# Patient Record
Sex: Male | Born: 1955 | Race: Black or African American | Hispanic: No | Marital: Single | Smoking: Current every day smoker
Health system: Southern US, Community
[De-identification: ages and names within clinical notes are randomized; demographics above are authoritative.]

## PROBLEM LIST (undated history)

## (undated) ENCOUNTER — Ambulatory Visit (HOSPITAL_COMMUNITY): Admission: EM | Disposition: A | Discharge status: Home / Self Care | Payer: 59

## (undated) DIAGNOSIS — Z89511 Acquired absence of right leg below knee: Secondary | ICD-10-CM

## (undated) DIAGNOSIS — F1010 Alcohol abuse, uncomplicated: Secondary | ICD-10-CM

## (undated) DIAGNOSIS — I82409 Acute embolism and thrombosis of unspecified deep veins of unspecified lower extremity: Secondary | ICD-10-CM

## (undated) DIAGNOSIS — I10 Essential (primary) hypertension: Secondary | ICD-10-CM

## (undated) DIAGNOSIS — E785 Hyperlipidemia, unspecified: Secondary | ICD-10-CM

## (undated) DIAGNOSIS — G40909 Epilepsy, unspecified, not intractable, without status epilepticus: Secondary | ICD-10-CM

## (undated) HISTORY — DX: Acquired absence of right leg below knee: Z89.511

## (undated) HISTORY — DX: Epilepsy, unspecified, not intractable, without status epilepticus: G40.909

## (undated) HISTORY — DX: Alcohol abuse, uncomplicated: F10.10

## (undated) HISTORY — PX: LEG AMPUTATION: SHX1105

## (undated) HISTORY — PX: BELOW KNEE LEG AMPUTATION: SUR23

---

## 2005-04-15 ENCOUNTER — Emergency Department: Payer: Self-pay | Admitting: Emergency Medicine

## 2006-01-12 ENCOUNTER — Emergency Department: Payer: Self-pay | Admitting: Emergency Medicine

## 2006-02-26 ENCOUNTER — Emergency Department (HOSPITAL_COMMUNITY): Admission: EM | Admit: 2006-02-26 | Discharge: 2006-02-26 | Payer: Self-pay | Admitting: Emergency Medicine

## 2006-03-07 ENCOUNTER — Emergency Department (HOSPITAL_COMMUNITY): Admission: EM | Admit: 2006-03-07 | Discharge: 2006-03-07 | Payer: Self-pay | Admitting: Emergency Medicine
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
## 2006-08-22 ENCOUNTER — Emergency Department: Payer: Self-pay | Admitting: Emergency Medicine

## 2009-05-10 ENCOUNTER — Inpatient Hospital Stay: Payer: Self-pay | Admitting: Vascular Surgery

## 2009-05-10 IMAGING — CR RIGHT ANKLE - 2 VIEW
1 series · 2 of 2 positions shown · non-contrast
Comparison: none

REASON FOR EXAM: ankle pain
COMMENTS:

[Series 1: view not recorded · 0.17mm/px · 2 of 2 slices shown]
[im 1/2]
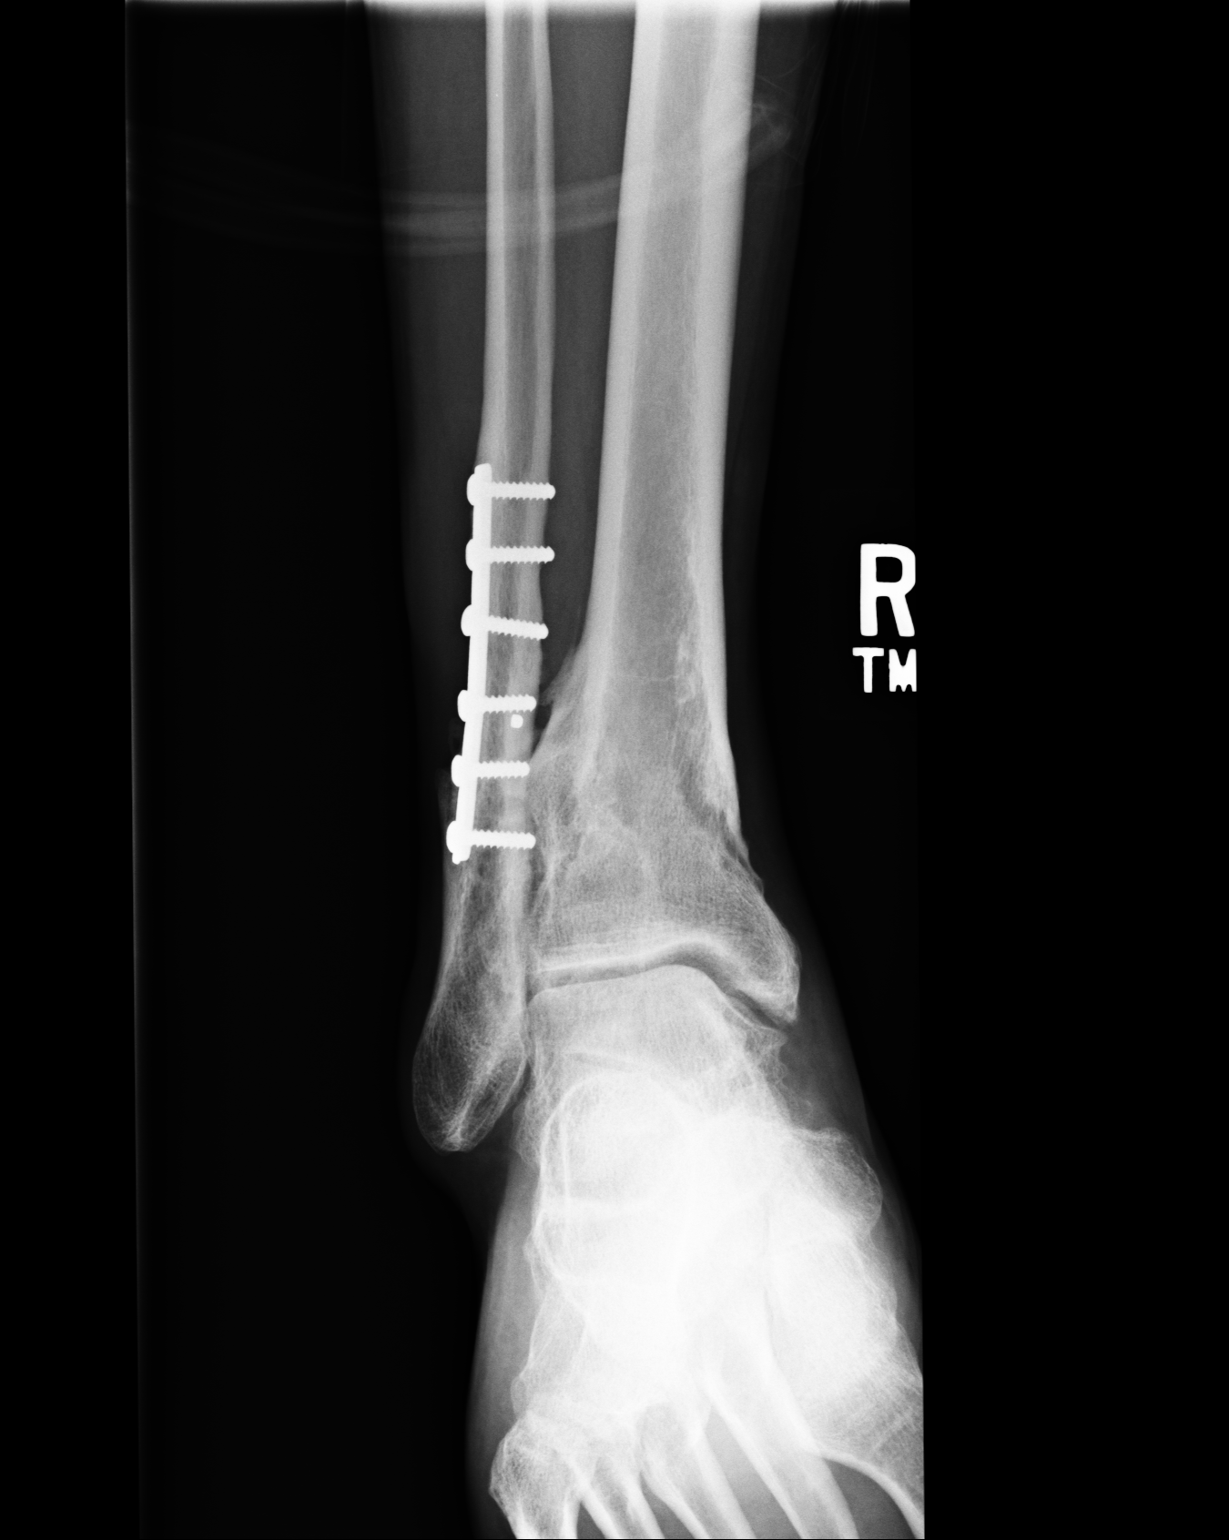
[im 2/2]
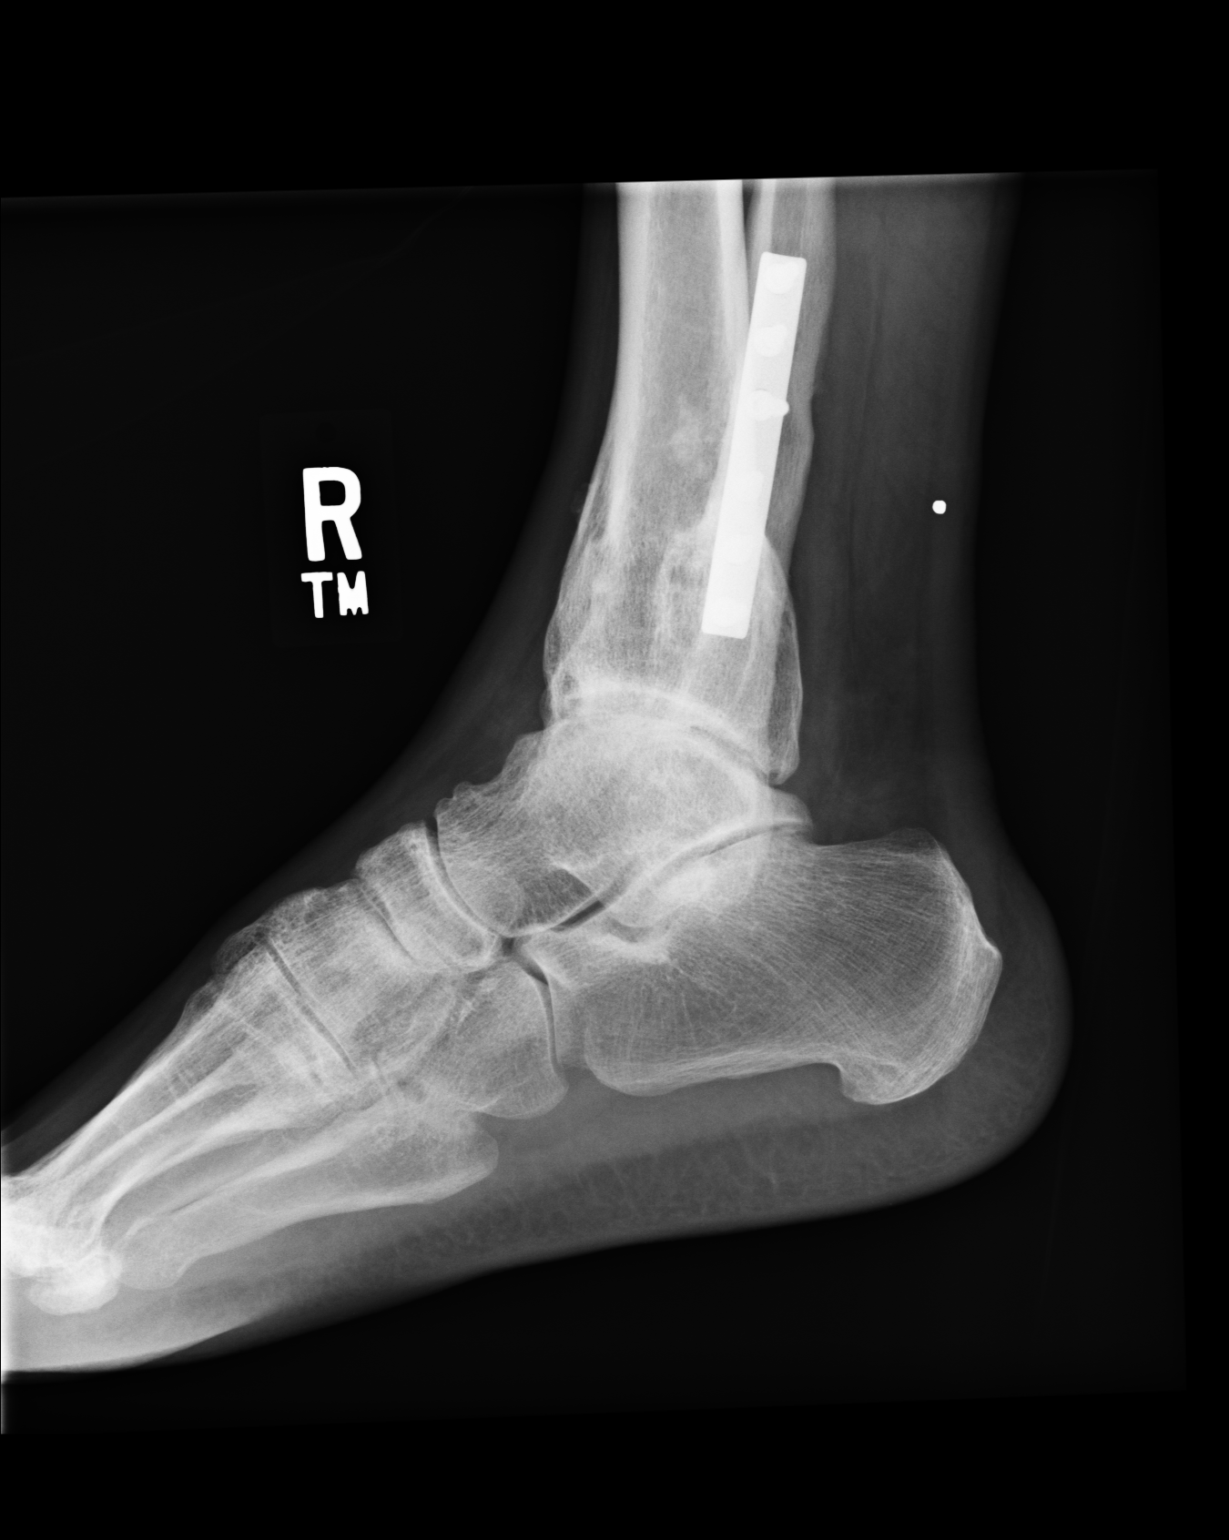

[2 of 2 positions shown; findings below may reference images not displayed]

PROCEDURE:     DXR - DXR ANKLE RIGHT AP AND LATERAL  - [DATE]  [DATE]

RESULT:     Images of the right ankle demonstrate previous fixation along
the lateral aspect of the distal fibula with a plate. There is an old
healing fracture in the distal right tibia. This is incompletely healed.
Degenerative changes are noted at the talotibial joint.
IMPRESSION: Post surgical and post traumatic changes as described.

## 2009-05-10 IMAGING — XA IR VASCULAR PROCEDURE
15 of 24 series · 15 of 24 positions shown · IV contrast (IODINE)
Comparison: none

[Series 1: aorta · 1 of 2 slices shown (1 of 11)]
[im 1/2]
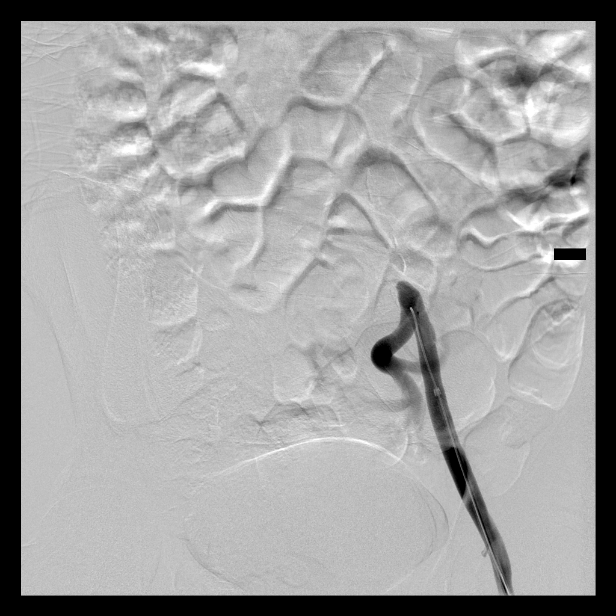

[Series 3: aorta · 1 of 4 slices shown (2 of 11)]
[im 1/4]
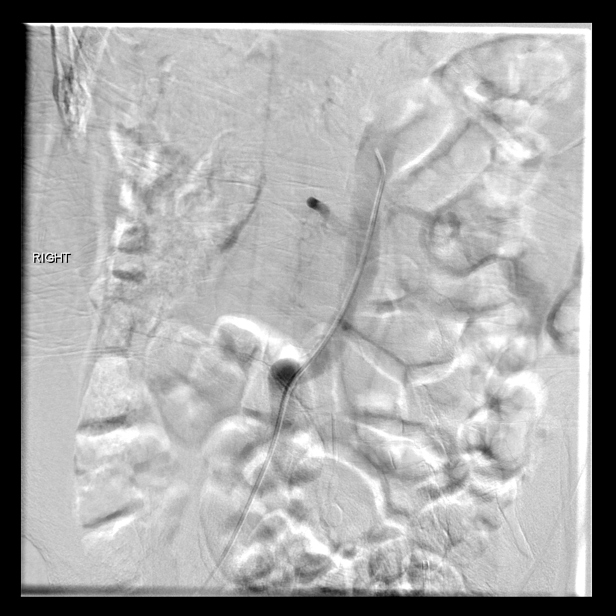

[Series 5: aorta · 1 of 4 slices shown (3 of 11)]
[im 1/4]
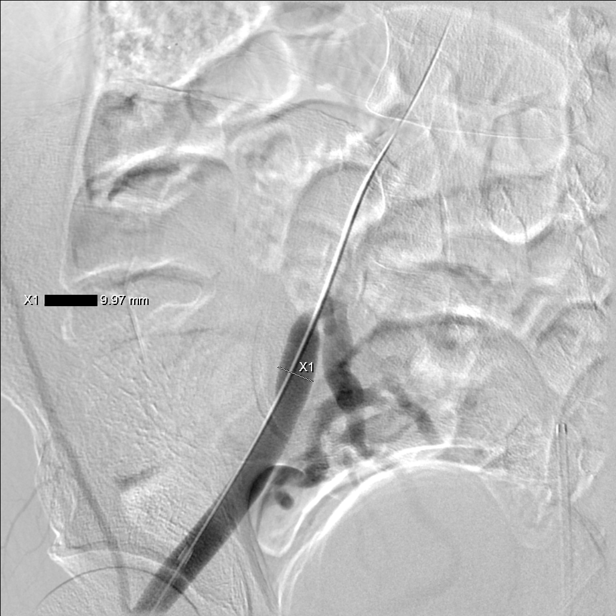

[Series 6: fl  angio · 1 of 2 slices shown (1 of 4)]
[im 1/2]
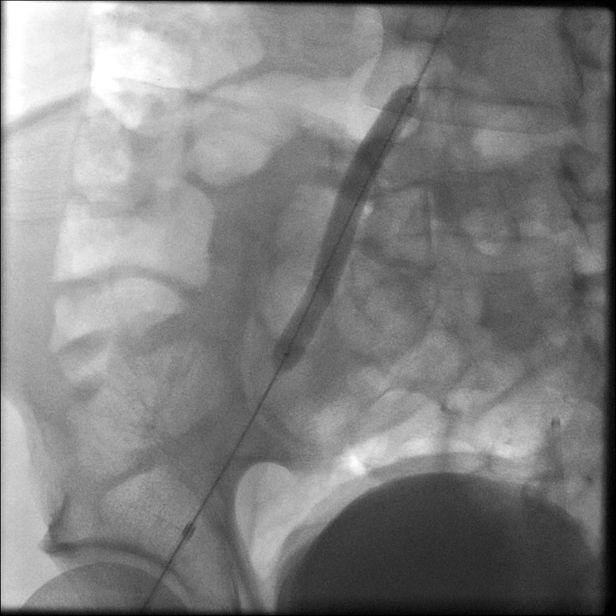

[Series 8: aorta · 1 of 2 slices shown (4 of 11)]
[im 1/2]
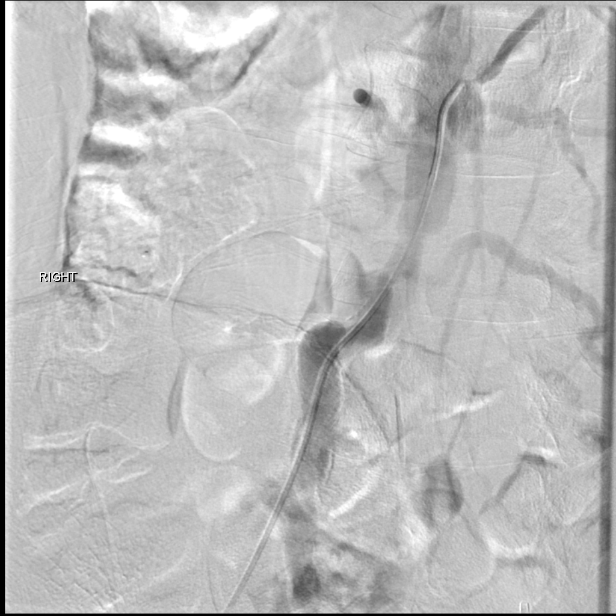

[Series 9: fl  angio · 1 of 1 slices shown (2 of 4)]
[im 1/1]
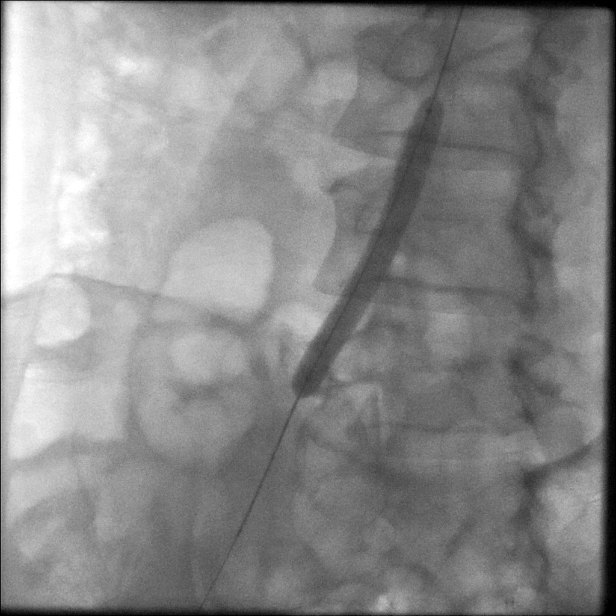

[Series 12: aorta · 1 of 2 slices shown (5 of 11)]
[im 1/2]
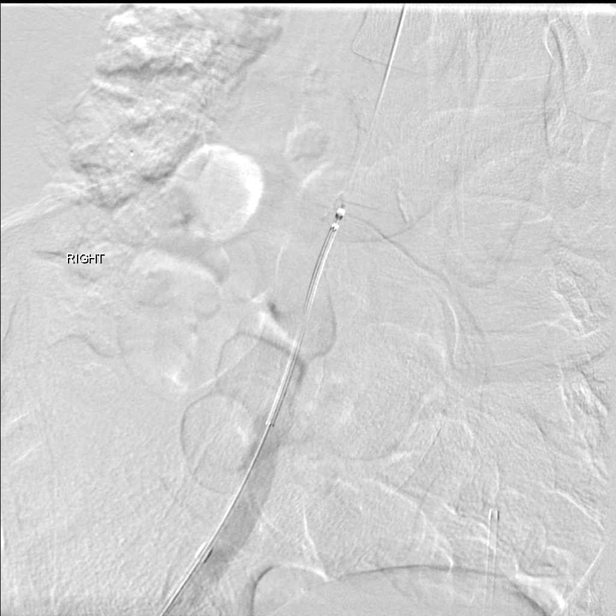

[Series 14: fl  angio · 1 of 1 slices shown (3 of 4)]
[im 1/1]
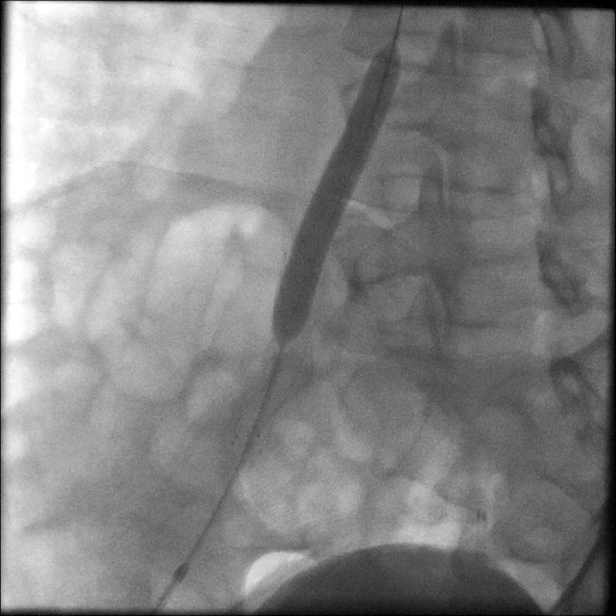

[Series 15: aorta · 1 of 2 slices shown (6 of 11)]
[im 1/2]
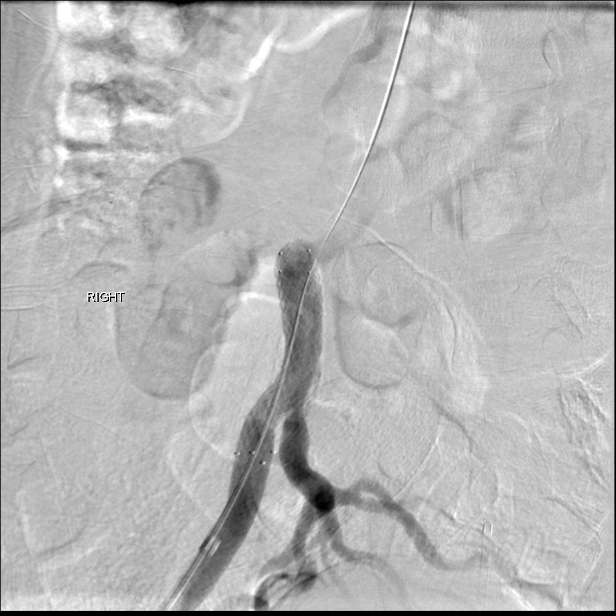

[Series 17: aorta · 1 of 2 slices shown (7 of 11)]
[im 1/2]
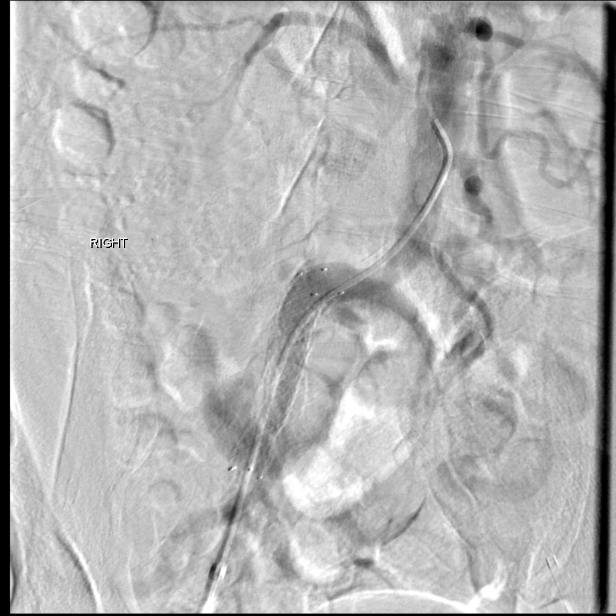

[Series 18: aorta · 1 of 2 slices shown (8 of 11)]
[im 1/2]
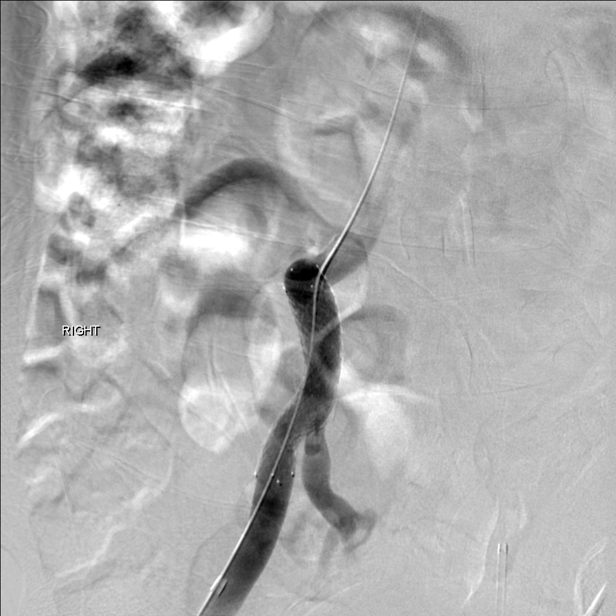

[Series 20: fl  angio · 1 of 1 slices shown (4 of 4)]
[im 1/1]
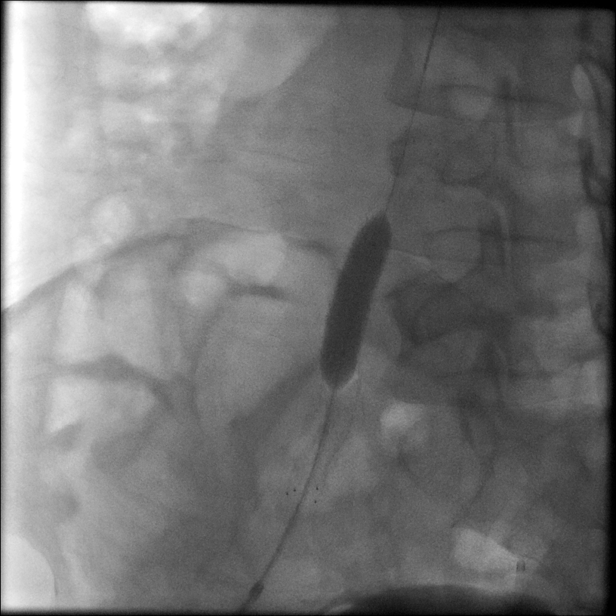

[Series 22: aorta · 1 of 2 slices shown (9 of 11)]
[im 1/2]
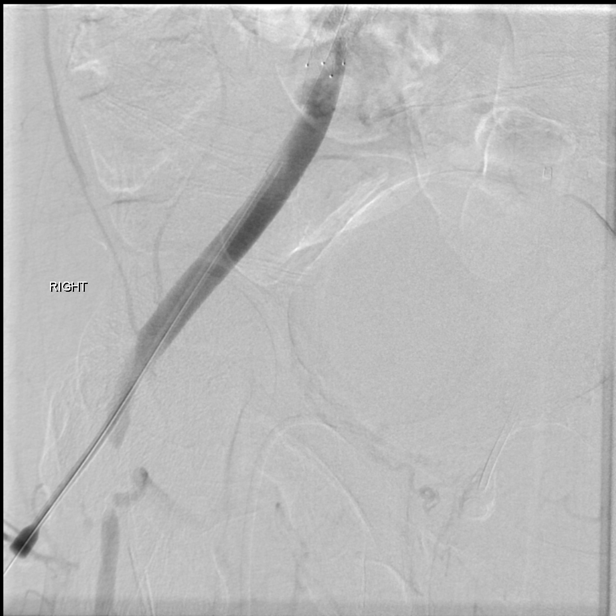

[Series 23: aorta · 1 of 2 slices shown (10 of 11)]
[im 1/2]
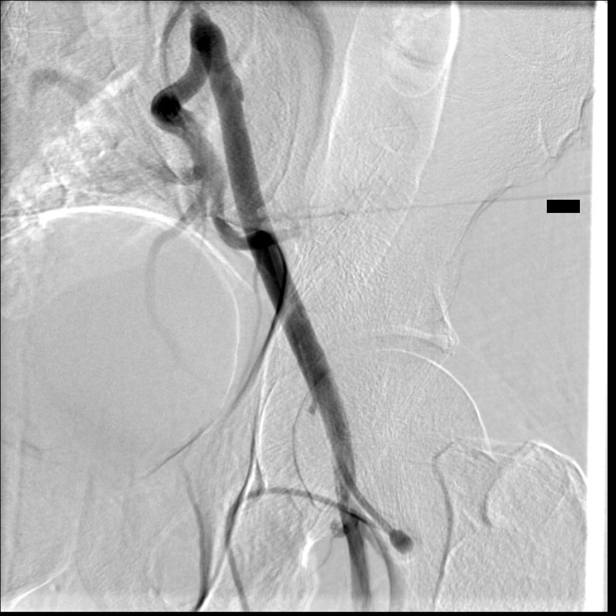

[Series 25: aorta · 1 of 2 slices shown (11 of 11)]
[im 1/2]
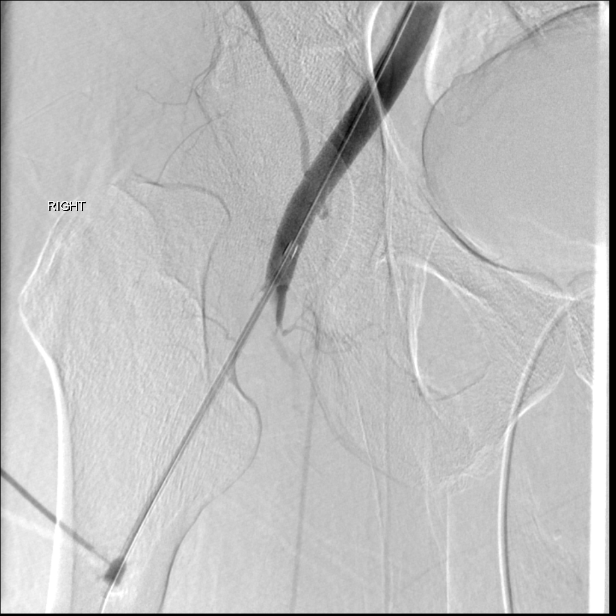

[15 of 24 positions shown; findings below may reference images not displayed]

IMAGES IMPORTED FROM THE [employer] WORKFLOW SYSTEM
NO DICTATION FOR STUDY

## 2010-03-10 ENCOUNTER — Emergency Department: Payer: Self-pay | Admitting: Unknown Physician Specialty

## 2010-03-10 IMAGING — CR DG CHEST 1V PORT
1 series · 1 of 1 positions shown · non-contrast
Comparison: none

REASON FOR EXAM: cough, crackles diffusely
COMMENTS:

[view not recorded]
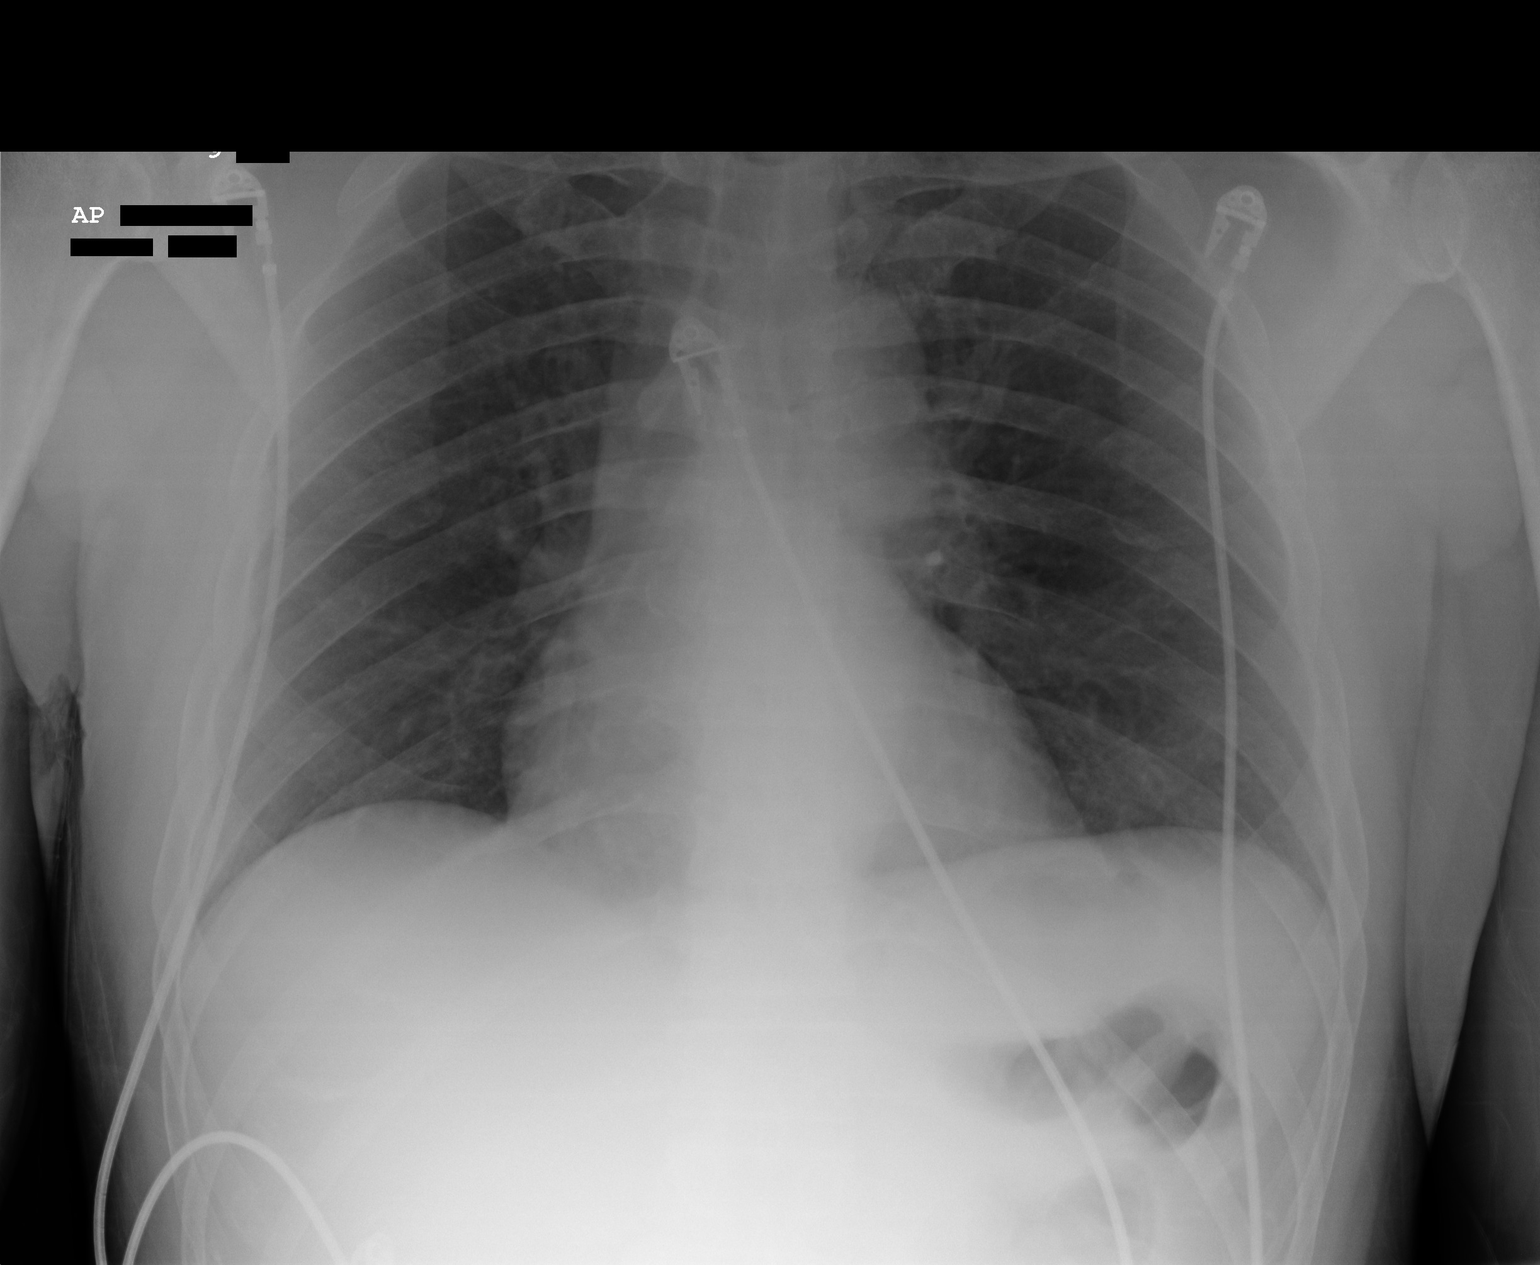

[1 of 1 positions shown; findings below may reference images not displayed]

PROCEDURE:     DXR - DXR PORTABLE CHEST SINGLE VIEW  - [DATE]  [DATE]

RESULT:     There is no previous exam for comparison.

Cardiac monitoring electrodes are present. The lungs are clear. The heart
and pulmonary vessels are normal. The bony and mediastinal structures are
unremarkable. There is no effusion. There is no pneumothorax or evidence of
congestive failure.
IMPRESSION: No acute cardiopulmonary disease.

## 2011-07-29 ENCOUNTER — Emergency Department: Payer: Self-pay | Admitting: Emergency Medicine

## 2011-07-29 LAB — COMPREHENSIVE METABOLIC PANEL WITH GFR
Albumin: 3.6 g/dL
Alkaline Phosphatase: 102 U/L
Anion Gap: 9
BUN: 5 mg/dL — ABNORMAL LOW
Bilirubin,Total: 0.1 mg/dL — ABNORMAL LOW
Calcium, Total: 8 mg/dL — ABNORMAL LOW
Chloride: 106 mmol/L
Co2: 27 mmol/L
Creatinine: 0.94 mg/dL
EGFR (African American): >60
EGFR (Non-African Amer.): >60
Glucose: 108 mg/dL — ABNORMAL HIGH
Osmolality: 281
Potassium: 3.7 mmol/L
SGOT(AST): 50 U/L — ABNORMAL HIGH
SGPT (ALT): 47 U/L
Sodium: 142 mmol/L
Total Protein: 8 g/dL

## 2011-07-29 LAB — ETHANOL
Ethanol %: 0.389 % (ref 0.000–0.080)
Ethanol: 389 mg/dL

## 2011-07-29 LAB — DRUG SCREEN, URINE
Amphetamines, Ur Screen: NEGATIVE
Barbiturates, Ur Screen: NEGATIVE
Benzodiazepine, Ur Scrn: NEGATIVE
Cannabinoid 50 Ng, Ur ~~LOC~~: NEGATIVE
Cocaine Metabolite,Ur ~~LOC~~: NEGATIVE
MDMA (Ecstasy)Ur Screen: NEGATIVE
Methadone, Ur Screen: NEGATIVE
Opiate, Ur Screen: NEGATIVE
Phencyclidine (PCP) Ur S: NEGATIVE
Tricyclic, Ur Screen: NEGATIVE

## 2011-07-29 LAB — ACETAMINOPHEN LEVEL: Acetaminophen: <2 ug/mL

## 2011-07-29 LAB — CBC
HCT: 46.5 % (ref 40.0–52.0)
HGB: 15.4 g/dL (ref 13.0–18.0)
MCV: 94 fL (ref 80–100)
RBC: 4.93 10*6/uL (ref 4.40–5.90)
RDW: 15.5 % — ABNORMAL HIGH (ref 11.5–14.5)

## 2011-07-29 LAB — TSH: Thyroid Stimulating Horm: 1.12 u[IU]/mL

## 2011-12-17 ENCOUNTER — Ambulatory Visit: Payer: Medicare Other | Attending: Psychiatry | Admitting: Physical Therapy

## 2011-12-17 DIAGNOSIS — R269 Unspecified abnormalities of gait and mobility: Secondary | ICD-10-CM | POA: Insufficient documentation

## 2011-12-17 DIAGNOSIS — S88119A Complete traumatic amputation at level between knee and ankle, unspecified lower leg, initial encounter: Secondary | ICD-10-CM | POA: Insufficient documentation

## 2011-12-17 DIAGNOSIS — IMO0001 Reserved for inherently not codable concepts without codable children: Secondary | ICD-10-CM | POA: Insufficient documentation

## 2011-12-17 DIAGNOSIS — R5381 Other malaise: Secondary | ICD-10-CM | POA: Insufficient documentation

## 2016-03-02 ENCOUNTER — Encounter (HOSPITAL_COMMUNITY): Payer: Self-pay | Admitting: Radiology

## 2016-03-02 ENCOUNTER — Emergency Department (HOSPITAL_COMMUNITY)
Admission: EM | Admit: 2016-03-02 | Discharge: 2016-03-02 | Disposition: A | Discharge status: Home / Self Care | Payer: Medicare Other | Attending: Emergency Medicine | Admitting: Emergency Medicine

## 2016-03-02 ENCOUNTER — Emergency Department (HOSPITAL_COMMUNITY): Payer: Medicare Other

## 2016-03-02 ENCOUNTER — Ambulatory Visit (HOSPITAL_COMMUNITY)
Admission: EM | Admit: 2016-03-02 | Discharge: 2016-03-02 | Disposition: A | Discharge status: Nursing Home (Includes ICF) | Payer: Medicare Other | Attending: Family Medicine | Admitting: Family Medicine

## 2016-03-02 ENCOUNTER — Encounter (HOSPITAL_COMMUNITY): Payer: Self-pay | Admitting: *Deleted

## 2016-03-02 DIAGNOSIS — I169 Hypertensive crisis, unspecified: Secondary | ICD-10-CM | POA: Diagnosis not present

## 2016-03-02 DIAGNOSIS — M545 Low back pain, unspecified: Secondary | ICD-10-CM

## 2016-03-02 DIAGNOSIS — M549 Dorsalgia, unspecified: Secondary | ICD-10-CM

## 2016-03-02 DIAGNOSIS — I639 Cerebral infarction, unspecified: Secondary | ICD-10-CM | POA: Diagnosis not present

## 2016-03-02 DIAGNOSIS — I10 Essential (primary) hypertension: Secondary | ICD-10-CM | POA: Diagnosis not present

## 2016-03-02 DIAGNOSIS — Z79899 Other long term (current) drug therapy: Secondary | ICD-10-CM | POA: Diagnosis not present

## 2016-03-02 HISTORY — DX: Essential (primary) hypertension: I10

## 2016-03-02 LAB — URINALYSIS, ROUTINE W REFLEX MICROSCOPIC
BILIRUBIN URINE: NEGATIVE
Glucose, UA: NEGATIVE mg/dL
Hgb urine dipstick: NEGATIVE
KETONES UR: NEGATIVE mg/dL
NITRITE: NEGATIVE
PROTEIN: NEGATIVE mg/dL
Specific Gravity, Urine: 1.022 (ref 1.005–1.030)
pH: 7.5 (ref 5.0–8.0)

## 2016-03-02 LAB — ETHANOL: Alcohol, Ethyl (B): <5 mg/dL (ref <5)

## 2016-03-02 LAB — COMPREHENSIVE METABOLIC PANEL
ALBUMIN: 3.7 g/dL (ref 3.5–5.0)
ALT: 27 U/L (ref 17–63)
AST: 33 U/L (ref 15–41)
Alkaline Phosphatase: 68 U/L (ref 38–126)
Anion gap: 10 (ref 5–15)
BUN: 7 mg/dL (ref 6–20)
CHLORIDE: 99 mmol/L — AB (ref 101–111)
CO2: 22 mmol/L (ref 22–32)
CREATININE: 0.96 mg/dL (ref 0.61–1.24)
Calcium: 8.4 mg/dL — ABNORMAL LOW (ref 8.9–10.3)
GFR calc Af Amer: >60 mL/min (ref >60)
GFR calc non Af Amer: >60 mL/min (ref >60)
GLUCOSE: 94 mg/dL (ref 65–99)
POTASSIUM: 3.8 mmol/L (ref 3.5–5.1)
SODIUM: 131 mmol/L — AB (ref 135–145)
Total Bilirubin: 0.4 mg/dL (ref 0.3–1.2)
Total Protein: 6.7 g/dL (ref 6.5–8.1)

## 2016-03-02 LAB — CBC
HEMATOCRIT: 44.7 % (ref 39.0–52.0)
Hemoglobin: 15.2 g/dL (ref 13.0–17.0)
MCH: 30.6 pg (ref 26.0–34.0)
MCHC: 34 g/dL (ref 30.0–36.0)
MCV: 89.9 fL (ref 78.0–100.0)
Platelets: 217 10*3/uL (ref 150–400)
RBC: 4.97 MIL/uL (ref 4.22–5.81)
RDW: 14.6 % (ref 11.5–15.5)
WBC: 7.7 10*3/uL (ref 4.0–10.5)

## 2016-03-02 LAB — RAPID URINE DRUG SCREEN, HOSP PERFORMED
AMPHETAMINES: NOT DETECTED
BARBITURATES: NOT DETECTED
Benzodiazepines: NOT DETECTED
Cocaine: NOT DETECTED
Opiates: NOT DETECTED
TETRAHYDROCANNABINOL: NOT DETECTED

## 2016-03-02 LAB — DIFFERENTIAL
BASOS ABS: 0 10*3/uL (ref 0.0–0.1)
BASOS PCT: 0 %
EOS ABS: 0 10*3/uL (ref 0.0–0.7)
Eosinophils Relative: 0 %
Lymphocytes Relative: 22 %
Lymphs Abs: 1.7 10*3/uL (ref 0.7–4.0)
Monocytes Absolute: 0.8 10*3/uL (ref 0.1–1.0)
Monocytes Relative: 10 %
NEUTROS ABS: 5.2 10*3/uL (ref 1.7–7.7)
NEUTROS PCT: 68 %

## 2016-03-02 LAB — URINE MICROSCOPIC-ADD ON: Bacteria, UA: NONE SEEN

## 2016-03-02 LAB — PROTIME-INR
INR: 0.98
Prothrombin Time: 13 seconds (ref 11.4–15.2)

## 2016-03-02 LAB — APTT: APTT: 30 s (ref 24–36)

## 2016-03-02 IMAGING — DX DG CHEST 2V
2 series · 2 of 2 positions shown · non-contrast
Comparison: Chest x-ray dated [DATE].

CLINICAL DATA: Pt complains of left sided thoracic back pain onset
this AM; mild cough; he reports h/o HTN and states he is borderline
diabetic; smoker

EXAM:
CHEST  2 VIEW

[w chest pa]
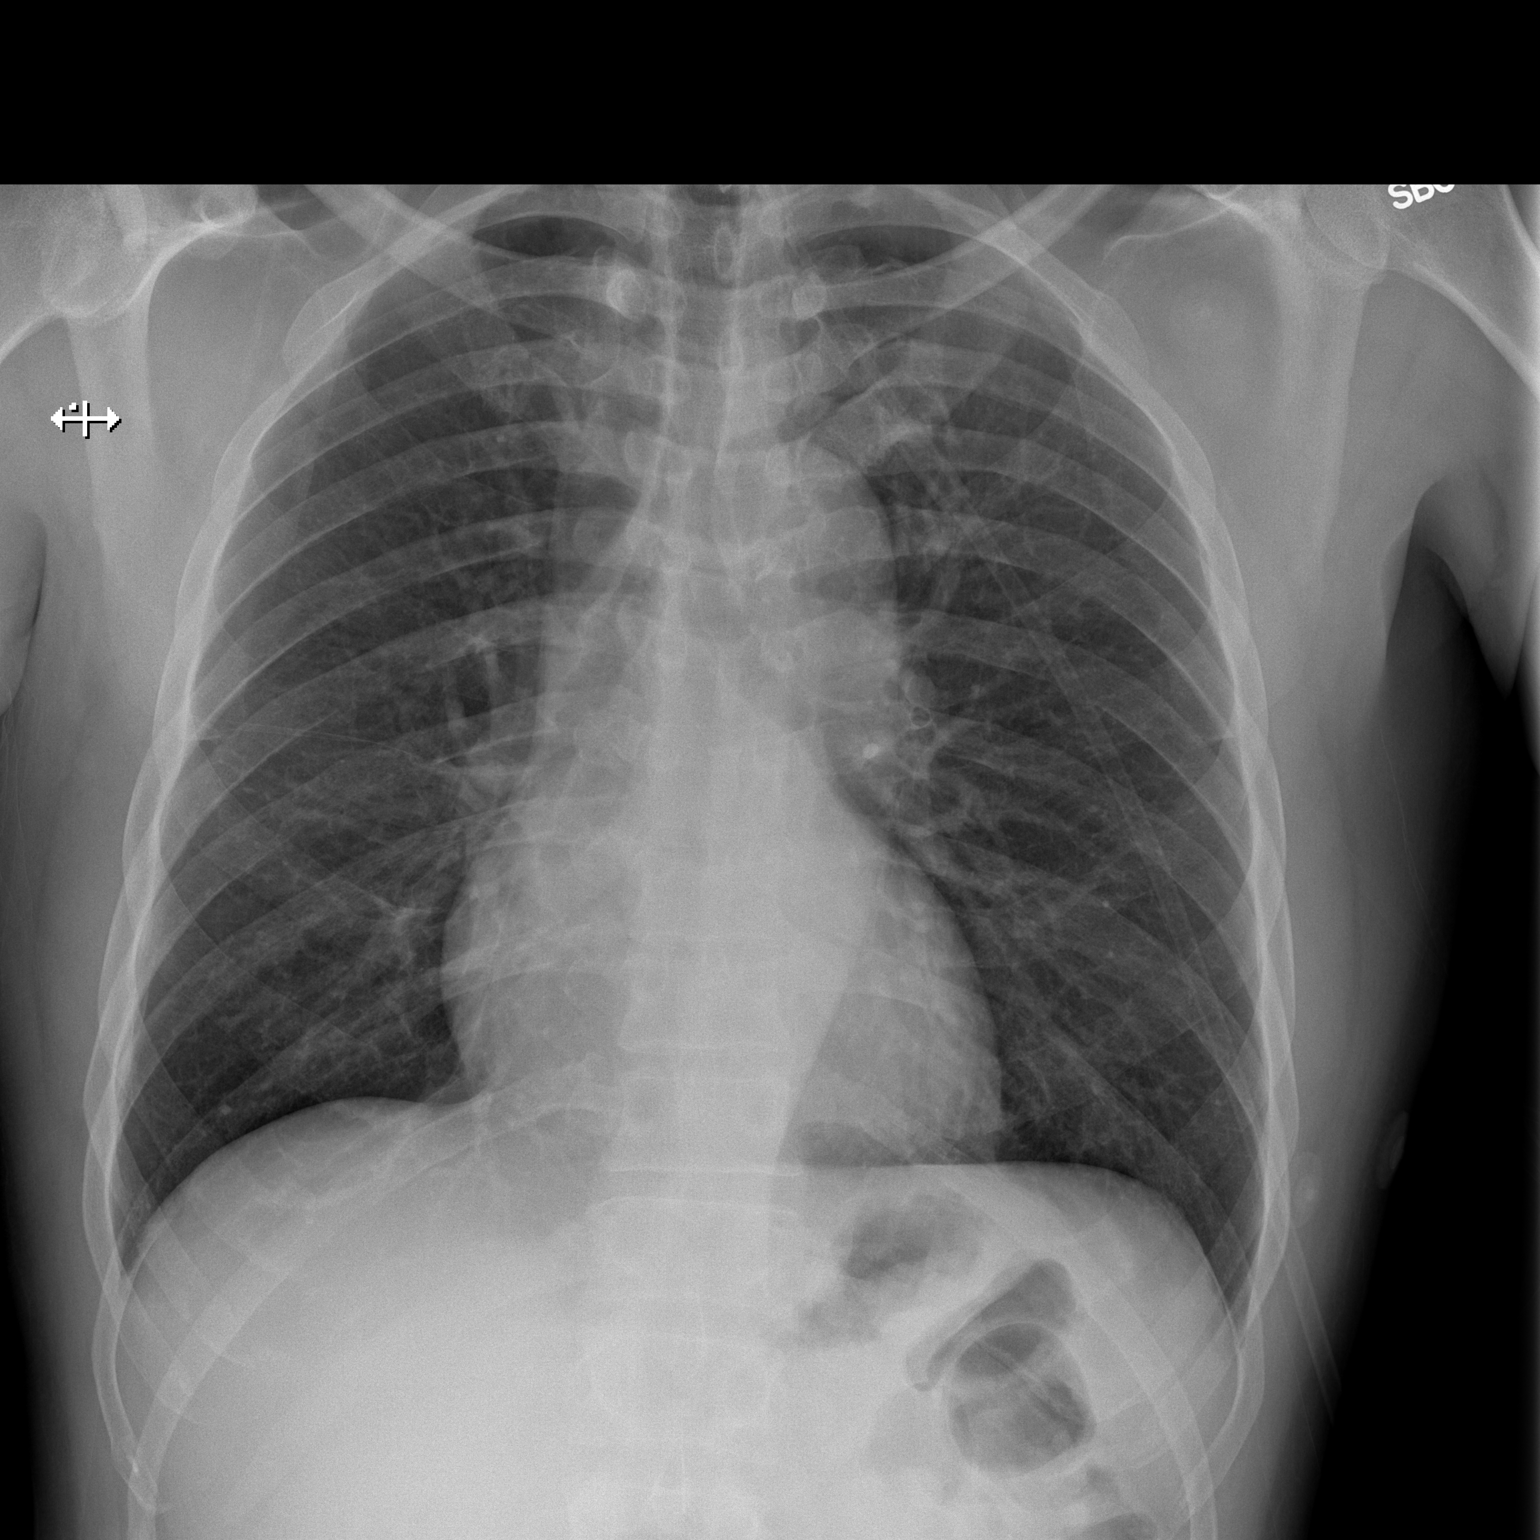

[w chest lat]
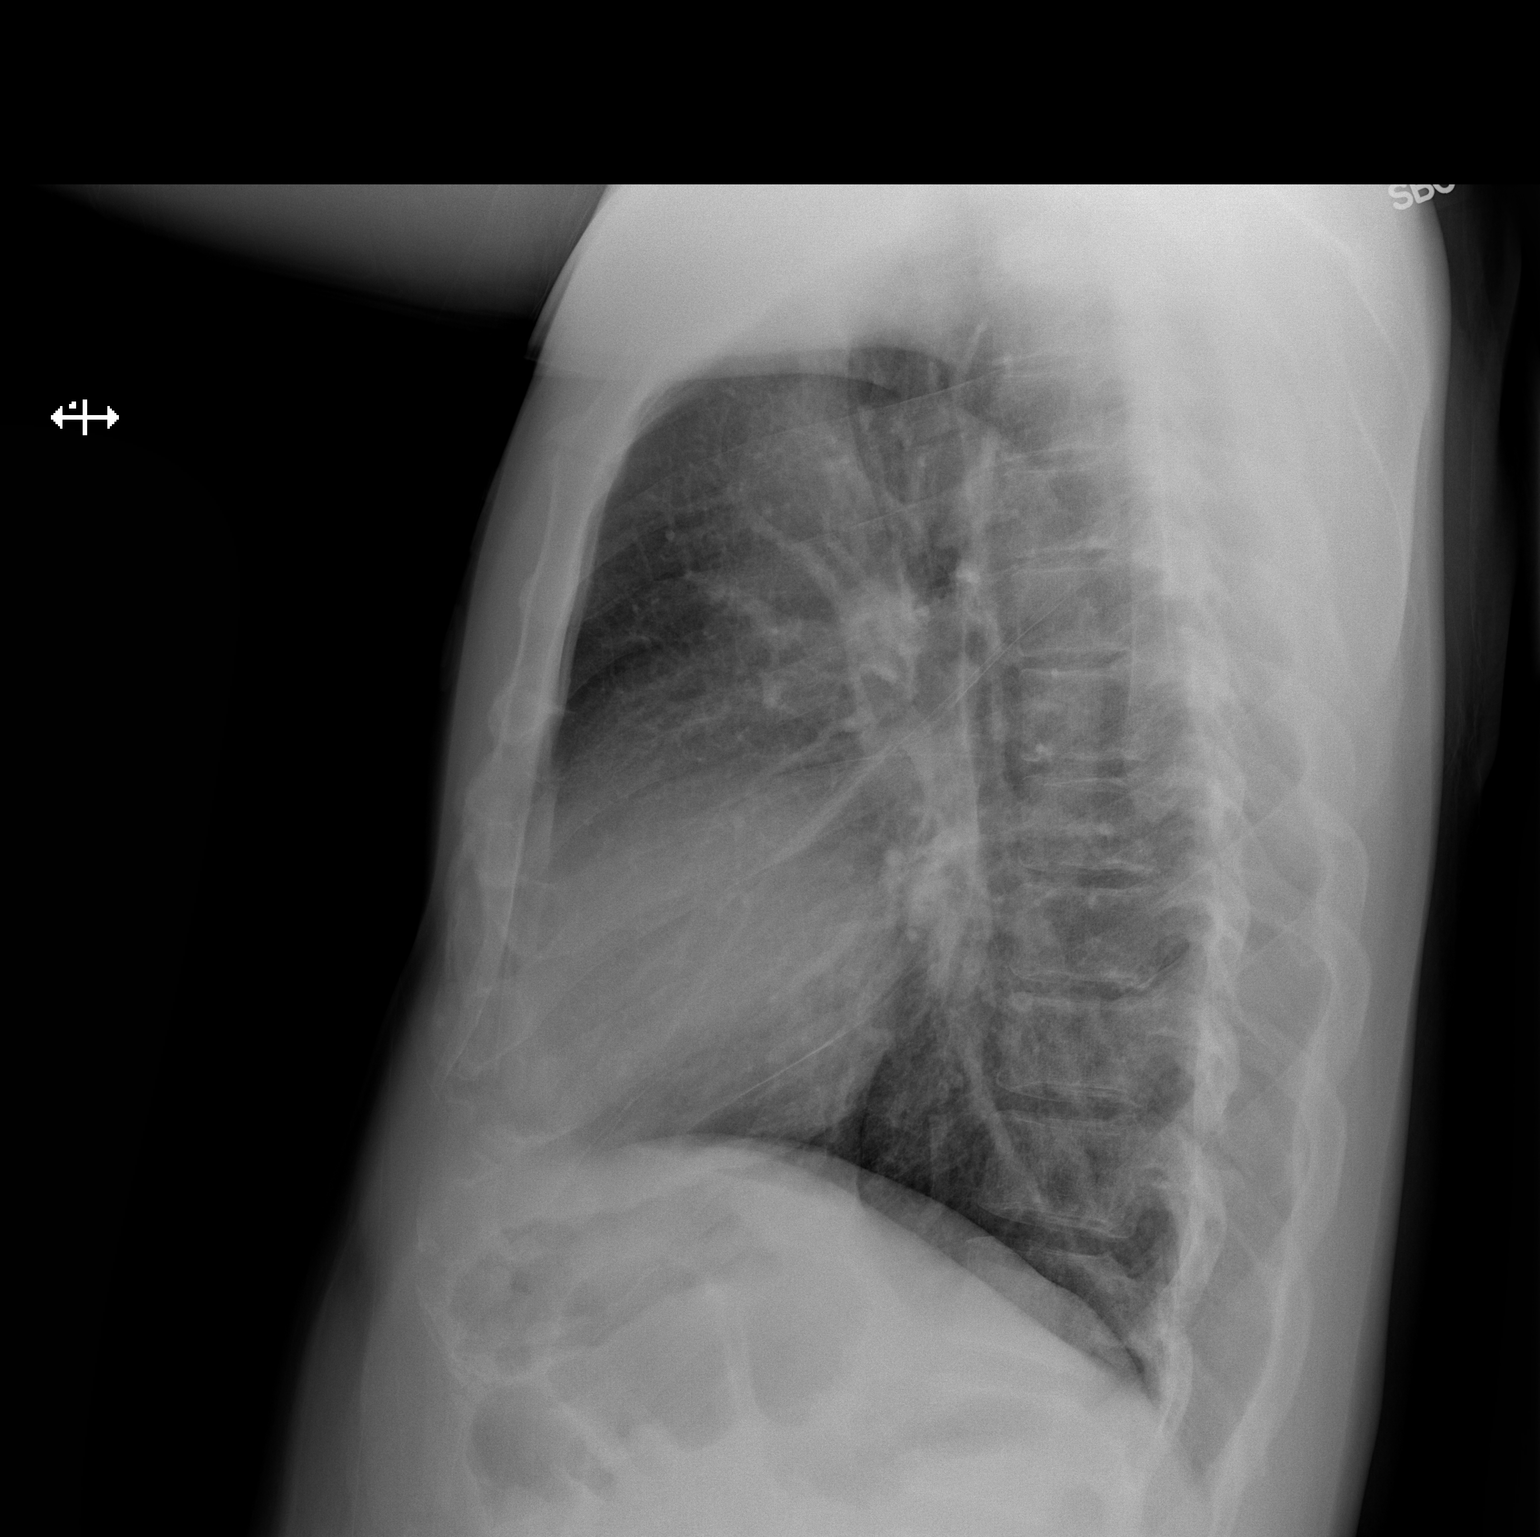

[2 of 2 positions shown; findings below may reference images not displayed]

FINDINGS: Heart size is normal. Overall cardiomediastinal silhouette appears
stable in size and configuration. Lungs are clear. Lung volumes are
within normal limits. No pleural effusion or pneumothorax seen.
Osseous structures about the chest are unremarkable.
IMPRESSION: No active cardiopulmonary disease. No evidence of pneumonia or
pulmonary edema.

## 2016-03-02 IMAGING — CT CT ANGIO HEAD
2 of 7 series · 8 of 33 positions shown · IV contrast (OMNI 350)
Comparison: Head CT without contrast [RV] hours today.

CLINICAL DATA: 60-year-old male code stroke. Left side weakness and
difficulty walking. Initial encounter.

EXAM:
CT ANGIOGRAPHY HEAD AND NECK
TECHNIQUE: Multidetector CT imaging of the head and neck was performed using
the standard protocol during bolus administration of intravenous
contrast. Multiplanar CT image reconstructions and MIPs were
obtained to evaluate the vascular anatomy. Carotid stenosis
measurements (when applicable) are obtained utilizing NASCET
criteria, using the distal internal carotid diameter as the
denominator.
CONTRAST:  50 mL Isovue 370

[Series 4: cta neck · axial · 0.47mm/px · z∈[-269,-143]mm · 2 of 189 slices shown]
[im 63/189  soft-tissue]
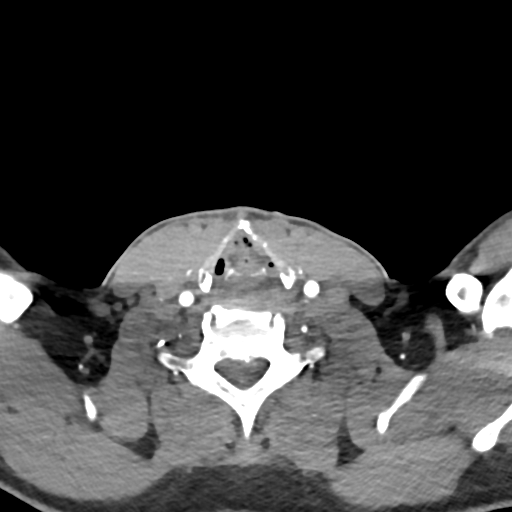
[im 126/189  soft-tissue]
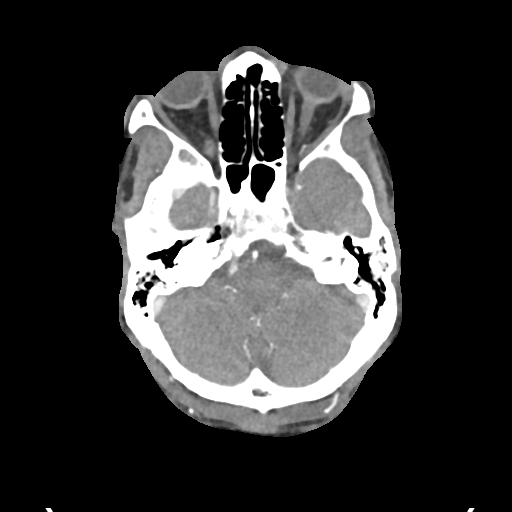

[Series 6: cta neck axial · axial · 0.39mm/px · z∈[-339,-71]mm · 6 of 376 slices shown]
[im 54/376  soft-tissue]
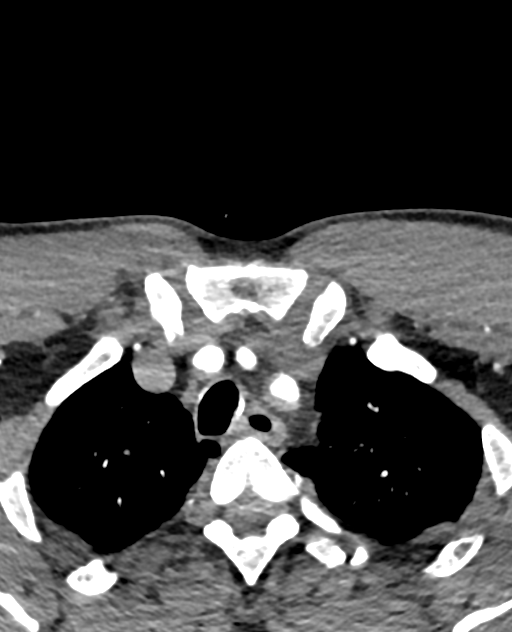
[im 108/376  bone]
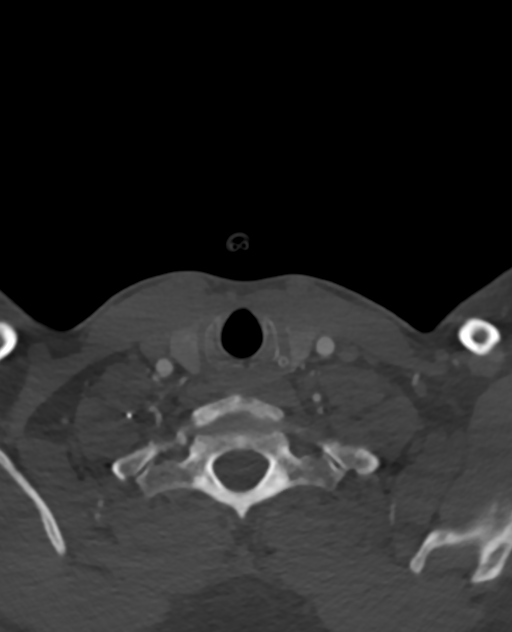
[im 161/376  soft-tissue]
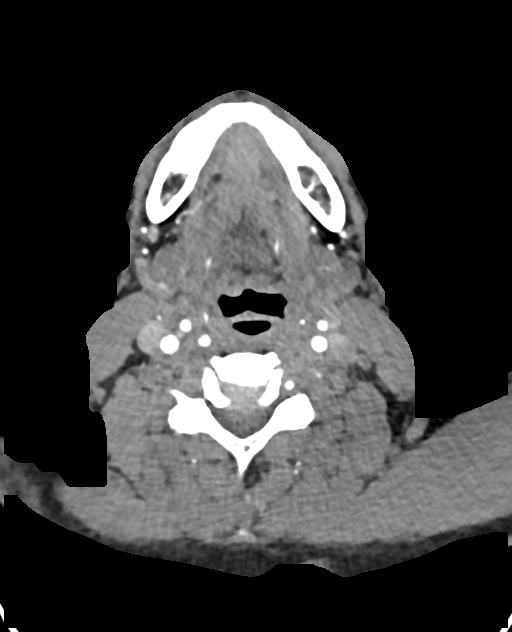
[im 215/376  bone]
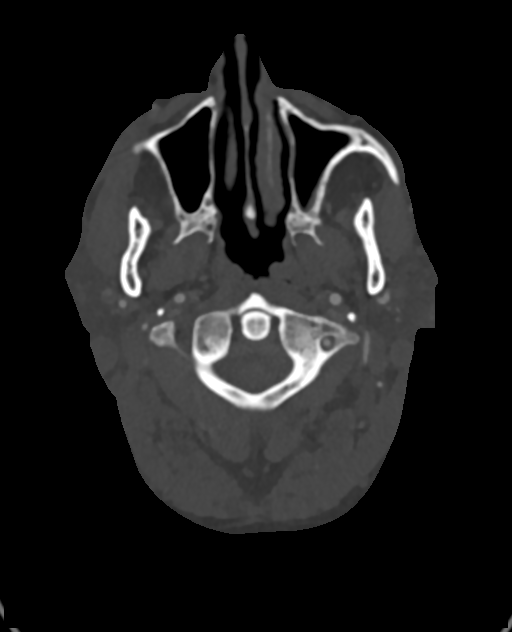
[im 268/376  soft-tissue]
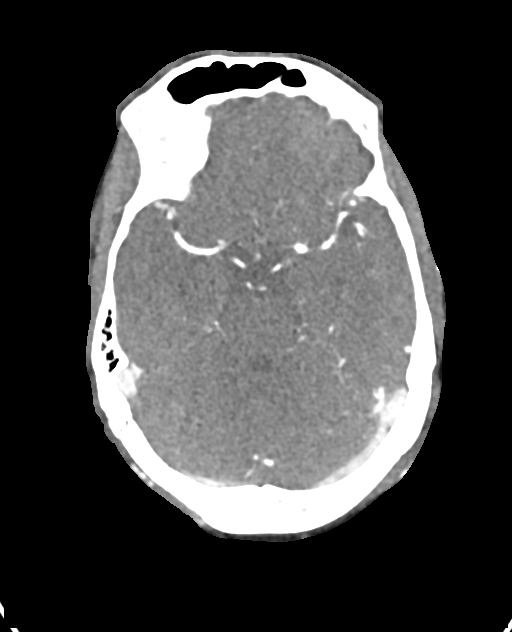
[im 322/376  bone]
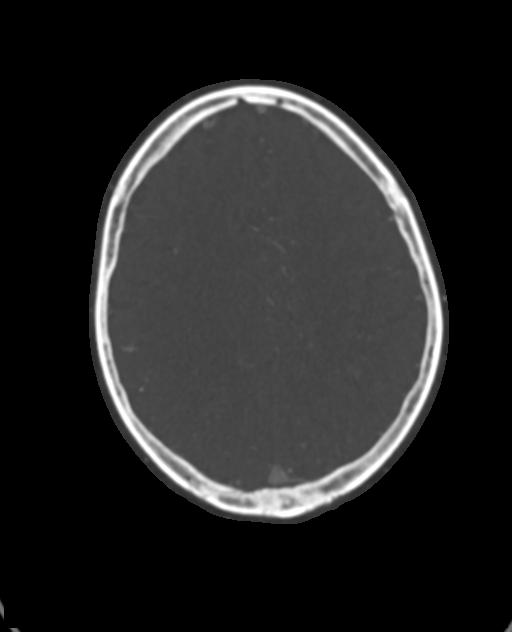

[8 of 33 positions shown; findings below may reference images not displayed]

FINDINGS: CTA NECK

Skeleton: Absent dentition. Degenerative changes in the cervical
spine. No acute osseous abnormality identified.

Upper chest: Negative lung apices, mild dependent atelectasis. No
superior mediastinal lymphadenopathy.

Other neck: Negative thyroid, larynx (glottis is closed), pharynx,
parapharyngeal spaces, retropharyngeal space, sublingual space,
submandibular glands and parotid glands. No cervical
lymphadenopathy.

Aortic arch: 3 vessel arch configuration. Mild calcified arch
atherosclerosis.

Right carotid system: Minimal soft and calcified brachiocephalic
artery atherosclerosis without stenosis. No right CCA origin
stenosis. Mildly tortuous proximal right CCA. Negative right carotid
bifurcation and cervical right ICA.

Left carotid system: Negative.

Vertebral arteries:No proximal right subclavian artery stenosis. The
right vertebral artery is occluded at its origin, and throughout the
neck except for a brief focus of reconstitution in the distal V2
segment at the C3 vertebral level (series 4, image 95).

Soft plaque in the proximal left subclavian artery without stenosis.
The left vertebral artery origin is shared with the origin of the
muscular branch (series 4, image 137). No left vertebral origin
stenosis. No left vertebral stenosis to the skullbase.

CTA HEAD

Posterior circulation: The distal right vertebral artery is occluded
proximal to the right PICA origin. It appears there is
reconstitution of the distal right V4 segment from the
vertebrobasilar junction. The right PICA origin is patent. The
distal left vertebral artery is patent, although somewhat diminutive
beyond the patent left PICA origin. The basilar artery is patent.
The basilar artery is diminutive, and there are fetal type bilateral
PCA origins. Both SCA origins are patent. Bilateral PCA branches are
within normal limits.

Anterior circulation: Both ICA siphons are patent, the right is
negative with un normal right posterior communicating artery origin.
There is calcified plaque in the left siphon at the distal cavernous
segment which does not appear hemodynamically significant. Normal
left posterior communicating artery origin. Patent carotid termini.
Normal MCA origins.

The right ACA A1 segment is diminutive or absent. Normal left A1,
anterior communicating artery, and bilateral ACA branches.

Left MCA M1 segment, bifurcation, and left MCA branches are within
normal limits. Right MCA M1 segment, bifurcation, and right MCA
branches are within normal limits.

Venous sinuses: Patent.

Anatomic variants: Dominant left ACA A1 segment, the right is
diminutive or absent. Fetal type bilateral PCA origins.

Review of the MIP images confirms the above findings
IMPRESSION: 1. Negative for anterior circulation large vessel stenosis or
occlusion. Normal cervical carotids. Mild left ICA siphon calcified
atherosclerosis.
2. Age indeterminate right vertebral artery occlusion. The distal
right V4 segment and right PICA are supplied in a retrograde fashion
from the vertebrobasilar junction. No left vertebral artery
stenosis. Diminutive basilar artery on the basis of fetal type PCA
origins.
3. Preliminary report of the above discussed by telephone with Dr.
NKOSIYEZWE at [RV] hours on [DATE].

## 2016-03-02 IMAGING — CT CT HEAD CODE STROKE
3 series · 15 of 47 positions shown, 18 images · non-contrast
Comparison: Head CT without contrast [DATE].

CLINICAL DATA: Code stroke. 60-year-old male with left side
weakness and difficulty walking. Last seen normal [AH] hours.
Initial encounter.

EXAM:
CT HEAD WITHOUT CONTRAST
TECHNIQUE: Contiguous axial images were obtained from the base of the skull
through the vertex without intravenous contrast.

[Series 2: head 5.0 st · axial · 0.45mm/px · z∈[-115,+10]mm · 9 of 31 slices shown, 12 images]
[im 3/31  brain]
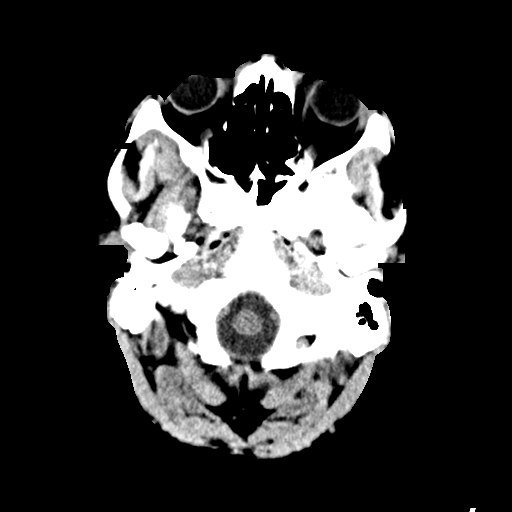
[im 3/31  bone]
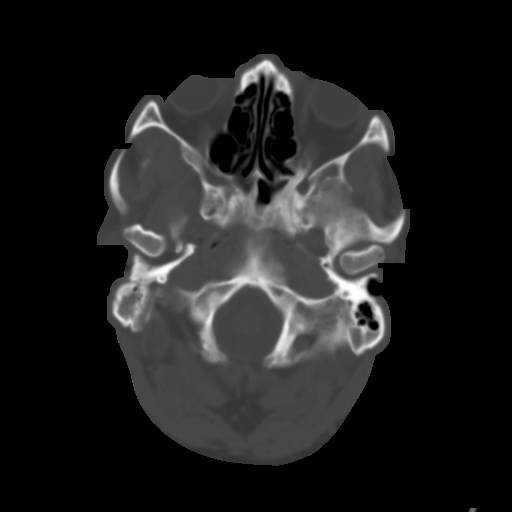
[im 6/31  brain]
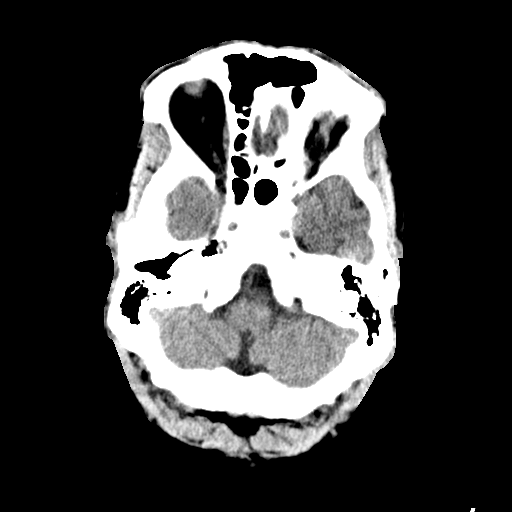
[im 9/31  brain]
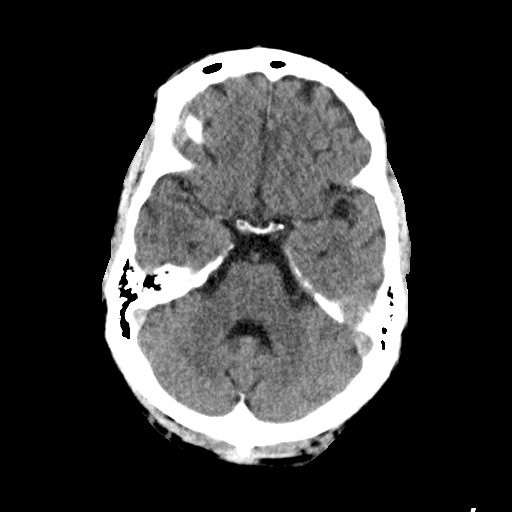
[im 12/31  brain]
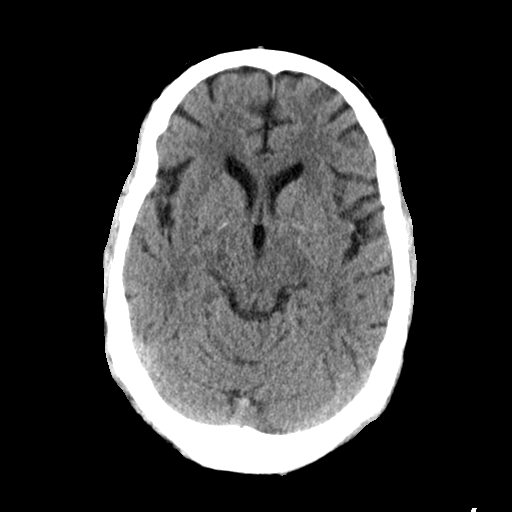
[im 16/31  brain]
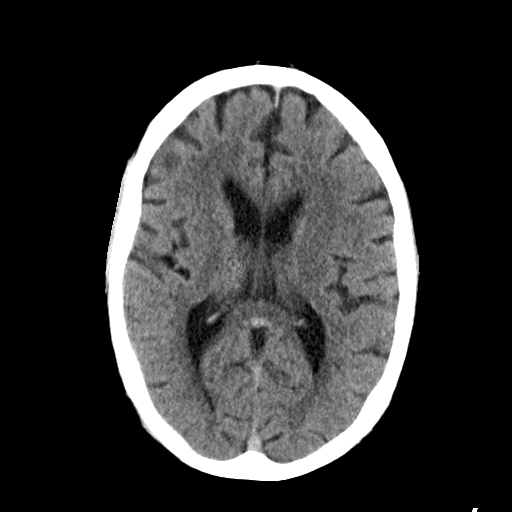
[im 16/31  bone]
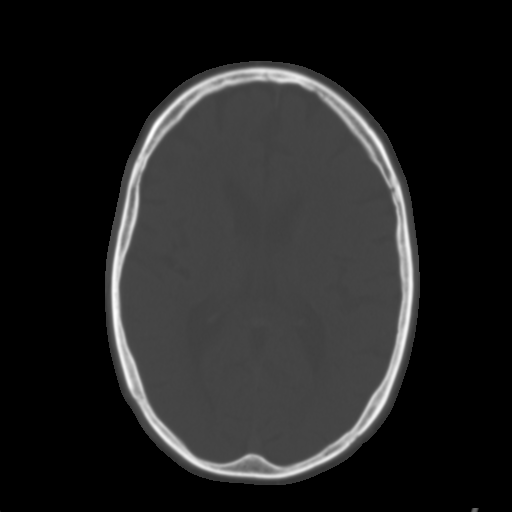
[im 19/31  brain]
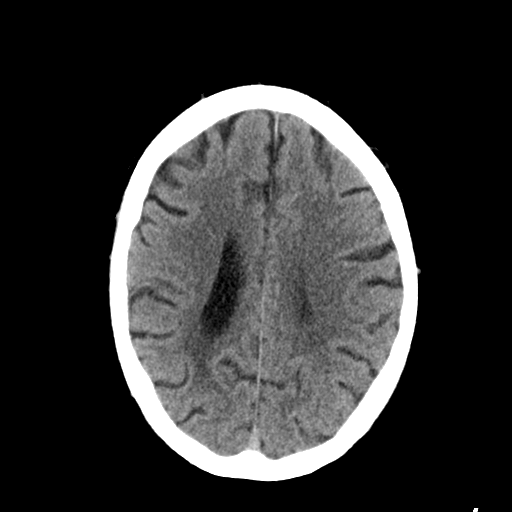
[im 22/31  brain]
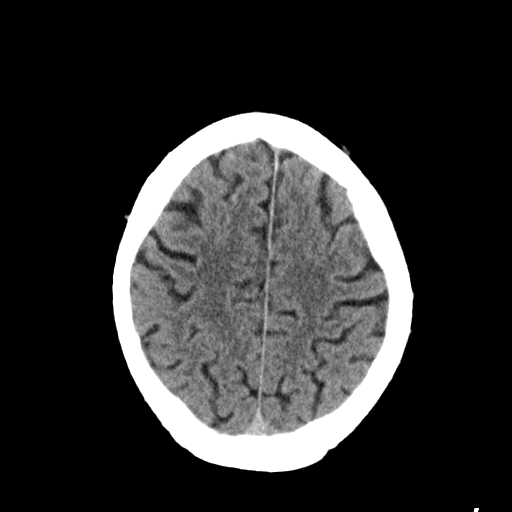
[im 25/31  brain]
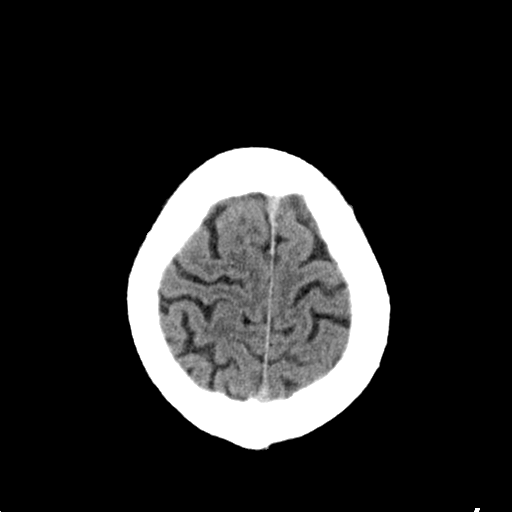
[im 28/31  brain]
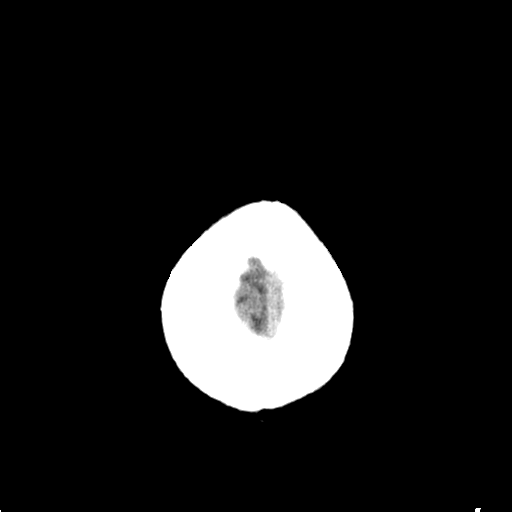
[im 28/31  bone]
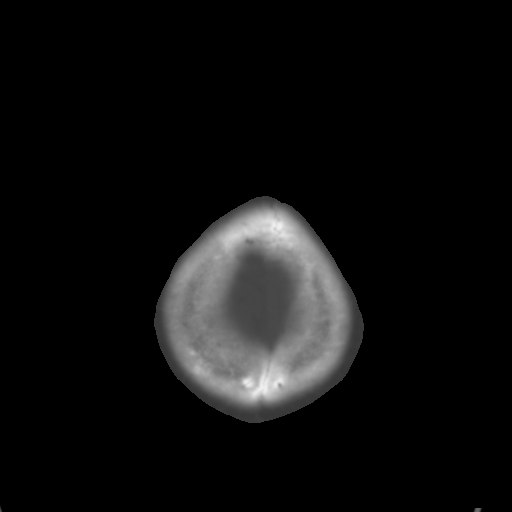

[Series 4: head 3.0 cor st · coronal · 0.32mm/px · 3 of 67 slices shown]
[im 23/67  brain]
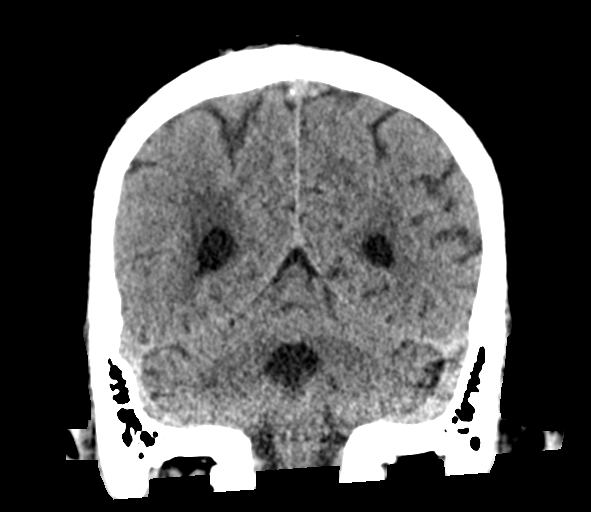
[im 30/67  brain]
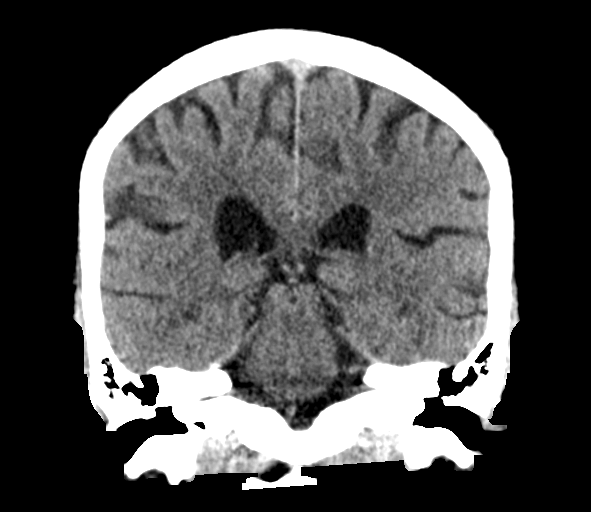
[im 37/67  brain]
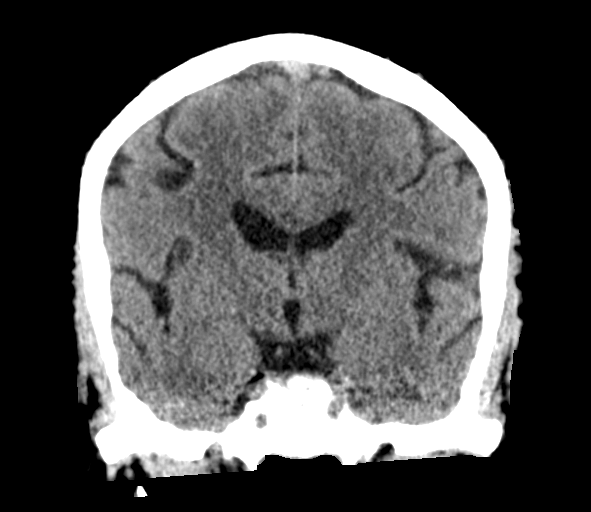

[Series 5: head 3.0 sag st · sagittal · 0.32mm/px · 3 of 54 slices shown]
[im 18/54  brain]
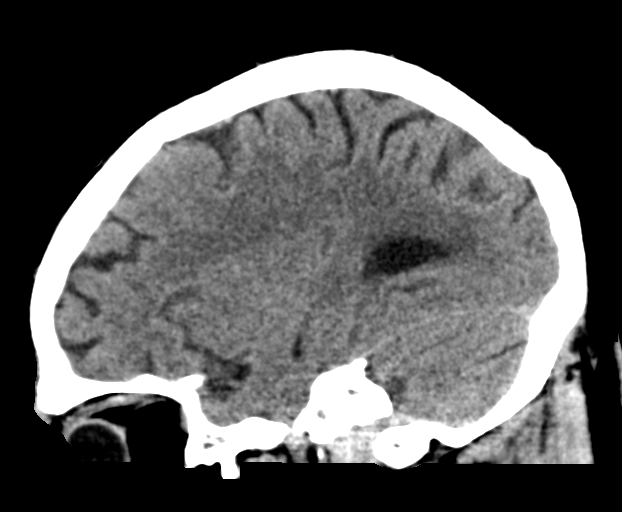
[im 27/54  brain]
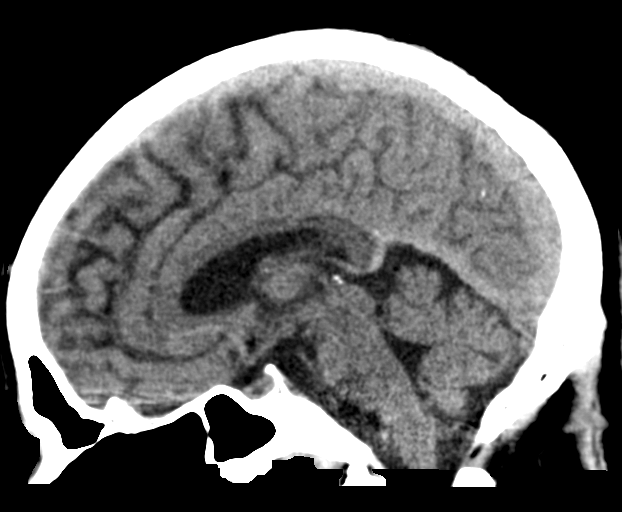
[im 36/54  brain]
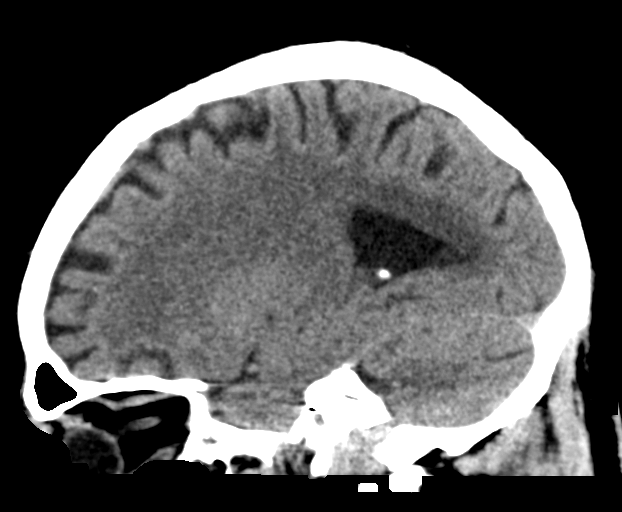

[15 of 47 positions shown; findings below may reference images not displayed]

FINDINGS: Brain: Cerebral volume remains normal for age. No midline shift,
ventriculomegaly, mass effect, evidence of mass lesion, intracranial
hemorrhage or evidence of cortically based acute infarction.
Gray-white matter differentiation is within normal limits throughout
the brain.

Vascular: Calcified atherosclerosis at the skull base. No suspicious
intracranial vascular hyperdensity.

Skull: No acute osseous abnormality identified.

Sinuses/Orbits: Visualized paranasal sinuses and mastoids are stable
and well pneumatized.

Other: No acute orbit or scalp soft tissue finding. Mildly disc
conjugate gaze is chronic.

ASPECTS (Alberta Stroke Program Early CT Score)

- Ganglionic level infarction (caudate, lentiform nuclei, internal
capsule, insula, M1-M3 cortex): 7

- Supraganglionic infarction (M4-M6 cortex): 3

Total score (0-10 with 10 being normal): 10
IMPRESSION: 1. Stable and normal for age noncontrast CT appearance of the brain.
2. ASPECTS is 10.
3. The above was relayed via text pager to CLAUDIO A on
[DATE] at [DATE] .

## 2016-03-02 MED ORDER — SODIUM CHLORIDE 0.9 % IV SOLN: Freq: Once | INTRAVENOUS | Status: DC

## 2016-03-02 MED ORDER — IOPAMIDOL (ISOVUE-370) INJECTION 76%
50.0000 mL | Freq: Once | INTRAVENOUS | Status: AC | PRN
  Administered 2016-03-02: 50 mL via INTRAVENOUS

## 2016-03-02 MED ORDER — KETOROLAC TROMETHAMINE 15 MG/ML IJ SOLN
15.0000 mg | Freq: Once | INTRAMUSCULAR | Status: AC
  Administered 2016-03-02: 15 mg via INTRAVENOUS
  Filled 2016-03-02: qty 1

## 2016-03-02 MED ORDER — TRAMADOL HCL 50 MG PO TABS: 50.0000 mg | ORAL_TABLET | Freq: Four times a day (QID) | ORAL | 0 refills | Status: DC | PRN

## 2016-03-02 MED ORDER — DIAZEPAM 5 MG PO TABS
5.0000 mg | ORAL_TABLET | Freq: Once | ORAL | Status: AC
  Administered 2016-03-02: 5 mg via ORAL
  Filled 2016-03-02: qty 1

## 2016-03-02 MED ORDER — CYCLOBENZAPRINE HCL 10 MG PO TABS: 10.0000 mg | ORAL_TABLET | Freq: Two times a day (BID) | ORAL | 0 refills | Status: DC | PRN

## 2016-03-02 MED ORDER — IOPAMIDOL (ISOVUE-370) INJECTION 76%
INTRAVENOUS | Status: AC
  Filled 2016-03-02: qty 50

## 2016-03-02 NOTE — ED Notes (Signed)
Pt  Reports  Symptoms     Started    About  945   Am        He  Also   Reports    Some  Difficulty    ambualting    As  Well

## 2016-03-02 NOTE — ED Notes (Signed)
Iv  Ns  tko   20  Angio     Into  l   Arm    1   Att       Placed  On cardiac  Monitor  And  Nasal  o2   At  2  l  /  Min

## 2016-03-02 NOTE — ED Provider Notes (Signed)
Medical screening exam: Patient transferred from urgent care for concern for left-sided weakness. Came in as code stroke. Evaluated by neurologist. NIH SS 0. CT head with no evidence of acute changes. Patient states he's had left-sided thoracic back pain starting at 9:00 this morning. Has had mild cough but no fever or chills. Pain is worse with movement and range of motion of the left arm. No new lower extremity swelling or pain. No fever or chills. Patient has 5/5 bilateral upper extremity motor. 5/5 bilateral lower extremity motor. Right lower extremity amputation with prosthesis. Speech is clear. Visual fields intact. Patient with tenderness to palpation along the sternal border of the thoracic spine on the left. Pain is exacerbated with range of motion of the left arm. Code stroke was canceled. Will need workup for thoracic back pain.   Loren Raceravid Jaquaya Coyle, MD 03/02/16 [phone]

## 2016-03-02 NOTE — ED Provider Notes (Signed)
The patient is a 60 year old male who states that he has high blood pressure and borderline diabetes, he awoke this morning and while he was doing his normal daily activities including washing dishes and going back and forth down the hallway to the bathroom he noticed that he had acute onset of pain in his left flank and side which radiated down towards his left mid axillary line  The patient denies any numbness weakness or any other neurologic complaints including visual symptoms, slurred speech or difficulty with gait or coordination. He originally went to the urgent care, they sent him to the emergency department for concern for a stroke because he was having difficulty using his left arm. The patient reports that the difficulty with the arm movement was because it caused severe pain in his side on the left whenever he moved his arm.  On exam the patient has reproducible tenderness over the muscles including his latissimus dorsi, serratus anterior as well as posteriorly onto the rhomboid muscles, he has significant reproducible tenderness with any motion of the arm however he states that Valium is made this better. He has normal grips, normal sensation, he can straight leg raise despite having a below-the-knee of dictation on the right. He has normal cranial nerves III through XII, normal speech, normal coordination and otherwise appears unremarkable. At this time I do not think that the patient is having an acute neurologic problem, this seems to be very musculoskeletal and with his improvement with both pain medicine and muscle relaxants I suspect that this is muscular skeletal. Why he is having that is unclear however his workup has been unremarkable including neuro imaging of the brain.  Appreciate neurologic service evaluation, the patient was informed of the recommendations for outpatient evaluation if his symptoms should persist. I do not feel the need to pursue MRI at this time.  I saw and  evaluated the patient, reviewed the resident's note and I agree with the findings and plan.   EKG Interpretation  Date/Time:  Sunday March 02 2016 14:40:41 EST Ventricular Rate:  70 PR Interval:    QRS Duration: 81 QT Interval:  394 QTC Calculation: 426 R Axis:   19 Text Interpretation:  Sinus rhythm Probable left atrial enlargement RSR' in V1 or V2, right VCD or RVH ST elevation, consider anterior injury since 05/10/09, no changes on ECG Confirmed by Elysabeth Aust  MD, Shadavia Dampier ([id number]) on 03/02/2016 5:04:06 PM        I personally interpreted the EKG as well as the resident and agree with the interpretation on the resident's chart.  Final diagnoses:  CVA (cerebral vascular accident) (HCC)  Acute left-sided low back pain without sciatica  Musculoskeletal back pain        Eber HongBrian Mazi Schuff, MD 03/04/16 1116

## 2016-03-02 NOTE — ED Notes (Signed)
Code  Stroke       Initiated        Reported  To [facility]        [facility]  Was  Notified

## 2016-03-02 NOTE — ED Triage Notes (Signed)
Patient transported from Urgent Care via [facility]. Reports the onset of severe left side back/flank pain and reduced strength in left arm. Code Stroke called prior to arrival. Transported immediately to CT 2 after Neurologist and Phlebotomy consult at bridge.

## 2016-03-02 NOTE — ED Triage Notes (Signed)
Pt  Reports  Pain  l  Arm   And  l  Side  Body  Pain is  Worse  On movement    And  posistions       Pt  Is  Awake  And  Alert  He  Is   Ambulatory        His  Hand  Grips   Are  Moderate       He  Has  Not taken his  Blood pressure  Medications    In 1   Week       His  Speech is  Intact  As   Well  As  His  Thought  Processes     According  To      Friend  His  Speech is   Is  The    Same  As      It  Always  Is

## 2016-03-02 NOTE — Progress Notes (Signed)
Code stroke called at 1340, [facility].  As per Patient LSN [id number], reports pain on left side of body that started this am, the pain is limiting his ability to use arm and leg .  NIHSS 0.

## 2016-03-02 NOTE — ED Provider Notes (Signed)
[facility] Provider Note   CSN: [id number] Arrival date & time: 03/02/16  1427   An emergency department physician performed an initial assessment on this suspected stroke patient at 1400.  History   Chief Complaint No chief complaint on file.   HPI Dominic Carter is a 60 y.o. male.  HPI Patient is a 60 year old male with past medical history of hypertension and borderline diabetes who presents with left-sided back pain.  It came on this morning while patient was doing some light housework. He reports a prior history of back spasms and pain is similar. Denies numbness, weakness or paresthesias. No urinary retention, incontinence, dysuria or hematuria. No fevers or chills. Patient initially evaluated at urgent care where it was felt that his left arm was weak and he was transferred via EMS to our Emergency Department for stroke evaluation.  Past Medical History:  Diagnosis Date  . Hypertension     There are no active problems to display for this patient.   Past Surgical History:  Procedure Laterality Date  . ABDOMINAL HYSTERECTOMY    . LEG AMPUTATION         Home Medications    Prior to Admission medications   Not on File    Family History History reviewed. No pertinent family history.  Social History Social History  Substance Use Topics  . Smoking status: Not on file  . Smokeless tobacco: Not on file  . Alcohol use Not on file     Allergies   Patient has no known allergies.   Review of Systems Review of Systems  Constitutional: Negative for chills and fever.  HENT: Negative for ear pain and sore throat.   Eyes: Negative for pain and visual disturbance.  Respiratory: Negative for cough and shortness of breath.   Cardiovascular: Negative for chest pain and palpitations.  Gastrointestinal: Negative for abdominal pain and vomiting.  Genitourinary: Negative for dysuria and hematuria.  Musculoskeletal: Positive for back pain. Negative for  arthralgias.  Skin: Negative for color change and rash.  Neurological: Negative for seizures and syncope.  All other systems reviewed and are negative.    Physical Exam Updated Vital Signs BP (!) 144/101   Pulse 68   Temp 98.6 F (37 C)   Resp 16   SpO2 93%   Physical Exam  Constitutional: He is oriented to person, place, and time. He appears well-developed and well-nourished.  HENT:  Head: Normocephalic and atraumatic.  Eyes: Conjunctivae are normal.  Neck: Neck supple.  Cardiovascular: Normal rate and regular rhythm.   No murmur heard. Pulmonary/Chest: Effort normal and breath sounds normal. No respiratory distress.  Abdominal: Soft. There is no tenderness.  Musculoskeletal: He exhibits no edema.  Tenderness to palpation over left lower back. No midline tenderness to palpation over cervical, thoracic or lumbar spine. Full range of motion of neck without tenderness. Straight leg raise is negative. Status post right BKA with prosthetic limb.  Neurological: He is alert and oriented to person, place, and time. He has normal strength. No cranial nerve deficit or sensory deficit. Coordination normal.  Skin: Skin is warm and dry.  Psychiatric: He has a normal mood and affect.  Nursing note and vitals reviewed.    ED Treatments / Results  Labs (all labs ordered are listed, but only abnormal results are displayed) Labs Reviewed  COMPREHENSIVE METABOLIC PANEL - Abnormal; Notable for the following:       Result Value   Sodium 131 (*)    Chloride 99 (*)  Calcium 8.4 (*)    All other components within normal limits  URINALYSIS, ROUTINE W REFLEX MICROSCOPIC (NOT AT [facility]) - Abnormal; Notable for the following:    Leukocytes, UA TRACE (*)    All other components within normal limits  URINE MICROSCOPIC-ADD ON - Abnormal; Notable for the following:    Squamous Epithelial / LPF 0-5 (*)    All other components within normal limits  ETHANOL  PROTIME-INR  APTT  CBC  DIFFERENTIAL    RAPID URINE DRUG SCREEN, HOSP PERFORMED  I-STAT CHEM 8, ED  I-STAT TROPOININ, ED    EKG  EKG Interpretation None       Radiology Ct Angio Head W Or Wo Contrast  Result Date: 03/02/2016 CLINICAL DATA:  60 year old male code stroke. Left side weakness and difficulty walking. Initial encounter. EXAM: CT ANGIOGRAPHY HEAD AND NECK TECHNIQUE: Multidetector CT imaging of the head and neck was performed using the standard protocol during bolus administration of intravenous contrast. Multiplanar CT image reconstructions and MIPs were obtained to evaluate the vascular anatomy. Carotid stenosis measurements (when applicable) are obtained utilizing NASCET criteria, using the distal internal carotid diameter as the denominator. CONTRAST:  50 mL Isovue 370 COMPARISON:  Head CT without contrast 1410 hours today. FINDINGS: CTA NECK Skeleton: Absent dentition. Degenerative changes in the cervical spine. No acute osseous abnormality identified. Upper chest: Negative lung apices, mild dependent atelectasis. No superior mediastinal lymphadenopathy. Other neck: Negative thyroid, larynx (glottis is closed), pharynx, parapharyngeal spaces, retropharyngeal space, sublingual space, submandibular glands and parotid glands. No cervical lymphadenopathy. Aortic arch: 3 vessel arch configuration. Mild calcified arch atherosclerosis. Right carotid system: Minimal soft and calcified brachiocephalic artery atherosclerosis without stenosis. No right CCA origin stenosis. Mildly tortuous proximal right CCA. Negative right carotid bifurcation and cervical right ICA. Left carotid system: Negative. Vertebral arteries:No proximal right subclavian artery stenosis. The right vertebral artery is occluded at its origin, and throughout the neck except for a brief focus of reconstitution in the distal V2 segment at the C3 vertebral level (series 4, image 95). Soft plaque in the proximal left subclavian artery without stenosis. The left  vertebral artery origin is shared with the origin of the muscular branch (series 4, image 137). No left vertebral origin stenosis. No left vertebral stenosis to the skullbase. CTA HEAD Posterior circulation: The distal right vertebral artery is occluded proximal to the right PICA origin. It appears there is reconstitution of the distal right V4 segment from the vertebrobasilar junction. The right PICA origin is patent. The distal left vertebral artery is patent, although somewhat diminutive beyond the patent left PICA origin. The basilar artery is patent. The basilar artery is diminutive, and there are fetal type bilateral PCA origins. Both SCA origins are patent. Bilateral PCA branches are within normal limits. Anterior circulation: Both ICA siphons are patent, the right is negative with un normal right posterior communicating artery origin. There is calcified plaque in the left siphon at the distal cavernous segment which does not appear hemodynamically significant. Normal left posterior communicating artery origin. Patent carotid termini. Normal MCA origins. The right ACA A1 segment is diminutive or absent. Normal left A1, anterior communicating artery, and bilateral ACA branches. Left MCA M1 segment, bifurcation, and left MCA branches are within normal limits. Right MCA M1 segment, bifurcation, and right MCA branches are within normal limits. Venous sinuses: Patent. Anatomic variants: Dominant left ACA A1 segment, the right is diminutive or absent. Fetal type bilateral PCA origins. Review of the MIP images confirms the above  findings IMPRESSION: 1. Negative for anterior circulation large vessel stenosis or occlusion. Normal cervical carotids. Mild left ICA siphon calcified atherosclerosis. 2. 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 The distal right V4 segment and right PICA are supplied in a retrograde fashion from the vertebrobasilar junction. No left vertebral artery stenosis. Diminutive basilar  artery on the basis of fetal type PCA origins. 3. Preliminary report of the above discussed by telephone with Dr. Cy Blamer at 1425 hours on 03/02/2016. Electronically Signed   By: Odessa Fleming M.D.   On: 03/02/2016 14:45   Ct Angio Neck W Or Wo Contrast  Result Date: 03/02/2016 CLINICAL DATA:  60 year old male code stroke. Left side weakness and difficulty walking. Initial encounter. EXAM: CT ANGIOGRAPHY HEAD AND NECK TECHNIQUE: Multidetector CT imaging of the head and neck was performed using the standard protocol during bolus administration of intravenous contrast. Multiplanar CT image reconstructions and MIPs were obtained to evaluate the vascular anatomy. Carotid stenosis measurements (when applicable) are obtained utilizing NASCET criteria, using the distal internal carotid diameter as the denominator. CONTRAST:  50 mL Isovue 370 COMPARISON:  Head CT without contrast 1410 hours today. FINDINGS: CTA NECK Skeleton: Absent dentition. Degenerative changes in the cervical spine. No acute osseous abnormality identified. Upper chest: Negative lung apices, mild dependent atelectasis. No superior mediastinal lymphadenopathy. Other neck: Negative thyroid, larynx (glottis is closed), pharynx, parapharyngeal spaces, retropharyngeal space, sublingual space, submandibular glands and parotid glands. No cervical lymphadenopathy. Aortic arch: 3 vessel arch configuration. Mild calcified arch atherosclerosis. Right carotid system: Minimal soft and calcified brachiocephalic artery atherosclerosis without stenosis. No right CCA origin stenosis. Mildly tortuous proximal right CCA. Negative right carotid bifurcation and cervical right ICA. Left carotid system: Negative. Vertebral arteries:No proximal right subclavian artery stenosis. The right vertebral artery is occluded at its origin, and throughout the neck except for a brief focus of reconstitution in the distal V2 segment at the C3 vertebral level (series 4, image 95). Soft  plaque in the proximal left subclavian artery without stenosis. The left vertebral artery origin is shared with the origin of the muscular branch (series 4, image 137). No left vertebral origin stenosis. No left vertebral stenosis to the skullbase. CTA HEAD Posterior circulation: The distal right vertebral artery is occluded proximal to the right PICA origin. It appears there is reconstitution of the distal right V4 segment from the vertebrobasilar junction. The right PICA origin is patent. The distal left vertebral artery is patent, although somewhat diminutive beyond the patent left PICA origin. The basilar artery is patent. The basilar artery is diminutive, and there are fetal type bilateral PCA origins. Both SCA origins are patent. Bilateral PCA branches are within normal limits. Anterior circulation: Both ICA siphons are patent, the right is negative with un normal right posterior communicating artery origin. There is calcified plaque in the left siphon at the distal cavernous segment which does not appear hemodynamically significant. Normal left posterior communicating artery origin. Patent carotid termini. Normal MCA origins. The right ACA A1 segment is diminutive or absent. Normal left A1, anterior communicating artery, and bilateral ACA branches. Left MCA M1 segment, bifurcation, and left MCA branches are within normal limits. Right MCA M1 segment, bifurcation, and right MCA branches are within normal limits. Venous sinuses: Patent. Anatomic variants: Dominant left ACA A1 segment, the right is diminutive or absent. Fetal type bilateral PCA origins. Review of the MIP images confirms the above findings IMPRESSION: 1. Negative for anterior circulation large vessel stenosis or occlusion. Normal cervical carotids. Mild left  ICA siphon calcified atherosclerosis. 2. 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 The distal right V4 segment and right PICA are supplied in a retrograde fashion from the  vertebrobasilar junction. No left vertebral artery stenosis. Diminutive basilar artery on the basis of fetal type PCA origins. 3. Preliminary report of the above discussed by telephone with Dr. Cy BlamerZULFIQAR TURK at 1425 hours on 03/02/2016. Electronically Signed   By: Odessa FlemingH  Hall M.D.   On: 03/02/2016 14:45   Ct Head Code Stroke Wo Contrast`  Result Date: 03/02/2016 CLINICAL DATA:  Code stroke. 60 year old male with left side weakness and difficulty walking. Last seen normal 0945 hours. Initial encounter. EXAM: CT HEAD WITHOUT CONTRAST TECHNIQUE: Contiguous axial images were obtained from the base of the skull through the vertex without intravenous contrast. COMPARISON:  Head CT without contrast 03/07/2006. FINDINGS: Brain: Cerebral volume remains normal for age. No midline shift, ventriculomegaly, mass effect, evidence of mass lesion, intracranial hemorrhage or evidence of cortically based acute infarction. Gray-white matter differentiation is within normal limits throughout the brain. Vascular: Calcified atherosclerosis at the skull base. No suspicious intracranial vascular hyperdensity. Skull: No acute osseous abnormality identified. Sinuses/Orbits: Visualized paranasal sinuses and mastoids are stable and well pneumatized. Other: No acute orbit or scalp soft tissue finding. Mildly disc conjugate gaze is chronic. ASPECTS (Alberta Stroke Program Early CT Score) - Ganglionic level infarction (caudate, lentiform nuclei, internal capsule, insula, M1-M3 cortex): 7 - Supraganglionic infarction (M4-M6 cortex): 3 Total score (0-10 with 10 being normal): 10 IMPRESSION: 1. Stable and normal for age noncontrast CT appearance of the brain. 2. ASPECTS is 10. 3. The above was relayed via text pager to [facility] TURK on 03/02/2016 at 2:24 pm . Electronically Signed   By: Odessa FlemingH  Hall M.D.   On: 03/02/2016 14:24    Procedures Procedures (including critical care time)  Medications Ordered in ED Medications  iopamidol (ISOVUE-370)  76 % injection 50 mL (50 mLs Intravenous Contrast Given 03/02/16 1418)     Initial Impression / Assessment and Plan / ED Course  I have reviewed the triage vital signs and the nursing notes.  Pertinent labs & imaging results that were available during my care of the patient were reviewed by me and considered in my medical decision making (see chart for details).  Clinical Course   Patient is a 60 year old male with past medical history above who initially presented as a code stroke from urgent care for possible LUE weakness. Patient reports he started having left-sided back pain and activity today. He reports he is having difficulty moving his left arm secondary to pain. Stroke studies negative for acute pathology. Afebrile, VSS. Exam tenderness to palpation and movement as above consistent with musculoskeletal pain. No focal neurologic deficits. He was given IV Toradol and by mouth Valium with significant improvement. Neurology has evaluated the patient as part code stroke and recommended possible MRI of brain and lumbar spine. However, do not think patient needs emergent advanced imaging at this time. Labs and UA unremarkable. No signs of UTI, pyelonephritis, or stone. Patient has improved on treatments and exam is consistent with musculoskeletal pain. Discharged in stable condition. Will follow up with primary care doctor for reevaluation and further advanced imaging is needed. Strict return precautions discussed and patient in understanding and agreement with plan.   Discussed with Dr. Hyacinth MeekerMiller, ED attending  Final Clinical Impressions(s) / ED Diagnoses   Final diagnoses:  CVA (cerebral vascular accident) (HCC)  Acute left-sided low back pain without sciatica  Musculoskeletal back pain  New Prescriptions New Prescriptions   No medications on file     Isa RankinAnn B Heinrich Fertig, MD 03/03/16 0007    Eber HongBrian Miller, MD 03/04/16 [phone]

## 2016-03-02 NOTE — ED Provider Notes (Signed)
[facility]    CSN: [id number] Arrival date & time: 03/02/16  1209     History   Chief Complaint Chief Complaint  Patient presents with  . Arm Pain    HPI Dominic Carter is a 60 y.o. male.   The history is provided by the patient.  Arm Pain  This is a new problem. The current episode started 3 to 5 hours ago (sudden left arm and leg pain. no speech problem, no bp meds for 2 wks.). The problem has been gradually worsening. Pertinent negatives include no chest pain, no abdominal pain and no headaches.    Past Medical History:  Diagnosis Date  . Hypertension     There are no active problems to display for this patient.   History reviewed. No pertinent surgical history.     Home Medications    Prior to Admission medications   Not on File    Family History History reviewed. No pertinent family history.  Social History Social History  Substance Use Topics  . Smoking status: Not on file  . Smokeless tobacco: Not on file  . Alcohol use Not on file     Allergies   Patient has no known allergies.   Review of Systems Review of Systems  Respiratory: Negative.   Cardiovascular: Negative.  Negative for chest pain.  Gastrointestinal: Negative for abdominal pain.  Neurological: Positive for weakness. Negative for dizziness, speech difficulty, light-headedness, numbness and headaches.  All other systems reviewed and are negative.    Physical Exam Triage Vital Signs ED Triage Vitals  Enc Vitals Group     BP 03/02/16 1323 (!) 183/123     Pulse Rate 03/02/16 1323 78     Resp 03/02/16 1323 18     Temp 03/02/16 1323 98.6 F (37 C)     Temp Source 03/02/16 1323 Oral     SpO2 03/02/16 1323 98 %     Weight --      Height --      Head Circumference --      Peak Flow --      Pain Score 03/02/16 1326 7     Pain Loc --      Pain Edu? --      Excl. in GC? --    No data found.   Updated Vital Signs BP (!) 183/123 (BP Location: Left Arm)    Pulse 78   Temp 98.6 F (37 C) (Oral)   Resp 18   SpO2 98%   Visual Acuity Right Eye Distance:   Left Eye Distance:   Bilateral Distance:    Right Eye Near:   Left Eye Near:    Bilateral Near:     Physical Exam  Constitutional: He is oriented to person, place, and time. He appears distressed.  HENT:  Head: Normocephalic and atraumatic.  Neck: Normal range of motion. Neck supple.  Cardiovascular: Normal rate and regular rhythm.   Pulmonary/Chest: Effort normal and breath sounds normal.  Musculoskeletal: He exhibits tenderness.  Lymphadenopathy:    He has no cervical adenopathy.  Neurological: He is alert and oriented to person, place, and time. No cranial nerve deficit.  Spasticity of left arm, nl grip stregnth prosthesis right leg.  Skin: Skin is warm and dry.  Nursing note and vitals reviewed.    UC Treatments / Results  Labs (all labs ordered are listed, but only abnormal results are displayed) Labs Reviewed - No data to display  EKG  EKG Interpretation  None       Radiology No results found.  Procedures Procedures (including critical care time)  Medications Ordered in UC Medications  0.9 %  sodium chloride infusion (not administered)     Initial Impression / Assessment and Plan / UC Course  I have reviewed the triage vital signs and the nursing notes.  Pertinent labs & imaging results that were available during my care of the patient were reviewed by me and considered in my medical decision making (see chart for details).  Clinical Course     Sent for eval of left sided pain assoc with severe hbp, poss hypertensive urgency, or cva.  Final Clinical Impressions(s) / UC Diagnoses   Final diagnoses:  Hypertensive crisis    New Prescriptions New Prescriptions   No medications on file     Linna HoffJames D Kindl, MD 03/02/16 1340

## 2016-03-02 NOTE — Consult Note (Signed)
NEURO HOSPITALIST CONSULT NOTE   Reason for Consult:code stroke   HPI:                                                                                                                                          Dominic Carter is an 60 y.o. male with history of hypertension, peripheral vascular disease, status post right BKA was referred to the emergency room and code stroke. Patient was initially evaluated in urgent care.  Patient and his symptoms started 9:45 AM with left-sided leg low/mid back pain, radiating to his arm and he had trouble walking. Patient presented to the emergency room at 2 PM. CT scan of the head did not show any acute abnormalities. CTA head and neck did not show any large vessel occlusion. He denies any chest pain palpitation fever chills night sweats no change in bowel or urinary habits. He continues to smoke cigarettes. He has not been taking his medications for his blood pressure  Past Medical History:  Diagnosis Date  . Hypertension     Past Surgical History:  Procedure Laterality Date  . ABDOMINAL HYSTERECTOMY    . LEG AMPUTATION       Family history Hypertension  Social History: smoke cigarettes on daily basis No Known Allergies  MEDICATIONS:                                                                                                                  none   ROS:                                                                                                                                    As mentioned in history of present illness  Gen. Lying in stretcher seems little uncomfortable because of back pain Neck is supple without any lymphadenopathy Lungs clear to auscultation CVS S1-S2 regular Skin no signs or scars or bruises, right below the knee amputation with prosthesis Neurologic alert and oriented 3 Cranial nerves II 12 intact Motor exam did not reveal any focal weakness, right below the knee amputation with  prosthesis. Speech is fluent Gait was not assessed                                                                                                 Lab Results: Basic Metabolic Panel: No results for input(s): NA, K, CL, CO2, GLUCOSE, BUN, CREATININE, CALCIUM, MG, PHOS in the last 168 hours.  Liver Function Tests: No results for input(s): AST, ALT, ALKPHOS, BILITOT, PROT, ALBUMIN in the last 168 hours. No results for input(s): LIPASE, AMYLASE in the last 168 hours. No results for input(s): AMMONIA in the last 168 hours.  CBC:  Recent Labs Lab 03/02/16 1400  WBC 7.7  NEUTROABS 5.2  HGB 15.2  HCT 44.7  MCV 89.9  PLT 217      Imaging: Ct Head Code Stroke Wo Contrast`  Result Date: 03/02/2016 CLINICAL DATA:  Code stroke. 60 year old male with left side weakness and difficulty walking. Last seen normal 0945 hours. Initial encounter. EXAM: CT HEAD WITHOUT CONTRAST TECHNIQUE: Contiguous axial images were obtained from the base of the skull through the vertex without intravenous contrast. COMPARISON:  Head CT without contrast 03/07/2006. FINDINGS: Brain: Cerebral volume remains normal for age. No midline shift, ventriculomegaly, mass effect, evidence of mass lesion, intracranial hemorrhage or evidence of cortically based acute infarction. Gray-white matter differentiation is within normal limits throughout the brain. Vascular: Calcified atherosclerosis at the skull base. No suspicious intracranial vascular hyperdensity. Skull: No acute osseous abnormality identified. Sinuses/Orbits: Visualized paranasal sinuses and mastoids are stable and well pneumatized. Other: No acute orbit or scalp soft tissue finding. Mildly disc conjugate gaze is chronic. ASPECTS (Alberta Stroke Program Early CT Score) - Ganglionic level infarction (caudate, lentiform nuclei, internal capsule, insula, M1-M3 cortex): 7 - Supraganglionic infarction (M4-M6 cortex): 3 Total score (0-10 with 10 being normal): 10  IMPRESSION: 1. Stable and normal for age noncontrast CT appearance of the brain. 2. ASPECTS is 10. 3. The above was relayed via text pager to [facility] LLC[facility]ULFIQ[facility] [name] on 03/02/2016 at 2:24 pm . Electronically Signed   By: Dominic Carter M.D.   On: 03/02/2016 14:24        03/02/2016, 2:44 PM   Assessment/Plan: 60 year-old male with history of tobacco abuse and hypertension presented with code stroke. Patient initial NIH score is 0. CT scan of the head and CTA head and neck did not show any acute abnormalities. Patient is not an IV TPA candidate. Patient was given aspirin. Recommend brain MRI and lumbar spine MRI for further management

## 2016-03-02 NOTE — Discharge Instructions (Signed)
If you experience worsening back pain, numbness or weakness in your legs, or difficulty urinating return to the emergency department for evaluation.  Follow-up with your primary care doctor for re-evaluation. If you have persistent back pain, you may require an MRI of your back  as an outpatient. Do not drive while taking Flexeril or other muscle relaxers.

## 2016-03-03 LAB — I-STAT CHEM 8, ED
BUN: 8 mg/dL (ref 6–20)
CALCIUM ION: 1.09 mmol/L — AB (ref 1.15–1.40)
CHLORIDE: 104 mmol/L (ref 101–111)
Creatinine, Ser: 1 mg/dL (ref 0.61–1.24)
Glucose, Bld: 94 mg/dL (ref 65–99)
HEMATOCRIT: 48 % (ref 39.0–52.0)
Hemoglobin: 16.3 g/dL (ref 13.0–17.0)
POTASSIUM: 4 mmol/L (ref 3.5–5.1)
SODIUM: 139 mmol/L (ref 135–145)
TCO2: 26 mmol/L (ref 0–100)

## 2016-03-03 LAB — I-STAT TROPONIN, ED: Troponin i, poc: 0 ng/mL (ref 0.00–0.08)

## 2016-03-03 LAB — CBG MONITORING, ED: Glucose-Capillary: 89 mg/dL (ref 65–99)

## 2017-05-04 ENCOUNTER — Encounter (HOSPITAL_COMMUNITY): Payer: Self-pay | Admitting: Family Medicine

## 2017-05-04 ENCOUNTER — Emergency Department (HOSPITAL_COMMUNITY)
Admission: EM | Admit: 2017-05-04 | Discharge: 2017-05-04 | Disposition: A | Discharge status: Home / Self Care | Payer: Medicare Other | Attending: Emergency Medicine | Admitting: Emergency Medicine

## 2017-05-04 DIAGNOSIS — Y9389 Activity, other specified: Secondary | ICD-10-CM | POA: Diagnosis not present

## 2017-05-04 DIAGNOSIS — I10 Essential (primary) hypertension: Secondary | ICD-10-CM | POA: Insufficient documentation

## 2017-05-04 DIAGNOSIS — X500XXA Overexertion from strenuous movement or load, initial encounter: Secondary | ICD-10-CM | POA: Insufficient documentation

## 2017-05-04 DIAGNOSIS — S39012A Strain of muscle, fascia and tendon of lower back, initial encounter: Secondary | ICD-10-CM | POA: Diagnosis not present

## 2017-05-04 DIAGNOSIS — Y999 Unspecified external cause status: Secondary | ICD-10-CM | POA: Insufficient documentation

## 2017-05-04 DIAGNOSIS — F17210 Nicotine dependence, cigarettes, uncomplicated: Secondary | ICD-10-CM | POA: Diagnosis not present

## 2017-05-04 DIAGNOSIS — S3992XA Unspecified injury of lower back, initial encounter: Secondary | ICD-10-CM | POA: Diagnosis present

## 2017-05-04 DIAGNOSIS — Y92008 Other place in unspecified non-institutional (private) residence as the place of occurrence of the external cause: Secondary | ICD-10-CM | POA: Insufficient documentation

## 2017-05-04 HISTORY — DX: Acute embolism and thrombosis of unspecified deep veins of unspecified lower extremity: I82.409

## 2017-05-04 MED ORDER — NAPROXEN 500 MG PO TABS: 500.0000 mg | ORAL_TABLET | Freq: Two times a day (BID) | ORAL | 0 refills | Status: DC

## 2017-05-04 MED ORDER — METHOCARBAMOL 500 MG PO TABS
500.0000 mg | ORAL_TABLET | Freq: Once | ORAL | Status: AC
  Administered 2017-05-04: 500 mg via ORAL
  Filled 2017-05-04: qty 1

## 2017-05-04 MED ORDER — OXYCODONE-ACETAMINOPHEN 5-325 MG PO TABS
1.0000 | ORAL_TABLET | Freq: Once | ORAL | Status: AC
  Administered 2017-05-04: 1 via ORAL
  Filled 2017-05-04: qty 1

## 2017-05-04 MED ORDER — METHOCARBAMOL 500 MG PO TABS: 500.0000 mg | ORAL_TABLET | Freq: Every evening | ORAL | 0 refills | Status: DC | PRN

## 2017-05-04 NOTE — Discharge Instructions (Signed)
As discussed, the medicine prescribed can help with muscle spasm but cannot be taken if driving, with alcohol or operating machinery.  ° °Follow up with your Primary care provider if symptoms  persist beyond a week. ° °Return if worsening or new concerning symptoms in the meantime.  °

## 2017-05-04 NOTE — ED Notes (Signed)
Patient ambulatory with steady gait. States he is sore but feels better.

## 2017-05-04 NOTE — ED Provider Notes (Signed)
[facility]-EMERGENCY DEPT Provider Note   CSN: [id number] Arrival date & time: 05/04/17  1821     History   Chief Complaint Chief Complaint  Patient presents with  . Back Pain    HPI Dominic Carter is a 62 y.o. male past medical history significant for hypertension and DVT presenting with sudden onset right-sided lower back pain while pulling close out of the washer into the dryer prior to arrival.  Reports that he fell 2 days ago tripping over his prosthesis felt some mild discomfort in his lower back which went away until today.  He denies any radiation down the lower extremities, no numbness tingling, no loss of bowel or bladder function, no fever no chills, history of IV drug use.  HPI  Past Medical History:  Diagnosis Date  . DVT (deep venous thrombosis) (HCC)   . Hypertension     There are no active problems to display for this patient.   Past Surgical History:  Procedure Laterality Date  . LEG AMPUTATION         Home Medications    Prior to Admission medications   Medication Sig Start Date End Date Taking? Authorizing Provider  cyclobenzaprine (FLEXERIL) 10 MG tablet Take 1 tablet (10 mg total) by mouth 2 (two) times daily as needed for muscle spasms. 03/02/16   Isa Rankin, MD  methocarbamol (ROBAXIN) 500 MG tablet Take 1 tablet (500 mg total) by mouth at bedtime as needed. 05/04/17   Mathews Robinsons B, PA-C  naproxen (NAPROSYN) 500 MG tablet Take 1 tablet (500 mg total) by mouth 2 (two) times daily with a meal. 05/04/17   Mathews Robinsons B, PA-C  traMADol (ULTRAM) 50 MG tablet Take 1 tablet (50 mg total) by mouth every 6 (six) hours as needed. 03/02/16   Isa Rankin, MD    Family History History reviewed. No pertinent family history.  Social History Social History   Tobacco Use  . Smoking status: Current Every Day Smoker    Packs/day: 0.50    Types: Cigarettes  . Smokeless tobacco: Never Used  Substance Use Topics  .  Alcohol use: No    Frequency: Never  . Drug use: No     Allergies   Patient has no known allergies.   Review of Systems Review of Systems  Constitutional: Negative for chills, diaphoresis, fatigue, fever and unexpected weight change.  Respiratory: Negative for cough, choking, shortness of breath, wheezing and stridor.   Cardiovascular: Negative for chest pain, palpitations and leg swelling.  Gastrointestinal: Negative for abdominal pain, nausea and vomiting.  Genitourinary: Negative for difficulty urinating, dysuria, flank pain, frequency and hematuria.  Musculoskeletal: Positive for back pain. Negative for arthralgias, gait problem, joint swelling, myalgias, neck pain and neck stiffness.  Skin: Negative for color change, pallor, rash and wound.  Neurological: Negative for dizziness, seizures, syncope, weakness, light-headedness, numbness and headaches.  Psychiatric/Behavioral: Agitation:      Physical Exam Updated Vital Signs BP (!) 178/104 (BP Location: Right Arm)   Pulse 89   Temp 98.1 F (36.7 C) (Oral)   Resp 20   Ht 5\' 11"  (1.803 m)   Wt 79.4 kg (175 lb)   SpO2 94%   BMI 24.41 kg/m   Physical Exam  Constitutional: He appears well-developed and well-nourished. No distress.  Afebrile, nontoxic-appearing, sitting comfortably in chair in no acute distress.  HENT:  Head: Normocephalic and atraumatic.  Eyes: Conjunctivae and EOM are normal.  Neck: Normal range of motion.  Neck supple.  Cardiovascular: Normal rate, regular rhythm and normal heart sounds.  No murmur heard. Pulmonary/Chest: Effort normal and breath sounds normal. No stridor. No respiratory distress. He has no wheezes. He has no rales.  Abdominal: Soft. He exhibits no distension. There is no tenderness.  Musculoskeletal: Normal range of motion. He exhibits tenderness. He exhibits no edema or deformity.  Tenderness palpation of the right lower back musculature.  No tenderness palpation of the pelvis and  hips, no midline tenderness palpation of the spine. 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  Neurological: He is alert. No sensory deficit. He exhibits normal muscle tone.  5 out of 5 strength lower extremities bilaterally  Skin: Skin is warm and dry. No rash noted. He is not diaphoretic. No erythema. No pallor.  Psychiatric: He has a normal mood and affect.  Nursing note and vitals reviewed.    ED Treatments / Results  Labs (all labs ordered are listed, but only abnormal results are displayed) Labs Reviewed - No data to display  EKG  EKG Interpretation None       Radiology No results found.  Procedures Procedures (including critical care time)  Medications Ordered in ED Medications  oxyCODONE-acetaminophen (PERCOCET/ROXICET) 5-325 MG per tablet 1 tablet (not administered)  methocarbamol (ROBAXIN) tablet 500 mg (not administered)     Initial Impression / Assessment and Plan / ED Course  I have reviewed the triage vital signs and the nursing notes.  Pertinent labs & imaging results that were available during my care of the patient were reviewed by me and considered in my medical decision making (see chart for details).     Patient presents with lower back pain.  No gross neurological deficits and normal neuro exam.  Patient has no gait abnormality or concern for cauda equina.  No loss of bowel or bladder control, fever, night sweats, weight loss, h/o malignancy, or IVDU.  RICE protocol and pain medications indicated and discussed with patient.   Patient's pain was managed while in the emergency department. Patient improved and he was able to ambulate without difficulties.  Discharge home with symptomatic relief and close follow-up with the wellness center.  Discussed strict return precautions and advised to return to the emergency department if experiencing any new or worsening symptoms. Instructions were understood and patient agreed with discharge  plan.  Final Clinical Impressions(s) / ED Diagnoses   Final diagnoses:  Strain of lumbar region, initial encounter    ED Discharge Orders        Ordered    methocarbamol (ROBAXIN) 500 MG tablet  At bedtime PRN     05/04/17 2137    naproxen (NAPROSYN) 500 MG tablet  2 times daily with meals     05/04/17 2137       Gregary CromerMitchell, Jessica B, PA-C 05/04/17 2231    Linwood DibblesKnapp, Jon, MD 05/05/17 0003

## 2017-05-04 NOTE — ED Triage Notes (Signed)
Patient is complaining of right lower back that started today. Patient has fell once last week and once over the weekend. Patient reports when he got up from the chair to go to the door, he fell from the pain in his pack. Patient reports he does not use a mobility device to ambulate with but has a prostatic right leg. Denies any urinary symptoms or numbness/tingling.

## 2017-05-04 NOTE — ED Notes (Signed)
Patient medicated and heat applied to affected area. Will attempt ambulation in 15 minutes.

## 2019-02-08 ENCOUNTER — Other Ambulatory Visit: Payer: Self-pay

## 2019-02-08 DIAGNOSIS — Z20822 Contact with and (suspected) exposure to covid-19: Secondary | ICD-10-CM

## 2019-02-09 ENCOUNTER — Telehealth: Payer: Self-pay

## 2019-02-09 LAB — NOVEL CORONAVIRUS, NAA: SARS-CoV-2, NAA: NOT DETECTED

## 2019-02-09 NOTE — Telephone Encounter (Signed)
Patient's niece called requesting COVID19 lab results  - No DPR/POA on fille - advised niece I would to speak to patient to obtain verbal consent before speaking to her, no further questions.

## 2019-02-10 ENCOUNTER — Telehealth: Payer: Self-pay

## 2019-02-10 NOTE — Telephone Encounter (Signed)
Patient called in requesting [place] lab results - obtained verbal consent to speak in front of his niece, [name] - DOB/Address verified  - Negative results given. We were disconnected while in the process of setting up MyChart.

## 2019-10-09 ENCOUNTER — Emergency Department (HOSPITAL_COMMUNITY): Payer: Medicare Other

## 2019-10-09 ENCOUNTER — Other Ambulatory Visit: Payer: Self-pay

## 2019-10-09 ENCOUNTER — Emergency Department (HOSPITAL_COMMUNITY)
Admission: EM | Admit: 2019-10-09 | Discharge: 2019-10-09 | Disposition: A | Discharge status: Home / Self Care | Payer: Medicare Other | Attending: Emergency Medicine | Admitting: Emergency Medicine

## 2019-10-09 ENCOUNTER — Encounter (HOSPITAL_COMMUNITY): Payer: Self-pay | Admitting: *Deleted

## 2019-10-09 DIAGNOSIS — F17210 Nicotine dependence, cigarettes, uncomplicated: Secondary | ICD-10-CM | POA: Diagnosis not present

## 2019-10-09 DIAGNOSIS — F10929 Alcohol use, unspecified with intoxication, unspecified: Secondary | ICD-10-CM | POA: Diagnosis not present

## 2019-10-09 DIAGNOSIS — Y909 Presence of alcohol in blood, level not specified: Secondary | ICD-10-CM | POA: Diagnosis not present

## 2019-10-09 DIAGNOSIS — I10 Essential (primary) hypertension: Secondary | ICD-10-CM | POA: Insufficient documentation

## 2019-10-09 DIAGNOSIS — F10920 Alcohol use, unspecified with intoxication, uncomplicated: Secondary | ICD-10-CM

## 2019-10-09 LAB — COMPREHENSIVE METABOLIC PANEL
ALT: 37 U/L (ref 0–44)
AST: 63 U/L — ABNORMAL HIGH (ref 15–41)
Albumin: 4.2 g/dL (ref 3.5–5.0)
Alkaline Phosphatase: 89 U/L (ref 38–126)
Anion gap: 13 (ref 5–15)
BUN: 7 mg/dL — ABNORMAL LOW (ref 8–23)
CO2: 25 mmol/L (ref 22–32)
Calcium: 8.5 mg/dL — ABNORMAL LOW (ref 8.9–10.3)
Chloride: 100 mmol/L (ref 98–111)
Creatinine, Ser: 1 mg/dL (ref 0.61–1.24)
GFR calc Af Amer: >60 mL/min (ref >60)
GFR calc non Af Amer: >60 mL/min (ref >60)
Glucose, Bld: 83 mg/dL (ref 70–99)
Potassium: 3.3 mmol/L — ABNORMAL LOW (ref 3.5–5.1)
Sodium: 138 mmol/L (ref 135–145)
Total Bilirubin: 1 mg/dL (ref 0.3–1.2)
Total Protein: 8 g/dL (ref 6.5–8.1)

## 2019-10-09 LAB — CBC WITH DIFFERENTIAL/PLATELET
Abs Immature Granulocytes: 0.02 10*3/uL (ref 0.00–0.07)
Basophils Absolute: 0.1 10*3/uL (ref 0.0–0.1)
Basophils Relative: 1 %
Eosinophils Absolute: 0 10*3/uL (ref 0.0–0.5)
Eosinophils Relative: 0 %
HCT: 43.1 % (ref 39.0–52.0)
Hemoglobin: 14.8 g/dL (ref 13.0–17.0)
Immature Granulocytes: 0 %
Lymphocytes Relative: 7 %
Lymphs Abs: 0.6 10*3/uL — ABNORMAL LOW (ref 0.7–4.0)
MCH: 31.7 pg (ref 26.0–34.0)
MCHC: 34.3 g/dL (ref 30.0–36.0)
MCV: 92.3 fL (ref 80.0–100.0)
Monocytes Absolute: 0.9 10*3/uL (ref 0.1–1.0)
Monocytes Relative: 12 %
Neutro Abs: 6.2 10*3/uL (ref 1.7–7.7)
Neutrophils Relative %: 80 %
Platelets: 162 10*3/uL (ref 150–400)
RBC: 4.67 MIL/uL (ref 4.22–5.81)
RDW: 14.1 % (ref 11.5–15.5)
WBC: 7.7 10*3/uL (ref 4.0–10.5)
nRBC: 0 % (ref 0.0–0.2)

## 2019-10-09 IMAGING — CR DG CHEST 2V
3 series · 3 of 3 positions shown · non-contrast
Comparison: Chest radiograph dated [DATE].

CLINICAL DATA: 64-year-old male with hypoxia.

EXAM:
CHEST - 2 VIEW

[w chest lat]
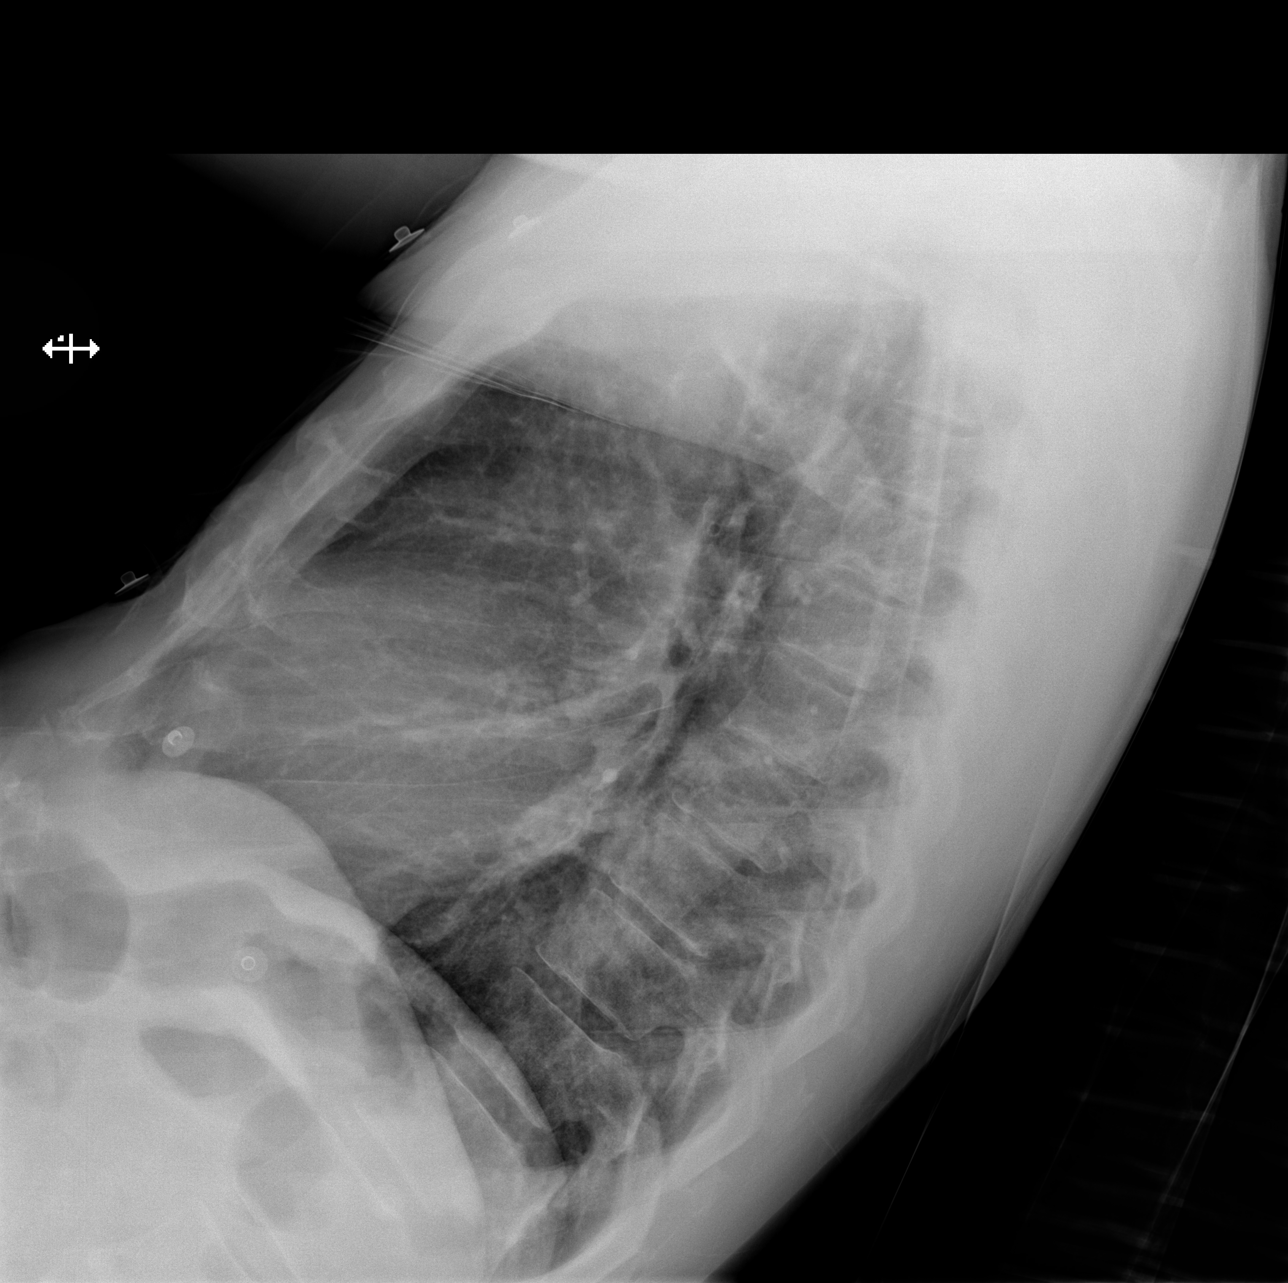

[x chest ap (1 of 2)]
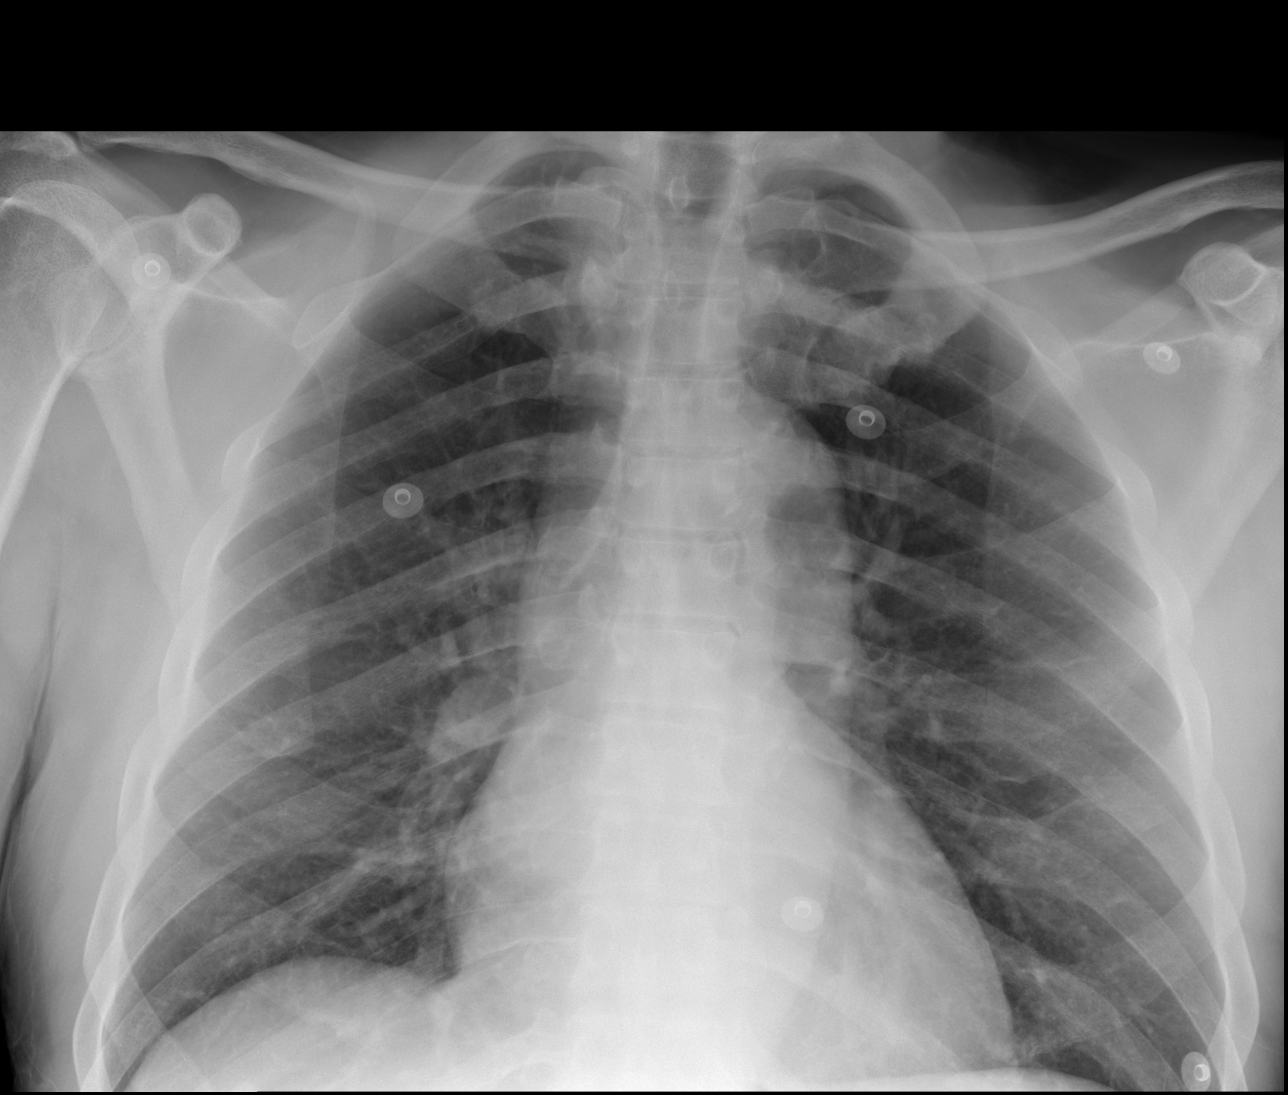

[x chest ap (2 of 2)]
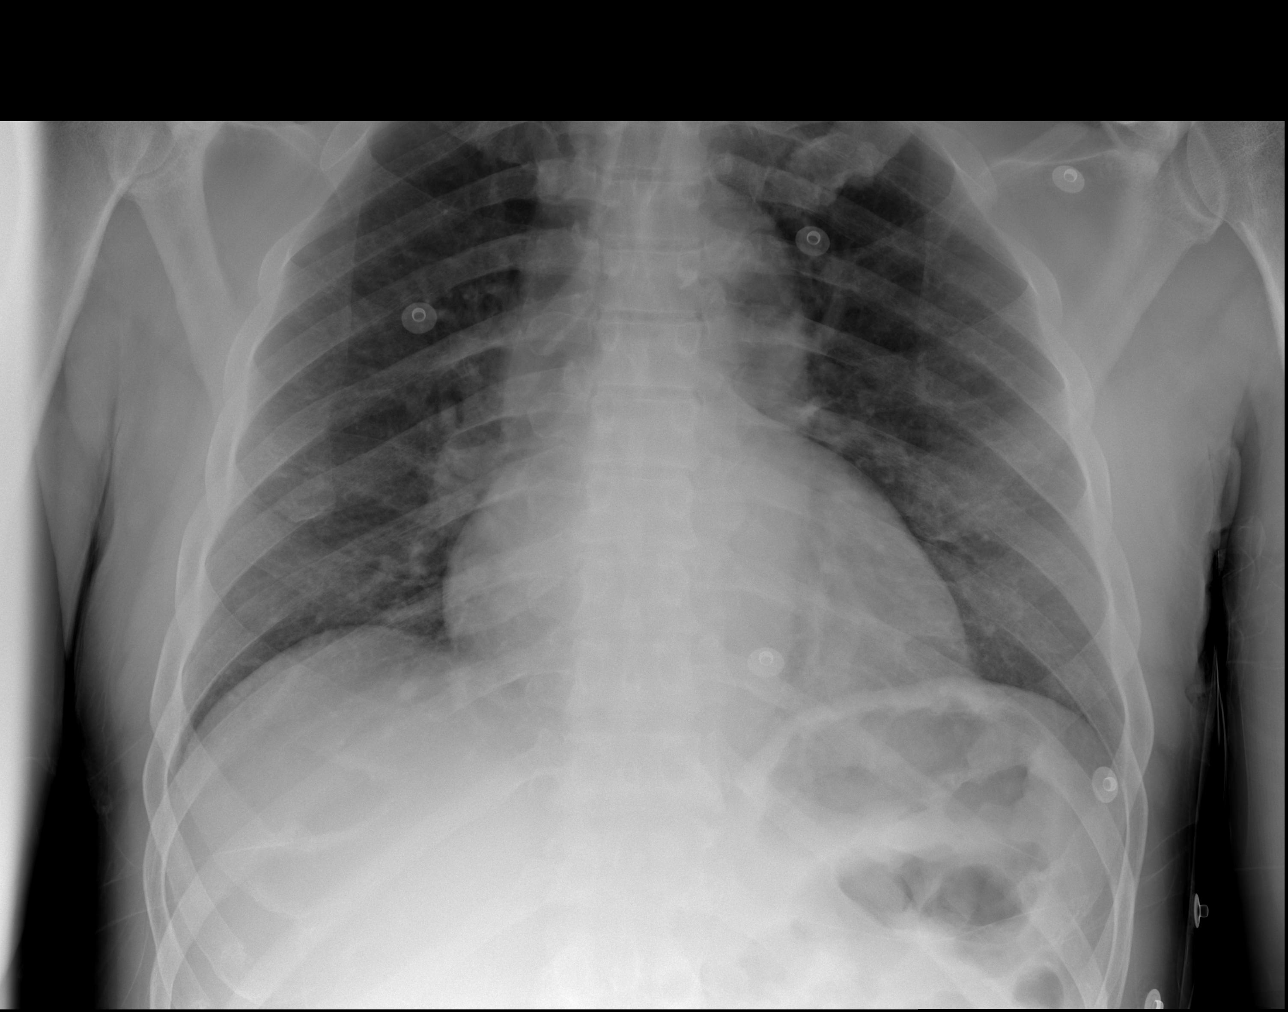

[3 of 3 positions shown; findings below may reference images not displayed]

FINDINGS: No focal consolidation, pleural effusion, or pneumothorax. The
cardiac silhouette is within limits. No acute osseous pathology.
IMPRESSION: No active cardiopulmonary disease.

## 2019-10-09 MED ORDER — ONDANSETRON HCL 4 MG/2ML IJ SOLN
4.0000 mg | Freq: Once | INTRAMUSCULAR | Status: AC
  Administered 2019-10-09: 4 mg via INTRAVENOUS
  Filled 2019-10-09: qty 2

## 2019-10-09 NOTE — ED Provider Notes (Signed)
[facility]-EMERGENCY DEPT Provider Note   CSN: [id number] Arrival date & time: 10/09/19  1801     History Chief Complaint  Patient presents with  . Alcohol Intoxication    Dominic Carter is a 64 y.o. male with past medical history of hypertension, left BKA, presenting to the emergency department via EMS after neighbor called out for patient lying in front of his a door intoxicated.  He reportedly has a prosthetic for his left BKA, however is not wearing at the time.  Patient states he has no current complaints, and is not sure if he drank alcohol today.  History is limited secondary to intoxication.  The history is provided by the patient.       Past Medical History:  Diagnosis Date  . DVT (deep venous thrombosis) (HCC)   . Hypertension     There are no problems to display for this patient.   Past Surgical History:  Procedure Laterality Date  . LEG AMPUTATION         No family history on file.  Social History   Tobacco Use  . Smoking status: Current Every Day Smoker    Packs/day: 0.50    Types: Cigarettes  . Smokeless tobacco: Never Used  Vaping Use  . Vaping Use: Never used  Substance Use Topics  . Alcohol use: Yes  . Drug use: No    Home Medications Prior to Admission medications   Medication Sig Start Date End Date Taking? Authorizing Provider  cyclobenzaprine (FLEXERIL) 10 MG tablet Take 1 tablet (10 mg total) by mouth 2 (two) times daily as needed for muscle spasms. 03/02/16   Isa Rankin, MD  methocarbamol (ROBAXIN) 500 MG tablet Take 1 tablet (500 mg total) by mouth at bedtime as needed. 05/04/17   Mathews Robinsons B, PA-C  naproxen (NAPROSYN) 500 MG tablet Take 1 tablet (500 mg total) by mouth 2 (two) times daily with a meal. 05/04/17   Mathews Robinsons B, PA-C  traMADol (ULTRAM) 50 MG tablet Take 1 tablet (50 mg total) by mouth every 6 (six) hours as needed. 03/02/16   Isa Rankin, MD    Allergies    Patient has no known  allergies.  Review of Systems   Review of Systems  Unable to perform ROS: Other (intoxication)    Physical Exam Updated Vital Signs BP (!) 155/75 (BP Location: Left Arm)   Pulse 90   Temp 98.3 F (36.8 C) (Oral)   Resp 15   SpO2 94%   Physical Exam Vitals and nursing note reviewed.  Constitutional:      General: He is not in acute distress.    Appearance: He is well-developed.  HENT:     Head: Normocephalic and atraumatic.  Eyes:     Conjunctiva/sclera: Conjunctivae normal.  Cardiovascular:     Rate and Rhythm: Normal rate and regular rhythm.  Pulmonary:     Effort: Pulmonary effort is normal. No respiratory distress.     Breath sounds: Rhonchi present.     Comments: O2 sat is 95% on room air.  Normal respiratory effort. Abdominal:     General: Bowel sounds are normal.     Palpations: Abdomen is soft.     Tenderness: There is no abdominal tenderness.  Musculoskeletal:     Comments: Left BKA  Skin:    General: Skin is warm.  Neurological:     Mental Status: He is alert.     Comments: He is oriented to person.  Psychiatric:        Behavior: Behavior normal.     ED Results / Procedures / Treatments   Labs (all labs ordered are listed, but only abnormal results are displayed) Labs Reviewed  COMPREHENSIVE METABOLIC PANEL - Abnormal; Notable for the following components:      Result Value   Potassium 3.3 (*)    BUN 7 (*)    Calcium 8.5 (*)    AST 63 (*)    All other components within normal limits  CBC WITH DIFFERENTIAL/PLATELET - Abnormal; Notable for the following components:   Lymphs Abs 0.6 (*)    All other components within normal limits    EKG None  Radiology DG Chest 2 View  Result Date: 10/09/2019 CLINICAL DATA:  64 year old male with hypoxia. EXAM: CHEST - 2 VIEW COMPARISON:  Chest radiograph dated 03/02/2016. FINDINGS: No focal consolidation, pleural effusion, or pneumothorax. The cardiac silhouette is within limits. No acute osseous pathology.  IMPRESSION: No active cardiopulmonary disease. Electronically Signed   By: Elgie Collard M.D.   On: 10/09/2019 19:47    Procedures Procedures (including critical care time)  Medications Ordered in ED Medications  ondansetron (ZOFRAN) injection 4 mg (4 mg Intravenous Given 10/09/19 1944)    ED Course  I have reviewed the triage vital signs and the nursing notes.  Pertinent labs & imaging results that were available during my care of the patient were reviewed by me and considered in my medical decision making (see chart for details).  Clinical Course as of Oct 08 2100  Wynelle Link Oct 09, 2019  2100 Patient reevaluated.  He is in no distress.  Endorses alcohol use today.  Remains without complaints.  O2 saturation is 97% on room air on evaluation.  He has a friend he can call for a ride.   [JR]    Clinical Course User Index [JR] Dim Meisinger, [place] N, PA-C   MDM Rules/Calculators/A&P                          Patient is brought in by EMS from home after neighbor called for intoxication.  History is somewhat limited secondary to intoxication, however he is in no acute distress, arousable to verbal stimuli.  He was noted to be hypoxic on room air on arrival, however on my evaluation he is on room air satting 95% with normal effort.  His lungs are somewhat rhonchorous though he is in no respiratory distress.   On reevaluation, he is more alert and oriented to place and month.He denies shortness of breath or any new cough.  Chest x-ray is negative for infiltrate.  Metabolic panel with mild hypokalemia 3.3.  CBC is unremarkable.  Patient reevaluated once more, he endorses alcohol use today.  He remains without complaints.  O2 saturation is 97% on room air.  He states he has a friend that can come pick him up.  He is appropriate for discharge.  Patient discussed with Dr. Rhunette Croft.  Final Clinical Impression(s) / ED Diagnoses Final diagnoses:  Alcoholic intoxication without complication [facility])    Rx  / DC Orders ED Discharge Orders    None       Solangel Mcmanaway, [place] N, PA-C 10/09/19 2102    Derwood Kaplan, MD 10/10/19 [phone]

## 2019-10-09 NOTE — ED Notes (Signed)
Provider notified writer that patient will be metabolize to [facility]

## 2019-10-09 NOTE — ED Triage Notes (Signed)
BIB EMS after neighbor called, pt lying in front of door intoxicated, pt has walker and one shoe (prostethic leg is at residence)  138/60-115-CBG 126 98%-20

## 2019-10-09 NOTE — ED Notes (Addendum)
Pt's oxygen saturation noted to be 89% on room air. RN made aware and pt was placed on 2lpm via [place].  Saturation increased to 94%.

## 2020-01-19 ENCOUNTER — Emergency Department (HOSPITAL_COMMUNITY): Payer: Medicare Other

## 2020-01-19 ENCOUNTER — Encounter (HOSPITAL_COMMUNITY): Payer: Self-pay

## 2020-01-19 ENCOUNTER — Other Ambulatory Visit: Payer: Self-pay

## 2020-01-19 ENCOUNTER — Inpatient Hospital Stay (HOSPITAL_COMMUNITY)
Admission: EM | Admit: 2020-01-19 | Discharge: 2020-01-27 | DRG: 101 | Disposition: A | Discharge status: Skilled Nursing Facility | Payer: Medicare Other | Attending: Internal Medicine | Admitting: Internal Medicine

## 2020-01-19 DIAGNOSIS — G40909 Epilepsy, unspecified, not intractable, without status epilepticus: Principal | ICD-10-CM | POA: Diagnosis present

## 2020-01-19 DIAGNOSIS — Y902 Blood alcohol level of 40-59 mg/100 ml: Secondary | ICD-10-CM | POA: Diagnosis present

## 2020-01-19 DIAGNOSIS — Z79899 Other long term (current) drug therapy: Secondary | ICD-10-CM

## 2020-01-19 DIAGNOSIS — I4581 Long QT syndrome: Secondary | ICD-10-CM | POA: Diagnosis present

## 2020-01-19 DIAGNOSIS — Z781 Physical restraint status: Secondary | ICD-10-CM

## 2020-01-19 DIAGNOSIS — R569 Unspecified convulsions: Secondary | ICD-10-CM | POA: Diagnosis not present

## 2020-01-19 DIAGNOSIS — K7010 Alcoholic hepatitis without ascites: Secondary | ICD-10-CM | POA: Diagnosis present

## 2020-01-19 DIAGNOSIS — I69354 Hemiplegia and hemiparesis following cerebral infarction affecting left non-dominant side: Secondary | ICD-10-CM

## 2020-01-19 DIAGNOSIS — Z86718 Personal history of other venous thrombosis and embolism: Secondary | ICD-10-CM

## 2020-01-19 DIAGNOSIS — E872 Acidosis, unspecified: Secondary | ICD-10-CM

## 2020-01-19 DIAGNOSIS — Z5309 Procedure and treatment not carried out because of other contraindication: Secondary | ICD-10-CM

## 2020-01-19 DIAGNOSIS — K760 Fatty (change of) liver, not elsewhere classified: Secondary | ICD-10-CM | POA: Diagnosis present

## 2020-01-19 DIAGNOSIS — F10139 Alcohol abuse with withdrawal, unspecified: Secondary | ICD-10-CM | POA: Diagnosis present

## 2020-01-19 DIAGNOSIS — Z7289 Other problems related to lifestyle: Secondary | ICD-10-CM

## 2020-01-19 DIAGNOSIS — Z789 Other specified health status: Secondary | ICD-10-CM

## 2020-01-19 DIAGNOSIS — I10 Essential (primary) hypertension: Secondary | ICD-10-CM | POA: Diagnosis present

## 2020-01-19 DIAGNOSIS — Z89511 Acquired absence of right leg below knee: Secondary | ICD-10-CM

## 2020-01-19 DIAGNOSIS — K7460 Unspecified cirrhosis of liver: Secondary | ICD-10-CM

## 2020-01-19 DIAGNOSIS — D696 Thrombocytopenia, unspecified: Secondary | ICD-10-CM | POA: Diagnosis present

## 2020-01-19 DIAGNOSIS — Z20822 Contact with and (suspected) exposure to covid-19: Secondary | ICD-10-CM | POA: Diagnosis present

## 2020-01-19 DIAGNOSIS — F17210 Nicotine dependence, cigarettes, uncomplicated: Secondary | ICD-10-CM | POA: Diagnosis present

## 2020-01-19 DIAGNOSIS — Z8673 Personal history of transient ischemic attack (TIA), and cerebral infarction without residual deficits: Secondary | ICD-10-CM

## 2020-01-19 DIAGNOSIS — G9389 Other specified disorders of brain: Secondary | ICD-10-CM | POA: Diagnosis present

## 2020-01-19 DIAGNOSIS — Z8249 Family history of ischemic heart disease and other diseases of the circulatory system: Secondary | ICD-10-CM

## 2020-01-19 DIAGNOSIS — F1010 Alcohol abuse, uncomplicated: Secondary | ICD-10-CM | POA: Diagnosis present

## 2020-01-19 DIAGNOSIS — I6930 Unspecified sequelae of cerebral infarction: Secondary | ICD-10-CM

## 2020-01-19 DIAGNOSIS — Z7982 Long term (current) use of aspirin: Secondary | ICD-10-CM

## 2020-01-19 DIAGNOSIS — E512 Wernicke's encephalopathy: Secondary | ICD-10-CM | POA: Diagnosis present

## 2020-01-19 DIAGNOSIS — E7220 Disorder of urea cycle metabolism, unspecified: Secondary | ICD-10-CM | POA: Diagnosis present

## 2020-01-19 DIAGNOSIS — E785 Hyperlipidemia, unspecified: Secondary | ICD-10-CM | POA: Diagnosis present

## 2020-01-19 HISTORY — DX: Hyperlipidemia, unspecified: E78.5

## 2020-01-19 LAB — CBC WITH DIFFERENTIAL/PLATELET
Abs Immature Granulocytes: 0.03 10*3/uL (ref 0.00–0.07)
Basophils Absolute: 0.1 10*3/uL (ref 0.0–0.1)
Basophils Relative: 1 %
Eosinophils Absolute: 0 10*3/uL (ref 0.0–0.5)
Eosinophils Relative: 0 %
HCT: 41.8 % (ref 39.0–52.0)
Hemoglobin: 13.8 g/dL (ref 13.0–17.0)
Immature Granulocytes: 0 %
Lymphocytes Relative: 21 %
Lymphs Abs: 1.4 10*3/uL (ref 0.7–4.0)
MCH: 31.8 pg (ref 26.0–34.0)
MCHC: 33 g/dL (ref 30.0–36.0)
MCV: 96.3 fL (ref 80.0–100.0)
Monocytes Absolute: 1 10*3/uL (ref 0.1–1.0)
Monocytes Relative: 15 %
Neutro Abs: 4.2 10*3/uL (ref 1.7–7.7)
Neutrophils Relative %: 63 %
Platelets: 124 10*3/uL — ABNORMAL LOW (ref 150–400)
RBC: 4.34 MIL/uL (ref 4.22–5.81)
RDW: 14 % (ref 11.5–15.5)
WBC: 6.8 10*3/uL (ref 4.0–10.5)
nRBC: 0 % (ref 0.0–0.2)

## 2020-01-19 LAB — COMPREHENSIVE METABOLIC PANEL
ALT: 41 U/L (ref 0–44)
AST: 82 U/L — ABNORMAL HIGH (ref 15–41)
Albumin: 3.8 g/dL (ref 3.5–5.0)
Alkaline Phosphatase: 99 U/L (ref 38–126)
Anion gap: 21 — ABNORMAL HIGH (ref 5–15)
BUN: 5 mg/dL — ABNORMAL LOW (ref 8–23)
CO2: 16 mmol/L — ABNORMAL LOW (ref 22–32)
Calcium: 8.7 mg/dL — ABNORMAL LOW (ref 8.9–10.3)
Chloride: 100 mmol/L (ref 98–111)
Creatinine, Ser: 0.87 mg/dL (ref 0.61–1.24)
GFR, Estimated: >60 mL/min (ref >60)
Glucose, Bld: 79 mg/dL (ref 70–99)
Potassium: 3.7 mmol/L (ref 3.5–5.1)
Sodium: 137 mmol/L (ref 135–145)
Total Bilirubin: 1 mg/dL (ref 0.3–1.2)
Total Protein: 7.4 g/dL (ref 6.5–8.1)

## 2020-01-19 LAB — ETHANOL: Alcohol, Ethyl (B): 46 mg/dL — ABNORMAL HIGH

## 2020-01-19 LAB — CBG MONITORING, ED: Glucose-Capillary: 79 mg/dL (ref 70–99)

## 2020-01-19 LAB — AMMONIA: Ammonia: 67 umol/L — ABNORMAL HIGH (ref 9–35)

## 2020-01-19 LAB — TROPONIN I (HIGH SENSITIVITY): Troponin I (High Sensitivity): 20 ng/L — ABNORMAL HIGH (ref <18)

## 2020-01-19 IMAGING — CT CT HEAD W/O CM
3 series · 15 of 47 positions shown, 18 images · non-contrast
Comparison: Head CT, CTA head and neck [DATE]

CLINICAL DATA: 64-year-old male status post unwitnessed seizure.

EXAM:
CT HEAD WITHOUT CONTRAST
TECHNIQUE: Contiguous axial images were obtained from the base of the skull
through the vertex without intravenous contrast.

[Series 3: head 5.0 h30s · axial · 0.43mm/px · z∈[-152,-27]mm · 9 of 31 slices shown, 12 images]
[im 3/31  brain]
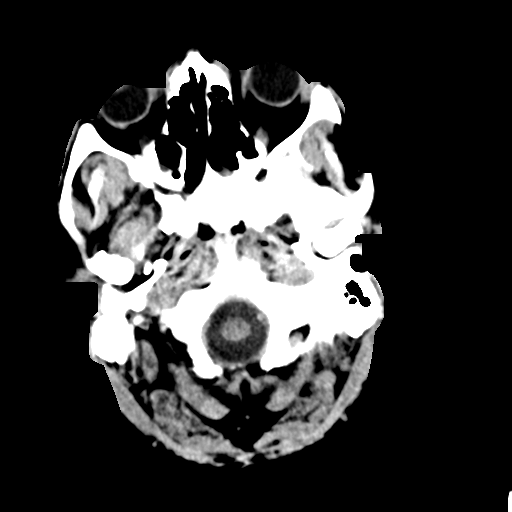
[im 3/31  bone]
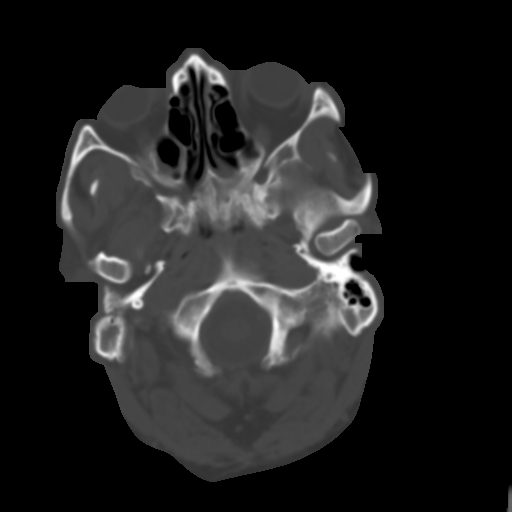
[im 6/31  brain]
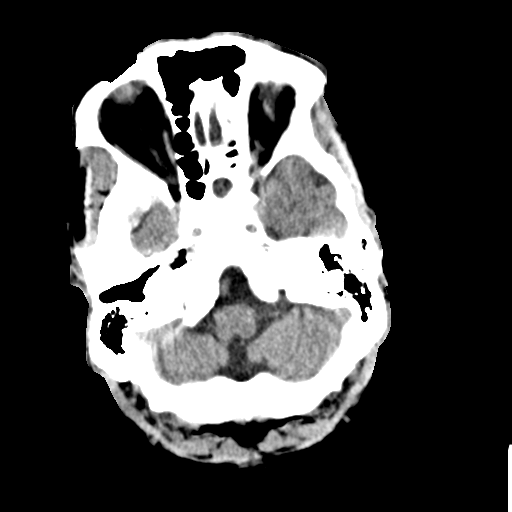
[im 9/31  brain]
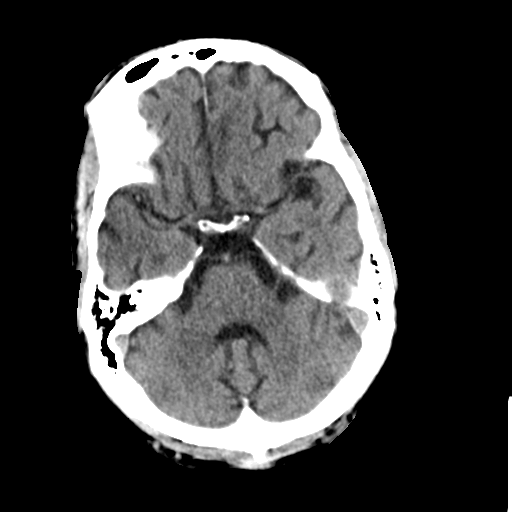
[im 12/31  brain]
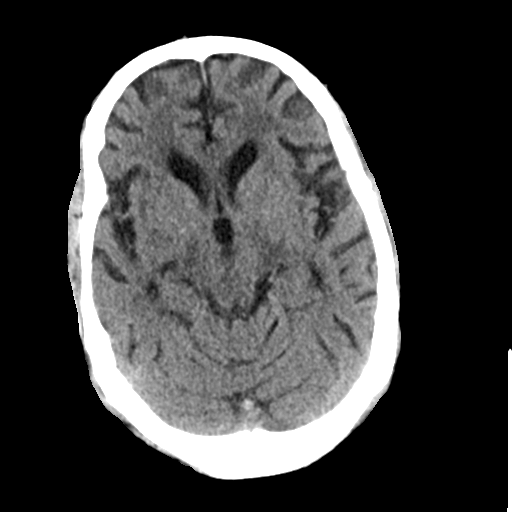
[im 16/31  brain]
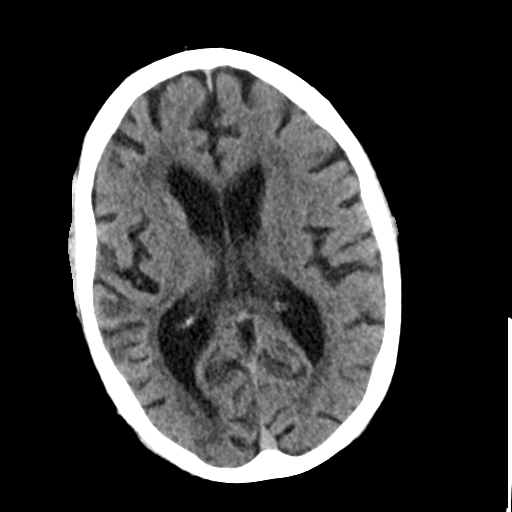
[im 16/31  bone]
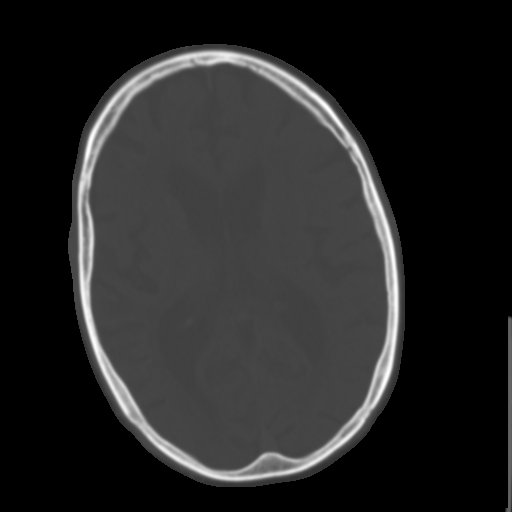
[im 19/31  brain]
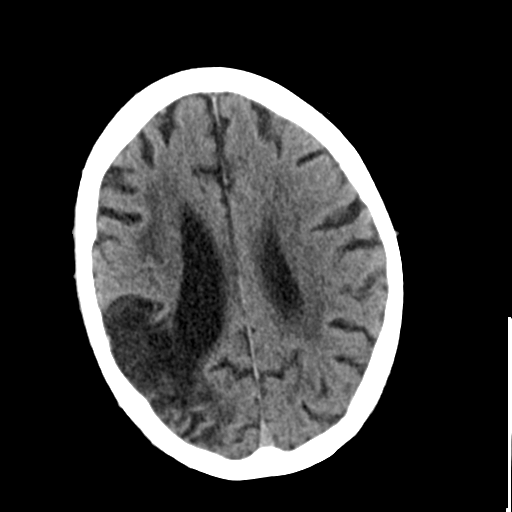
[im 22/31  brain]
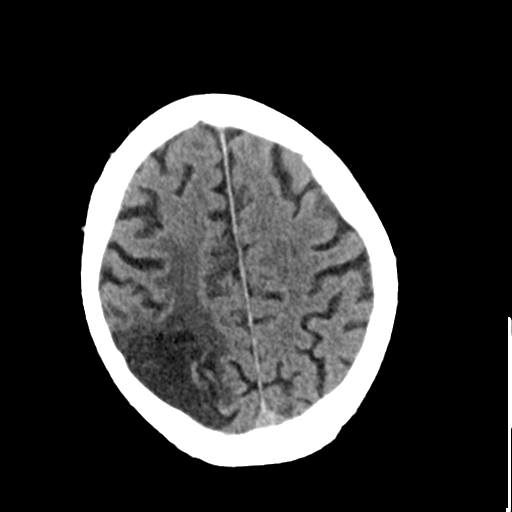
[im 25/31  brain]
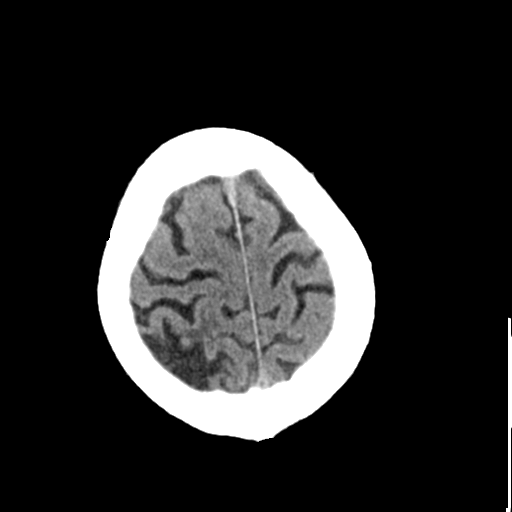
[im 28/31  brain]
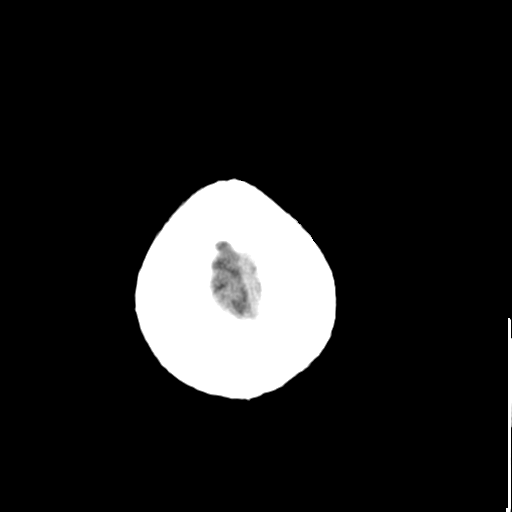
[im 28/31  bone]
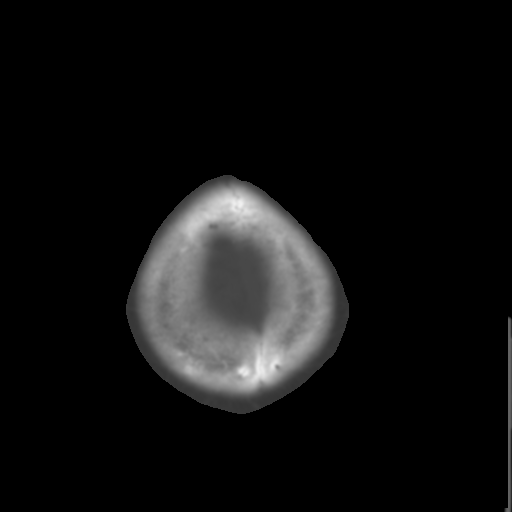

[Series 5: head 3.0 mpr cor · coronal · 0.31mm/px · 3 of 71 slices shown]
[im 24/71  brain]
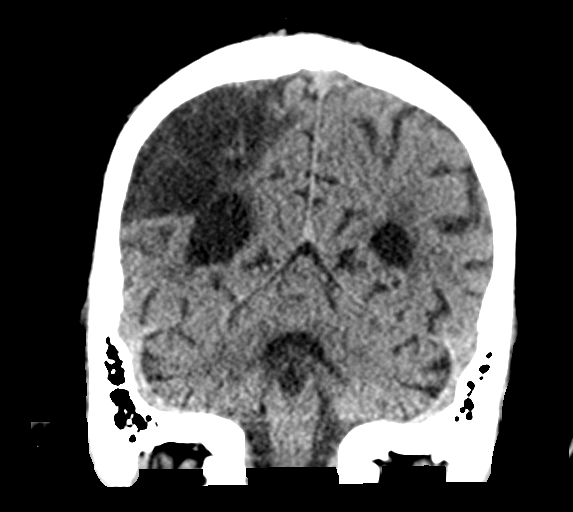
[im 32/71  brain]
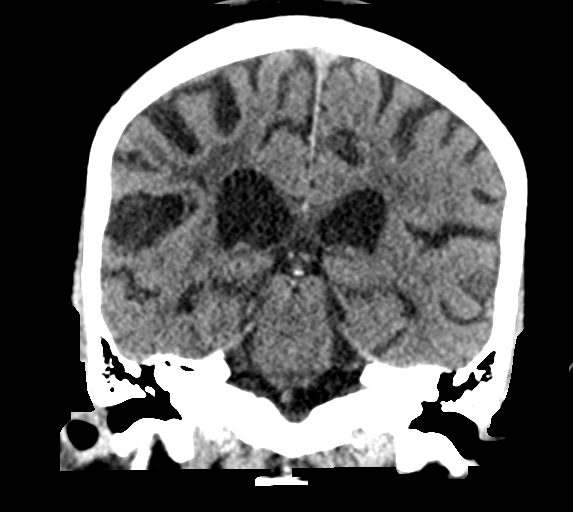
[im 39/71  brain]
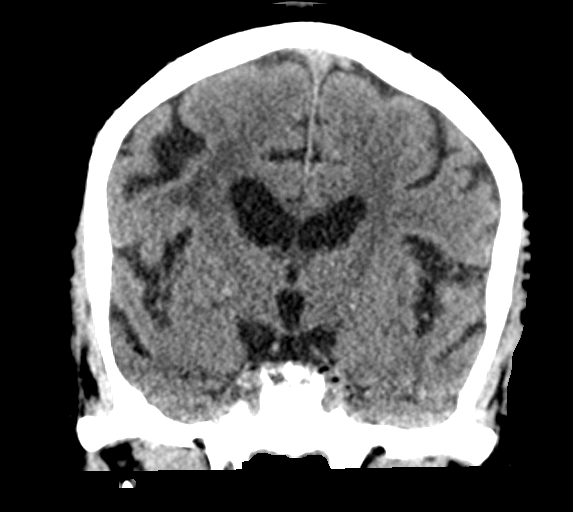

[Series 6: head 3.0 mpr sag · sagittal · 0.33mm/px · 3 of 58 slices shown]
[im 20/58  brain]
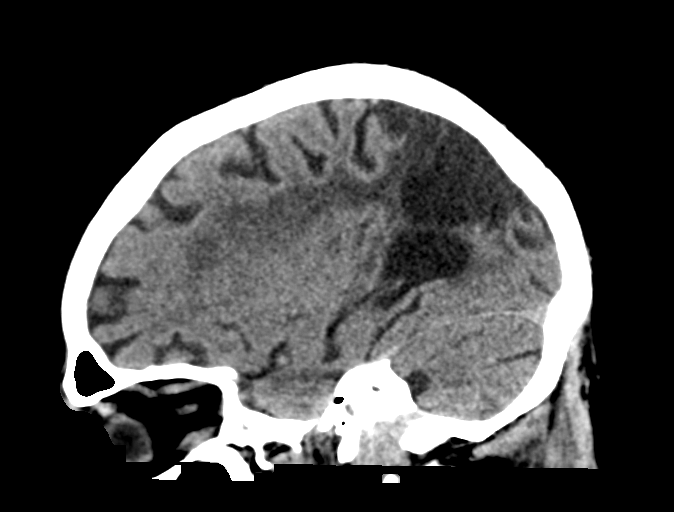
[im 29/58  brain]
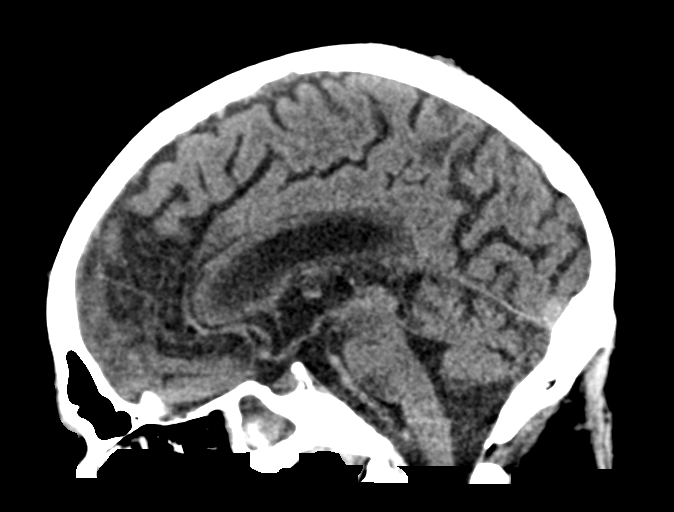
[im 39/58  brain]
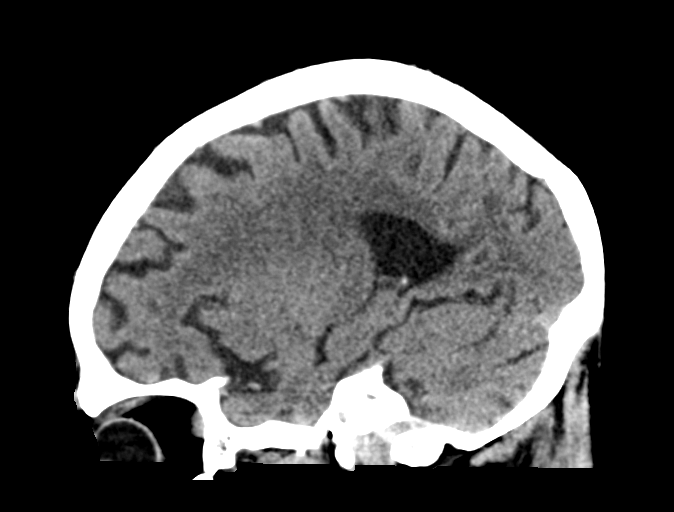

[15 of 47 positions shown; findings below may reference images not displayed]

FINDINGS: Brain: Moderate sized chronic infarct with encephalomalacia in the
posterior right MCA territory, new since [SN]. Mild ex vacuo
ventricular enlargement.

Progressed bilateral cerebral white matter hypodensity also since
[SN]. But otherwise stable gray-white matter differentiation.

No midline shift, ventriculomegaly, mass effect, evidence of mass
lesion, intracranial hemorrhage or evidence of cortically based
acute infarction. Gray-white matter differentiation is within normal
limits throughout the brain.

Vascular: Calcified atherosclerosis at the skull base. No suspicious
intracranial vascular hyperdensity.

Skull: Stable, intact.

Sinuses/Orbits: Progressed chronic sinus disease since [SN], most
affecting the sphenoid and right maxillary sinuses. Tympanic
cavities and mastoids remain clear.

Other: No acute orbit or scalp soft tissue injury.
IMPRESSION: 1. No acute intracranial abnormality or acute traumatic injury
identified.
2. Chronic posterior right MCA territory infarct, new since [DATE]. Progressed chronic paranasal sinus disease.

## 2020-01-19 IMAGING — DX DG CHEST 1V PORT
2 series · 2 of 2 positions shown · non-contrast
Comparison: None.

CLINICAL DATA: Fall, altered mental status, seizure

EXAM:
PORTABLE CHEST 1 VIEW

[chest ap (1 of 2)]
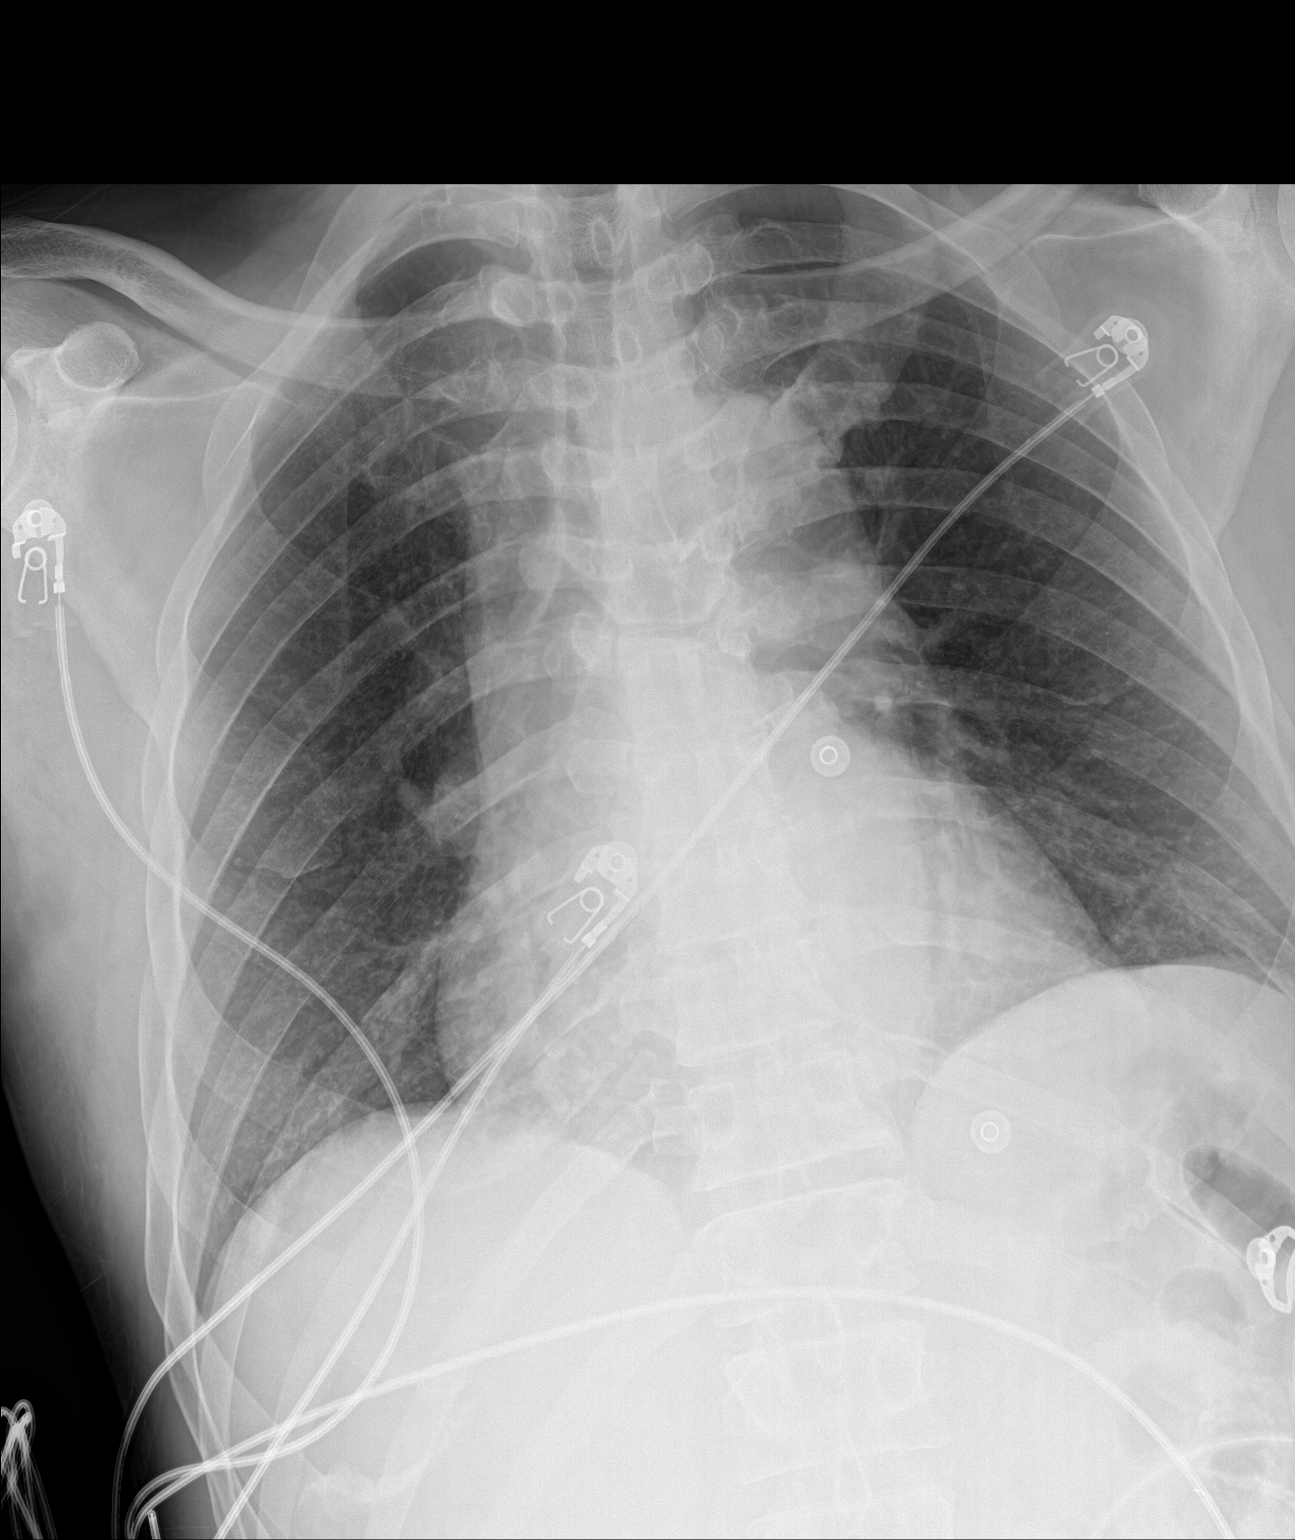

[chest ap (2 of 2)]
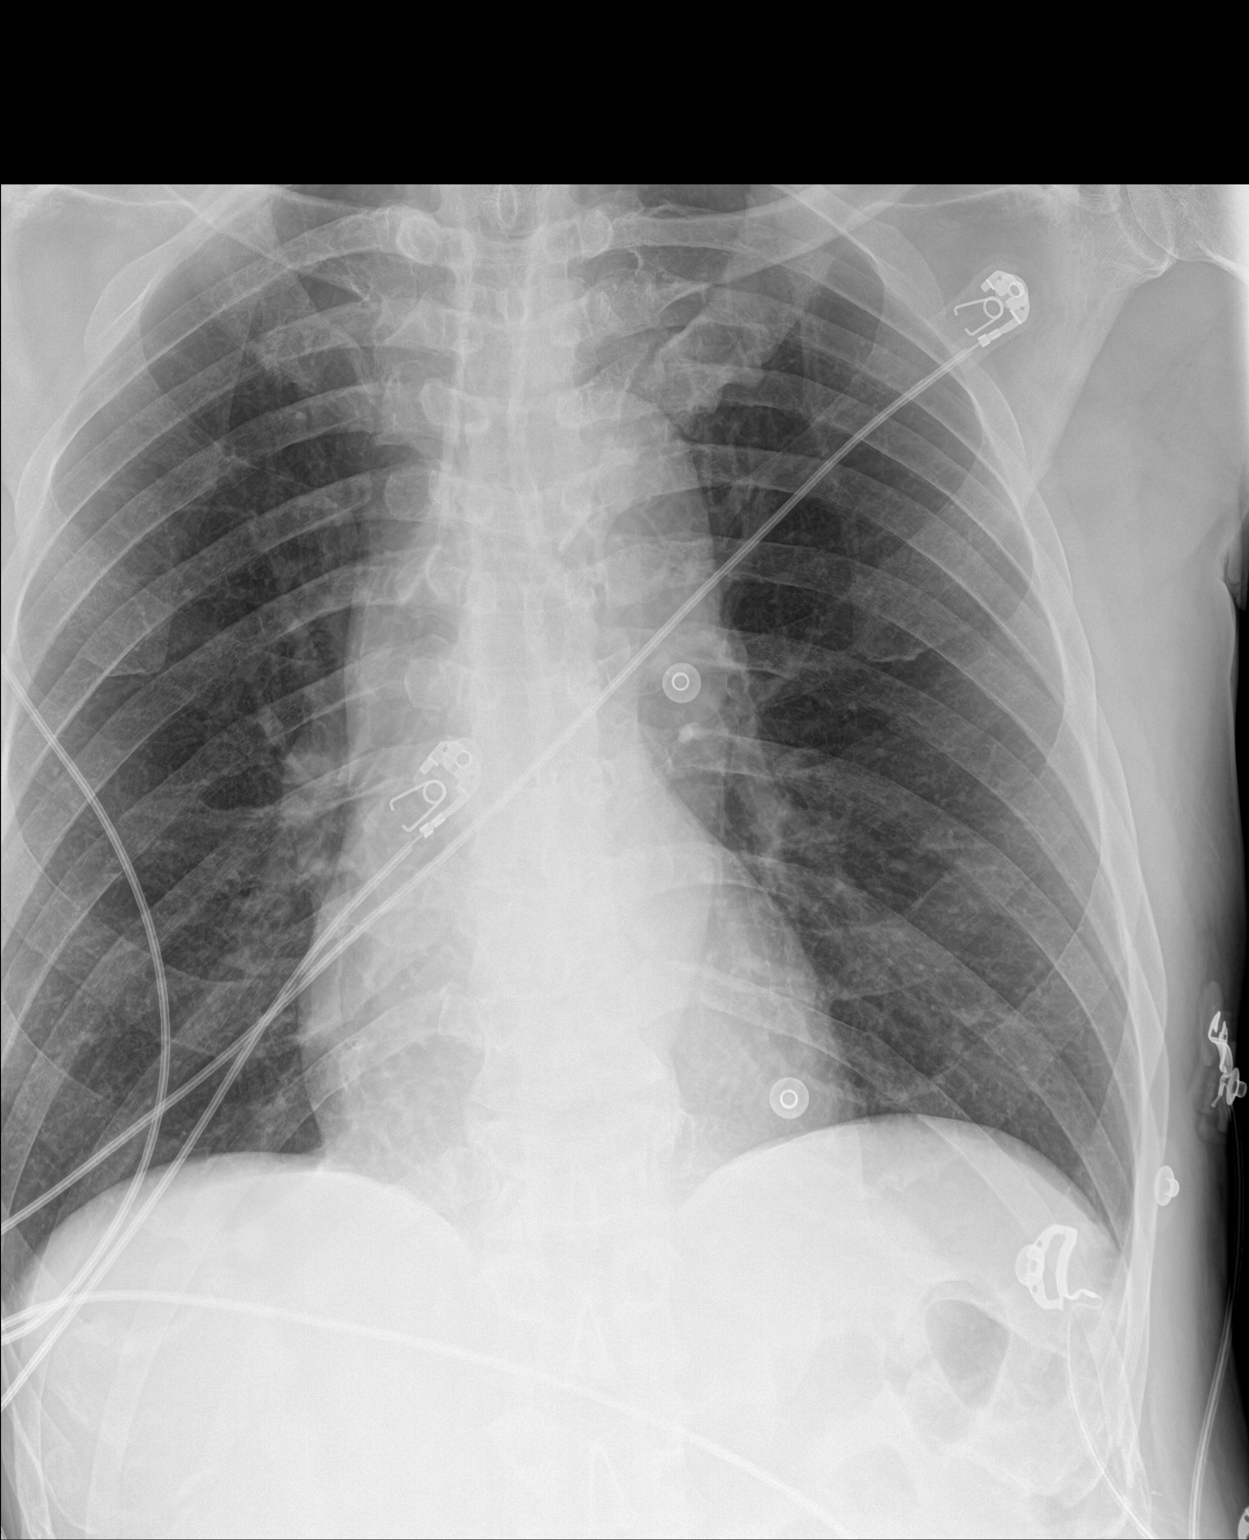

[2 of 2 positions shown; findings below may reference images not displayed]

FINDINGS: The heart size and mediastinal contours are within normal limits.
Both lungs are clear. The visualized skeletal structures are
unremarkable.
IMPRESSION: No active disease.

## 2020-01-19 MED ORDER — THIAMINE HCL 100 MG/ML IJ SOLN
Freq: Once | INTRAVENOUS | Status: AC
  Filled 2020-01-19: qty 1000

## 2020-01-19 MED ORDER — LEVETIRACETAM IN NACL 1000 MG/100ML IV SOLN
1000.0000 mg | Freq: Once | INTRAVENOUS | Status: AC
  Administered 2020-01-20: 1000 mg via INTRAVENOUS
  Filled 2020-01-19: qty 100

## 2020-01-19 MED ORDER — LORAZEPAM 2 MG/ML IJ SOLN: 1.0000 mg | Freq: Once | INTRAMUSCULAR | Status: DC

## 2020-01-19 MED ORDER — LACTULOSE 10 GM/15ML PO SOLN
20.0000 g | Freq: Once | ORAL | Status: AC
  Administered 2020-01-21: 20 g via ORAL
  Filled 2020-01-19 (×2): qty 30

## 2020-01-19 MED ORDER — LORAZEPAM 2 MG/ML IJ SOLN
INTRAMUSCULAR | Status: AC
  Administered 2020-01-19: 2 mg
  Filled 2020-01-19: qty 1

## 2020-01-19 MED ORDER — LACTATED RINGERS IV BOLUS
2000.0000 mL | Freq: Once | INTRAVENOUS | Status: AC
  Administered 2020-01-19: 2000 mL via INTRAVENOUS

## 2020-01-19 MED ORDER — ONDANSETRON HCL 4 MG/2ML IJ SOLN
4.0000 mg | Freq: Once | INTRAMUSCULAR | Status: AC
  Administered 2020-01-19: 4 mg via INTRAVENOUS
  Filled 2020-01-19: qty 2

## 2020-01-19 NOTE — ED Triage Notes (Signed)
Per EMS, pt had unwitnessed seizure. Family in house did not hear a fall/thud, pt denies pain and no obvious injuries. Pt has no previous hx of seizures. Pt is postictal, A&O X2- unable to state year or current president.

## 2020-01-19 NOTE — ED Provider Notes (Signed)
[facility] EMERGENCY DEPARTMENT Provider Note   CSN: [id number] Arrival date & time: 01/19/20  2152     History Chief Complaint  Patient presents with  . Seizures    Dominic Carter is a 64 y.o. 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  EMS was called out for seizure-like activity.  Patient was confused when EMS arrived, [place] x2.  No witnessed seizure like activity in route.  No treatment in route. Heart rate was noted to be 120s in route, but all other vital signs were reportedly normal per EMS.  Spoke with the patient's roommate's significant other, Landry Mellow, who reports that her 33 year old son was home and called EMS as she is currently out of town on a trip.  Her son informed her that the patient was standing in the doorway and was "trembling" all over and was unable to speak.  His eyes were open.  No urinary or fecal incontinence.  He did not fall or collapse.  She reports that the patient has had approximately 3-4 episodes of similar presentations in the last 6 years that she has lived with the patient.  During the last episode several months ago, she was able to have him take some deep breaths for several minutes then his symptoms resolved.  In the ER, he is confused.  He states he was having abdominal pain earlier today.Marland Kitchen  He is endorsing nausea.  Initially, patient was not diaphoretic, but has become increasingly diaphoretic since arrival in the ER.  He denies shortness of breath, chest pain, fever, chills, cough, vomiting, diarrhea.  He denies alcohol use today.  However, [name] reports that the patient drinks alcohol daily, unknown quantity.  Denies illicit or recreational substance use.  He is a current, every day 1 pack/days tobacco user.  [name] reports they have been trying to obtain him a right prosthesis.  He is oriented to name and birthdate.  He  does not know the city, state, president, month, year.  He knows he is at a hospital, but does not know the name of the facility.  Level 5 caveat secondary to altered mental status.  The history is provided by the patient, the EMS personnel and medical records. No language interpreter was used.       Past Medical History:  Diagnosis Date  . DVT (deep venous thrombosis) (HCC)   . Dyslipidemia   . Hypertension     Patient Active Problem List   Diagnosis Date Noted  . Seizure (HCC) 01/20/2020  . ETOH abuse 01/20/2020  . HTN (hypertension) 01/20/2020  . 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 BELOW KNEE LEG AMPUTATION Right        Family History  Problem Relation Age of Onset  . Hypertension Mother   . Hypertension Sister   . Hypertension Brother   . Hypertension Maternal Grandmother   . Hypertension Maternal Grandfather     Social History   Tobacco Use  . Smoking status: Current Every Day Smoker    Packs/day: 1.00    Types: Cigarettes  . Smokeless tobacco: Never Used  Vaping Use  . Vaping Use: Never used  Substance Use Topics  . Alcohol use: Yes  . Drug use: No    Home Medications Prior to Admission medications   Medication Sig Start Date End Date Taking? Authorizing  Provider  cyclobenzaprine (FLEXERIL) 10 MG tablet Take 1 tablet (10 mg total) by mouth 2 (two) times daily as needed for muscle spasms. 03/02/16   Isa Rankin, MD  methocarbamol (ROBAXIN) 500 MG tablet Take 1 tablet (500 mg total) by mouth at bedtime as needed. 05/04/17   Mathews Robinsons B, PA-C  naproxen (NAPROSYN) 500 MG tablet Take 1 tablet (500 mg total) by mouth 2 (two) times daily with a meal. 05/04/17   Mathews Robinsons B, PA-C  traMADol (ULTRAM) 50 MG tablet Take 1 tablet (50 mg total) by mouth every 6 (six) hours as needed. 03/02/16   Isa Rankin, MD    Allergies    Patient has no known allergies.  Review of Systems     Review of Systems  Unable to perform ROS: Mental status change    Physical Exam Updated Vital Signs BP (!) 151/80 (BP Location: Left Arm)   Pulse 80   Temp 98.1 F (36.7 C) (Oral)   Resp 18   Ht 5\' 11"  (1.803 m)   Wt 77.1 kg   SpO2 95%   BMI 23.71 kg/m   Physical Exam Vitals and nursing note reviewed.  Constitutional:      Appearance: He is well-developed. He is diaphoretic.     Comments: Chronically ill-appearing.  Nontoxic appearing.  HENT:     Head: Normocephalic.  Eyes:     Conjunctiva/sclera: Conjunctivae normal.  Cardiovascular:     Rate and Rhythm: Regular rhythm. Tachycardia present.     Pulses: Normal pulses.     Heart sounds: Normal heart sounds. No murmur heard.  No friction rub. No gallop.   Pulmonary:     Effort: Pulmonary effort is normal.     Comments: No increased work of breathing. Abdominal:     General: There is no distension.     Palpations: Abdomen is soft. There is no mass.     Tenderness: There is no abdominal tenderness. There is no right CVA tenderness, left CVA tenderness, guarding or rebound.     Hernia: No hernia is present.     Comments: Abdomen is soft, nontender, nondistended.  Musculoskeletal:     Cervical back: Neck supple.     Comments: RLE BJA.  No peripheral edema to the left lower extremity.  Skin:    General: Skin is warm.     Coloration: Skin is not jaundiced.  Neurological:     Mental Status: He is alert.     Comments: Alert.  Oriented to self.  He is aware that he is at a hospital, but does not know the name.  He is not oriented to month, year, city, state, or president.  Follows simple commands.  No dysmetria with finger-to-nose bilaterally, but there is a mild tremor in the bilateral hands. No asterixis.  Cranial nerves II through XII are grossly intact.  Patient is able to use his bilateral upper extremities to pull himself up in the bed.  5-5 strength against resistance of the bilateral upper and lower extremities.   Sensation is intact and equal throughout.  No pronator drift.  Gait exam deferred at this time.  Speech is not slurred.  He will only intermittently answer questions that he has been asked.  Psychiatric:        Behavior: Behavior normal.     ED Results / Procedures / Treatments   Labs (all labs ordered are listed, but only abnormal results are displayed) Labs Reviewed  COMPREHENSIVE METABOLIC PANEL - Abnormal;  Notable for the following components:      Result Value   CO2 16 (*)    BUN 5 (*)    Calcium 8.7 (*)    AST 82 (*)    Anion gap 21 (*)    All other components within normal limits  CBC WITH DIFFERENTIAL/PLATELET - Abnormal; Notable for the following components:   Platelets 124 (*)    All other components within normal limits  URINALYSIS, COMPLETE (UACMP) WITH MICROSCOPIC - Abnormal; Notable for the following components:   Hgb urine dipstick SMALL (*)    Ketones, ur 5 (*)    Protein, ur 100 (*)    All other components within normal limits  ETHANOL - Abnormal; Notable for the following components:   Alcohol, Ethyl (B) 46 (*)    All other components within normal limits  AMMONIA - Abnormal; Notable for the following components:   Ammonia 67 (*)    All other components within normal limits  PHOSPHORUS - Abnormal; Notable for the following components:   Phosphorus 2.0 (*)    All other components within normal limits  LIPID PANEL - Abnormal; Notable for the following components:   Cholesterol 219 (*)    LDL Cholesterol 129 (*)    All other components within normal limits  TROPONIN I (HIGH SENSITIVITY) - Abnormal; Notable for the following components:   Troponin I (High Sensitivity) 20 (*)    All other components within normal limits  TROPONIN I (HIGH SENSITIVITY) - Abnormal; Notable for the following components:   Troponin I (High Sensitivity) 23 (*)    All other components within normal limits  RESPIRATORY PANEL BY RT PCR (FLU A&B, COVID)  RAPID URINE DRUG SCREEN, HOSP  PERFORMED  MAGNESIUM  HIV ANTIBODY (ROUTINE TESTING W REFLEX)  HEMOGLOBIN A1C  CBG MONITORING, ED    EKG EKG Interpretation  Date/Time:  Thursday January 19 2020 22:17:57 EDT Ventricular Rate:  93 PR Interval:    QRS Duration: 84 QT Interval:  389 QTC Calculation: 484 R Axis:   8 Text Interpretation: Sinus rhythm Abnormal R-wave progression, early transition Borderline prolonged QT interval No significant change since last tracing Confirmed by Zadie Rhine ([id number]) on 01/19/2020 11:12:32 PM   Radiology DG Abd 1 View  Result Date: 01/20/2020 CLINICAL DATA:  Seizures EXAM: ABDOMEN - 1 VIEW COMPARISON:  None. FINDINGS: Normal abdominal gas pattern. No organomegaly. Vascular stents are seen within the right common iliac artery and terminal aorta. No unexpected metallic foreign body within the visualized abdomen. IMPRESSION: No unexpected metallic foreign body within the visualized abdomen. Electronically Signed   By: Helyn Numbers MD   On: 01/20/2020 03:26   CT Head Wo Contrast  Result Date: 01/19/2020 CLINICAL DATA:  64 year old male status post unwitnessed seizure. EXAM: CT HEAD WITHOUT CONTRAST TECHNIQUE: Contiguous axial images were obtained from the base of the skull through the vertex without intravenous contrast. COMPARISON:  Head CT, CTA head and neck 03/02/2016 FINDINGS: Brain: Moderate sized chronic infarct with encephalomalacia in the posterior right MCA territory, new since 2017. Mild ex vacuo ventricular enlargement. Progressed bilateral cerebral white matter hypodensity also since 2017. But otherwise stable gray-white matter differentiation. No midline shift, ventriculomegaly, mass effect, evidence of mass lesion, intracranial hemorrhage or evidence of cortically based acute infarction. Gray-white matter differentiation is within normal limits throughout the brain. Vascular: Calcified atherosclerosis at the skull base. No suspicious intracranial vascular hyperdensity.  Skull: Stable, intact. Sinuses/Orbits: Progressed chronic sinus disease since 2017, most affecting the sphenoid and  right maxillary sinuses. Tympanic cavities and mastoids remain clear. Other: No acute orbit or scalp soft tissue injury. IMPRESSION: 1. No acute intracranial abnormality or acute traumatic injury identified. 2. Chronic posterior right MCA territory infarct, new since 2017. 3. Progressed chronic paranasal sinus disease. Electronically Signed   By: Odessa Fleming M.D.   On: 01/19/2020 23:13   MR BRAIN WO CONTRAST  Result Date: 01/20/2020 CLINICAL DATA:  Stroke, follow-up.  Seizure. EXAM: MRI HEAD WITHOUT CONTRAST TECHNIQUE: Multiplanar, multiecho pulse sequences of the brain and surrounding structures were obtained without intravenous contrast. The examination had to be discontinued prior to completion due to patient would not tolerate additional sequences. COMPARISON:  CT head without contrast 01/19/2020 FINDINGS: Brain: Remote posterior right MCA territory infarct is confirmed. Ex vacuo dilation of the right lateral ventricle is present. Periventricular white matter changes are moderately advanced for age. Asymmetric changes are present in the right corona radiata. No acute infarct, hemorrhage, or mass lesion is present. The ventricles are proportionate to the degree of atrophy. No significant extraaxial fluid collection is present. Brainstem demonstrates wallerian changes along the right cortical spinal tracts. Remote lacunar infarcts are present within the cerebellum bilaterally. Vascular: Flow is present in the major intracranial arteries. Skull and upper cervical spine: The craniocervical junction is normal. Upper cervical spine is within normal limits. Marrow signal is unremarkable. Sinuses/Orbits: Right maxillary bilateral posterior ethmoid and sphenoid sinus disease is chronic. The globes and orbits are within normal limits. Mastoid air cells are clear. IMPRESSION: 1. No acute intracranial  abnormality. 2. Remote posterior right MCA territory infarct. 3. Remote lacunar infarcts of the cerebellum bilaterally. 4. Atrophy and white matter disease is moderately advanced for age. This likely reflects the sequela of chronic microvascular ischemia. 5. 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 Electronically Signed   By: Marin Roberts M.D.   On: 01/20/2020 05:09   DG Chest Portable 1 View  Result Date: 01/19/2020 CLINICAL DATA:  Fall, altered mental status, seizure EXAM: PORTABLE CHEST 1 VIEW COMPARISON:  None. FINDINGS: The heart size and mediastinal contours are within normal limits. Both lungs are clear. The visualized skeletal structures are unremarkable. IMPRESSION: No active disease. Electronically Signed   By: Helyn Numbers MD   On: 01/19/2020 23:10    Procedures .Critical Care Performed by: Barkley Boards, PA-C Authorized by: Barkley Boards, PA-C   Critical care provider statement:    Critical care time (minutes):  50   Critical care time was exclusive of:  Separately billable procedures and treating other patients and teaching time   Critical care was necessary to treat or prevent imminent or life-threatening deterioration of the following conditions:  CNS failure or compromise   Critical care was time spent personally by me on the following activities:  Obtaining history from patient or surrogate, examination of patient, evaluation of patient's response to treatment, discussions with consultants, re-evaluation of patient's condition, review of old charts, pulse oximetry, ordering and review of radiographic studies, ordering and review of laboratory studies, ordering and performing treatments and interventions and development of treatment plan with patient or surrogate   I assumed direction of critical care for this patient from another provider in my specialty: no     (including critical care time)  Medications Ordered in  ED Medications  lactulose (CHRONULAC) 10 GM/15ML solution 20 g (0 g Oral Hold 01/20/20 0046)  LORazepam (ATIVAN) tablet 1-4 mg (has no administration in time range)    Or  LORazepam (ATIVAN) injection 1-4 mg (has no administration in time range)  thiamine tablet 100 mg (has no administration in time range)    Or  thiamine (B-1) injection 100 mg (has no administration in time range)  folic acid (FOLVITE) tablet 1 mg (has no administration in time range)  multivitamin with minerals tablet 1 tablet (has no administration in time range)   stroke: mapping our early stages of recovery book (has no administration in time range)  acetaminophen (TYLENOL) tablet 650 mg (has no administration in time range)    Or  acetaminophen (TYLENOL) 160 MG/5ML solution 650 mg (has no administration in time range)    Or  acetaminophen (TYLENOL) suppository 650 mg (has no administration in time range)  levETIRAcetam (KEPPRA) IVPB 500 mg/100 mL premix (has no administration in time range)  enoxaparin (LOVENOX) injection 40 mg (has no administration in time range)  aspirin suppository 300 mg (has no administration in time range)    Or  aspirin tablet 325 mg (has no administration in time range)  ondansetron (ZOFRAN) injection 4 mg (4 mg Intravenous Given 01/19/20 2356)  lactated ringers bolus 2,000 mL (2,000 mLs Intravenous New Bag/Given 01/19/20 2332)  LORazepam (ATIVAN) 2 MG/ML injection (2 mg  Given 01/19/20 2330)  levETIRAcetam (KEPPRA) IVPB 1000 mg/100 mL premix (0 mg Intravenous Stopped 01/20/20 0035)  dextrose 5 % and 0.45% NaCl 1,000 mL with thiamine 100 mg, folic acid 1 mg, multivitamins adult 10 mL, magnesium sulfate 2 g infusion ( Intravenous Stopped 01/20/20 0534)    ED Course  I have reviewed the triage vital signs and the nursing notes.  Pertinent labs & imaging results that were available during my care of the patient were reviewed by me and considered in my medical decision making (see chart for  details).    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  Initial call to EMS was for seizure-like activity.  In the ER, he is confused, but is alert.  GCS 14.  EMS rhythm strip noted with heart rate in the 120s.  His initially minimally tachycardic at 104 on arrival, but had no further episodes of tachycardia.  His mildly hypertensive.  No tachypnea.  He is afebrile.  No hypoxia.  He had observed seizure-like activity in route with EMS.  No interventions in route with EMS.  Roommate reports that he has had similar episodes similar to what her child described that he witnessed tonight over the last few years.  He is alert but confused.  Per chart review, his last ER visit in July was for alcohol intoxication.  No history of seizures.  Patient reports that he has not drink today, but roommates report that he drinks daily.  Confusion could be secondary to Wernicke's encephalopathy, hyperammonemia, improving postictal state after seizure, or sepsis of undetermined etiology.  The patient was seen and independently evaluated by Dr. Bebe ShaggyWickline, attending physician.  He has a metabolic acidosis, which could be secondary to alcoholic ketoacidosis given reported history of drinking, but could also consider lactic acidosis.  Although AST/ALT is 2:1, he has no hypochloremia or hyponatremia, which is frequently seen with heavy, daily alcohol use.  He does have minimal ketonuria on his UA.  No evidence of UTI.  Ammonia is elevated to 67.  And  he does have a mild thrombocytopenia at 124.  Will order lactulose.  We will start the patient on a banana bag with dextrose 5% and half-normal saline given concern for alcohol withdrawal seizure and Warnicke's encephalopathy.   Imaging has been independently reviewed and evaluated by me. Chest x-ray without acute abnormality. CT head without acute  intracranial abnormality or acute traumatic injury, but there is a chronic posterior right MCA territory infarct that is new since 2017.  No history of CVA noted on the patient's chart.  He does not have any neurologic deficits today on my exam.  While in the ER, patient had generalized seizure that was observed by Dr. Bebe Shaggy, attending physician, that terminated spontaneously.  Ativan and Keppra were ordered.  There was concern for alcohol withdrawal seizure given reported history of longstanding, daily alcohol use.  However, history is extremely limited.    Dr. Amada Jupiter with neurology was consulted who favors a focal etiology as opposed to alcohol withdrawal as the primary etiology.  He recommends initiating Keppra 500 mg twice daily, MRI brain, MRA head and neck, and EEG.  Neurology will follow the patient.  Consult of the hospitalist team and Dr. Julian Reil will accept the patient for admission for new onset seizures and further work-up and evaluation of chronic CVA.  The patient appears reasonably stabilized for admission considering the current resources, flow, and capabilities available in the ED at this time, and I doubt any other [facility] requiring further screening and/or treatment in the ED prior to admission.   Final Clinical Impression(s) / ED Diagnoses Final diagnoses:  Seizure (HCC)  Metabolic acidosis  Chronic cerebrovascular accident (CVA)  Alcohol use    Rx / DC Orders ED Discharge Orders    None       Barkley Boards, PA-C 01/20/20 0830    Zadie Rhine, MD 01/21/20 [phone]

## 2020-01-19 NOTE — ED Provider Notes (Signed)
Patient seen/examined in the Emergency Department in conjunction with Advanced Practice Provider McDonald Patient presents after seizure.  No previous h/o seizure Exam : awake/alert, appears confused and tremulous, but no distress on initial exam However approximately 5 minutes later I was called to room as patient had repeat seizure (generalized) that terminated spontaneously.  Ativan/keppra ordered and neuro consult Plan: will need admission for seizure.  May be ETOH withdrawal seizure    Zadie Rhine, MD 01/19/20 2344

## 2020-01-19 NOTE — ED Notes (Signed)
CBG 79 

## 2020-01-20 ENCOUNTER — Observation Stay (HOSPITAL_COMMUNITY): Payer: Medicare Other

## 2020-01-20 ENCOUNTER — Encounter (HOSPITAL_COMMUNITY): Payer: Self-pay | Admitting: Internal Medicine

## 2020-01-20 ENCOUNTER — Observation Stay (HOSPITAL_BASED_OUTPATIENT_CLINIC_OR_DEPARTMENT_OTHER): Payer: Medicare Other

## 2020-01-20 DIAGNOSIS — I351 Nonrheumatic aortic (valve) insufficiency: Secondary | ICD-10-CM

## 2020-01-20 DIAGNOSIS — G40909 Epilepsy, unspecified, not intractable, without status epilepticus: Secondary | ICD-10-CM

## 2020-01-20 DIAGNOSIS — I10 Essential (primary) hypertension: Secondary | ICD-10-CM | POA: Diagnosis present

## 2020-01-20 DIAGNOSIS — R569 Unspecified convulsions: Secondary | ICD-10-CM

## 2020-01-20 DIAGNOSIS — F1010 Alcohol abuse, uncomplicated: Secondary | ICD-10-CM | POA: Diagnosis not present

## 2020-01-20 DIAGNOSIS — Z8673 Personal history of transient ischemic attack (TIA), and cerebral infarction without residual deficits: Secondary | ICD-10-CM

## 2020-01-20 DIAGNOSIS — I6930 Unspecified sequelae of cerebral infarction: Secondary | ICD-10-CM

## 2020-01-20 DIAGNOSIS — I6389 Other cerebral infarction: Secondary | ICD-10-CM

## 2020-01-20 DIAGNOSIS — I639 Cerebral infarction, unspecified: Secondary | ICD-10-CM | POA: Diagnosis not present

## 2020-01-20 HISTORY — DX: Personal history of transient ischemic attack (TIA), and cerebral infarction without residual deficits: Z86.73

## 2020-01-20 LAB — URINALYSIS, COMPLETE (UACMP) WITH MICROSCOPIC
Bacteria, UA: NONE SEEN
Bilirubin Urine: NEGATIVE
Glucose, UA: NEGATIVE mg/dL
Ketones, ur: 5 mg/dL — AB
Leukocytes,Ua: NEGATIVE
Nitrite: NEGATIVE
Protein, ur: 100 mg/dL — AB
Specific Gravity, Urine: 1.012 (ref 1.005–1.030)
pH: 6 (ref 5.0–8.0)

## 2020-01-20 LAB — RESPIRATORY PANEL BY RT PCR (FLU A&B, COVID)
Influenza A by PCR: NEGATIVE
Influenza B by PCR: NEGATIVE
SARS Coronavirus 2 by RT PCR: NEGATIVE

## 2020-01-20 LAB — RAPID URINE DRUG SCREEN, HOSP PERFORMED
Amphetamines: NOT DETECTED
Barbiturates: NOT DETECTED
Benzodiazepines: NOT DETECTED
Cocaine: NOT DETECTED
Opiates: NOT DETECTED
Tetrahydrocannabinol: NOT DETECTED

## 2020-01-20 LAB — ECHOCARDIOGRAM COMPLETE
Area-P 1/2: 3.27 cm2
Calc EF: 36.5 %
Height: 71 in
S' Lateral: 4.57 cm
Single Plane A2C EF: 23.8 %
Single Plane A4C EF: 50.9 %
Weight: 2720 oz

## 2020-01-20 LAB — LIPID PANEL
Cholesterol: 219 mg/dL — ABNORMAL HIGH (ref 0–200)
HDL: 73 mg/dL (ref >40)
LDL Cholesterol: 129 mg/dL — ABNORMAL HIGH (ref 0–99)
Total CHOL/HDL Ratio: 3 RATIO
Triglycerides: 85 mg/dL (ref <150)
VLDL: 17 mg/dL (ref 0–40)

## 2020-01-20 LAB — PHOSPHORUS: Phosphorus: 2 mg/dL — ABNORMAL LOW (ref 2.5–4.6)

## 2020-01-20 LAB — TROPONIN I (HIGH SENSITIVITY): Troponin I (High Sensitivity): 23 ng/L — ABNORMAL HIGH (ref <18)

## 2020-01-20 LAB — MAGNESIUM: Magnesium: 1.8 mg/dL (ref 1.7–2.4)

## 2020-01-20 LAB — HIV ANTIBODY (ROUTINE TESTING W REFLEX): HIV Screen 4th Generation wRfx: NONREACTIVE

## 2020-01-20 IMAGING — MR MR HEAD W/O CM
6 series · 48 of 48 positions shown · non-contrast
Comparison: CT head without contrast [DATE]

CLINICAL DATA: Stroke, follow-up.  Seizure.

EXAM:
MRI HEAD WITHOUT CONTRAST
TECHNIQUE: Multiplanar, multiecho pulse sequences of the brain and surrounding
structures were obtained without intravenous contrast. The
examination had to be discontinued prior to completion due to
patient would not tolerate additional sequences.

[Series 5: DWI · axial · 3.0mm · 0.88mm/px · z∈[-80,+67]mm · 16 of 100 slices shown (1 of 4)]
[im 1/100]
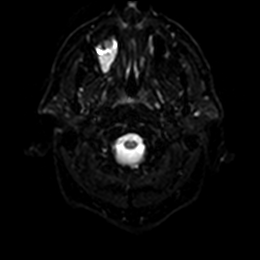
[im 7/100]
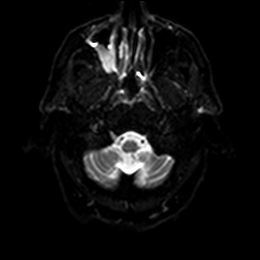
[im 14/100]
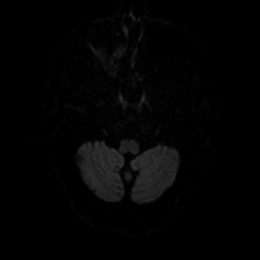
[im 20/100]
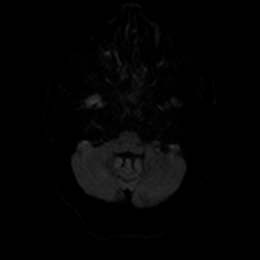
[im 27/100]
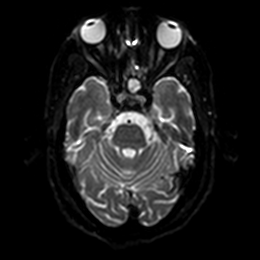
[im 34/100]
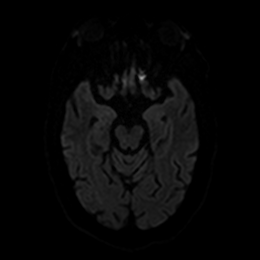
[im 40/100]
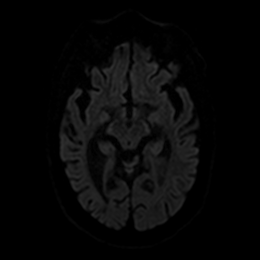
[im 47/100]
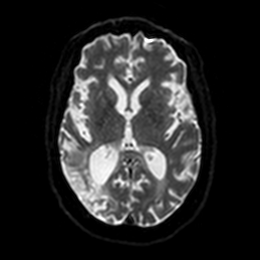
[im 53/100]
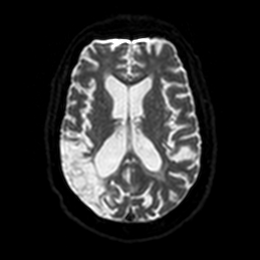
[im 60/100]
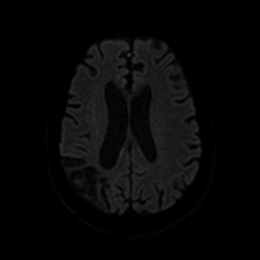
[im 67/100]
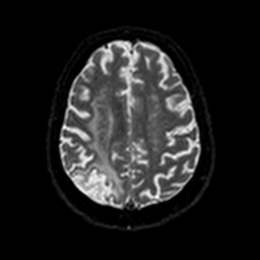
[im 73/100]
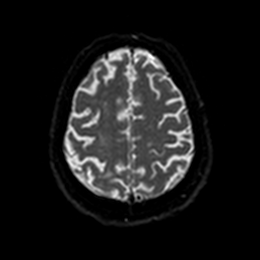
[im 80/100]
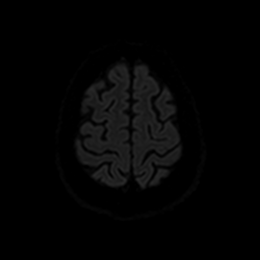
[im 86/100]
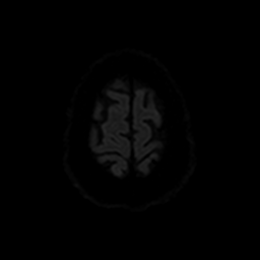
[im 93/100]
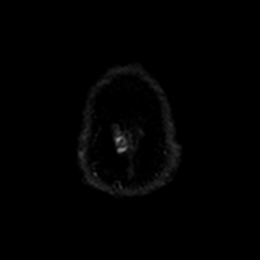
[im 100/100]
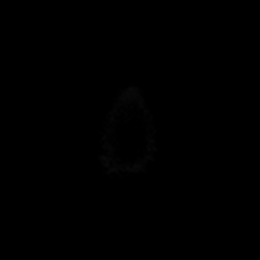

[Series 6: DWI · axial · 3.0mm · 0.88mm/px · z∈[-80,+67]mm · 8 of 50 slices shown (2 of 4)]
[im 1/50]
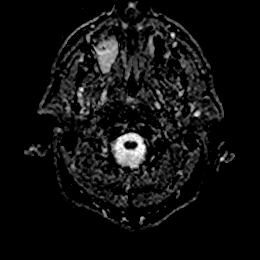
[im 8/50]
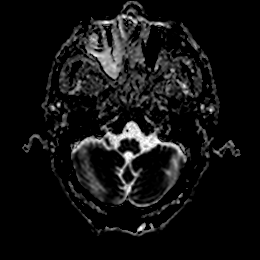
[im 15/50]
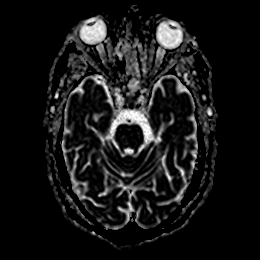
[im 22/50]
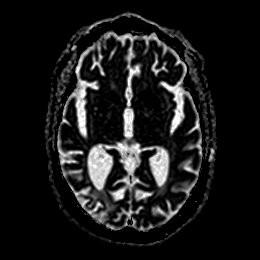
[im 29/50]
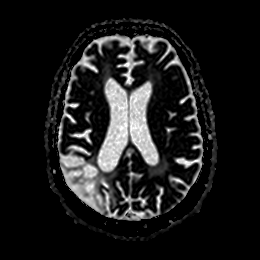
[im 36/50]
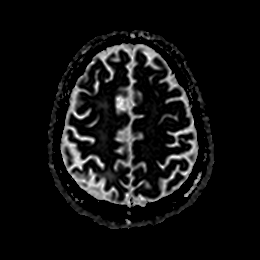
[im 43/50]
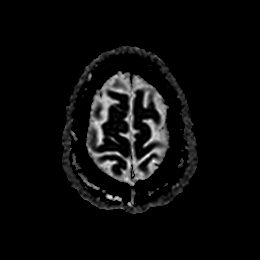
[im 50/50]
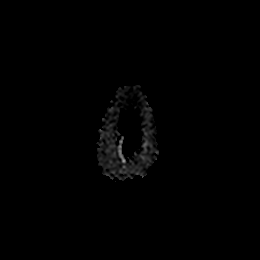

[Series 7: DWI · coronal · 4.0mm · 0.88mm/px · 11 of 68 slices shown (3 of 4)]
[im 1/68]
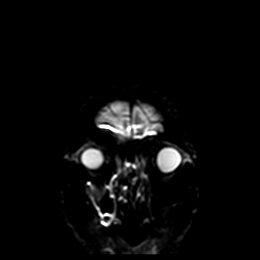
[im 7/68]
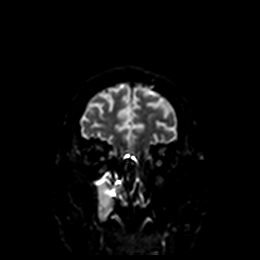
[im 14/68]
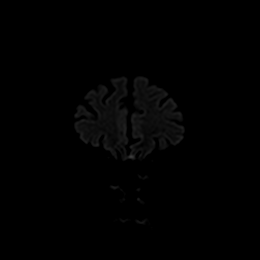
[im 21/68]
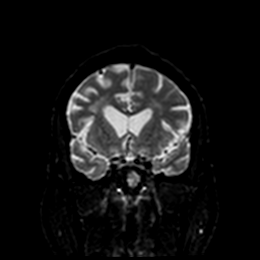
[im 27/68]
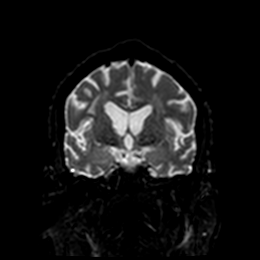
[im 34/68]
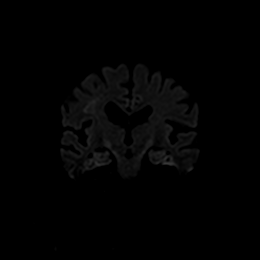
[im 41/68]
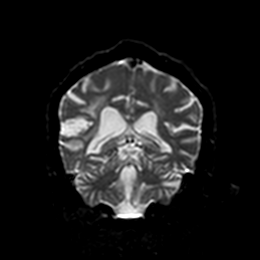
[im 47/68]
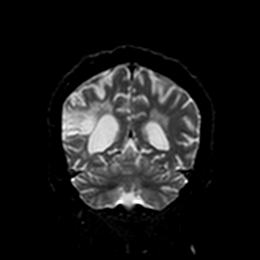
[im 54/68]
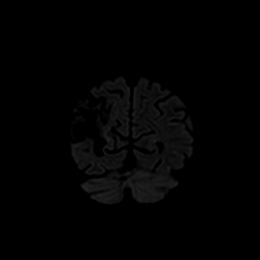
[im 61/68]
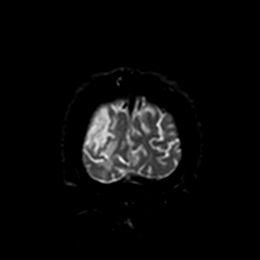
[im 68/68]
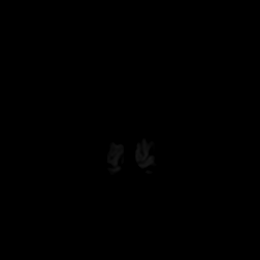

[Series 8: DWI · coronal · 4.0mm · 0.88mm/px · 5 of 34 slices shown (4 of 4)]
[im 1/34]
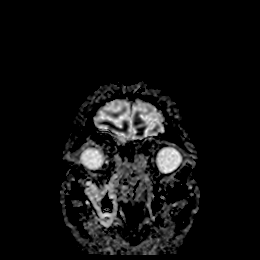
[im 9/34]
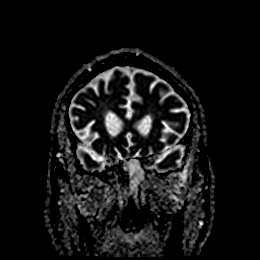
[im 17/34]
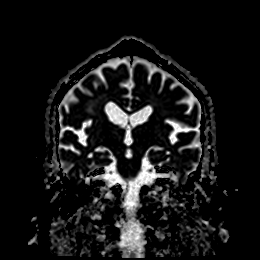
[im 25/34]
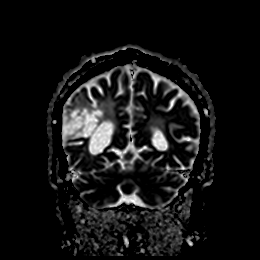
[im 34/34]
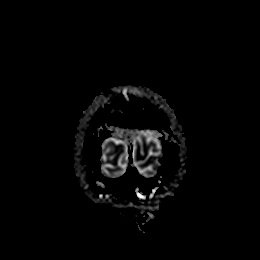

[Series 9: T2 · axial · 5.0mm · 0.72mm/px · z∈[-79,+65]mm · 4 of 25 slices shown]
[im 1/25]
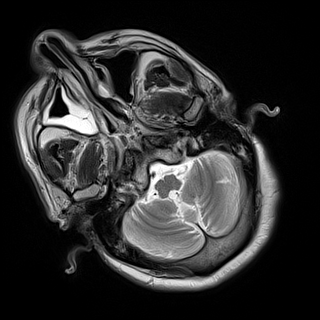
[im 9/25]
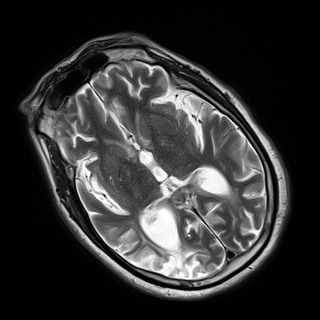
[im 17/25]
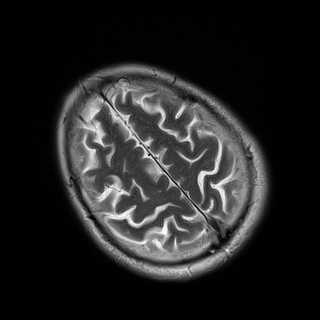
[im 25/25]
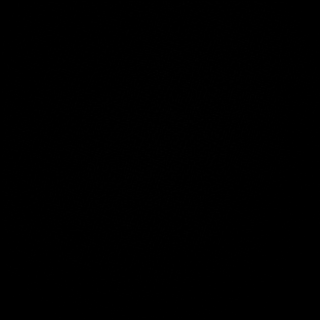

[Series 10: T1 · sagittal · 5.0mm · 0.75mm/px · 4 of 25 slices shown]
[im 1/25]
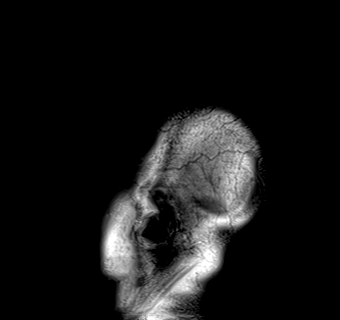
[im 9/25]
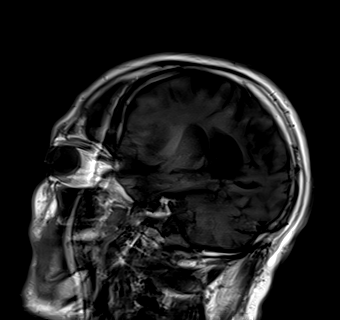
[im 17/25]
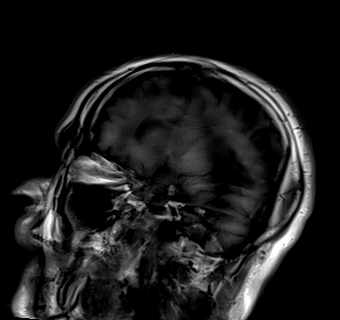
[im 25/25]
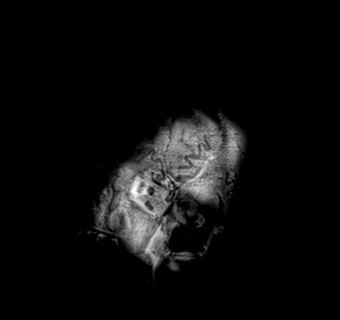

[48 of 48 positions shown; findings below may reference images not displayed]

FINDINGS: Brain: Remote posterior right MCA territory infarct is confirmed. Ex
vacuo dilation of the right lateral ventricle is present.
Periventricular white matter changes are moderately advanced for
age. Asymmetric changes are present in the right corona radiata.

No acute infarct, hemorrhage, or mass lesion is present. The
ventricles are proportionate to the degree of atrophy. No
significant extraaxial fluid collection is present.

Brainstem demonstrates wallerian changes along the right cortical
spinal tracts. Remote lacunar infarcts are present within the
cerebellum bilaterally.

Vascular: Flow is present in the major intracranial arteries.

Skull and upper cervical spine: The craniocervical junction is
normal. Upper cervical spine is within normal limits. Marrow signal
is unremarkable.

Sinuses/Orbits: Right maxillary bilateral posterior ethmoid and
sphenoid sinus disease is chronic. The globes and orbits are within
normal limits. Mastoid air cells are clear.
IMPRESSION: 1. No acute intracranial abnormality.
2. Remote posterior right MCA territory infarct.
3. Remote lacunar infarcts of the cerebellum bilaterally.
4. Atrophy and white matter disease is moderately advanced for age.
This likely reflects the sequela of chronic microvascular ischemia.
5. Chronic right maxillary bilateral posterior ethmoid and sphenoid
sinus disease.

## 2020-01-20 IMAGING — MR MR MRA HEAD W/O CM
2 series · 19 of 48 positions shown · non-contrast
Comparison: CT [DATE]

CLINICAL DATA: Stroke, follow-up; seizure

EXAM:
MRA HEAD WITHOUT CONTRAST
TECHNIQUE: Angiographic images of the Circle of Willis were obtained using MRA
technique without intravenous contrast.

[Series 3: ax (id) · axial · 1.0mm · 0.43mm/px · z∈[-74,+8]mm · 10 of 184 slices shown (1 of 2)]
[im 8/184]
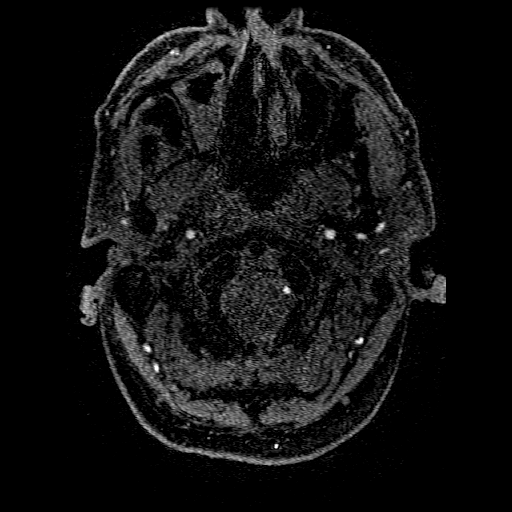
[im 32/184]
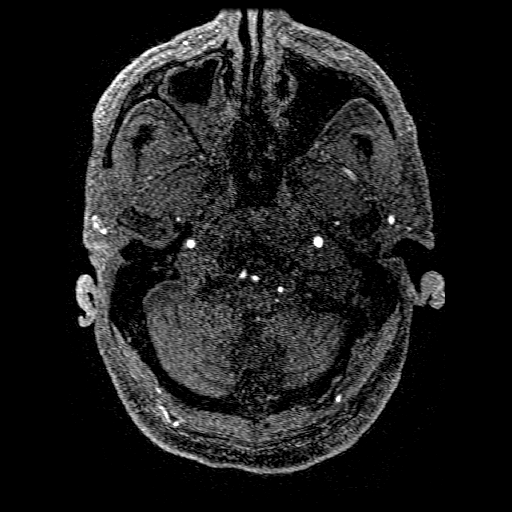
[im 56/184]
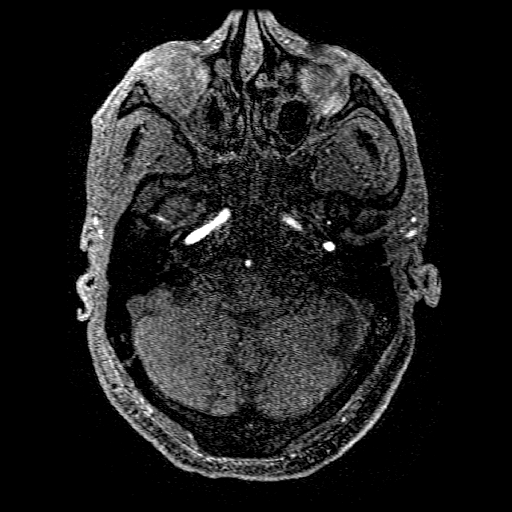
[im 80/184]
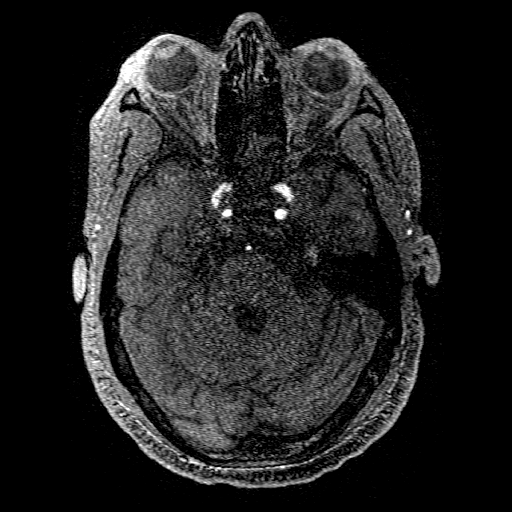
[im 96/184]
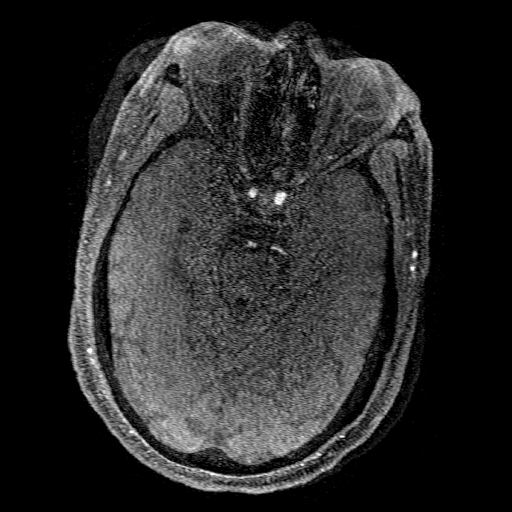
[im 104/184]
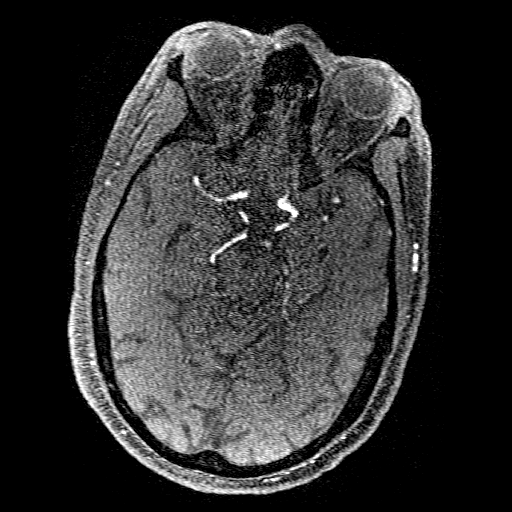
[im 128/184]
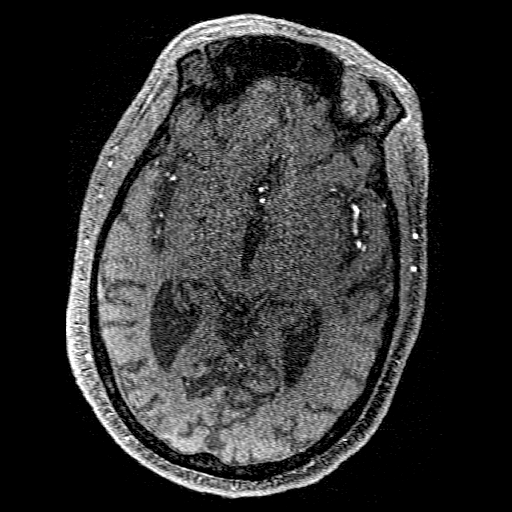
[im 152/184]
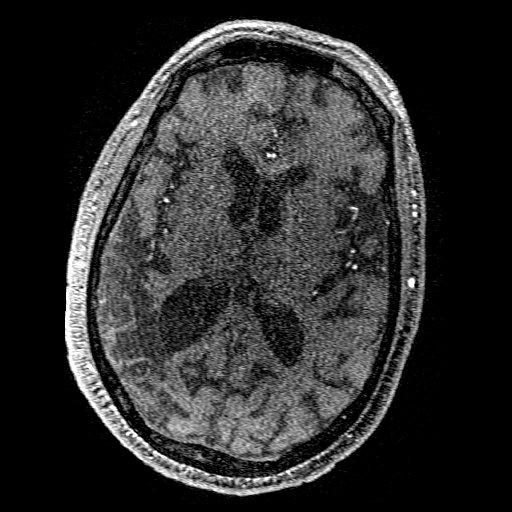
[im 160/184]
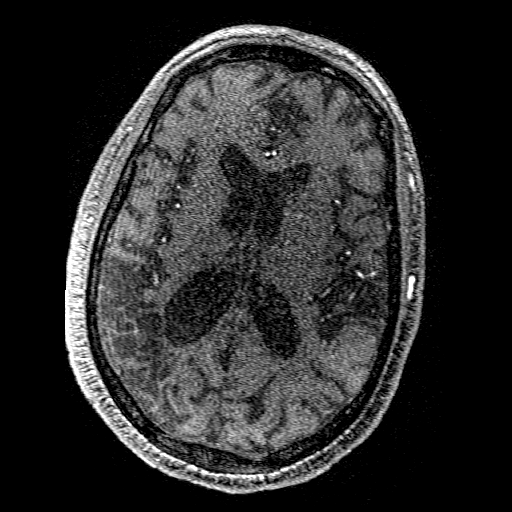
[im 176/184]
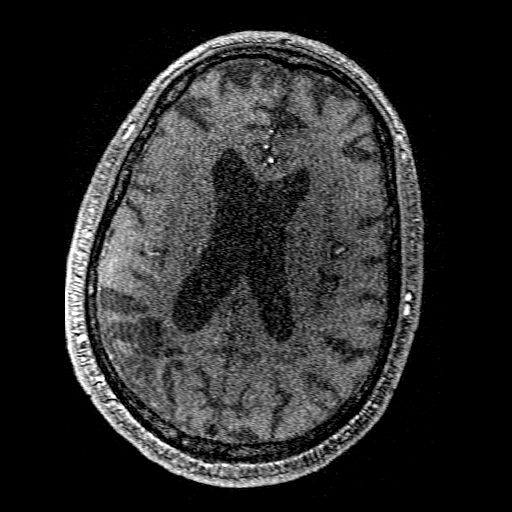

[Series 5: ax (id) · axial · 1.0mm · 0.43mm/px · z∈[-71,+4]mm · 9 of 184 slices shown (2 of 2)]
[im 8/184]
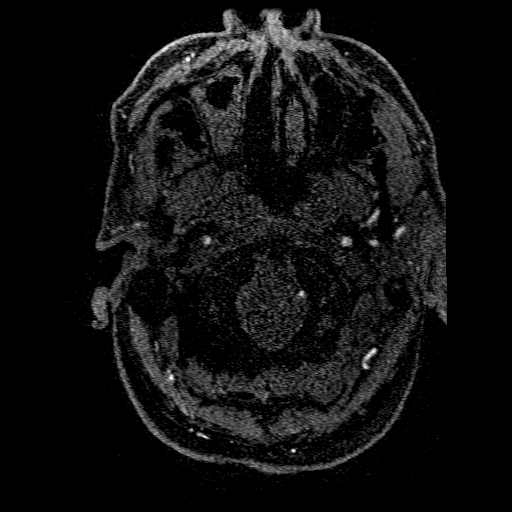
[im 32/184]
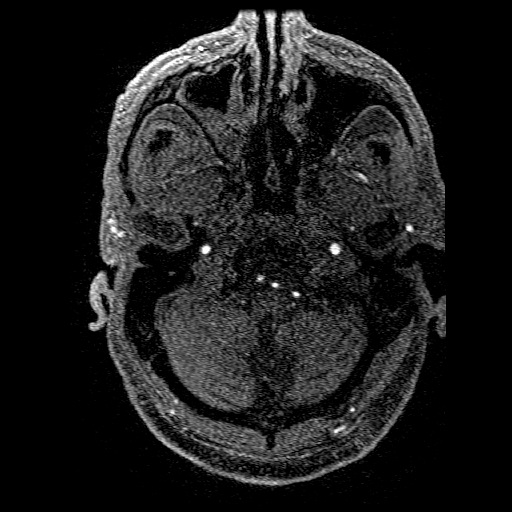
[im 56/184]
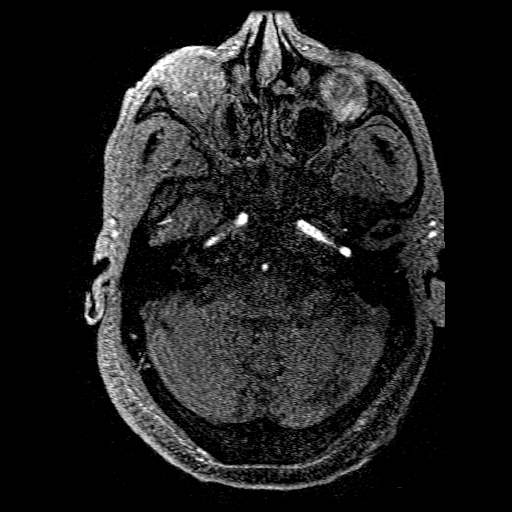
[im 80/184]
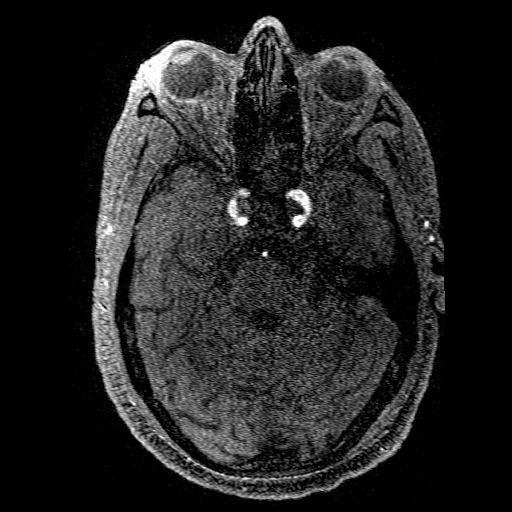
[im 96/184]
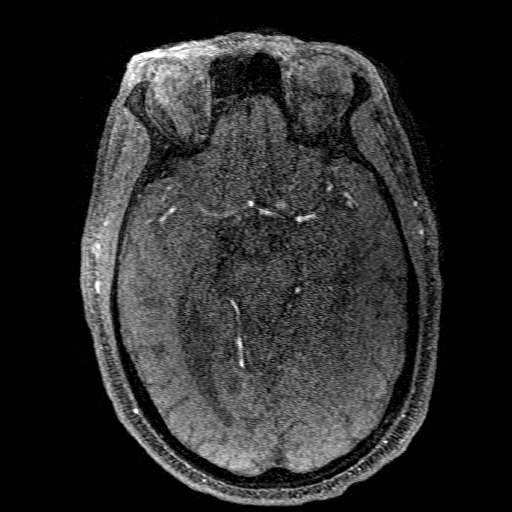
[im 104/184]
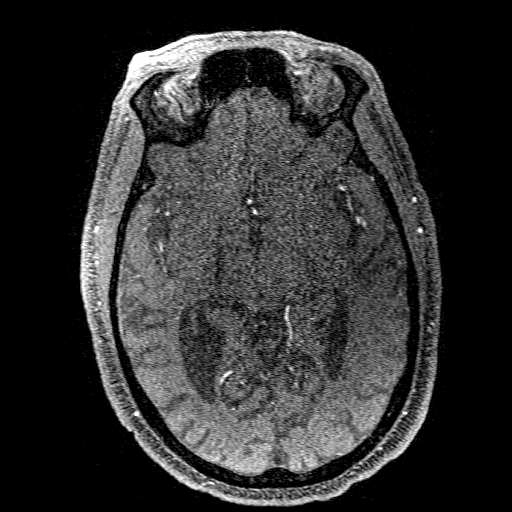
[im 128/184]
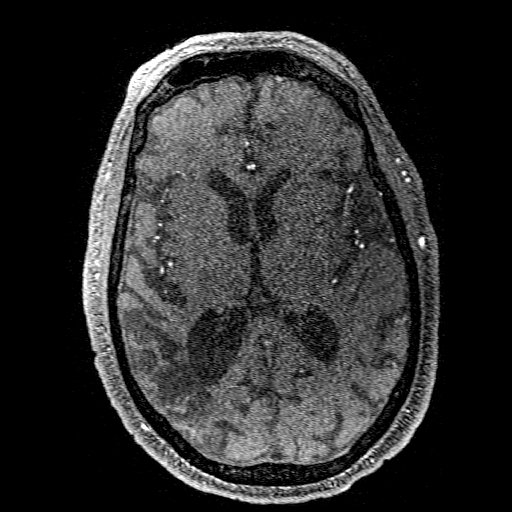
[im 152/184]
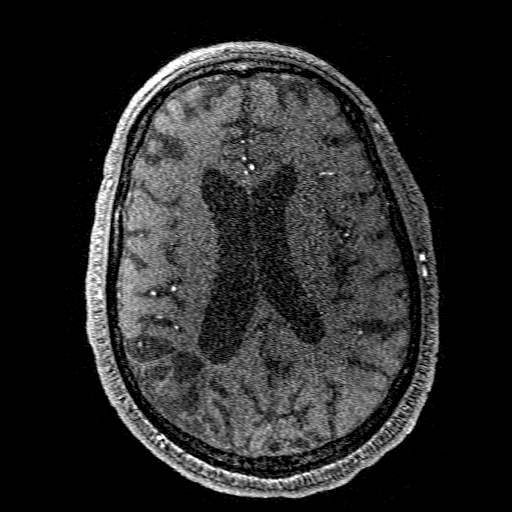
[im 160/184]
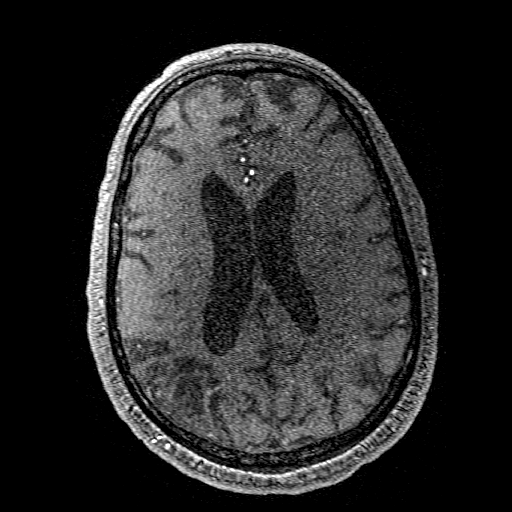

[19 of 48 positions shown; findings below may reference images not displayed]

FINDINGS: Intracranial internal carotid arteries are patent. Middle and
anterior cerebral arteries are patent. Congenitally absent right A1
ACA.

There is absent visualization of the proximal intracranial right
vertebral artery. Reconstitution at the level of the PICA origin
probably via retrograde flow. This was present on [69] CTA.
Intracranial left right vertebral artery, basilar artery, posterior
cerebral arteries are patent. Large bilateral posterior
communicating arteries are present with fetal or near fetal origin
of the posterior cerebral arteries.
IMPRESSION: No new proximal intracranial vessel occlusion.

As seen in [69], chronic right vertebral artery occlusion with
reconstitution at the PICA origin from retrograde flow.

## 2020-01-20 IMAGING — DX DG ABDOMEN 1V
2 series · 2 of 2 positions shown · non-contrast
Comparison: None.

CLINICAL DATA: Seizures

EXAM:
ABDOMEN - 1 VIEW

[abdomen kub (1 of 2)]
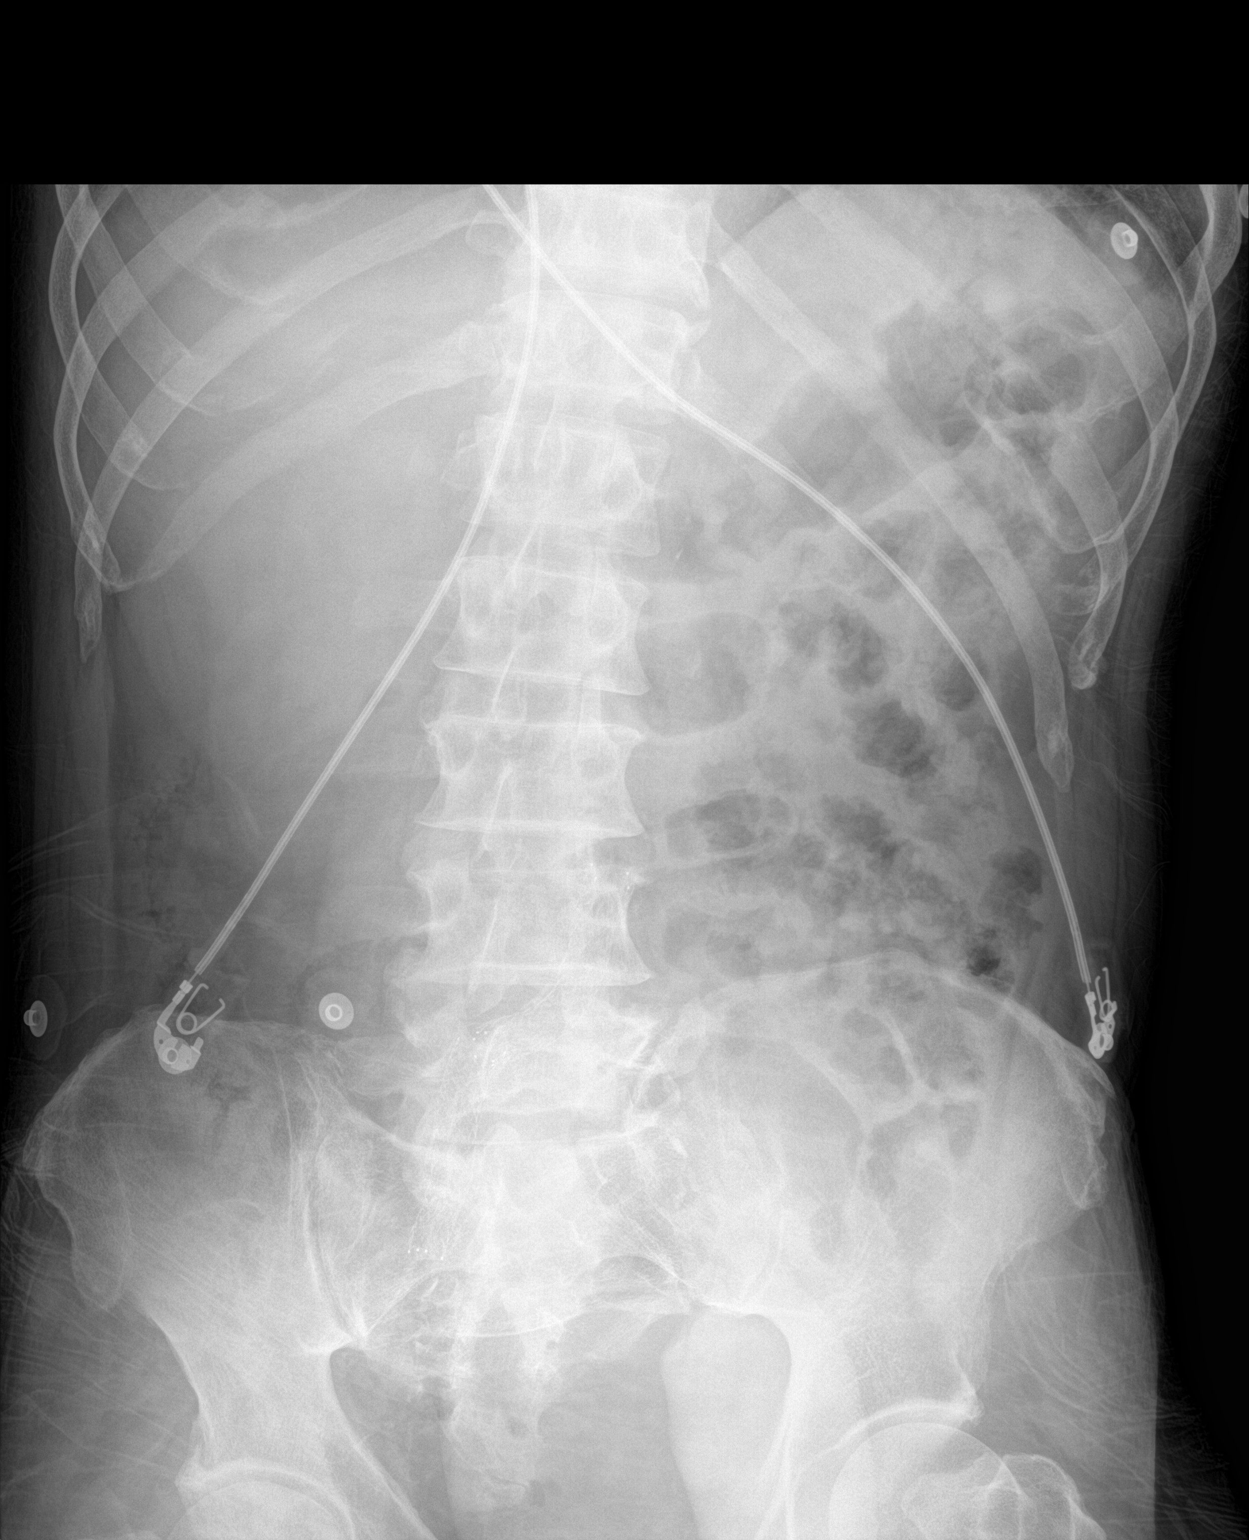

[abdomen kub (2 of 2)]
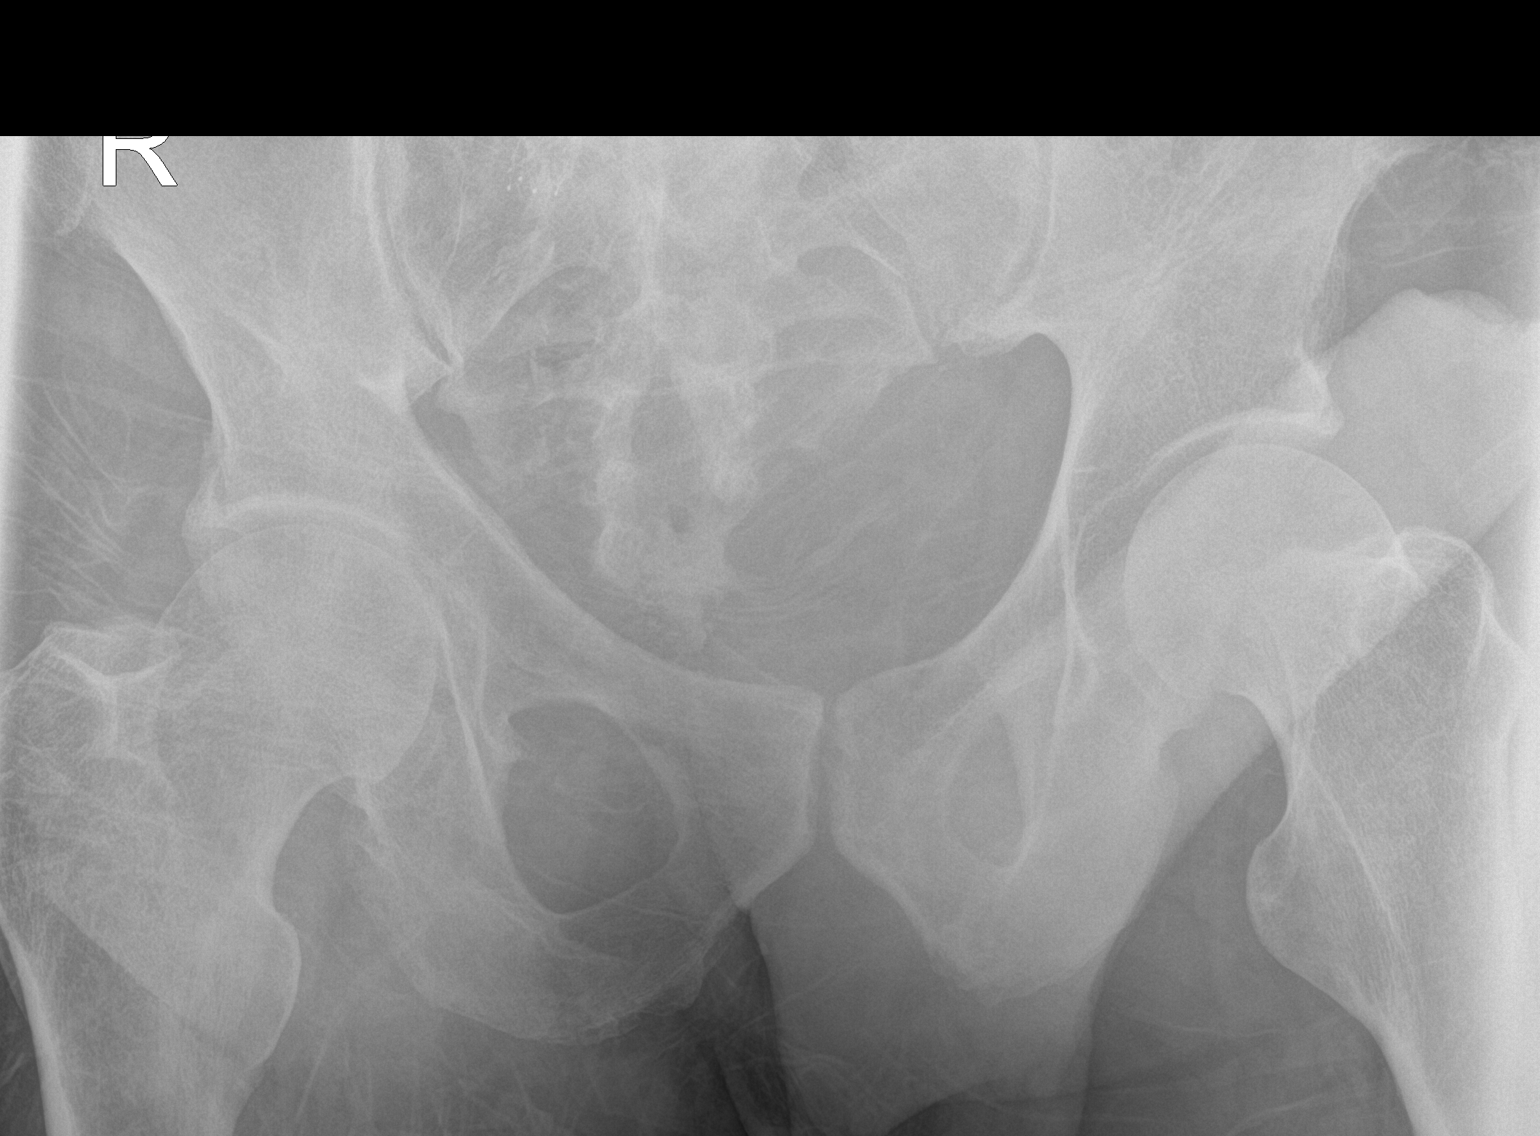

[2 of 2 positions shown; findings below may reference images not displayed]

FINDINGS: Normal abdominal gas pattern. No organomegaly. Vascular stents are
seen within the right common iliac artery and terminal aorta. No
unexpected metallic foreign body within the visualized abdomen.
IMPRESSION: No unexpected metallic foreign body within the visualized abdomen.

## 2020-01-20 MED ORDER — LEVETIRACETAM 500 MG PO TABS: 500.0000 mg | ORAL_TABLET | Freq: Two times a day (BID) | ORAL | Status: DC

## 2020-01-20 MED ORDER — LORAZEPAM 2 MG/ML IJ SOLN
1.0000 mg | INTRAMUSCULAR | Status: AC | PRN
  Administered 2020-01-20 – 2020-01-21 (×7): 2 mg via INTRAVENOUS
  Administered 2020-01-21: 1 mg via INTRAVENOUS
  Administered 2020-01-21 – 2020-01-22 (×4): 2 mg via INTRAVENOUS
  Administered 2020-01-22: 1 mg via INTRAVENOUS
  Administered 2020-01-22 (×2): 2 mg via INTRAVENOUS
  Administered 2020-01-23: 4 mg via INTRAVENOUS
  Filled 2020-01-20 (×4): qty 1
  Filled 2020-01-20: qty 2
  Filled 2020-01-20 (×7): qty 1
  Filled 2020-01-20: qty 2
  Filled 2020-01-20 (×2): qty 1

## 2020-01-20 MED ORDER — LORAZEPAM 1 MG PO TABS
1.0000 mg | ORAL_TABLET | ORAL | Status: AC | PRN
  Administered 2020-01-20 (×2): 2 mg via ORAL
  Filled 2020-01-20 (×2): qty 2

## 2020-01-20 MED ORDER — ADULT MULTIVITAMIN W/MINERALS CH
1.0000 | ORAL_TABLET | Freq: Every day | ORAL | Status: DC
  Administered 2020-01-20 – 2020-01-27 (×8): 1 via ORAL
  Filled 2020-01-20 (×8): qty 1

## 2020-01-20 MED ORDER — LEVETIRACETAM IN NACL 500 MG/100ML IV SOLN
500.0000 mg | Freq: Two times a day (BID) | INTRAVENOUS | Status: DC
  Administered 2020-01-20: 500 mg via INTRAVENOUS
  Filled 2020-01-20 (×2): qty 100

## 2020-01-20 MED ORDER — ACETAMINOPHEN 325 MG PO TABS
650.0000 mg | ORAL_TABLET | ORAL | Status: DC | PRN
  Filled 2020-01-20: qty 2

## 2020-01-20 MED ORDER — STROKE: EARLY STAGES OF RECOVERY BOOK: Freq: Once | Status: AC

## 2020-01-20 MED ORDER — ACETAMINOPHEN 650 MG RE SUPP: 650.0000 mg | RECTAL | Status: DC | PRN

## 2020-01-20 MED ORDER — ENOXAPARIN SODIUM 40 MG/0.4ML ~~LOC~~ SOLN
40.0000 mg | SUBCUTANEOUS | Status: DC
  Administered 2020-01-20 – 2020-01-27 (×8): 40 mg via SUBCUTANEOUS
  Filled 2020-01-20 (×8): qty 0.4

## 2020-01-20 MED ORDER — LEVETIRACETAM 500 MG PO TABS
500.0000 mg | ORAL_TABLET | Freq: Two times a day (BID) | ORAL | Status: DC
  Administered 2020-01-20 – 2020-01-27 (×15): 500 mg via ORAL
  Filled 2020-01-20 (×15): qty 1

## 2020-01-20 MED ORDER — FOLIC ACID 1 MG PO TABS
1.0000 mg | ORAL_TABLET | Freq: Every day | ORAL | Status: DC
  Administered 2020-01-20 – 2020-01-27 (×8): 1 mg via ORAL
  Filled 2020-01-20 (×8): qty 1

## 2020-01-20 MED ORDER — ASPIRIN 300 MG RE SUPP
300.0000 mg | Freq: Every day | RECTAL | Status: DC
  Filled 2020-01-20: qty 1

## 2020-01-20 MED ORDER — PERFLUTREN LIPID MICROSPHERE
1.0000 mL | INTRAVENOUS | Status: AC | PRN
  Administered 2020-01-20: 2 mL via INTRAVENOUS
  Filled 2020-01-20: qty 10

## 2020-01-20 MED ORDER — THIAMINE HCL 100 MG PO TABS
100.0000 mg | ORAL_TABLET | Freq: Every day | ORAL | Status: DC
  Administered 2020-01-20 – 2020-01-27 (×7): 100 mg via ORAL
  Filled 2020-01-20 (×7): qty 1

## 2020-01-20 MED ORDER — NICOTINE 21 MG/24HR TD PT24
21.0000 mg | MEDICATED_PATCH | Freq: Every day | TRANSDERMAL | Status: DC
  Administered 2020-01-20 – 2020-01-27 (×8): 21 mg via TRANSDERMAL
  Filled 2020-01-20 (×8): qty 1

## 2020-01-20 MED ORDER — ACETAMINOPHEN 160 MG/5ML PO SOLN: 650.0000 mg | ORAL | Status: DC | PRN

## 2020-01-20 MED ORDER — THIAMINE HCL 100 MG/ML IJ SOLN
100.0000 mg | Freq: Every day | INTRAMUSCULAR | Status: DC
  Administered 2020-01-22: 100 mg via INTRAVENOUS
  Filled 2020-01-20 (×6): qty 2

## 2020-01-20 MED ORDER — ATORVASTATIN CALCIUM 80 MG PO TABS
80.0000 mg | ORAL_TABLET | Freq: Every day | ORAL | Status: DC
  Administered 2020-01-20 – 2020-01-24 (×5): 80 mg via ORAL
  Filled 2020-01-20 (×5): qty 1

## 2020-01-20 MED ORDER — ASPIRIN 325 MG PO TABS
325.0000 mg | ORAL_TABLET | Freq: Every day | ORAL | Status: DC
  Administered 2020-01-20 – 2020-01-21 (×2): 325 mg via ORAL
  Filled 2020-01-20 (×2): qty 1

## 2020-01-20 NOTE — Consult Note (Signed)
Neurology Consultation Reason for Consult: Seizure Referring Physician: Laban Emperor  CC: Seizure  History is obtained from: Patient, ER staff  HPI: Dominic Carter is a 64 y.o. male with a history of alcohol abuse who presents with new onset seizure.  History is limited at this time due to the patient's sedated/postictal state and the one witness(son of roommate's significant other) was not available.  Apparently, he has had several episodes of behavioral arrest in the past.  He did not know that he had had a stroke.    He was witnessed to have sudden behavioral arrest with some shaking, though he did not fall at home.  He was brought into the emergency department where he was seen to have a witnessed seizure.  He was given Ativan and loaded with Keppra.  On my exam, he remains slightly confused, but he is able to answer some questions, such as tell me that his leg was amputated in 2012, but I am not certain his responses are all reliable.   ROS: Unable to obtain due to altered mental status.  Past Medical History:  Diagnosis Date  . DVT (deep venous thrombosis) (HCC)   . Dyslipidemia   . Hypertension      Family History  Problem Relation Age of Onset  . Hypertension Mother   . Hypertension Sister   . Hypertension Brother   . Hypertension Maternal Grandmother   . Hypertension Maternal Grandfather      Social History:  reports that he has been smoking cigarettes. He has been smoking about 1.00 pack per day. He has never used smokeless tobacco. He reports current alcohol use. He reports that he does not use drugs.   Exam: Current vital signs: BP (!) 149/84 (BP Location: Left Arm)   Pulse 92   Temp 98.4 F (36.9 C) (Oral)   Resp 18   Ht 5\' 11"  (1.803 m)   Wt 77.1 kg   SpO2 100%   BMI 23.71 kg/m  Vital signs in last 24 hours: Temp:  [98.4 F (36.9 C)] 98.4 F (36.9 C) (10/14 2157) Pulse Rate:  [92-104] 92 (10/15 0130) Resp:  [18-20] 18 (10/15 0130) BP:  (148-149)/(84-87) 149/84 (10/15 0130) SpO2:  [99 %-100 %] 100 % (10/15 0130) Weight:  [77.1 kg] 77.1 kg (10/14 2158)   Physical Exam  Constitutional: Appears well-developed and well-nourished.  Psych: Affect appropriate to situation Eyes: No scleral injection HENT: No OP obstrucion MSK: no joint deformities.  Cardiovascular: Normal rate and regular rhythm.  Respiratory: Effort normal, non-labored breathing GI: Soft.  No distension. There is no tenderness.  Skin: WDI  Neuro: Mental Status: Patient is somnolent but arousable, he is able to tell me his name and answer questions and follow commands, but refuses to answer many questions. Cranial Nerves: II: Visual Fields are full. Pupils are equal, round, and reactive to light.   III,IV, VI: EOMI without ptosis or diploplia.  V: Facial sensation is symmetric to temperature VII: Facial movement is symmetric.  VIII: hearing is intact to voice X: Uvula elevates symmetrically XI: Shoulder shrug is symmetric. XII: tongue is midline without atrophy or fasciculations.  Motor: Tone is normal. Bulk is normal.  He is able to hold both arms without drift, though he does not move his left arm quite as much as his right, he follows commands in his left lower extremities well with good strength. Sensory: He response to noxious stimulation in all four extremities Cerebellar: Does not perform  I have reviewed  labs in epic and the results pertinent to this consultation are:  UDS is negative Ethanol of 46 Creatinine of 0.87  calcium 8.7 Magnesium 1.8  I have reviewed the images obtained: CT head-fairly large temporal stroke on the right that is new since 2017  Impression: 64 year old male with a previous right temporal infarct (apparently unknown to patient) who presents with two seizures tonight.  There is a possibility that he has been drinking less since his roommate left, but this is unclear to me and in any case with description of at  least the initial event, I would favor focal etiology and therefore do not think alcohol withdrawal is the primary etiology.  He will need to be managed with antiepileptic therapy from this point forward.  Recommendations: 1) EEG 2) MRI brain, MRA head and neck 3) Keppra 500 mg twice daily 4) neurology will follow   Ritta Slot, MD [facility] [phone]  If 7pm- 7am, please page neurology on call as listed in AMION.

## 2020-01-20 NOTE — Progress Notes (Signed)
EEG Completed; Results Pending  

## 2020-01-20 NOTE — Progress Notes (Signed)
Briefly, this is a 64 year old man with history of HTN, HL, VTE/DVT, ongoing alcohol use who was admitted earlier today with what appears to be a new seizure.  Patient had another seizure in ED which was aborted by Ativan.  Work-up revealed a fairly large right temporal infarct which was presumably older, patient did not know he had had a stroke in the past.  Patient was seen by neurology who recommended initiating Keppra and ongoing follow-up with EEG, MRI and MRA.  Patient has done well overnight with no further seizure activity. This morning he was awake and alert, somewhat groggy but definitely knew why he was here.  He was again surprised to hear that he had had a stroke.  He notes he has been trying to cut down on his alcohol use and has only been drinking a couple of drinks a day for the last month or so.  Patient does admit to "getting the shakes" when he tries to quit alcohol completely.  Patient is presently undergoing neuro work-up as noted above.  MRI, MRA and EEG are pending.  Patient has been placed on Keppra 500 twice daily per their recommendations.  Patient is on CIWA protocol.  He is also on aspirin and Lipitor.

## 2020-01-20 NOTE — Evaluation (Signed)
Physical Therapy Evaluation Patient Details Name: Dominic Carter MRN: [id number] DOB: 1955-10-01 Today's Date: 01/20/2020   History of Present Illness  Patient is a 64 y/o male who presents with confusion and new onset seizures. 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  Clinical Impression  Patient presents with generalized weakness, impaired cognition and impaired mobility s/p above. Pt reports he lives at home with his landlord and is independent for ADLs and walks with RW PTA. Does not wear his prosthesis consistently. Today, pt noted to have deficits relating to attention, problem solving, following multi step commands and awareness. Requires Min A for squat pivot transfer to chair for safety/balance. Deferred standing/walking as pt wanting to eat lunch and call sister. Noted to have some difficulty using phone even with help. Reports his landlord can provide assist at home. Will follow acutely to maximize independence and mobility prior to return home. May need a w/c pending progress.    Follow Up Recommendations Home health PT;Supervision for mobility/OOB;Supervision/Assistance - 24 hour    Equipment Recommendations  Other (comment) (w/c pending progress)    Recommendations for Other Services       Precautions / Restrictions Precautions Precautions: Fall Precaution Comments: seizure precautions Restrictions Weight Bearing Restrictions: No      Mobility  Bed Mobility Overal bed mobility: Needs Assistance Bed Mobility: Supine to Sit     Supine to sit: Min guard;HOB elevated     General bed mobility comments: Use of rail to get to EOB.  Transfers Overall transfer level: Needs assistance Equipment used: None Transfers: Sit to/from Stand Sit to Stand: Min assist         General transfer comment: Pushing RW out of the way after placing urinal on there; Min A for squat pivot transfer towards left  side.  Ambulation/Gait             General Gait Details: Deferred as pt wanting to eat lunch  Stairs            Wheelchair Mobility    Modified Rankin (Stroke Patients Only) Modified Rankin (Stroke Patients Only) Pre-Morbid Rankin Score: Slight disability Modified Rankin: Moderately severe disability     Balance Overall balance assessment: Needs assistance Sitting-balance support: Feet supported;No upper extremity supported Sitting balance-Leahy Scale: Good     Standing balance support: During functional activity Standing balance-Leahy Scale: Poor Standing balance comment: Did not stand fully upright but needed UE support to get to chair in partial standing.                             Pertinent Vitals/Pain Pain Assessment: No/denies pain    Home Living Family/patient expects to be discharged to:: Private residence Living Arrangements: Non-relatives/Friends (landlord) Available Help at Discharge: Available PRN/intermittently;Other (Comment) Type of Home: House Home Access: Stairs to enter Entrance Stairs-Rails: Right Entrance Stairs-Number of Steps: 4 in front, 3 in back Home Layout: One level Home Equipment: Walker - 2 wheels;Cane - single point      Prior Function Level of Independence: Independent with assistive device(s)         Comments: Uses RW for ambulation; does not wear prosthesis too much. Does ADLs, does some cooking. Does not drive. no falls last 6 months.     Hand Dominance        Extremity/Trunk Assessment   Upper Extremity Assessment Upper Extremity Assessment: Defer to OT evaluation  Lower Extremity Assessment Lower Extremity Assessment: Generalized weakness (but functional)       Communication   Communication: Expressive difficulties  Cognition Arousal/Alertness: Awake/alert Behavior During Therapy: WFL for tasks assessed/performed Overall Cognitive Status: Impaired/Different from baseline Area of  Impairment: Orientation;Attention;Memory;Following commands;Safety/judgement;Problem solving;Awareness                 Orientation Level: Disoriented to;Time;Situation Current Attention Level: Selective   Following Commands: Follows one step commands with increased time;Follows multi-step commands inconsistently Safety/Judgement: Decreased awareness of safety;Decreased awareness of deficits Awareness: Intellectual Problem Solving: Slow processing;Requires verbal cues General Comments: "I am fine." Has difficulty using phone despite cues, wanting to call sister. Easily distracted and fidgeting during session. Needs repetition of cues to perform task.      General Comments General comments (skin integrity, edema, etc.): VSS on RA.    Exercises     Assessment/Plan    PT Assessment Patient needs continued PT services  PT Problem List Decreased mobility;Decreased safety awareness;Decreased cognition;Decreased balance       PT Treatment Interventions Therapeutic activities;Gait training;Therapeutic exercise;Patient/family education;Balance training;Functional mobility training;Cognitive remediation;Stair training    PT Goals (Current goals can be found in the Care Plan section)  Acute Rehab PT Goals Patient Stated Goal: to calll my sister PT Goal Formulation: With patient Time For Goal Achievement: 02/03/20 Potential to Achieve Goals: Fair    Frequency Min 3X/week   Barriers to discharge Decreased caregiver support      Co-evaluation               AM-PAC PT "6 Clicks" Mobility  Outcome Measure Help needed turning from your back to your side while in a flat bed without using bedrails?: None Help needed moving from lying on your back to sitting on the side of a flat bed without using bedrails?: A Little Help needed moving to and from a bed to a chair (including a wheelchair)?: A Little Help needed standing up from a chair using your arms (e.g., wheelchair or  bedside chair)?: A Little Help needed to walk in hospital room?: A Little Help needed climbing 3-5 steps with a railing? : A Lot 6 Click Score: 18    End of Session Equipment Utilized During Treatment: Gait belt Activity Tolerance: Patient tolerated treatment well Patient left: in chair;with call bell/phone within reach;with chair alarm set Nurse Communication: Mobility status PT Visit Diagnosis: Difficulty in walking, not elsewhere classified (R26.2);Unsteadiness on feet (R26.81)    Time: 0981-1914 PT Time Calculation (min) (ACUTE ONLY): 22 min   Charges:   PT Evaluation $PT Eval Moderate Complexity: 1 Mod          Vale Haven, PT, DPT Acute Rehabilitation Services Pager [phone] Office [phone]      Blake Divine A Lanier Ensign 01/20/2020, 12:57 PM

## 2020-01-20 NOTE — Evaluation (Signed)
Speech Language Pathology Evaluation Patient Details Name: Dominic Carter MRN: [id number] DOB: Sep 03, 1955 Today's Date: 01/20/2020 Time: 1350-1404 SLP Time Calculation (min) (ACUTE ONLY): 14 min  Problem List:  Patient Active Problem List   Diagnosis Date Noted  . Seizure (HCC) 01/20/2020  . ETOH abuse 01/20/2020  . HTN (hypertension) 01/20/2020  . Chronic ischemic right MCA stroke 01/20/2020   Past Medical History:  Past Medical History:  Diagnosis Date  . DVT (deep venous thrombosis) (HCC)   . Dyslipidemia   . Hypertension    Past Surgical History:  Past Surgical History:  Procedure Laterality Date  . BELOW KNEE LEG AMPUTATION Right    HPI:  Patient is a 64 y/o male who presents with confusion and new onset seizures. 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   Assessment / Plan / Recommendation Clinical Impression   Pt presents with moderately severe cognitive deficits.  Pt scored 12/30 on the [place] Mental Status Exam with deficits most notable for clock drawing, delayed recall, and story recall.  Functionally, pt also presents with decreased safety awareness with impulsivity and poor recall of daily information.  Pt repeatedly tried to get out of bed to go to the bathroom until therapist showed him that he was wearing a condom catheter and that he needed to call for assistance before getting out of bed.  Pt was eventually redirected and left resting quietly in bed at the time of therapist's departure.  Given the deficits mentioned above, pt would benefit from skilled ST while inpatient in order to maximize functional independence and reduce burden of care prior to discharge.  Anticipate that pt will need 24/7 supervision at discharge in addition to ST follow up at next level of care.      SLP Assessment  SLP Recommendation/Assessment: Patient needs continued Speech Lanaguage Pathology Services SLP Visit  Diagnosis: Cognitive communication deficit (R41.841)    Follow Up Recommendations  24 hour supervision/assistance;Home health SLP    Frequency and Duration min 2x/week         SLP Evaluation Cognition  Overall Cognitive Status: Impaired/Different from baseline Arousal/Alertness: Awake/alert Orientation Level: Oriented to person;Oriented to place;Oriented to situation;Disoriented to time Attention: Sustained Sustained Attention: Impaired Sustained Attention Impairment: Functional basic Memory: Impaired Memory Impairment: Retrieval deficit;Storage deficit Awareness: Impaired Awareness Impairment: Intellectual impairment Problem Solving: Impaired Problem Solving Impairment: Functional basic Behaviors: Restless Safety/Judgment: Impaired       Comprehension  Auditory Comprehension Overall Auditory Comprehension: Appears within functional limits for tasks assessed    Expression Expression Primary Mode of Expression: Verbal Verbal Expression Overall Verbal Expression: Appears within functional limits for tasks assessed   Oral / Motor  Oral Motor/Sensory Function Overall Oral Motor/Sensory Function: Within functional limits Motor Speech Overall Motor Speech: Impaired Articulation: Impaired Level of Impairment: Sentence Intelligibility: Intelligibility reduced Sentence: 50-74% accurate   GO                    Dominic Carter 01/20/2020, 2:15 PM

## 2020-01-20 NOTE — Plan of Care (Signed)
   Problem: Coping: Goal: Ability to adjust to condition or change in health will improve Outcome: Not Progressing Goal: Ability to identify appropriate support needs will improve Outcome: Not Progressing

## 2020-01-20 NOTE — ED Notes (Signed)
Patient transported to MRI 

## 2020-01-20 NOTE — Progress Notes (Signed)
  Echocardiogram 2D Echocardiogram with definity has been performed.  Dominic Carter M 01/20/2020, 8:50 AM

## 2020-01-20 NOTE — Progress Notes (Signed)
Subjective: No seizures overnight.   ROS: negative except above  Examination  Vital signs in last 24 hours: Temp:  [98.1 F (36.7 C)-98.7 F (37.1 C)] 98.1 F (36.7 C) (10/15 0747) Pulse Rate:  [80-104] 80 (10/15 0747) Resp:  [18-22] 18 (10/15 0747) BP: (148-155)/(80-93) 151/80 (10/15 0747) SpO2:  [95 %-100 %] 95 % (10/15 0747) Weight:  [77.1 kg] 77.1 kg (10/14 2158)  General: lying in bed, not in apparent distress CVS: pulse-normal rate and rhythm RS: breathing comfortably, CTAB Extremities: normal, warm Neuro: Awake, alert, oriented to place and person, not to time, able to name objects, follows all commands, cranial nerves II to XII grossly intact, 5/5 in all 4 extremities.  Basic Metabolic Panel: Recent Labs  Lab 01/19/20 2216 01/20/20 0057  NA 137  --   K 3.7  --   CL 100  --   CO2 16*  --   GLUCOSE 79  --   BUN 5*  --   CREATININE 0.87  --   CALCIUM 8.7*  --   MG  --  1.8  PHOS  --  2.0*    CBC: Recent Labs  Lab 01/19/20 2216  WBC 6.8  NEUTROABS 4.2  HGB 13.8  HCT 41.8  MCV 96.3  PLT 124*     Coagulation Studies: No results for input(s): LABPROT, INR in the last 72 hours.  Imaging MRI brain without contrast 01/20/2020: No acute intracranial abnormality. Remote posterior right MCA territory infarct. Remote lacunar infarcts of the cerebellum bilaterally. Atrophy and white matter disease is moderately advanced for age. This likely reflects the sequela of chronic microvascular ischemia. 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   ASSESSMENT AND PLAN: 64 year old male with history of alcohol abuse who presented with new onset seizure.  Chronic right temporal infarct Hyperlipidemia Hypophosphatemia Hyperammonemia Elevated alcohol level Thrombocytopenia Hypocalcemia Transaminitis -Patient's alcohol level was elevated on arrival (46).  However, the semiology of the episode was focal in onset and patient has a  right temporal stroke on MRI.  Therefore patient has a high likelihood of recurrence of seizures and needs to be on antiepileptic drugs. - A1c: Pending, Cholesterol 219, LDL 129 - TTE: No intracardiac source of embolism. -MRA head and neck: No new proximal intracranial vessel occlusion.  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 -Carotid ultrasound pending  Recommendations -Continue Keppra 500 mg twice daily -Continue aspirin 325 mg daily and atorvastatin 80 mg nightly for secondary stroke prevention -Discusse how alcohol lowers seizure threshold, counseled regarding alcohol cessation. -Discussed other seizure lowering factors including sleep deprivation. Discussed seizure precautions including do not drive for 6 months -Recommend follow-up with neurology in 12 to 16 weeks  Thank you for allowing [place] to precipitate the care of this patient.  Neurology will follow.  Please call us if you have any further questions.    Lindie Spruce Epilepsy [facility] For questions after 5pm please refer to AMION to reach the Neurologist on call

## 2020-01-20 NOTE — Procedures (Addendum)
Patient Name: Dominic Carter  MRN: [id number]  Epilepsy Attending: Charlsie Quest  Referring Physician/Provider: Ritta Slot Date: 01/20/2020 Duration: 26.32 mins  Patient history:  65 year old male with a previous right temporal infarct (apparently unknown to patient) who presents with two seizures. EEG to evaluate for seizure.   Level of alertness: Awake, asleep  AEDs during EEG study: LEV  Technical aspects: This EEG study was done with scalp electrodes positioned according to the 10-20 International system of electrode placement. Electrical activity was acquired at a sampling rate of 500Hz  and reviewed with a high frequency filter of 70Hz  and a low frequency filter of 1Hz . EEG data were recorded continuously and digitally stored.   Description: The posterior dominant rhythm consists of 9 Hz activity of moderate voltage (25-35 uV) seen predominantly in posterior head regions, symmetric and reactive to eye opening and eye closing. Sleep was characterized by vertex waves, sleep spindles (12 to 14 Hz), maximal frontocentral region.  EEG showed continuous 3 to 6 Hz theta-delta slowing in right temporal region.  Hyperventilation and photic stimulation were not performed.     ABNORMALITY -Continuous slow, right temporal region  IMPRESSION: This study is suggestive of cortical dysfunction in right temporal region consistent with underlying stroke.  No seizures or epileptiform discharges were seen throughout the recording.  Meris Reede 

## 2020-01-20 NOTE — ED Notes (Signed)
Cleaned pt, put new sheets on bed and new gown on pt

## 2020-01-20 NOTE — H&P (Signed)
History and Physical    OHN BOSTIC FIE:[id number] DOB: Jul 10, 1955 DOA: 01/19/2020  PCP: Patient, No Pcp Per  Patient coming from: Home  I have personally briefly reviewed patient's old medical records in [facility]  Chief Complaint: Seizures  HPI: Dominic Carter is a 64 y.o. 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  Pt presents to the ED after seizure at home.  Per roommates significant other: Pt reportedly standing in doorway at home this evening, trembling all over, and unable to speak.  EMS called.  Pt AAOx2 with EMS and post ictal on arrival to ED.  Pt apparently drinks EtOH daily.  Patient unable to contribute much to history currently due to post-ictal state.   ED Course: While in the ED:  CT head reveals a chronic appearing posterior R MCA stroke.  This is new since 2017 (when he last had a CT head on file).  Doesn't look like his PCP is aware of any stroke history based on April 2021 office note.  Pt also denies having had a stroke previously that he knows of.  Pt tremulous in ED, ultimately ends up having another tonic-clonic seizure around [place], this is terminated with Ativan given by EDP.  Pt now very post-ictal.   Review of Systems:  Pt reports no prior history of seizures, no PMH of stroke.  History taking is difficult due to patients current post ictal state. Past Medical History:  Diagnosis Date   DVT (deep venous thrombosis) (HCC)    Dyslipidemia    Hypertension     Past Surgical History:  Procedure Laterality Date   BELOW KNEE LEG AMPUTATION Right      reports that he has been smoking cigarettes. He has been smoking about 1.00 pack per day. He has never used smokeless tobacco. He reports current alcohol use. He reports that he does not use drugs.  No Known Allergies  Family History  Problem Relation Age of Onset   Hypertension Mother    Hypertension Sister    Hypertension  Brother    Hypertension Maternal Grandmother    Hypertension Maternal Grandfather      Prior to Admission medications   Medication Sig Start Date End Date Taking? Authorizing Provider  cyclobenzaprine (FLEXERIL) 10 MG tablet Take 1 tablet (10 mg total) by mouth 2 (two) times daily as needed for muscle spasms. 03/02/16   Isa Rankin, MD  methocarbamol (ROBAXIN) 500 MG tablet Take 1 tablet (500 mg total) by mouth at bedtime as needed. 05/04/17   Mathews Robinsons B, PA-C  naproxen (NAPROSYN) 500 MG tablet Take 1 tablet (500 mg total) by mouth 2 (two) times daily with a meal. 05/04/17   Mathews Robinsons B, PA-C  traMADol (ULTRAM) 50 MG tablet Take 1 tablet (50 mg total) by mouth every 6 (six) hours as needed. 03/02/16   Isa Rankin, MD    Physical Exam: Vitals:   01/19/20 2157 01/19/20 2158  BP: (!) 148/87   Pulse: (!) 104   Resp: 20   Temp: 98.4 F (36.9 C)   TempSrc: Oral   SpO2: 99%   Weight:  77.1 kg  Height:  5\' 11"  (1.803 m)    Constitutional: Sleepy and post ictal Eyes: PERRL, lids and conjunctivae normal ENMT: Mucous membranes are moist. Posterior pharynx clear of any exudate or lesions.Normal dentition.  Neck: normal, supple, no masses, no thyromegaly Respiratory: clear to auscultation bilaterally,  no wheezing, no crackles. Normal respiratory effort. No accessory muscle use.  Cardiovascular: Regular rate and rhythm, no murmurs / rubs / gallops. No extremity edema. 2+ pedal pulses. No carotid bruits.  Abdomen: no tenderness, no masses palpated. No hepatosplenomegaly. Bowel sounds positive.  Musculoskeletal: RLE s/p BKA Skin: no rashes, lesions, ulcers. No induration Neurologic: Sleepy and post ictal, LUE maybe slightly weaker than RUE. Psychiatric: Sleepy, post-ictal   Labs on Admission: I have personally reviewed following labs and imaging studies  CBC: Recent Labs  Lab 01/19/20 2216  WBC 6.8  NEUTROABS 4.2  HGB 13.8  HCT 41.8  MCV 96.3  PLT 124*   Basic  Metabolic Panel: Recent Labs  Lab 01/19/20 2216  NA 137  K 3.7  CL 100  CO2 16*  GLUCOSE 79  BUN 5*  CREATININE 0.87  CALCIUM 8.7*   GFR: Estimated Creatinine Clearance: 91.4 mL/min (by C-G formula based on SCr of 0.87 mg/dL). Liver Function Tests: Recent Labs  Lab 01/19/20 2216  AST 82*  ALT 41  ALKPHOS 99  BILITOT 1.0  PROT 7.4  ALBUMIN 3.8   No results for input(s): LIPASE, AMYLASE in the last 168 hours. Recent Labs  Lab 01/19/20 2217  AMMONIA 67*   Coagulation Profile: No results for input(s): INR, PROTIME in the last 168 hours. Cardiac Enzymes: No results for input(s): CKTOTAL, CKMB, CKMBINDEX, TROPONINI in the last 168 hours. BNP (last 3 results) No results for input(s): PROBNP in the last 8760 hours. HbA1C: No results for input(s): HGBA1C in the last 72 hours. CBG: Recent Labs  Lab 01/19/20 2231  GLUCAP 79   Lipid Profile: No results for input(s): CHOL, HDL, LDLCALC, TRIG, CHOLHDL, LDLDIRECT in the last 72 hours. Thyroid Function Tests: No results for input(s): TSH, T4TOTAL, FREET4, T3FREE, THYROIDAB in the last 72 hours. Anemia Panel: No results for input(s): VITAMINB12, FOLATE, FERRITIN, TIBC, IRON, RETICCTPCT in the last 72 hours. Urine analysis:    Component Value Date/Time   COLORURINE YELLOW 01/19/2020 2351   APPEARANCEUR CLEAR 01/19/2020 2351   LABSPEC 1.012 01/19/2020 2351   PHURINE 6.0 01/19/2020 2351   GLUCOSEU NEGATIVE 01/19/2020 2351   HGBUR SMALL (A) 01/19/2020 2351   BILIRUBINUR NEGATIVE 01/19/2020 2351   KETONESUR 5 (A) 01/19/2020 2351   PROTEINUR 100 (A) 01/19/2020 2351   NITRITE NEGATIVE 01/19/2020 2351   LEUKOCYTESUR NEGATIVE 01/19/2020 2351    Radiological Exams on Admission: CT Head Wo Contrast  Result Date: 01/19/2020 CLINICAL DATA:  64 year old male status post unwitnessed seizure. EXAM: CT HEAD WITHOUT CONTRAST TECHNIQUE: Contiguous axial images were obtained from the base of the skull through the vertex without  intravenous contrast. COMPARISON:  Head CT, CTA head and neck 03/02/2016 FINDINGS: Brain: Moderate sized chronic infarct with encephalomalacia in the posterior right MCA territory, new since 2017. Mild ex vacuo ventricular enlargement. Progressed bilateral cerebral white matter hypodensity also since 2017. But otherwise stable gray-white matter differentiation. No midline shift, ventriculomegaly, mass effect, evidence of mass lesion, intracranial hemorrhage or evidence of cortically based acute infarction. Gray-white matter differentiation is within normal limits throughout the brain. Vascular: Calcified atherosclerosis at the skull base. No suspicious intracranial vascular hyperdensity. Skull: Stable, intact. Sinuses/Orbits: Progressed chronic sinus disease since 2017, most affecting the sphenoid and right maxillary sinuses. Tympanic cavities and mastoids remain clear. Other: No acute orbit or scalp soft tissue injury. IMPRESSION: 1. No acute intracranial abnormality or acute traumatic injury identified. 2. Chronic posterior right MCA territory infarct, new since 2017. 3. Progressed chronic paranasal  sinus disease. Electronically Signed   By: Odessa Fleming M.D.   On: 01/19/2020 23:13   DG Chest Portable 1 View  Result Date: 01/19/2020 CLINICAL DATA:  Fall, altered mental status, seizure EXAM: PORTABLE CHEST 1 VIEW COMPARISON:  None. FINDINGS: The heart size and mediastinal contours are within normal limits. Both lungs are clear. The visualized skeletal structures are unremarkable. IMPRESSION: No active disease. Electronically Signed   By: Helyn Numbers MD   On: 01/19/2020 23:10    EKG: Independently reviewed.  Assessment/Plan Principal Problem:   Seizure (HCC) Active Problems:   ETOH abuse   HTN (hypertension)   Chronic ischemic right MCA stroke    1. Seizures- currently post-ictal 1. Think that EtOH withdrawal on top of encephalomalacia from the previously undiagnosed prior stroke is to  blame. 2. Neurology consulted 3. Keppra 1g load in ED and 500mg  IV BID ordered for now 4. Needs MRI brain 5. Defer need for EEG to neuro 6. Ativan PRN as per CIWA 7. Tele monitor 8. Seizure precautions 2. Chronic ischemic R MCA stroke - 1. Suspect he may not have much in the way of deficits at baseline since he didn't even know he had a stroke, but the L sided weakness we are seeing right now is post-ictal / recrudescence of old symptoms due to being post-ictal. 2. Neuro seeing patient 3. Stroke pathway and work up 4. Tele monitor 5. PT/OT/SLP 6. ASA 325 for now 7. MRI brain, MRA head 8. Carotid dopplers 9. 2d echo 10. A1C, FLP 3. EtOH abuse - 1. CIWA 4. HTN - 1. Med rec pending 2. Cont home BP meds   DVT prophylaxis: Lovenox Code Status: Full Family Communication: No family in room Disposition Plan: Home after seizure and stroke work ups and mental status improved Consults called: Dr. Admission status: Place in obs   Khayden Herzberg M. DO [facility]  How to contact the [facility] Attending or Consulting provider 7A - 7P or covering provider during after hours 7P -7A, for this patient?  1. Check the care team in [facility] and look for a) attending/consulting [facility] provider listed and b) the [facility] team listed 2. Log into [email or web address]  Amion Physician Scheduling and messaging for groups and whole hospitals  On call and physician scheduling software for group practices, residents, hospitalists and other medical providers for call, clinic, rotation and shift schedules. On[employer] is a hospital-wide system for scheduling doctors and paging doctors on call. EasyPlot is for scientific plotting and data analysis.  [email or web address]  and use Laura's universal password to access. If you do not have the password, please contact the hospital operator.  3. Locate the [facility] provider you are looking for under [facility] and page to a number that you can be directly  reached. 4. If you still have difficulty reaching the provider, please page the [facility] (Director on Call) for the Hospitalists listed on amion for assistance.  01/20/2020, 1:36 AM

## 2020-01-20 NOTE — Progress Notes (Signed)
Carotid artery duplex has been completed. Preliminary results can be found in CV Proc through chart review.   01/20/20 1:50 PM Olen Cordial RVT

## 2020-01-21 ENCOUNTER — Inpatient Hospital Stay (HOSPITAL_COMMUNITY): Payer: Medicare Other

## 2020-01-21 DIAGNOSIS — R569 Unspecified convulsions: Secondary | ICD-10-CM | POA: Diagnosis present

## 2020-01-21 DIAGNOSIS — D696 Thrombocytopenia, unspecified: Secondary | ICD-10-CM | POA: Diagnosis present

## 2020-01-21 DIAGNOSIS — Z86718 Personal history of other venous thrombosis and embolism: Secondary | ICD-10-CM | POA: Diagnosis not present

## 2020-01-21 DIAGNOSIS — Y902 Blood alcohol level of 40-59 mg/100 ml: Secondary | ICD-10-CM | POA: Diagnosis present

## 2020-01-21 DIAGNOSIS — Z781 Physical restraint status: Secondary | ICD-10-CM | POA: Diagnosis not present

## 2020-01-21 DIAGNOSIS — G40909 Epilepsy, unspecified, not intractable, without status epilepticus: Secondary | ICD-10-CM | POA: Diagnosis present

## 2020-01-21 DIAGNOSIS — E512 Wernicke's encephalopathy: Secondary | ICD-10-CM | POA: Diagnosis present

## 2020-01-21 DIAGNOSIS — F17210 Nicotine dependence, cigarettes, uncomplicated: Secondary | ICD-10-CM | POA: Diagnosis present

## 2020-01-21 DIAGNOSIS — Z20822 Contact with and (suspected) exposure to covid-19: Secondary | ICD-10-CM | POA: Diagnosis present

## 2020-01-21 DIAGNOSIS — K7010 Alcoholic hepatitis without ascites: Secondary | ICD-10-CM | POA: Diagnosis present

## 2020-01-21 DIAGNOSIS — I10 Essential (primary) hypertension: Secondary | ICD-10-CM | POA: Diagnosis not present

## 2020-01-21 DIAGNOSIS — G9389 Other specified disorders of brain: Secondary | ICD-10-CM | POA: Diagnosis present

## 2020-01-21 DIAGNOSIS — F10139 Alcohol abuse with withdrawal, unspecified: Secondary | ICD-10-CM | POA: Diagnosis present

## 2020-01-21 DIAGNOSIS — E785 Hyperlipidemia, unspecified: Secondary | ICD-10-CM | POA: Diagnosis present

## 2020-01-21 DIAGNOSIS — Z7982 Long term (current) use of aspirin: Secondary | ICD-10-CM | POA: Diagnosis not present

## 2020-01-21 DIAGNOSIS — E872 Acidosis: Secondary | ICD-10-CM | POA: Diagnosis present

## 2020-01-21 DIAGNOSIS — Z7289 Other problems related to lifestyle: Secondary | ICD-10-CM | POA: Diagnosis not present

## 2020-01-21 DIAGNOSIS — Z89511 Acquired absence of right leg below knee: Secondary | ICD-10-CM | POA: Diagnosis not present

## 2020-01-21 DIAGNOSIS — Z8249 Family history of ischemic heart disease and other diseases of the circulatory system: Secondary | ICD-10-CM | POA: Diagnosis not present

## 2020-01-21 DIAGNOSIS — F1010 Alcohol abuse, uncomplicated: Secondary | ICD-10-CM | POA: Diagnosis not present

## 2020-01-21 DIAGNOSIS — Z79899 Other long term (current) drug therapy: Secondary | ICD-10-CM | POA: Diagnosis not present

## 2020-01-21 DIAGNOSIS — K760 Fatty (change of) liver, not elsewhere classified: Secondary | ICD-10-CM | POA: Diagnosis present

## 2020-01-21 DIAGNOSIS — I69354 Hemiplegia and hemiparesis following cerebral infarction affecting left non-dominant side: Secondary | ICD-10-CM | POA: Diagnosis not present

## 2020-01-21 DIAGNOSIS — I4581 Long QT syndrome: Secondary | ICD-10-CM | POA: Diagnosis present

## 2020-01-21 DIAGNOSIS — E7220 Disorder of urea cycle metabolism, unspecified: Secondary | ICD-10-CM | POA: Diagnosis present

## 2020-01-21 LAB — HEPATITIS PANEL, ACUTE
HCV Ab: NONREACTIVE
Hep A IgM: NONREACTIVE
Hep B C IgM: NONREACTIVE
Hepatitis B Surface Ag: NONREACTIVE

## 2020-01-21 LAB — BASIC METABOLIC PANEL
Anion gap: 14 (ref 5–15)
BUN: 6 mg/dL — ABNORMAL LOW (ref 8–23)
CO2: 22 mmol/L (ref 22–32)
Calcium: 8.7 mg/dL — ABNORMAL LOW (ref 8.9–10.3)
Chloride: 100 mmol/L (ref 98–111)
Creatinine, Ser: 0.85 mg/dL (ref 0.61–1.24)
GFR, Estimated: >60 mL/min (ref >60)
Glucose, Bld: 73 mg/dL (ref 70–99)
Potassium: 3.5 mmol/L (ref 3.5–5.1)
Sodium: 136 mmol/L (ref 135–145)

## 2020-01-21 LAB — CBC
HCT: 43.5 % (ref 39.0–52.0)
Hemoglobin: 14.4 g/dL (ref 13.0–17.0)
MCH: 30.6 pg (ref 26.0–34.0)
MCHC: 33.1 g/dL (ref 30.0–36.0)
MCV: 92.4 fL (ref 80.0–100.0)
Platelets: 109 10*3/uL — ABNORMAL LOW (ref 150–400)
RBC: 4.71 MIL/uL (ref 4.22–5.81)
RDW: 12.9 % (ref 11.5–15.5)
WBC: 4 10*3/uL (ref 4.0–10.5)
nRBC: 0 % (ref 0.0–0.2)

## 2020-01-21 LAB — AMMONIA: Ammonia: 38 umol/L — ABNORMAL HIGH (ref 9–35)

## 2020-01-21 LAB — HEMOGLOBIN A1C
Hgb A1c MFr Bld: 5.4 % (ref 4.8–5.6)
Mean Plasma Glucose: 108 mg/dL

## 2020-01-21 IMAGING — US US ABDOMEN LIMITED
1 series · 14 of 25 positions shown · non-contrast
Comparison: None.

CLINICAL DATA: Cirrhosis

EXAM:
ULTRASOUND ABDOMEN LIMITED RIGHT UPPER QUADRANT

[Series 1001: us abdomen limited ruq (liver/gb) · 14 of 44 slices shown]
[im 1/44]
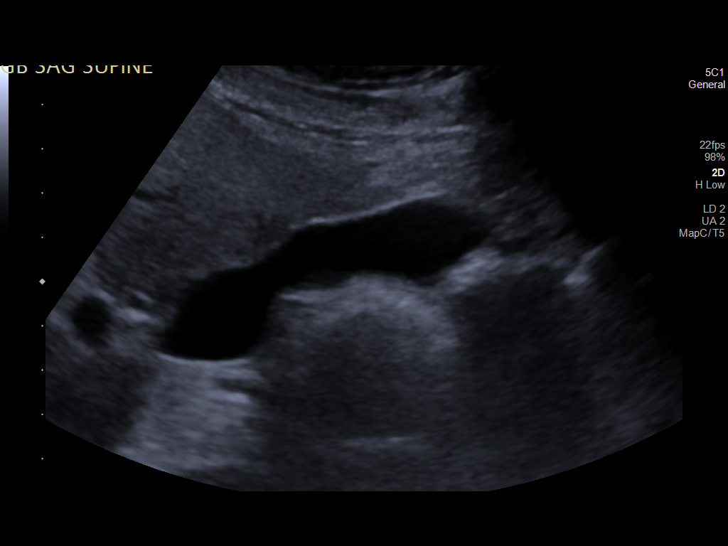
[im 4/44]
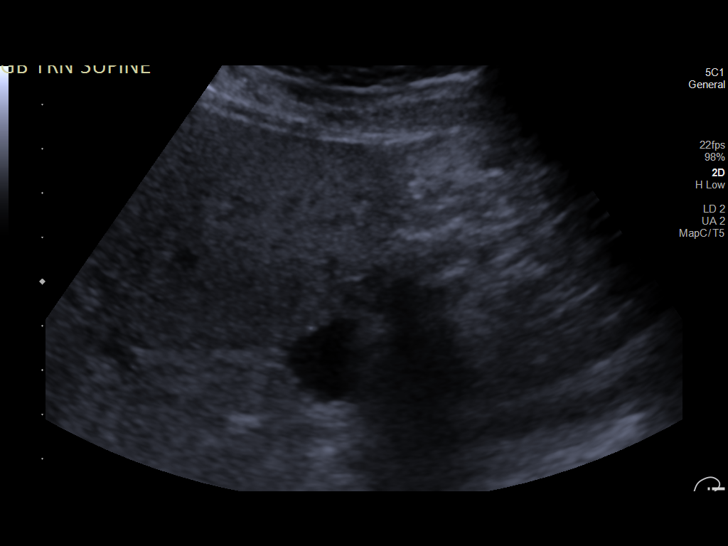
[im 8/44]
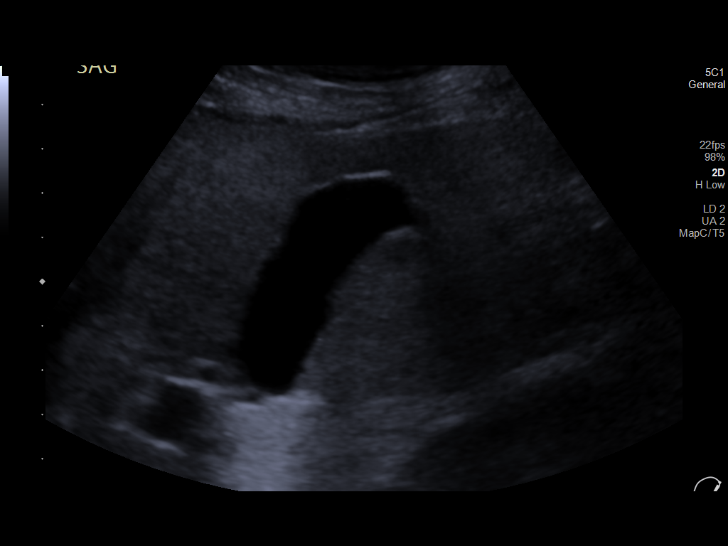
[im 11/44]
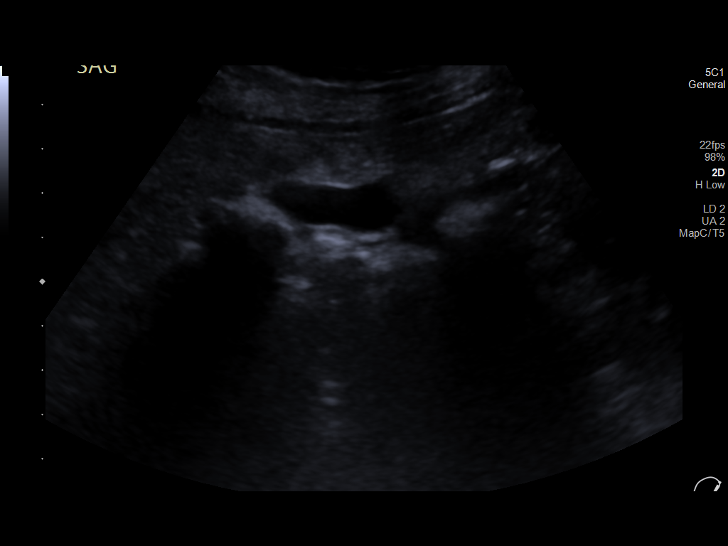
[im 15/44]
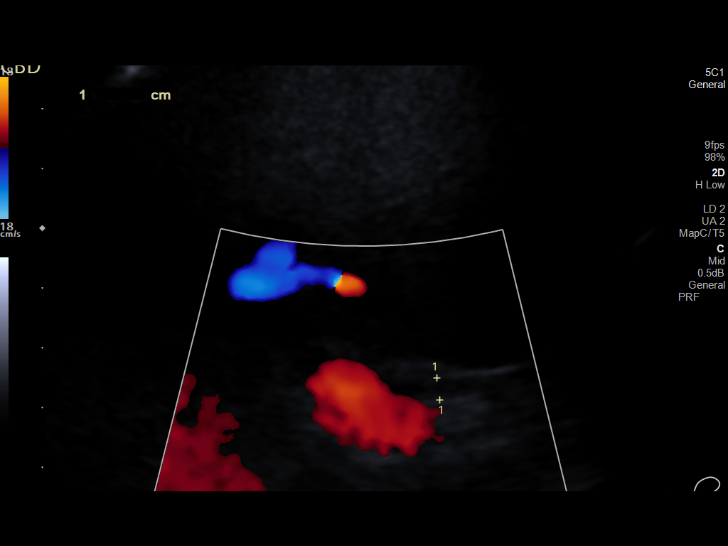
[im 17/44]
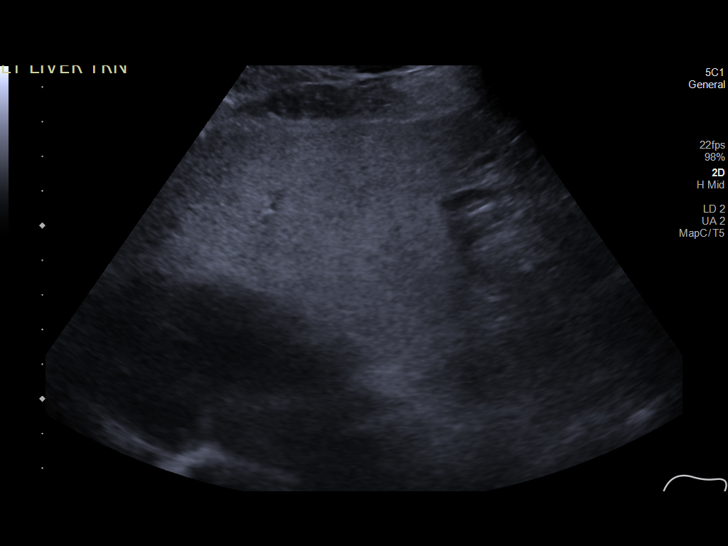
[im 20/44]
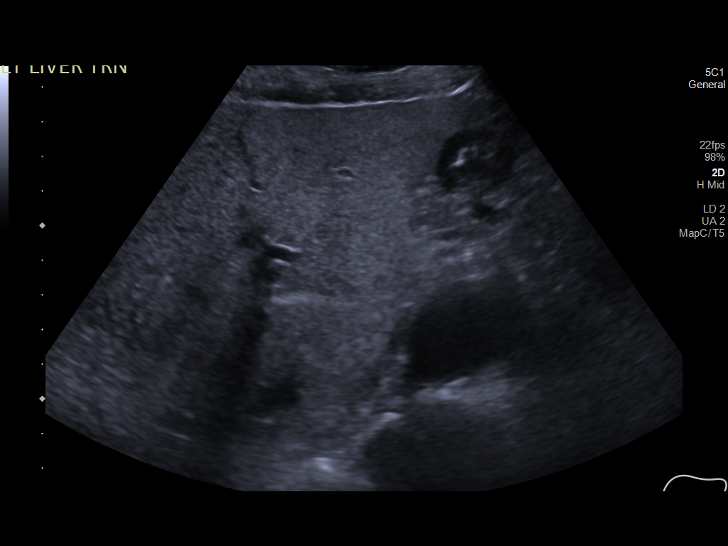
[im 24/44]
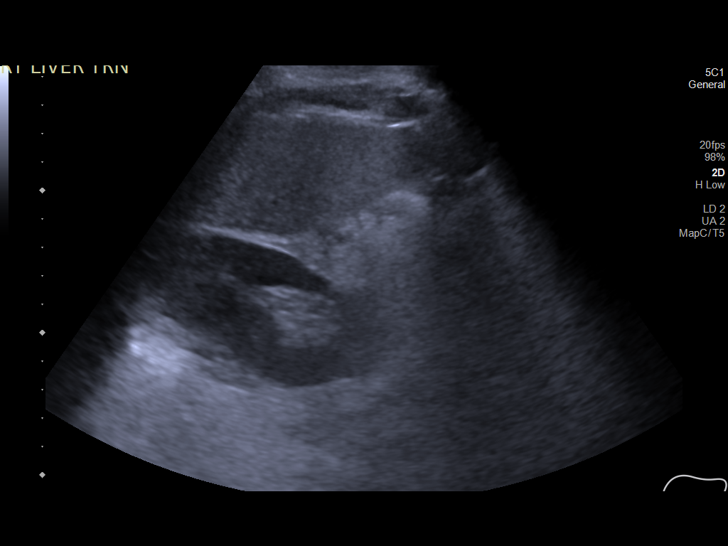
[im 27/44]
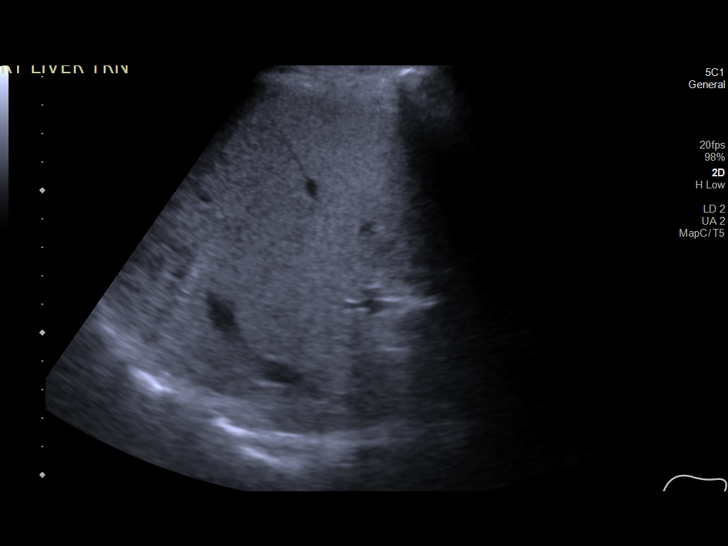
[im 29/44]
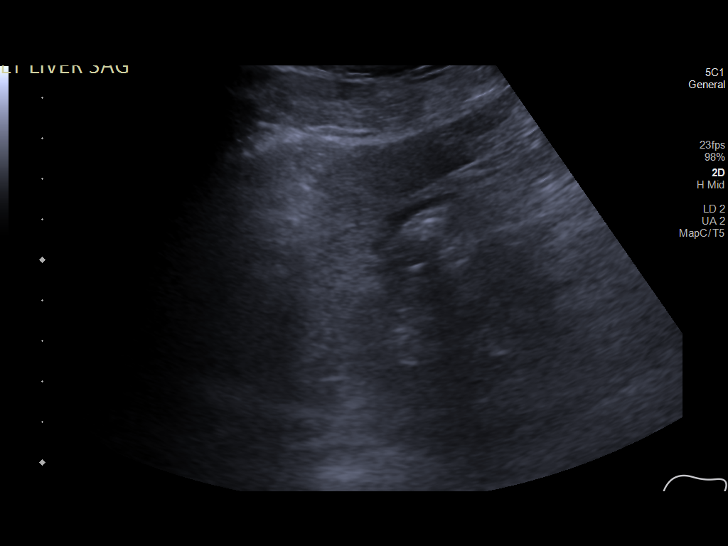
[im 33/44]
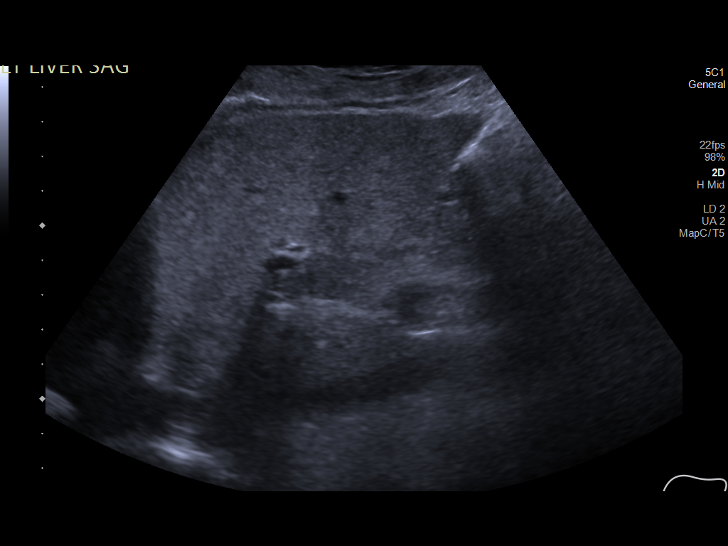
[im 36/44]
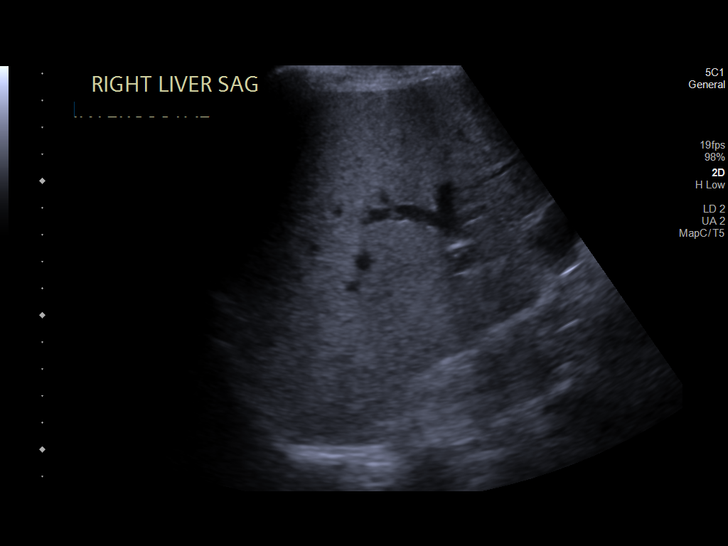
[im 40/44]
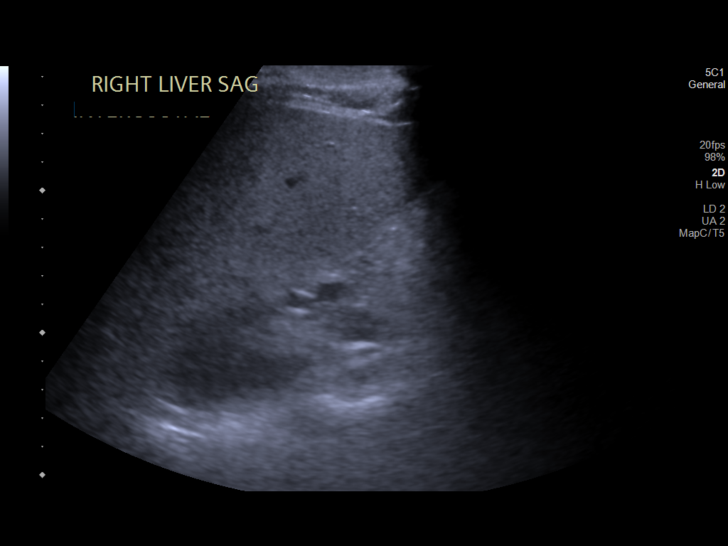
[im 44/44]
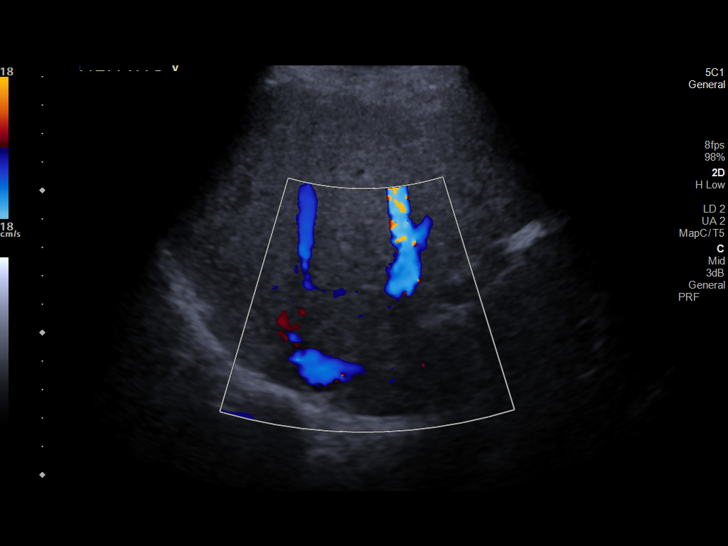

[14 of 25 positions shown; findings below may reference images not displayed]

FINDINGS: Gallbladder:

No gallstones or wall thickening visualized. No sonographic Murphy
sign noted by sonographer.

Common bile duct:

Diameter: Normal caliber, 4 mm

Liver:

Increased echotexture compatible with fatty infiltration. No focal
abnormality or biliary ductal dilatation. Portal vein is patent on
color Doppler imaging with normal direction of blood flow towards
the liver.

Other: None.
IMPRESSION: Fatty infiltration of the liver.

No acute findings.

## 2020-01-21 MED ORDER — AMLODIPINE BESYLATE 10 MG PO TABS
10.0000 mg | ORAL_TABLET | Freq: Every day | ORAL | Status: DC
  Administered 2020-01-21 – 2020-01-27 (×7): 10 mg via ORAL
  Filled 2020-01-21 (×7): qty 1

## 2020-01-21 MED ORDER — POTASSIUM PHOSPHATES 15 MMOLE/5ML IV SOLN
30.0000 mmol | Freq: Once | INTRAVENOUS | Status: AC
  Administered 2020-01-21: 30 mmol via INTRAVENOUS
  Filled 2020-01-21: qty 10

## 2020-01-21 MED ORDER — ASPIRIN EC 81 MG PO TBEC
81.0000 mg | DELAYED_RELEASE_TABLET | Freq: Every day | ORAL | Status: DC
  Administered 2020-01-21 – 2020-01-27 (×7): 81 mg via ORAL
  Filled 2020-01-21 (×7): qty 1

## 2020-01-21 NOTE — Progress Notes (Addendum)
NEUROLOGY CONSULTATION PROGRESS NOTE   Date of service: January 21, 2020 Patient Name: Dominic Carter MRN:  [id number] DOB:  [birth year]  Brief HPI  Dominic Carter is a 64 y.o. male with EtOh use, prior R temporal stroke that he is unaware of, who presents with first time seizure.   Interval Hx   He is somnolent after he was given Ativan to help with agitation. Has not had any more seizures. He is now in restraints.  Vitals   Vitals:   01/20/20 2045 01/21/20 0115 01/21/20 0347 01/21/20 0809  BP: 140/80 (!) 154/88 (!) 147/88 139/88  Pulse: 90 89 69 79  Resp: 20 20 (!) 21 20  Temp: 98 F (36.7 C) 98.1 F (36.7 C) 98.1 F (36.7 C) 98 F (36.7 C)  TempSrc: Oral Oral Oral Oral  SpO2: 100% 95% 98% 100%  Weight:      Height:         Body mass index is 23.71 kg/m.  Physical Exam   General: Laying comfortably in bed; in no acute distress. HENT: Unable to get him to open his mouth, normal external appearance of his mouth. Normal external appearance of ears and nose. Neck: Supple, no pain or tenderness CV: No JVD. No peripheral edema. Pulmonary: Symmetric Chest rise. Normal respiratory effort. Abdomen: Soft to touch, non-tender. Ext: No cyanosis, edema, or deformity Skin: No rash. Normal palpation of skin.  Musculoskeletal: Normal digits and nails by inspection. No clubbing.  Neurologic Examination  Mental status/Cognition: somnolent, opens eyes to voice, oriented to self, place but not to month and year. Keeps falling asleep during my assessment. Speech/language: dysarthric speech, whispery, comprehension intact to simple commands, able to name objects, able to repeat. Cranial nerves:   CN II Pupils equal and reactive to light, no obvious VF deficit(difficult to asess with somnolence)   CN III,IV,VI EOM intact, no gaze preference or deviation, no nystagmus   CN V normal sensation in V1, V2, and V3 segments bilaterally   CN VII Symmetric smile, no nasolabial fold  flattening.   CN VIII normal hearing to speech   CN IX & X Unable to get him to open his mouth   CN XI 5/5 head turn and 5/5 shoulder shrug bilaterally   CN XII    Motor:  Muscle bulk: poor, tone normal  Moves all extremities spontaneously and antigravity. Hand grip is 5/5 bilaterally. Can wiggle toes on command. Difficult to do full strength testing with his somnolence and restraints and attempting to get out of the bed.  Sensation:  Light touch Intact in all extremities.   Pin prick    Temperature    Vibration   Proprioception    Coordination/Complex Motor:  Unable to assess.  Labs   Basic Metabolic Panel:  Lab Results  Component Value Date   NA 136 01/21/2020   K 3.5 01/21/2020   CO2 22 01/21/2020   GLUCOSE 73 01/21/2020   BUN 6 (L) 01/21/2020   CREATININE 0.85 01/21/2020   CALCIUM 8.7 (L) 01/21/2020   GFRNONAA >60 01/21/2020   GFRAA >60 10/09/2019   HbA1c:  Lab Results  Component Value Date   HGBA1C 5.4 01/20/2020   LDL:  Lab Results  Component Value Date   LDLCALC 129 (H) 01/20/2020   Urine Drug Screen:     Component Value Date/Time   LABOPIA NONE DETECTED 01/19/2020 2351   COCAINSCRNUR NONE DETECTED 01/19/2020 2351   COCAINSCRNUR NEGATIVE 07/29/2011 1917   LABBENZ NONE DETECTED 01/19/2020  2351   AMPHETMU NONE DETECTED 01/19/2020 2351   THCU NONE DETECTED 01/19/2020 2351   LABBARB NONE DETECTED 01/19/2020 2351    Alcohol Level     Component Value Date/Time   ETH 46 (H) 01/19/2020 2216   No results found for: PHENYTOIN, ZONISAMIDE, LAMOTRIGINE, LEVETIRACETA No results found for: PHENYTOIN, PHENOBARB, VALPROATE, CBMZ  Imaging and Diagnostic studies  Results for orders placed during the hospital encounter of 01/19/20     Impression   Dominic Carter is a 64 y.o. male with PMH significant for previous right temporal infarct (apparently unknown to patient) who presents with two seizures. No further seizures in the hospital. EtOH levels on  presentation were elevated to 46. The semiology of the episode was focal in onset and patient has a right temporal stroke on MRI.  Therefore patient has a high likelihood of recurrence of seizures and needs to be on antiepileptic drugs.  Stroke workup with MRA Head and Neck with no new proximal intracranial vessel occlusion. 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 HbA1c of 5.4, LDL of 129. TTE with EF of 40%, mild LV hypertrophy, no intracardiac source of emboli.  Recommendations  - Continue Keppra 500mg  BID - continue Aspirin 81mg  and Atorvastatin 80mg  daily - Recommend not using EtOH use long term (please reiterate this when he is more awake) - No driving for 6 months (Please reiterate this when he is more awake) - Follow up with neurology in 12-16 weeks. - Neurology inpatient team will signoff. Please feel free to contact with any questions or concerns.  ______________________________________________________________________   Thank you for the opportunity to take part in the care of this patient. If you have any further questions, please contact the neurology consultation attending.  Signed,  [facility] Pager Number [phone]  Seizure precautions: Per [facility] statutes, patients with seizures are not allowed to drive until they have been seizure-free for six months and cleared by a physician    Use caution when using heavy equipment or power tools. Avoid working on ladders or at heights. Take showers instead of baths. Ensure the water temperature is not too high on the home water heater. Do not go swimming alone. Do not lock yourself in a room alone (i.e. bathroom). When caring for infants or small children, sit down when holding, feeding, or changing them to minimize risk of injury to the child in the event you have a seizure. Maintain good sleep hygiene. Avoid  alcohol.    If patient has another seizure, call 911 and bring them back to the ED if: A.  The seizure lasts longer than 5 minutes.      B.  The patient doesn't wake shortly after the seizure or has new problems such as difficulty seeing, speaking or moving following the seizure C.  The patient was injured during the seizure D.  The patient has a temperature over 102 F (39C) E.  The patient vomited during the seizure and now is having trouble breathing    During the Seizure   - First, ensure adequate ventilation and place patients on the floor on their left side  Loosen clothing around the neck and ensure the airway is patent. If the patient is clenching the teeth, do not force the mouth open with any object as this can cause severe damage - Remove all items from the surrounding that can be hazardous. The patient may  be oblivious to what's happening and may not even know what he or she is doing. If the patient is confused and wandering, either gently guide him/her away and block access to outside areas - Reassure the individual and be comforting - Call 911. In most cases, the seizure ends before EMS arrives. However, there are cases when seizures may last over 3 to 5 minutes. Or the individual may have developed breathing difficulties or severe injuries. If a pregnant patient or a person with diabetes develops a seizure, it is prudent to call an ambulance. - Finally, if the patient does not regain full consciousness, then call EMS. Most patients will remain confused for about 45 to 90 minutes after a seizure, so you must use judgment in calling for help. - Avoid restraints but make sure the patient is in a bed with padded side rails - Place the individual in a lateral position with the neck slightly flexed; this will help the saliva drain from the mouth and prevent the tongue from falling backward - Remove all nearby furniture and other hazards from the area - Provide verbal assurance as the  individual is regaining consciousness - Provide the patient with privacy if possible - Call for help and start treatment as ordered by the caregiver    After the Seizure (Postictal Stage)   After a seizure, most patients experience confusion, fatigue, muscle pain and/or a headache. Thus, one should permit the individual to sleep. For the next few days, reassurance is essential. Being calm and helping reorient the person is also of importance.   Most seizures are painless and end spontaneously. Seizures are not harmful to others but can lead to complications such as stress on the lungs, brain and the heart. Individuals with prior lung problems may develop labored breathing and respiratory distress.

## 2020-01-21 NOTE — Progress Notes (Signed)
PROGRESS NOTE    Dominic Carter  ZOX:[id number] DOB: [birth year] DOA: 01/19/2020 PCP: Patient, No Pcp Per   Brief Narrative:  HPI: Dominic Carter is a 64 y.o. 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  Pt presents to the ED after seizure at home.  Per roommates significant other: Pt reportedly standing in doorway at home this evening, trembling all over, and unable to speak.  EMS called.  Pt AAOx2 with EMS and post ictal on arrival to ED.  Pt apparently drinks EtOH daily.  Patient unable to contribute much to history currently due to post-ictal state.   ED Course: While in the ED:  CT head reveals a chronic appearing posterior R MCA stroke.  This is new since 2017 (when he last had a CT head on file).  Doesn't look like his PCP is aware of any stroke history based on April 2021 office note.  Pt also denies having had a stroke previously that he knows of.  Pt tremulous in ED, ultimately ends up having another tonic-clonic seizure around MN, this is terminated with Ativan given by EDP.  Pt now very post-ictal.  Assessment & Plan:   Principal Problem:   Seizure (HCC) Active Problems:   ETOH abuse   HTN (hypertension)   Chronic ischemic right MCA stroke  Seizure: EEG suggestive of cortical dysfunction in right temporal region consistent with underlying stroke but no epileptiform discharges.  CT and MRI brain negative for acute stroke.  No more seizures since admission.  Was slightly slow this morning and oriented only x2.  Neurology signed off.  We will continue Keppra 500 mg p.o. twice daily per their recommendations.  EtOH abuse: Alcohol level 46 upon presentation.  CIWA peaked at 14 at 9 PM yesterday but has been less than 10 since last 12 hours.  For some reason, he was given Ativan this morning with no CIWA indication.  Perhaps for agitation.  We will continue current CIWA protocol with as needed Ativan.  No signs  of withdrawal at this point in time.  Alcohol cessation counseling provided.  Essential hypertension: Slightly elevated diastolic.  Resume home dose of amlodipine.  Hyperammonemia: Ammonia 67 yesterday and 38 today.  Wonder if he has cirrhosis.  Order ultrasound abdomen and acute viral hepatitis panel.    History of stroke: MRI shows remote posterior right MCA and remote lacunar infarcts of the cerebellum bilaterally.  Patient not aware of any history of a stroke.  He has been started on aspirin 325 mg.  Will reduce it to 81 mg.  No focal deficit.  Hyperlipidemia: Cholesterol 219 and LDL 129.  Continue atorvastatin 80 mg and discharged on this.  DVT prophylaxis: enoxaparin (LOVENOX) injection 40 mg Start: 01/20/20 1000   Code Status: Full Code  Family Communication:  None present at bedside.  Plan of care discussed with patient in length and he verbalized understanding and agreed with it.  Status is: Observation  The patient will require care spanning > 2 midnights and should be moved to inpatient because: Inpatient level of care appropriate due to severity of illness  Dispo: The patient is from: Home              Anticipated d/c is to: Home, consulting PT OT today              Anticipated d/c date is: 1 day  Patient currently is not medically stable to d/c.        Estimated body mass index is 23.71 kg/m as calculated from the following:   Height as of this encounter: 5\' 11"  (1.803 m).   Weight as of this encounter: 77.1 kg.      Nutritional status:               Consultants:   Neurology signed off  Procedures:   None  Antimicrobials:  Anti-infectives (From admission, onward)   None         Subjective: Patient seen and examined today.  Alert but oriented x2 only.  Had no complaint.  He thought the president is Bush.  Objective: Vitals:   01/21/20 0115 01/21/20 0347 01/21/20 0809 01/21/20 1149  BP: (!) 154/88 (!) 147/88 139/88 (!)  126/91  Pulse: 89 69 79 (!) 105  Resp: 20 (!) 21 20 20   Temp: 98.1 F (36.7 C) 98.1 F (36.7 C) 98 F (36.7 C) 98.2 F (36.8 C)  TempSrc: Oral Oral Oral Oral  SpO2: 95% 98% 100% 100%  Weight:      Height:        Intake/Output Summary (Last 24 hours) at 01/21/2020 1311 Last data filed at 01/21/2020 1000 Gross per 24 hour  Intake 120 ml  Output --  Net 120 ml   Filed Weights   01/19/20 2158  Weight: 77.1 kg    Examination:  General exam: Appears calm and comfortable  Respiratory system: Clear to auscultation. Respiratory effort normal. Cardiovascular system: S1 & S2 heard, RRR. No JVD, murmurs, rubs, gallops or clicks. No pedal edema. Gastrointestinal system: Abdomen is nondistended, soft and nontender. No organomegaly or masses felt. Normal bowel sounds heard. Central nervous system: Alert and oriented x2. No focal neurological deficits. Extremities: Right BKA Skin: No rashes, lesions or ulcers Psychiatry: Judgement and insight appear normal. Mood & affect appropriate.    Data Reviewed: I have personally reviewed following labs and imaging studies  CBC: Recent Labs  Lab 01/19/20 2216 01/21/20 0428  WBC 6.8 4.0  NEUTROABS 4.2  --   HGB 13.8 14.4  HCT 41.8 43.5  MCV 96.3 92.4  PLT 124* 109*   Basic Metabolic Panel: Recent Labs  Lab 01/19/20 2216 01/20/20 0057 01/21/20 0428  NA 137  --  136  K 3.7  --  3.5  CL 100  --  100  CO2 16*  --  22  GLUCOSE 79  --  73  BUN 5*  --  6*  CREATININE 0.87  --  0.85  CALCIUM 8.7*  --  8.7*  MG  --  1.8  --   PHOS  --  2.0*  --    GFR: Estimated Creatinine Clearance: 93.5 mL/min (by C-G formula based on SCr of 0.85 mg/dL). Liver Function Tests: Recent Labs  Lab 01/19/20 2216  AST 82*  ALT 41  ALKPHOS 99  BILITOT 1.0  PROT 7.4  ALBUMIN 3.8   No results for input(s): LIPASE, AMYLASE in the last 168 hours. Recent Labs  Lab 01/19/20 2217 01/21/20 1012  AMMONIA 67* 38*   Coagulation Profile: No results  for input(s): INR, PROTIME in the last 168 hours. Cardiac Enzymes: No results for input(s): CKTOTAL, CKMB, CKMBINDEX, TROPONINI in the last 168 hours. BNP (last 3 results) No results for input(s): PROBNP in the last 8760 hours. HbA1C: Recent Labs    01/20/20 0402  HGBA1C 5.4   CBG: Recent Labs  Lab 01/19/20 2231  GLUCAP 79   Lipid Profile: Recent Labs    01/20/20 0402  CHOL 219*  HDL 73  LDLCALC 129*  TRIG 85  CHOLHDL 3.0   Thyroid Function Tests: No results for input(s): TSH, T4TOTAL, FREET4, T3FREE, THYROIDAB in the last 72 hours. Anemia Panel: No results for input(s): VITAMINB12, FOLATE, FERRITIN, TIBC, IRON, RETICCTPCT in the last 72 hours. Sepsis Labs: No results for input(s): PROCALCITON, LATICACIDVEN in the last 168 hours.  Recent Results (from the past 240 hour(s))  Respiratory Panel by RT PCR (Flu A&B, Covid) - Nasopharyngeal Swab     Status: None   Collection Time: 01/20/20  2:29 AM   Specimen: Nasopharyngeal Swab  Result Value Ref Range Status   SARS Coronavirus 2 by RT PCR NEGATIVE NEGATIVE Final    Comment: (NOTE) SARS-CoV-2 target nucleic acids are NOT DETECTED.  The SARS-CoV-2 RNA is generally detectable in upper respiratoy specimens during the acute phase of infection. The lowest concentration of SARS-CoV-2 viral copies this assay can detect is 131 copies/mL. A negative result does not preclude SARS-Cov-2 infection and should not be used as the sole basis for treatment or other patient management decisions. A negative result may occur with  improper specimen collection/handling, submission of specimen other than nasopharyngeal swab, presence of viral mutation(s) within the areas targeted by this assay, and inadequate number of viral copies (<131 copies/mL). A negative result must be combined with clinical observations, patient history, and epidemiological information. The expected result is Negative.  Fact Sheet for Patients:    https://www.moore.com/  Fact Sheet for Healthcare Providers:  https://www.young.biz/  This test is no t yet approved or cleared by the [place] FDA and  has been authorized for detection and/or diagnosis of SARS-CoV-2 by FDA under an Emergency Use Authorization (EUA). This EUA will remain  in effect (meaning this test can be used) for the duration of the COVID-19 declaration under Section 564(b)(1) of the Act, 21 U.S.C. section 360bbb-3(b)(1), unless the authorization is terminated or revoked sooner.     Influenza A by PCR NEGATIVE NEGATIVE Final   Influenza B by PCR NEGATIVE NEGATIVE Final    Comment: (NOTE) The Xpert Xpress SARS-CoV-2/FLU/RSV assay is intended as an aid in  the diagnosis of influenza from Nasopharyngeal swab specimens and  should not be used as a sole basis for treatment. Nasal washings and  aspirates are unacceptable for Xpert Xpress SARS-CoV-2/FLU/RSV  testing.  Fact Sheet for Patients: https://www.moore.com/  Fact Sheet for Healthcare Providers: https://www.young.biz/  This test is not yet approved or cleared by the [place] FDA and  has been authorized for detection and/or diagnosis of SARS-CoV-2 by  FDA under an Emergency Use Authorization (EUA). This EUA will remain  in effect (meaning this test can be used) for the duration of the  Covid-19 declaration under Section 564(b)(1) of the Act, 21  U.S.C. section 360bbb-3(b)(1), unless the authorization is  terminated or revoked. Performed at [facility], [address] [place], [place] [address]       Radiology Studies: EEG  Result Date: 01/20/2020 Dominic Quest, MD     01/20/2020  9:52 AM Patient Name: Dominic Carter MRN: [id number] Epilepsy Attending: Charlsie Carter Referring Physician/Provider: Ritta Slot Date: 01/20/2020 Duration: 26.32 mins Patient history:  64 year old male with a  previous right temporal infarct (apparently unknown to patient) who presents with two seizures. EEG to evaluate for seizure. Level of alertness: Awake, asleep AEDs during EEG study: LEV Technical aspects: This EEG  study was done with scalp electrodes positioned according to the 10-20 International system of electrode placement. Electrical activity was acquired at a sampling rate of 500Hz  and reviewed with a high frequency filter of 70Hz  and a low frequency filter of 1Hz . EEG data were recorded continuously and digitally stored. Description: The posterior dominant rhythm consists of 9 Hz activity of moderate voltage (25-35 uV) seen predominantly in posterior head regions, symmetric and reactive to eye opening and eye closing. Sleep was characterized by vertex waves, sleep spindles (12 to 14 Hz), maximal frontocentral region.  EEG showed continuous 3 to 6 Hz theta-delta slowing in right temporal region.  Hyperventilation and photic stimulation were not performed.   ABNORMALITY -Continuous slow, right temporal region IMPRESSION: This study is suggestive of cortical dysfunction in right temporal region consistent with underlying stroke.  No seizures or epileptiform discharges were seen throughout the recording.   DG Abd 1 View  Result Date: 01/20/2020 CLINICAL DATA:  Seizures EXAM: ABDOMEN - 1 VIEW COMPARISON:  None. FINDINGS: Normal abdominal gas pattern. No organomegaly. Vascular stents are seen within the right common iliac artery and terminal aorta. No unexpected metallic foreign body within the visualized abdomen. IMPRESSION: No unexpected metallic foreign body within the visualized abdomen. Electronically Signed   By: MD   On: 01/20/2020 03:26   CT Head Wo Contrast  Result Date: 01/19/2020 CLINICAL DATA:  64 year old male status post unwitnessed seizure. EXAM: CT HEAD WITHOUT CONTRAST TECHNIQUE: Contiguous axial images were obtained from the base of the skull through the  vertex without intravenous contrast. COMPARISON:  Head CT, CTA head and neck 03/02/2016 FINDINGS: Brain: Moderate sized chronic infarct with encephalomalacia in the posterior right MCA territory, new since 2017. Mild ex vacuo ventricular enlargement. Progressed bilateral cerebral white matter hypodensity also since 2017. But otherwise stable gray-white matter differentiation. No midline shift, ventriculomegaly, mass effect, evidence of mass lesion, intracranial hemorrhage or evidence of cortically based acute infarction. Gray-white matter differentiation is within normal limits throughout the brain. Vascular: Calcified atherosclerosis at the skull base. No suspicious intracranial vascular hyperdensity. Skull: Stable, intact. Sinuses/Orbits: Progressed chronic sinus disease since 2017, most affecting the sphenoid and right maxillary sinuses. Tympanic cavities and mastoids remain clear. Other: No acute orbit or scalp soft tissue injury. IMPRESSION: 1. No acute intracranial abnormality or acute traumatic injury identified. 2. Chronic posterior right MCA territory infarct, new since 2017. 3. Progressed chronic paranasal sinus disease. Electronically Signed   By: 2018 M.D.   On: 01/19/2020 23:13   MR ANGIO HEAD WO CONTRAST  Result Date: 01/20/2020 CLINICAL DATA:  Stroke, follow-up; seizure EXAM: MRA HEAD WITHOUT CONTRAST TECHNIQUE: Angiographic images of the Circle of Willis were obtained using MRA technique without intravenous contrast. COMPARISON:  CT 11/25/2015 FINDINGS: Intracranial internal carotid arteries are patent. Middle and anterior cerebral arteries are patent. Congenitally absent right A1 ACA. There is absent visualization of the proximal intracranial right vertebral artery. Reconstitution at the level of the PICA origin probably via retrograde flow. This was present on 2017 CTA. Intracranial left right vertebral artery, basilar artery, posterior cerebral arteries are patent. Large bilateral  posterior communicating arteries are present with fetal or near fetal origin of the posterior cerebral arteries. IMPRESSION: No new proximal intracranial vessel occlusion. 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 Electronically Signed   By: 11/27/2015 M.D.   On: 01/20/2020 11:04   MR BRAIN WO CONTRAST  Result Date: 01/20/2020 CLINICAL DATA:  Stroke, follow-up.  Seizure. EXAM: MRI HEAD WITHOUT CONTRAST TECHNIQUE: Multiplanar, multiecho pulse sequences of the brain and surrounding structures were obtained without intravenous contrast. The examination had to be discontinued prior to completion due to patient would not tolerate additional sequences. COMPARISON:  CT head without contrast 01/19/2020 FINDINGS: Brain: Remote posterior right MCA territory infarct is confirmed. Ex vacuo dilation of the right lateral ventricle is present. Periventricular white matter changes are moderately advanced for age. Asymmetric changes are present in the right corona radiata. No acute infarct, hemorrhage, or mass lesion is present. The ventricles are proportionate to the degree of atrophy. No significant extraaxial fluid collection is present. Brainstem demonstrates wallerian changes along the right cortical spinal tracts. Remote lacunar infarcts are present within the cerebellum bilaterally. Vascular: Flow is present in the major intracranial arteries. Skull and upper cervical spine: The craniocervical junction is normal. Upper cervical spine is within normal limits. Marrow signal is unremarkable. Sinuses/Orbits: Right maxillary bilateral posterior ethmoid and sphenoid sinus disease is chronic. The globes and orbits are within normal limits. Mastoid air cells are clear. IMPRESSION: 1. No acute intracranial abnormality. 2. Remote posterior right MCA territory infarct. 3. Remote lacunar infarcts of the cerebellum bilaterally. 4. Atrophy and white  matter disease is moderately advanced for age. This likely reflects the sequela of chronic microvascular ischemia. 5. 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 Electronically Signed   By: Marin Roberts M.D.   On: 01/20/2020 05:09   DG Chest Portable 1 View  Result Date: 01/19/2020 CLINICAL DATA:  Fall, altered mental status, seizure EXAM: PORTABLE CHEST 1 VIEW COMPARISON:  None. FINDINGS: The heart size and mediastinal contours are within normal limits. Both lungs are clear. The visualized skeletal structures are unremarkable. IMPRESSION: No active disease. Electronically Signed   By: Helyn Numbers MD   On: 01/19/2020 23:10   ECHOCARDIOGRAM COMPLETE  Result Date: 01/20/2020    ECHOCARDIOGRAM REPORT   Patient Name:   Dominic Carter Date of Exam: 01/20/2020 Medical Rec #:  [id number]         Height:       71.0 in Accession #:    [id number]        Weight:       170.0 lb Date of Birth:  12/27/1955          BSA:          1.968 m Patient Age:    64 years          BP:           151/80 mmHg Patient Gender: M                 HR:           80 bpm. Exam Location:  Inpatient Procedure: 2D Echo and Intracardiac Opacification Agent Indications:    Stroke 434.91 / I163.9  History:        Patient has no prior history of Echocardiogram examinations.                 Risk Factors:Hypertension and Dyslipidemia. Past history of DVT.                 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  Sonographer:    Leta Jungling RDCS Referring Phys: [id number] JARED M GARDNER  Sonographer Comments: Technically difficult study due to poor echo windows. Imaging difficult due to patients altered mental status. IMPRESSIONS  1. Left  ventricular ejection fraction, by estimation, is 40%. The left ventricle has mildly decreased function. The left ventricle demonstrates regional wall motion abnormalities (see scoring diagram/findings for description). There is mild left ventricular hypertrophy. Left ventricular diastolic  parameters are consistent with Grade I diastolic dysfunction (impaired relaxation).  2. Right ventricular systolic function is mildly reduced. The right ventricular size is normal. Tricuspid regurgitation signal is inadequate for assessing PA pressure.  3. The mitral valve is normal in structure. No evidence of mitral valve regurgitation. No evidence of mitral stenosis.  4. The aortic valve is grossly normal. Aortic valve regurgitation is mild. No aortic stenosis is present.  5. The inferior vena cava is normal in size with greater than 50% respiratory variability, suggesting right atrial pressure of 3 mmHg. Conclusion(s)/Recommendation(s): No intracardiac source of embolism detected on this transthoracic study. A transesophageal echocardiogram is recommended to exclude cardiac source of embolism if clinically indicated. No left ventricular mural or apical thrombus/thrombi. FINDINGS  Left Ventricle: Left ventricular ejection fraction, by estimation, is 40%. The left ventricle has mildly decreased function. The left ventricle demonstrates regional wall motion abnormalities. Definity contrast agent was given IV to delineate the left ventricular endocardial borders. The left ventricular internal cavity size was normal in size. There is mild left ventricular hypertrophy. Left ventricular diastolic parameters are consistent with Grade I diastolic dysfunction (impaired relaxation).  LV Wall Scoring: The anterior septum, mid inferoseptal segment, and basal inferoseptal segment are hypokinetic. Right Ventricle: The right ventricular size is normal. No increase in right ventricular wall thickness. Right ventricular systolic function is mildly reduced. Tricuspid regurgitation signal is inadequate for assessing PA pressure. Left Atrium: Left atrial size was normal in size. Right Atrium: Right atrial size was normal in size. Pericardium: There is no evidence of pericardial effusion. Mitral Valve: The mitral valve is normal in  structure. No evidence of mitral valve regurgitation. No evidence of mitral valve stenosis. Tricuspid Valve: The tricuspid valve is normal in structure. Tricuspid valve regurgitation is not demonstrated. No evidence of tricuspid stenosis. Aortic Valve: The aortic valve is grossly normal. Aortic valve regurgitation is mild. No aortic stenosis is present. Pulmonic Valve: The pulmonic valve was normal in structure. Pulmonic valve regurgitation is trivial. No evidence of pulmonic stenosis. Aorta: The aortic root and ascending aorta are structurally normal, with no evidence of dilitation. Venous: The inferior vena cava is normal in size with greater than 50% respiratory variability, suggesting right atrial pressure of 3 mmHg. IAS/Shunts: The interatrial septum was not well visualized.  LEFT VENTRICLE PLAX 2D LVIDd:         5.36 cm      Diastology LVIDs:         4.57 cm      LV e' medial:    5.59 cm/s LV PW:         1.04 cm      LV E/e' medial:  13.6 LV IVS:        0.93 cm      LV e' lateral:   6.89 cm/s LVOT diam:     2.30 cm      LV E/e' lateral: 11.1 LV SV:         62 LV SV Index:   32 LVOT Area:     4.15 cm  LV Volumes (MOD) LV vol d, MOD A2C: 112.0 ml LV vol d, MOD A4C: 117.0 ml LV vol s, MOD A2C: 85.3 ml LV vol s, MOD A4C: 57.5 ml LV SV MOD A2C:  26.7 ml LV SV MOD A4C:     117.0 ml LV SV MOD BP:      43.4 ml RIGHT VENTRICLE RV S prime:     11.00 cm/s TAPSE (M-mode): 2.0 cm LEFT ATRIUM             Index       RIGHT ATRIUM           Index LA diam:        2.70 cm 1.37 cm/m  RA Area:     15.10 cm LA Vol (A2C):   38.6 ml 19.62 ml/m RA Volume:   37.50 ml  19.06 ml/m LA Vol (A4C):   50.8 ml 25.82 ml/m LA Biplane Vol: 44.7 ml 22.72 ml/m  AORTIC VALVE LVOT Vmax:   100.00 cm/s LVOT Vmean:  61.200 cm/s LVOT VTI:    0.150 m  AORTA Ao Root diam: 3.70 cm MITRAL VALVE MV Area (PHT): 3.27 cm     SHUNTS MV Decel Time: 232 msec     Systemic VTI:  0.15 m MV E velocity: 76.30 cm/s   Systemic Diam: 2.30 cm MV A velocity:  111.00 cm/s MV E/A ratio:  0.69 Dominic Brass MD Electronically signed by Dominic Brass MD Signature Date/Time: 01/20/2020/10:49:44 AM    Final    VAS US CAROTID (at [facility] and [facility] only)  Result Date: 01/20/2020 Carotid Arterial Duplex Study Indications:       CVA. Risk Factors:      Hypertension. Limitations        Today's exam was limited due to the patient's inability or                    unwillingness to cooperate, the patient's respiratory                    variation and patient confusion, patient somnolence, patient                    movement. Comparison Study:  No prior studies. Performing Technologist: Chanda Busing RVT  Examination Guidelines: A complete evaluation includes B-mode imaging, spectral Doppler, color Doppler, and power Doppler as needed of all accessible portions of each vessel. Bilateral testing is considered an integral part of a complete examination. Limited examinations for reoccurring indications may be performed as noted.  Right Carotid Findings: +----------+--------+--------+--------+-----------------------+--------+             PSV cm/s EDV cm/s Stenosis Plaque Description      Comments  +----------+--------+--------+--------+-----------------------+--------+  CCA Prox   87       16                smooth and heterogenous           +----------+--------+--------+--------+-----------------------+--------+  CCA Distal 56       11                smooth and heterogenous           +----------+--------+--------+--------+-----------------------+--------+  ICA Prox   34       10                smooth and heterogenous           +----------+--------+--------+--------+-----------------------+--------+  ICA Distal 51       12  tortuous  +----------+--------+--------+--------+-----------------------+--------+  ECA        84       10                                                  +----------+--------+--------+--------+-----------------------+--------+  +----------+--------+-------+--------+-------------------+             PSV cm/s EDV cms Describe Arm Pressure (mmHG)  +----------+--------+-------+--------+-------------------+  Subclavian 73                                             +----------+--------+-------+--------+-------------------+ +---------+--------+--+--------+--+---------+  Vertebral PSV cm/s 24 EDV cm/s 10 Antegrade  +---------+--------+--+--------+--+---------+  Left Carotid Findings: +----------+--------+--------+--------+-----------------------+--------+             PSV cm/s EDV cm/s Stenosis Plaque Description      Comments  +----------+--------+--------+--------+-----------------------+--------+  CCA Prox   90       20                smooth and heterogenous           +----------+--------+--------+--------+-----------------------+--------+  CCA Distal 64       13                smooth and heterogenous           +----------+--------+--------+--------+-----------------------+--------+  ICA Prox   39       12                smooth and heterogenous           +----------+--------+--------+--------+-----------------------+--------+  ICA Distal 52       17                                        tortuous  +----------+--------+--------+--------+-----------------------+--------+  ECA        106      17                                                  +----------+--------+--------+--------+-----------------------+--------+ +----------+--------+--------+--------+-------------------+             PSV cm/s EDV cm/s Describe Arm Pressure (mmHG)  +----------+--------+--------+--------+-------------------+  Subclavian 147                                             +----------+--------+--------+--------+-------------------+ +---------+--------+--+--------+--+---------+  Vertebral PSV cm/s 25 EDV cm/s 10 Antegrade  +---------+--------+--+--------+--+---------+   Summary: Right Carotid: Velocities in the right ICA are consistent with a 1-39% stenosis. Left Carotid:  Velocities in the left ICA are consistent with a 1-39% stenosis. Vertebrals: Bilateral vertebral arteries demonstrate antegrade flow. *See table(s) above for measurements and observations.  Electronically signed by Sherald Hess MD on 01/20/2020 at 2:23:46 PM.    Final     Scheduled Meds:  aspirin  300 mg Rectal Daily   Or   aspirin  325 mg Oral Daily   atorvastatin  80 mg Oral Daily   enoxaparin (LOVENOX) injection  40 mg Subcutaneous Q24H   folic acid  1 mg Oral Daily   lactulose  20 g Oral Once   levETIRAcetam  500 mg Oral BID   multivitamin with minerals  1 tablet Oral Daily   nicotine  21 mg Transdermal Daily   thiamine  100 mg Oral Daily   Or   thiamine  100 mg Intravenous Daily   Continuous Infusions:  potassium PHOSPHATE IVPB (in mmol) 30 mmol (01/21/20 1010)     LOS: 0 days   Time spent: 34 minutes   Hughie Closs, MD [facility]  01/21/2020, 1:11 PM   To contact the attending provider between 7A-7P or the covering provider during after hours 7P-7A, please log into the web site [email or web address].

## 2020-01-21 NOTE — Care Management Obs Status (Signed)
MEDICARE OBSERVATION STATUS NOTIFICATION   Patient Details  Name: Dominic Carter MRN: [id number] Date of Birth: [birth year]   Medicare Observation Status Notification Given:  Yes    Lawerance Sabal, RN 01/21/2020, 9:23 AM

## 2020-01-21 NOTE — Progress Notes (Addendum)
Pt became more agitated after coming back from Ultrasound, stating he is ready to go home, IV access was lost deyaing Ativan administration, alert to self, CIWA assessment documented and ativan given as per MD order, will reassess Q hr, at the moment , pt sleepy, less restless, will reassess as needed.

## 2020-01-21 NOTE — Progress Notes (Signed)
Daughter Lowanda Foster) was called and notified of the bilateral soft restaints

## 2020-01-21 NOTE — Progress Notes (Signed)
Soft wrist restraints were initiated at around 0100, attempted to call daughter Lowanda Foster) but unsuccessful, left voicemail to call back the Hospital to be updated regarding this new order. Will continue to monitor him

## 2020-01-21 NOTE — Evaluation (Signed)
Occupational Therapy Evaluation Patient Details Name: Dominic Carter MRN: [id number] DOB: 08/30/1955 Today's Date: 01/21/2020    History of Present Illness Patient is a 64 y/o male who presents with confusion and new onset seizures. 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 daily ETOH use    Clinical Impression   Pt admitted with above. He demonstrates the below listed deficits and will benefit from continued OT to maximize safety and independence with BADLs.  Pt presents to OT with generalized weakness, impaired balance, decreased activity tolerance, impaired cognition including deficits with attention, problem solving, sequencing, safety awareness.  He has moments in which he stares off in space and is subsequently slow to respond afterwards - RN notified.  He currently requires min - total A for ADLs and reports he lives with his sister in law.  He reports he ambulates with RW and no prosthesis, but has significant calluses on bil. Knees so I suspect he crawls quite a bit - he did acknowledge this when asked.  He reports he is modified independent with ADLs, and does not drive.  Based on current level of functioning feel he will require SNF level rehab as he is at risk to fall and requires a significant amount of assist to complete simple ADLs.  Will follow acutely.       Follow Up Recommendations  SNF;Supervision/Assistance - 24 hour    Equipment Recommendations  Wheelchair (measurements OT);Wheelchair cushion (measurements OT)    Recommendations for Other Services       Precautions / Restrictions Precautions Precautions: Fall Precaution Comments: seizure precautions      Mobility Bed Mobility Overal bed mobility: Needs Assistance Bed Mobility: Supine to Sit     Supine to sit: Min guard     General bed mobility comments: use of grab bars and up to mod cues   Transfers Overall transfer level: Needs  assistance Equipment used: None;1 person hand held assist Transfers: Lateral/Scoot Transfers          Lateral/Scoot Transfers: Mod assist General transfer comment: while discussing safe technique for transfer to the recliner, he threw himself into the chair, and required mod cues and assist to problem solve how to  scoot to the Left     Balance Overall balance assessment: Needs assistance Sitting-balance support: Feet supported;Single extremity supported;Bilateral upper extremity supported Sitting balance-Leahy Scale: Poor Sitting balance - Comments: requires UE support.  Occasionally looses balance posteriorly                                    ADL either performed or assessed with clinical judgement   ADL Overall ADL's : Needs assistance/impaired Eating/Feeding: Set up;Bed level;Sitting   Grooming: Wash/dry hands;Wash/dry face;Oral care;Brushing hair;Minimal assistance;Sitting   Upper Body Bathing: Moderate assistance;Sitting   Lower Body Bathing: Maximal assistance;Sitting/lateral leans   Upper Body Dressing : Moderate assistance;Sitting   Lower Body Dressing: Total assistance;Sitting/lateral leans   Toilet Transfer: Moderate assistance;Squat-pivot Toilet Transfer Details (indicate cue type and reason): mod cues for problem solving and sequencing  Toileting- Clothing Manipulation and Hygiene: Total assistance;Sit to/from stand Toileting - Clothing Manipulation Details (indicate cue type and reason): Pt with condom cath in place.  In the midst of transferring to the recliner, he asked OT to step out and to get him a urinal.  He then grabbed his penis and pulled pulling of his condom  catheter.  He required max A for clean up      Functional mobility during ADLs: Moderate assistance       Vision   Vision Assessment?: Vision impaired- to be further tested in functional context Additional Comments: Pt keeps his eyes closed a good bit of the time.  He requires  mod cues to locate items in his room      Perception Perception Perception Tested?: Yes Perception Deficits: Topographical orientation   Praxis Praxis Praxis tested?: Deficits Deficits: Motor Impersistence;Organization    Pertinent Vitals/Pain Pain Assessment: No/denies pain     Hand Dominance Right   Extremity/Trunk Assessment Upper Extremity Assessment Upper Extremity Assessment: Generalized weakness   Lower Extremity Assessment Lower Extremity Assessment: Generalized weakness (callouses bil. knees )   Cervical / Trunk Assessment Cervical / Trunk Assessment: Normal   Communication Communication Communication: Expressive difficulties (dysarthric )   Cognition Arousal/Alertness: Awake/alert Behavior During Therapy: Flat affect;Impulsive Overall Cognitive Status: Impaired/Different from baseline Area of Impairment: Attention;Memory;Safety/judgement;Following commands;Awareness;Problem solving                   Current Attention Level: Sustained Memory: Decreased short-term memory Following Commands: Follows one step commands consistently;Follows one step commands with increased time (with up to mod cues ) Safety/Judgement: Decreased awareness of safety;Decreased awareness of deficits Awareness: Intellectual Problem Solving: Slow processing;Decreased initiation;Difficulty sequencing;Requires verbal cues;Requires tactile cues General Comments: Pt will be initeracting then will have periods in which he stares for a moment and requires redirection to task.  He requires mod cues/assist to problem solve and sequence through basic transfers.  he is highly impulsive    General Comments       Exercises     Shoulder Instructions      Home Living Family/patient expects to be discharged to:: Private residence Living Arrangements: Non-relatives/Friends Available Help at Discharge: Available PRN/intermittently;Other (Comment) Type of Home: House Home Access: Stairs to  enter [employer] of Steps: 4 in front, 3 in back Entrance Stairs-Rails: Right Home Layout: One level     Bathroom Shower/Tub: Chief Strategy Officer: Standard     Home Equipment: Environmental consultant - 2 wheels;Cane - single point   Additional Comments: Pt reports he lives with sister in law who can assist PRN      Prior Functioning/Environment Level of Independence: Independent with assistive device(s);Needs assistance  Gait / Transfers Assistance Needed: Pt reports he doesn't trust his prosthesis and therefore, he doesn't use it.  He is vague about his mobility, but does indicate he uses a RW some of the time.  He has significant callouses bil. knees.  When asked if he crawls around most of the time, he knowledges that he does ADL's / Homemaking Assistance Needed: Pt indicates that he does his own ADLs.  Sister in law does some of the cooking.  He sometimes mows the grass, but he is unable to tell me how he does that.  He reports his landlord takes him to the grocery.  Does not drive             OT Problem List: Decreased strength;Decreased activity tolerance;Impaired balance (sitting and/or standing);Impaired vision/perception;Decreased cognition;Decreased coordination;Decreased safety awareness;Decreased knowledge of use of DME or AE      OT Treatment/Interventions: Self-care/ADL training;Neuromuscular education;DME and/or AE instruction;Therapeutic activities;Cognitive remediation/compensation;Visual/perceptual remediation/compensation;Patient/family education;Balance training    OT Goals(Current goals can be found in the care plan section) Acute Rehab OT Goals Patient Stated Goal: did not state  OT Goal Formulation: With  patient Time For Goal Achievement: 02/04/20 Potential to Achieve Goals: Good ADL Goals Pt Will Perform Grooming: with set-up;sitting Pt Will Perform Upper Body Bathing: with set-up;with supervision;sitting Pt Will Perform Lower Body Bathing: with  min guard assist;sit to/from stand Pt Will Perform Upper Body Dressing: with set-up;with supervision;sitting Pt Will Perform Lower Body Dressing: with min guard assist;sitting/lateral leans;sit to/from stand Pt Will Transfer to Toilet: with min guard assist;squat pivot transfer;stand pivot transfer;bedside commode Pt Will Perform Toileting - Clothing Manipulation and hygiene: with min guard assist;sit to/from stand;sitting/lateral leans Additional ADL Goal #1: Pt will demonstrate selective attention during ADL task and no cues  OT Frequency: Min 2X/week   Barriers to D/C: Decreased caregiver support          Co-evaluation              AM-PAC OT "6 Clicks" Daily Activity     Outcome Measure Help from another person eating meals?: A Little Help from another person taking care of personal grooming?: A Little Help from another person toileting, which includes using toliet, bedpan, or urinal?: A Lot Help from another person bathing (including washing, rinsing, drying)?: A Lot Help from another person to put on and taking off regular upper body clothing?: A Lot Help from another person to put on and taking off regular lower body clothing?: Total 6 Click Score: 13   End of Session Equipment Utilized During Treatment: Gait belt Nurse Communication: Mobility status  Activity Tolerance: Patient tolerated treatment well Patient left: in chair;with call bell/phone within reach;with chair alarm set  OT Visit Diagnosis: Unsteadiness on feet (R26.81);Cognitive communication deficit (R41.841)                Time: 3299-2426 OT Time Calculation (min): 38 min Charges:  OT General Charges $OT Visit: 1 Visit OT Evaluation $OT Eval Moderate Complexity: 1 Mod OT Treatments $Self Care/Home Management : 8-22 mins $Therapeutic Activity: 8-22 mins  Eber Jones., OTR/L Acute Rehabilitation Services Pager [phone] Office [phone]   Jeani Hawking M 01/21/2020, 12:24 PM

## 2020-01-22 DIAGNOSIS — I10 Essential (primary) hypertension: Secondary | ICD-10-CM | POA: Diagnosis not present

## 2020-01-22 LAB — COMPREHENSIVE METABOLIC PANEL
ALT: 42 U/L (ref 0–44)
AST: 121 U/L — ABNORMAL HIGH (ref 15–41)
Albumin: 3.2 g/dL — ABNORMAL LOW (ref 3.5–5.0)
Alkaline Phosphatase: 83 U/L (ref 38–126)
Anion gap: 11 (ref 5–15)
BUN: 6 mg/dL — ABNORMAL LOW (ref 8–23)
CO2: 23 mmol/L (ref 22–32)
Calcium: 9.4 mg/dL (ref 8.9–10.3)
Chloride: 102 mmol/L (ref 98–111)
Creatinine, Ser: 1 mg/dL (ref 0.61–1.24)
GFR, Estimated: >60 mL/min (ref >60)
Glucose, Bld: 96 mg/dL (ref 70–99)
Potassium: 3.7 mmol/L (ref 3.5–5.1)
Sodium: 136 mmol/L (ref 135–145)
Total Bilirubin: 1.2 mg/dL (ref 0.3–1.2)
Total Protein: 6.7 g/dL (ref 6.5–8.1)

## 2020-01-22 LAB — CBC WITH DIFFERENTIAL/PLATELET
Abs Immature Granulocytes: 0.02 10*3/uL (ref 0.00–0.07)
Basophils Absolute: 0 10*3/uL (ref 0.0–0.1)
Basophils Relative: 1 %
Eosinophils Absolute: 0.1 10*3/uL (ref 0.0–0.5)
Eosinophils Relative: 1 %
HCT: 42.3 % (ref 39.0–52.0)
Hemoglobin: 14 g/dL (ref 13.0–17.0)
Immature Granulocytes: 0 %
Lymphocytes Relative: 18 %
Lymphs Abs: 0.9 10*3/uL (ref 0.7–4.0)
MCH: 30.6 pg (ref 26.0–34.0)
MCHC: 33.1 g/dL (ref 30.0–36.0)
MCV: 92.4 fL (ref 80.0–100.0)
Monocytes Absolute: 0.7 10*3/uL (ref 0.1–1.0)
Monocytes Relative: 14 %
Neutro Abs: 3.2 10*3/uL (ref 1.7–7.7)
Neutrophils Relative %: 66 %
Platelets: 113 10*3/uL — ABNORMAL LOW (ref 150–400)
RBC: 4.58 MIL/uL (ref 4.22–5.81)
RDW: 13 % (ref 11.5–15.5)
WBC: 4.9 10*3/uL (ref 4.0–10.5)
nRBC: 0 % (ref 0.0–0.2)

## 2020-01-22 LAB — MAGNESIUM: Magnesium: 1.7 mg/dL (ref 1.7–2.4)

## 2020-01-22 LAB — AMMONIA: Ammonia: 45 umol/L — ABNORMAL HIGH (ref 9–35)

## 2020-01-22 MED ORDER — CHLORDIAZEPOXIDE HCL 25 MG PO CAPS
25.0000 mg | ORAL_CAPSULE | Freq: Three times a day (TID) | ORAL | Status: DC
  Administered 2020-01-22 – 2020-01-23 (×3): 25 mg via ORAL
  Filled 2020-01-22 (×3): qty 1

## 2020-01-22 NOTE — Progress Notes (Signed)
PROGRESS NOTE    Dominic Carter  QMV:[id number] DOB: April 09, 1955 DOA: 01/19/2020 PCP: Patient, No Pcp Per   Brief Narrative:  HPI: Dominic Carter is a 64 y.o. 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  Pt reportedly standing in doorway at home, trembling all over, and unable to speak. EMS called.  Pt AAOx2 with EMS and post ictal on arrival to ED.  Pt apparently drinks EtOH daily. 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  This is new since 2017 (when he last had a CT head on file). Pt tremulous in ED, ultimately ended up having another tonic-clonic seizure around [place], this was terminated with Ativan and loaded by Keppra given by EDP.  Patient was admitted under hospitalist service.  Neurology was consulted. EEG suggestive of cortical dysfunction in right temporal region consistent with underlying stroke but no epileptiform discharges.  CT and MRI brain negative for acute stroke but confirmed old stroke.  He was started on Keppra 500 mg twice daily.  On the morning of 01/21/2020, patient was alert and oriented x2.  Subsequently on the night of 01/21/2020, patient started having alcohol withdrawal syndrome with CIWA jumping to as high as 20.  He received several doses of Ativan.  Assessment & Plan:   Principal Problem:   Seizure (HCC) Active Problems:   ETOH abuse   HTN (hypertension)   Chronic ischemic right MCA stroke   Seizures (HCC)  Seizure: EEG suggestive of cortical dysfunction in right temporal region consistent with underlying stroke but no epileptiform discharges.  CT and MRI brain negative for acute stroke.  No more seizures since admission.  Was slightly slow this morning and oriented only x2.  Neurology signed off.  We will continue Keppra 500 mg p.o. twice daily per their recommendations.  EtOH abuse: Alcohol level 46 upon presentation.  Had significant alcohol  withdrawal overnight with CIWA reaching up to 20.  He required several doses of Ativan per CIWA protocol.  His last Ativan was around 4:50 AM this morning.  When I saw him, he was sleepy but arousable.  He was confused.  He had restraints which were placed overnight due to agitation.  We will continue CIWA protocol with as needed Ativan and also start him on Librium 25 mg 3 times daily.  Essential hypertension: Slightly elevated diastolic.  Continue home dose of amlodipine.  Hyperammonemia: Ammonia 67 upon presentation and then 38 and 45 today.  Ultrasound abdomen negative for any cirrhosis.  Acute viral hepatitis panel negative as well.  History of stroke: MRI shows remote posterior right MCA and remote lacunar infarcts of the cerebellum bilaterally.  Patient not aware of any history of a stroke.  Continue aspirin.  Hemoglobin A1c 5.4.  Hyperlipidemia: Cholesterol 219 and LDL 129.  Continue atorvastatin 80 mg and discharged on this.  DVT prophylaxis: enoxaparin (LOVENOX) injection 40 mg Start: 01/20/20 1000   Code Status: Full Code  Family Communication:  None present at bedside.  Plan of care discussed with his daughter Loretha Stapler over the phone.  Status is: Inpatient  The patient will require care spanning > 2 midnights and should be moved to inpatient because: Inpatient level of care appropriate due to severity of illness  Dispo: The patient is from: Home              Anticipated d/c is to: Home, consulting PT  OT today              Anticipated d/c date is: 1 day              Patient currently is not medically stable to d/c.     Estimated body mass index is 23.71 kg/m as calculated from the following:   Height as of this encounter: 5\' 11"  (1.803 m).   Weight as of this encounter: 77.1 kg.      Nutritional status:             Consultants:   Neurology signed off  Procedures:   None  Antimicrobials:  Anti-infectives (From admission, onward)   None          Subjective: Seen and examined this morning.  Pretty groggy due to receiving Ativan few hours ago.  Easily arousable but incoherent.  Objective: Vitals:   01/21/20 2031 01/21/20 2300 01/22/20 0416 01/22/20 0752  BP: (!) 133/98 137/89 (!) 143/88 (!) 141/92  Pulse: 100  89 83  Resp: 20 18 20 20   Temp: 98.6 F (37 C) 98 F (36.7 C) 98.1 F (36.7 C) (!) 97.5 F (36.4 C)  TempSrc: Oral Axillary Oral Oral  SpO2: 97% 100% 100% 98%  Weight:      Height:        Intake/Output Summary (Last 24 hours) at 01/22/2020 1115 Last data filed at 01/22/2020 [id number] Gross per 24 hour  Intake 547.75 ml  Output --  Net 547.75 ml   Filed Weights   01/19/20 2158  Weight: 77.1 kg    Examination:  General exam: Appears groggy and incoherent Respiratory system: Clear to auscultation. Respiratory effort normal. Cardiovascular system: S1 & S2 heard, RRR. No JVD, murmurs, rubs, gallops or clicks. No pedal edema. Gastrointestinal system: Abdomen is nondistended, soft and nontender. No organomegaly or masses felt. Normal bowel sounds heard. Central nervous system: Groggy and not oriented. No focal neurological deficits. Extremities: Symmetric 5 x 5 power. Skin: No rashes, lesions or ulcers.   Data Reviewed: I have personally reviewed following labs and imaging studies  CBC: Recent Labs  Lab 01/19/20 2216 01/21/20 0428 01/22/20 0147  WBC 6.8 4.0 4.9  NEUTROABS 4.2  --  3.2  HGB 13.8 14.4 14.0  HCT 41.8 43.5 42.3  MCV 96.3 92.4 92.4  PLT 124* 109* 113*   Basic Metabolic Panel: Recent Labs  Lab 01/19/20 2216 01/20/20 0057 01/21/20 0428 01/22/20 0147  NA 137  --  136 136  K 3.7  --  3.5 3.7  CL 100  --  100 102  CO2 16*  --  22 23  GLUCOSE 79  --  73 96  BUN 5*  --  6* 6*  CREATININE 0.87  --  0.85 1.00  CALCIUM 8.7*  --  8.7* 9.4  MG  --  1.8  --  1.7  PHOS  --  2.0*  --   --    GFR: Estimated Creatinine Clearance: 79.5 mL/min (by C-G formula based on SCr of 1  mg/dL). Liver Function Tests: Recent Labs  Lab 01/19/20 2216 01/22/20 0147  AST 82* 121*  ALT 41 42  ALKPHOS 99 83  BILITOT 1.0 1.2  PROT 7.4 6.7  ALBUMIN 3.8 3.2*   No results for input(s): LIPASE, AMYLASE in the last 168 hours. Recent Labs  Lab 01/19/20 2217 01/21/20 1012 01/22/20 0827  AMMONIA 67* 38* 45*   Coagulation Profile: No results for input(s): INR, PROTIME in the last 168  hours. Cardiac Enzymes: No results for input(s): CKTOTAL, CKMB, CKMBINDEX, TROPONINI in the last 168 hours. BNP (last 3 results) No results for input(s): PROBNP in the last 8760 hours. HbA1C: Recent Labs    01/20/20 0402  HGBA1C 5.4   CBG: Recent Labs  Lab 01/19/20 2231  GLUCAP 79   Lipid Profile: Recent Labs    01/20/20 0402  CHOL 219*  HDL 73  LDLCALC 129*  TRIG 85  CHOLHDL 3.0   Thyroid Function Tests: No results for input(s): TSH, T4TOTAL, FREET4, T3FREE, THYROIDAB in the last 72 hours. Anemia Panel: No results for input(s): VITAMINB12, FOLATE, FERRITIN, TIBC, IRON, RETICCTPCT in the last 72 hours. Sepsis Labs: No results for input(s): PROCALCITON, LATICACIDVEN in the last 168 hours.  Recent Results (from the past 240 hour(s))  Respiratory Panel by RT PCR (Flu A&B, Covid) - Nasopharyngeal Swab     Status: None   Collection Time: 01/20/20  2:29 AM   Specimen: Nasopharyngeal Swab  Result Value Ref Range Status   SARS Coronavirus 2 by RT PCR NEGATIVE NEGATIVE Final    Comment: (NOTE) SARS-CoV-2 target nucleic acids are NOT DETECTED.  The SARS-CoV-2 RNA is generally detectable in upper respiratoy specimens during the acute phase of infection. The lowest concentration of SARS-CoV-2 viral copies this assay can detect is 131 copies/mL. A negative result does not preclude SARS-Cov-2 infection and should not be used as the sole basis for treatment or other patient management decisions. A negative result may occur with  improper specimen collection/handling, submission of  specimen other than nasopharyngeal swab, presence of viral mutation(s) within the areas targeted by this assay, and inadequate number of viral copies (<131 copies/mL). A negative result must be combined with clinical observations, patient history, and epidemiological information. The expected result is Negative.  Fact Sheet for Patients:  https://www.moore.com/  Fact Sheet for Healthcare Providers:  https://www.young.biz/  This test is no t yet approved or cleared by the [place] FDA and  has been authorized for detection and/or diagnosis of SARS-CoV-2 by FDA under an Emergency Use Authorization (EUA). This EUA will remain  in effect (meaning this test can be used) for the duration of the COVID-19 declaration under Section 564(b)(1) of the Act, 21 U.S.C. section 360bbb-3(b)(1), unless the authorization is terminated or revoked sooner.     Influenza A by PCR NEGATIVE NEGATIVE Final   Influenza B by PCR NEGATIVE NEGATIVE Final    Comment: (NOTE) The Xpert Xpress SARS-CoV-2/FLU/RSV assay is intended as an aid in  the diagnosis of influenza from Nasopharyngeal swab specimens and  should not be used as a sole basis for treatment. Nasal washings and  aspirates are unacceptable for Xpert Xpress SARS-CoV-2/FLU/RSV  testing.  Fact Sheet for Patients: https://www.moore.com/  Fact Sheet for Healthcare Providers: https://www.young.biz/  This test is not yet approved or cleared by the [place] FDA and  has been authorized for detection and/or diagnosis of SARS-CoV-2 by  FDA under an Emergency Use Authorization (EUA). This EUA will remain  in effect (meaning this test can be used) for the duration of the  Covid-19 declaration under Section 564(b)(1) of the Act, 21  U.S.C. section 360bbb-3(b)(1), unless the authorization is  terminated or revoked. Performed at [facility], [address],  [place], [place] [address]       Radiology Studies: VAS US CAROTID (at [facility] and [facility] only)  Result Date: 01/20/2020 Carotid Arterial Duplex Study Indications:       CVA. Risk Factors:  Hypertension. Limitations        Today's exam was limited due to the patient's inability or                    unwillingness to cooperate, the patient's respiratory                    variation and patient confusion, patient somnolence, patient                    movement. Comparison Study:  No prior studies. Performing Technologist: Chanda Busing RVT  Examination Guidelines: A complete evaluation includes B-mode imaging, spectral Doppler, color Doppler, and power Doppler as needed of all accessible portions of each vessel. Bilateral testing is considered an integral part of a complete examination. Limited examinations for reoccurring indications may be performed as noted.  Right Carotid Findings: +----------+--------+--------+--------+-----------------------+--------+             PSV cm/s EDV cm/s Stenosis Plaque Description      Comments  +----------+--------+--------+--------+-----------------------+--------+  CCA Prox   87       16                smooth and heterogenous           +----------+--------+--------+--------+-----------------------+--------+  CCA Distal 56       11                smooth and heterogenous           +----------+--------+--------+--------+-----------------------+--------+  ICA Prox   34       10                smooth and heterogenous           +----------+--------+--------+--------+-----------------------+--------+  ICA Distal 51       12                                        tortuous  +----------+--------+--------+--------+-----------------------+--------+  ECA        84       10                                                  +----------+--------+--------+--------+-----------------------+--------+ +----------+--------+-------+--------+-------------------+             PSV cm/s EDV cms Describe Arm  Pressure (mmHG)  +----------+--------+-------+--------+-------------------+  Subclavian 73                                             +----------+--------+-------+--------+-------------------+ +---------+--------+--+--------+--+---------+  Vertebral PSV cm/s 24 EDV cm/s 10 Antegrade  +---------+--------+--+--------+--+---------+  Left Carotid Findings: +----------+--------+--------+--------+-----------------------+--------+             PSV cm/s EDV cm/s Stenosis Plaque Description      Comments  +----------+--------+--------+--------+-----------------------+--------+  CCA Prox   90       20                smooth and heterogenous           +----------+--------+--------+--------+-----------------------+--------+  CCA Distal 64       13  smooth and heterogenous           +----------+--------+--------+--------+-----------------------+--------+  ICA Prox   39       12                smooth and heterogenous           +----------+--------+--------+--------+-----------------------+--------+  ICA Distal 52       17                                        tortuous  +----------+--------+--------+--------+-----------------------+--------+  ECA        106      17                                                  +----------+--------+--------+--------+-----------------------+--------+ +----------+--------+--------+--------+-------------------+             PSV cm/s EDV cm/s Describe Arm Pressure (mmHG)  +----------+--------+--------+--------+-------------------+  Subclavian 147                                             +----------+--------+--------+--------+-------------------+ +---------+--------+--+--------+--+---------+  Vertebral PSV cm/s 25 EDV cm/s 10 Antegrade  +---------+--------+--+--------+--+---------+   Summary: Right Carotid: Velocities in the right ICA are consistent with a 1-39% stenosis. Left Carotid: Velocities in the left ICA are consistent with a 1-39% stenosis. Vertebrals: Bilateral vertebral  arteries demonstrate antegrade flow. *See table(s) above for measurements and observations.  Electronically signed by Sherald Hess MD on 01/20/2020 at 2:23:46 PM.    Final    US Abdomen Limited RUQ (LIVER/GB)  Result Date: 01/21/2020 CLINICAL DATA:  Cirrhosis EXAM: ULTRASOUND ABDOMEN LIMITED RIGHT UPPER QUADRANT COMPARISON:  None. FINDINGS: Gallbladder: No gallstones or wall thickening visualized. No sonographic Murphy sign noted by sonographer. Common bile duct: Diameter: Normal caliber, 4 mm Liver: Increased echotexture compatible with fatty infiltration. No focal abnormality or biliary ductal dilatation. Portal vein is patent on color Doppler imaging with normal direction of blood flow towards the liver. Other: None. IMPRESSION: Fatty infiltration of the liver. No acute findings. Electronically Signed   By: Charlett Nose M.D.   On: 01/21/2020 20:01    Scheduled Meds:  amLODipine  10 mg Oral Daily   aspirin EC  81 mg Oral Daily   atorvastatin  80 mg Oral Daily   enoxaparin (LOVENOX) injection  40 mg Subcutaneous Q24H   folic acid  1 mg Oral Daily   levETIRAcetam  500 mg Oral BID   multivitamin with minerals  1 tablet Oral Daily   nicotine  21 mg Transdermal Daily   thiamine  100 mg Oral Daily   Or   thiamine  100 mg Intravenous Daily   Continuous Infusions:    LOS: 1 day   Time spent: 30 minutes   Hughie Closs, MD [facility]  01/22/2020, 11:15 AM   To contact the attending provider between 7A-7P or the covering provider during after hours 7P-7A, please log into the web site [email or web address].

## 2020-01-23 DIAGNOSIS — I10 Essential (primary) hypertension: Secondary | ICD-10-CM | POA: Diagnosis not present

## 2020-01-23 LAB — CBC WITH DIFFERENTIAL/PLATELET
Abs Immature Granulocytes: 0.03 10*3/uL (ref 0.00–0.07)
Basophils Absolute: 0 10*3/uL (ref 0.0–0.1)
Basophils Relative: 1 %
Eosinophils Absolute: 0.1 10*3/uL (ref 0.0–0.5)
Eosinophils Relative: 2 %
HCT: 41.7 % (ref 39.0–52.0)
Hemoglobin: 14 g/dL (ref 13.0–17.0)
Immature Granulocytes: 1 %
Lymphocytes Relative: 22 %
Lymphs Abs: 1 10*3/uL (ref 0.7–4.0)
MCH: 31 pg (ref 26.0–34.0)
MCHC: 33.6 g/dL (ref 30.0–36.0)
MCV: 92.3 fL (ref 80.0–100.0)
Monocytes Absolute: 0.7 10*3/uL (ref 0.1–1.0)
Monocytes Relative: 16 %
Neutro Abs: 2.6 10*3/uL (ref 1.7–7.7)
Neutrophils Relative %: 58 %
Platelets: 126 10*3/uL — ABNORMAL LOW (ref 150–400)
RBC: 4.52 MIL/uL (ref 4.22–5.81)
RDW: 12.9 % (ref 11.5–15.5)
WBC: 4.4 10*3/uL (ref 4.0–10.5)
nRBC: 0 % (ref 0.0–0.2)

## 2020-01-23 LAB — COMPREHENSIVE METABOLIC PANEL
ALT: 91 U/L — ABNORMAL HIGH (ref 0–44)
AST: 227 U/L — ABNORMAL HIGH (ref 15–41)
Albumin: 3.2 g/dL — ABNORMAL LOW (ref 3.5–5.0)
Alkaline Phosphatase: 88 U/L (ref 38–126)
Anion gap: 10 (ref 5–15)
BUN: 6 mg/dL — ABNORMAL LOW (ref 8–23)
CO2: 22 mmol/L (ref 22–32)
Calcium: 9.6 mg/dL (ref 8.9–10.3)
Chloride: 104 mmol/L (ref 98–111)
Creatinine, Ser: 0.93 mg/dL (ref 0.61–1.24)
GFR, Estimated: >60 mL/min (ref >60)
Glucose, Bld: 88 mg/dL (ref 70–99)
Potassium: 3.8 mmol/L (ref 3.5–5.1)
Sodium: 136 mmol/L (ref 135–145)
Total Bilirubin: 1.1 mg/dL (ref 0.3–1.2)
Total Protein: 6.8 g/dL (ref 6.5–8.1)

## 2020-01-23 LAB — MAGNESIUM: Magnesium: 2 mg/dL (ref 1.7–2.4)

## 2020-01-23 MED ORDER — CHLORDIAZEPOXIDE HCL 25 MG PO CAPS
25.0000 mg | ORAL_CAPSULE | Freq: Two times a day (BID) | ORAL | Status: DC
  Administered 2020-01-23 – 2020-01-24 (×2): 25 mg via ORAL
  Filled 2020-01-23 (×2): qty 1

## 2020-01-23 MED ORDER — LORAZEPAM 2 MG/ML IJ SOLN
1.0000 mg | INTRAMUSCULAR | Status: AC | PRN
  Administered 2020-01-24 – 2020-01-25 (×2): 1 mg via INTRAVENOUS
  Filled 2020-01-23 (×2): qty 1

## 2020-01-23 MED ORDER — LORAZEPAM 1 MG PO TABS
1.0000 mg | ORAL_TABLET | ORAL | Status: AC | PRN
  Administered 2020-01-23: 2 mg via ORAL
  Filled 2020-01-23: qty 2

## 2020-01-23 NOTE — Progress Notes (Signed)
PROGRESS NOTE    Dominic Carter  EVO:[id number] DOB: January 29, 1956 DOA: 01/19/2020 PCP: Patient, No Pcp Per   Brief Narrative:  Dominic Carter is a 64 y.o. 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  Pt reportedly standing in doorway at home, trembling all over, and unable to speak. EMS called.  Pt AAOx2 with EMS and post ictal on arrival to ED.  Pt apparently drinks EtOH daily. 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  This is new since 2017 (when he last had a CT head on file). Pt tremulous in ED, ultimately ended up having another tonic-clonic seizure around [place], this was terminated with Ativan and loaded by Keppra given by EDP.  Patient was admitted under hospitalist service.  Neurology was consulted. EEG suggestive of cortical dysfunction in right temporal region consistent with underlying stroke but no epileptiform discharges.  CT and MRI brain negative for acute stroke but confirmed old stroke.  He was started on Keppra 500 mg twice daily.  On the morning of 01/21/2020, patient was alert and oriented x2.  Subsequently on the night of 01/21/2020, patient started having alcohol withdrawal syndrome with CIWA jumping to as high as 20.  He received several doses of Ativan.  Assessment & Plan:   Principal Problem:   Seizure (HCC) Active Problems:   ETOH abuse   HTN (hypertension)   Chronic ischemic right MCA stroke   Seizures (HCC)  Seizure: EEG suggestive of cortical dysfunction in right temporal region consistent with underlying stroke but no epileptiform discharges.  CT and MRI brain negative for acute stroke.  No more seizures since admission.  Was slightly slow this morning and oriented only x2.  Neurology signed off.  We will continue Keppra 500 mg p.o. twice daily per their recommendations.  EtOH abuse/acute alcohol withdrawal: Alcohol level 46 upon presentation.  Once again,  he has required couple of doses of Ativan in last 24 hours while being on Librium.  He is sleepy but easily arousable.  More alert but still confused.  We will reduce his Librium frequency to twice daily and continue CIWA protocol with as needed Ativan.   Essential hypertension: Controlled.  Continue home dose of amlodipine.  Hyperammonemia: Ammonia 67 upon presentation >38>45.  Ultrasound abdomen negative for any cirrhosis.  Acute viral hepatitis panel negative as well.  History of stroke: MRI shows remote posterior right MCA and remote lacunar infarcts of the cerebellum bilaterally.  Patient not aware of any history of a stroke.  Continue aspirin.  Hemoglobin A1c 5.4.  Hyperlipidemia: Cholesterol 219 and LDL 129.  Continue atorvastatin 80 mg and discharged on this.  DVT prophylaxis: enoxaparin (LOVENOX) injection 40 mg Start: 01/20/20 1000   Code Status: Full Code  Family Communication:  None present at bedside.  Plan of care discussed with his daughter Loretha Stapler over the phone yesterday.  Status is: Inpatient  The patient will require care spanning > 2 midnights and should be moved to inpatient because: Inpatient level of care appropriate due to severity of illness  Dispo: The patient is from: Home              Anticipated d/c is to: SNF              Anticipated d/c date is: 1 to 2 days.  Patient currently is not medically stable to d/c.     Estimated body mass index is 23.71 kg/m as calculated from the following:   Height as of this encounter: 5\' 11"  (1.803 m).   Weight as of this encounter: 77.1 kg.      Nutritional status:             Consultants:   Neurology signed off  Procedures:   None  Antimicrobials:  Anti-infectives (From admission, onward)   None         Subjective: Seen and examined.  Sleepy but easily arousable.  More oriented than yesterday but still not fully oriented.  Objective: Vitals:   01/22/20 2041 01/23/20 0000  01/23/20 0355 01/23/20 0818  BP: (!) 156/100 132/87 119/84 119/81  Pulse: 99 89 93 93  Resp: 18 18 18 18   Temp: 97.8 F (36.6 C) 98.7 F (37.1 C) 97.6 F (36.4 C) 97.7 F (36.5 C)  TempSrc: Oral Oral Oral Oral  SpO2: 100% 100% 99% 99%  Weight:      Height:        Intake/Output Summary (Last 24 hours) at 01/23/2020 1149 Last data filed at 01/23/2020 0900 Gross per 24 hour  Intake 940 ml  Output 300 ml  Net 640 ml   Filed Weights   01/19/20 2158  Weight: 77.1 kg    Examination:  General exam: Appears sleepy. Respiratory system: Clear to auscultation. Respiratory effort normal. Cardiovascular system: S1 & S2 heard, RRR. No JVD, murmurs, rubs, gallops or clicks. No pedal edema. Gastrointestinal system: Abdomen is nondistended, soft and nontender. No organomegaly or masses felt. Normal bowel sounds heard. Central nervous system: Alert and oriented x2. No focal neurological deficits. Extremities: Symmetric 5 x 5 power. Skin: No rashes, lesions or ulcers.    Data Reviewed: I have personally reviewed following labs and imaging studies  CBC: Recent Labs  Lab 01/19/20 2216 01/21/20 0428 01/22/20 0147 01/23/20 0120  WBC 6.8 4.0 4.9 4.4  NEUTROABS 4.2  --  3.2 2.6  HGB 13.8 14.4 14.0 14.0  HCT 41.8 43.5 42.3 41.7  MCV 96.3 92.4 92.4 92.3  PLT 124* 109* 113* 126*   Basic Metabolic Panel: Recent Labs  Lab 01/19/20 2216 01/20/20 0057 01/21/20 0428 01/22/20 0147 01/23/20 0120  NA 137  --  136 136 136  K 3.7  --  3.5 3.7 3.8  CL 100  --  100 102 104  CO2 16*  --  22 23 22   GLUCOSE 79  --  73 96 88  BUN 5*  --  6* 6* 6*  CREATININE 0.87  --  0.85 1.00 0.93  CALCIUM 8.7*  --  8.7* 9.4 9.6  MG  --  1.8  --  1.7 2.0  PHOS  --  2.0*  --   --   --    GFR: Estimated Creatinine Clearance: 85.5 mL/min (by C-G formula based on SCr of 0.93 mg/dL). Liver Function Tests: Recent Labs  Lab 01/19/20 2216 01/22/20 0147 01/23/20 0120  AST 82* 121* 227*  ALT 41 42 91*    ALKPHOS 99 83 88  BILITOT 1.0 1.2 1.1  PROT 7.4 6.7 6.8  ALBUMIN 3.8 3.2* 3.2*   No results for input(s): LIPASE, AMYLASE in the last 168 hours. Recent Labs  Lab 01/19/20 2217 01/21/20 1012 01/22/20 0827  AMMONIA 67* 38* 45*   Coagulation Profile: No results for input(s): INR, PROTIME in the last 168 hours. Cardiac Enzymes: No results for input(s): CKTOTAL,  CKMB, CKMBINDEX, TROPONINI in the last 168 hours. BNP (last 3 results) No results for input(s): PROBNP in the last 8760 hours. HbA1C: No results for input(s): HGBA1C in the last 72 hours. CBG: Recent Labs  Lab 01/19/20 2231  GLUCAP 79   Lipid Profile: No results for input(s): CHOL, HDL, LDLCALC, TRIG, CHOLHDL, LDLDIRECT in the last 72 hours. Thyroid Function Tests: No results for input(s): TSH, T4TOTAL, FREET4, T3FREE, THYROIDAB in the last 72 hours. Anemia Panel: No results for input(s): VITAMINB12, FOLATE, FERRITIN, TIBC, IRON, RETICCTPCT in the last 72 hours. Sepsis Labs: No results for input(s): PROCALCITON, LATICACIDVEN in the last 168 hours.  Recent Results (from the past 240 hour(s))  Respiratory Panel by RT PCR (Flu A&B, Covid) - Nasopharyngeal Swab     Status: None   Collection Time: 01/20/20  2:29 AM   Specimen: Nasopharyngeal Swab  Result Value Ref Range Status   SARS Coronavirus 2 by RT PCR NEGATIVE NEGATIVE Final    Comment: (NOTE) SARS-CoV-2 target nucleic acids are NOT DETECTED.  The SARS-CoV-2 RNA is generally detectable in upper respiratoy specimens during the acute phase of infection. The lowest concentration of SARS-CoV-2 viral copies this assay can detect is 131 copies/mL. A negative result does not preclude SARS-Cov-2 infection and should not be used as the sole basis for treatment or other patient management decisions. A negative result may occur with  improper specimen collection/handling, submission of specimen other than nasopharyngeal swab, presence of viral mutation(s) within  the areas targeted by this assay, and inadequate number of viral copies (<131 copies/mL). A negative result must be combined with clinical observations, patient history, and epidemiological information. The expected result is Negative.  Fact Sheet for Patients:  https://www.moore.com/https://www.fda.gov/media/142436/download  Fact Sheet for Healthcare Providers:  https://www.young.biz/https://www.fda.gov/media/142435/download  This test is no t yet approved or cleared by the [place] FDA and  has been authorized for detection and/or diagnosis of SARS-CoV-2 by FDA under an Emergency Use Authorization (EUA). This EUA will remain  in effect (meaning this test can be used) for the duration of the COVID-19 declaration under Section 564(b)(1) of the Act, 21 U.S.C. section 360bbb-3(b)(1), unless the authorization is terminated or revoked sooner.     Influenza A by PCR NEGATIVE NEGATIVE Final   Influenza B by PCR NEGATIVE NEGATIVE Final    Comment: (NOTE) The Xpert Xpress SARS-CoV-2/FLU/RSV assay is intended as an aid in  the diagnosis of influenza from Nasopharyngeal swab specimens and  should not be used as a sole basis for treatment. Nasal washings and  aspirates are unacceptable for Xpert Xpress SARS-CoV-2/FLU/RSV  testing.  Fact Sheet for Patients: https://www.moore.com/https://www.fda.gov/media/142436/download  Fact Sheet for Healthcare Providers: https://www.young.biz/https://www.fda.gov/media/142435/download  This test is not yet approved or cleared by the [place] FDA and  has been authorized for detection and/or diagnosis of SARS-CoV-2 by  FDA under an Emergency Use Authorization (EUA). This EUA will remain  in effect (meaning this test can be used) for the duration of the  Covid-19 declaration under Section 564(b)(1) of the Act, 21  U.S.C. section 360bbb-3(b)(1), unless the authorization is  terminated or revoked. Performed at [facility], [address] [place], [place] [address]       Radiology Studies: US Abdomen Limited RUQ  (LIVER/GB)  Result Date: 01/21/2020 CLINICAL DATA:  Cirrhosis EXAM: ULTRASOUND ABDOMEN LIMITED RIGHT UPPER QUADRANT COMPARISON:  None. FINDINGS: Gallbladder: No gallstones or wall thickening visualized. No sonographic Murphy sign noted by sonographer. Common bile duct: Diameter: Normal caliber, 4 mm  Liver: Increased echotexture compatible with fatty infiltration. No focal abnormality or biliary ductal dilatation. Portal vein is patent on color Doppler imaging with normal direction of blood flow towards the liver. Other: None. IMPRESSION: Fatty infiltration of the liver. No acute findings. Electronically Signed   By: Charlett Nose M.D.   On: 01/21/2020 20:01    Scheduled Meds: . amLODipine  10 mg Oral Daily  . aspirin EC  81 mg Oral Daily  . atorvastatin  80 mg Oral Daily  . chlordiazePOXIDE  25 mg Oral BID  . enoxaparin (LOVENOX) injection  40 mg Subcutaneous Q24H  . folic acid  1 mg Oral Daily  . levETIRAcetam  500 mg Oral BID  . multivitamin with minerals  1 tablet Oral Daily  . nicotine  21 mg Transdermal Daily  . thiamine  100 mg Oral Daily   Or  . thiamine  100 mg Intravenous Daily   Continuous Infusions:    LOS: 2 days   Time spent: 28 minutes   Hughie Closs, MD [facility]  01/23/2020, 11:49 AM   To contact the attending provider between 7A-7P or the covering provider during after hours 7P-7A, please log into the web site [email or web address].

## 2020-01-23 NOTE — Progress Notes (Addendum)
Patient is redirectable and followed commands without medication intervention.Reassessment 2200 completed.

## 2020-01-23 NOTE — Progress Notes (Signed)
Occupational Therapy Treatment Patient Details Name: Dominic Carter MRN: [id number] DOB: 04-11-1955 Today's Date: 01/23/2020    History of present illness Patient is a 64 y/o male who presents with confusion and new onset seizures. 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 daily ETOH use    OT comments  Pt Requires increased assist today for bed mobility due to lethargy and confusion.  He is now on CIWA protoccol and has received Ativan which likely is contributing.  He required mod A for bed mobility, and was unable to stand due to fatigue.  He requires min - total A for ADLs, and is not oriented today.  Continue to recommend SNF level rehab.   Follow Up Recommendations  SNF;Supervision/Assistance - 24 hour    Equipment Recommendations  Wheelchair (measurements OT);Wheelchair cushion (measurements OT)    Recommendations for Other Services      Precautions / Restrictions Precautions Precautions: Fall Precaution Comments: seizure precautions       Mobility Bed Mobility Overal bed mobility: Needs Assistance Bed Mobility: Supine to Sit;Sit to Supine     Supine to sit: Mod assist Sit to supine: Mod assist   General bed mobility comments: Pt demonstrated difficulty moving into sitting position with frequent loss of balance posteriorly.  Multiple attempts and mod A to move to EOB.  He required assist to lie down with head at the head of the bed (vs. foot of bed), and assist to lift LE into the bed   Transfers                 General transfer comment: Pt refused attempts to stand citing fatigue and need to take a nap     Balance Overall balance assessment: Needs assistance Sitting-balance support: Feet supported;Single extremity supported;Bilateral upper extremity supported Sitting balance-Leahy Scale: Poor Sitting balance - Comments: Requires min guard to min A                                    ADL either performed or assessed with clinical judgement   ADL       Grooming: Wash/dry hands;Wash/dry face;Minimal assistance;Sitting                                       Vision   Additional Comments: Pt was able to read the clock on the wall once assist provided to locate it    Perception     Praxis      Cognition Arousal/Alertness: Awake/alert;Lethargic Behavior During Therapy: Flat affect;Impulsive Overall Cognitive Status: Impaired/Different from baseline Area of Impairment: Attention;Memory;Safety/judgement;Following commands;Awareness;Problem solving;Orientation                 Orientation Level: Disoriented to;Time;Situation Current Attention Level: Sustained;Focused Memory: Decreased short-term memory Following Commands: Follows one step commands inconsistently;Follows one step commands with increased time Safety/Judgement: Decreased awareness of deficits;Decreased awareness of safety   Problem Solving: Slow processing;Decreased initiation;Difficulty sequencing;Requires verbal cues;Requires tactile cues General Comments: Pt disoriented today - thinks he is at home, but later in session recalls he at [facility].  He is slow to respond and intiate tasks         Exercises     Shoulder Instructions       General Comments      Pertinent Vitals/ Pain  Pain Assessment: No/denies pain  Home Living                                          Prior Functioning/Environment              Frequency  Min 2X/week        Progress Toward Goals  OT Goals(current goals can now be found in the care plan section)  Progress towards OT goals: Not progressing toward goals - comment (lethargy )     Plan Discharge plan remains appropriate    Co-evaluation                 AM-PAC OT "6 Clicks" Daily Activity     Outcome Measure   Help from another person eating meals?: A Little Help from another person taking  care of personal grooming?: A Little Help from another person toileting, which includes using toliet, bedpan, or urinal?: A Lot Help from another person bathing (including washing, rinsing, drying)?: A Lot Help from another person to put on and taking off regular upper body clothing?: A Lot Help from another person to put on and taking off regular lower body clothing?: A Lot 6 Click Score: 14    End of Session    OT Visit Diagnosis: Unsteadiness on feet (R26.81);Cognitive communication deficit (R41.841)   Activity Tolerance Patient limited by lethargy   Patient Left in bed;with call bell/phone within reach;with bed alarm set;with nursing/sitter in room   Nurse Communication Mobility status        Time: 7408-1448 OT Time Calculation (min): 21 min  Charges: OT General Charges $OT Visit: 1 Visit OT Treatments $Therapeutic Activity: 8-22 mins  Eber Jones., OTR/L Acute Rehabilitation Services Pager [phone] Office [phone]    Jeani Hawking M 01/23/2020, 9:30 AM

## 2020-01-23 NOTE — Progress Notes (Addendum)
PT Cancellation Note  Patient Details Name: Dominic Carter MRN: [id number] DOB: [birth year]   Cancelled Treatment:    Reason Eval/Treat Not Completed: Other (comment) Pt now on CIWA protocol.  RN reports pt required Ativan earlier and now is lethargic.  Will f/u as able when pt better able to participate. Checked back later and pt eating. Will f/u as able.  Anise Salvo, PT Acute Rehab Services Pager [phone] [facility] [phone]     Rayetta Humphrey 01/23/2020, 4:34 PM

## 2020-01-24 DIAGNOSIS — I10 Essential (primary) hypertension: Secondary | ICD-10-CM | POA: Diagnosis not present

## 2020-01-24 LAB — CBC WITH DIFFERENTIAL/PLATELET
Abs Immature Granulocytes: 0.02 10*3/uL (ref 0.00–0.07)
Basophils Absolute: 0.1 10*3/uL (ref 0.0–0.1)
Basophils Relative: 1 %
Eosinophils Absolute: 0.2 10*3/uL (ref 0.0–0.5)
Eosinophils Relative: 3 %
HCT: 41.1 % (ref 39.0–52.0)
Hemoglobin: 13.8 g/dL (ref 13.0–17.0)
Immature Granulocytes: 0 %
Lymphocytes Relative: 22 %
Lymphs Abs: 1.1 10*3/uL (ref 0.7–4.0)
MCH: 30.9 pg (ref 26.0–34.0)
MCHC: 33.6 g/dL (ref 30.0–36.0)
MCV: 92.2 fL (ref 80.0–100.0)
Monocytes Absolute: 0.7 10*3/uL (ref 0.1–1.0)
Monocytes Relative: 15 %
Neutro Abs: 2.8 10*3/uL (ref 1.7–7.7)
Neutrophils Relative %: 59 %
Platelets: 130 10*3/uL — ABNORMAL LOW (ref 150–400)
RBC: 4.46 MIL/uL (ref 4.22–5.81)
RDW: 12.8 % (ref 11.5–15.5)
WBC: 4.8 10*3/uL (ref 4.0–10.5)
nRBC: 0 % (ref 0.0–0.2)

## 2020-01-24 LAB — COMPREHENSIVE METABOLIC PANEL
ALT: 200 U/L — ABNORMAL HIGH (ref 0–44)
AST: 355 U/L — ABNORMAL HIGH (ref 15–41)
Albumin: 3.2 g/dL — ABNORMAL LOW (ref 3.5–5.0)
Alkaline Phosphatase: 75 U/L (ref 38–126)
Anion gap: 11 (ref 5–15)
BUN: 7 mg/dL — ABNORMAL LOW (ref 8–23)
CO2: 22 mmol/L (ref 22–32)
Calcium: 9.2 mg/dL (ref 8.9–10.3)
Chloride: 103 mmol/L (ref 98–111)
Creatinine, Ser: 0.95 mg/dL (ref 0.61–1.24)
GFR, Estimated: >60 mL/min (ref >60)
Glucose, Bld: 97 mg/dL (ref 70–99)
Potassium: 4.1 mmol/L (ref 3.5–5.1)
Sodium: 136 mmol/L (ref 135–145)
Total Bilirubin: 1.1 mg/dL (ref 0.3–1.2)
Total Protein: 6.6 g/dL (ref 6.5–8.1)

## 2020-01-24 LAB — AMMONIA: Ammonia: 52 umol/L — ABNORMAL HIGH (ref 9–35)

## 2020-01-24 MED ORDER — CHLORDIAZEPOXIDE HCL 5 MG PO CAPS
10.0000 mg | ORAL_CAPSULE | Freq: Three times a day (TID) | ORAL | Status: DC
  Administered 2020-01-24 – 2020-01-26 (×6): 10 mg via ORAL
  Filled 2020-01-24 (×6): qty 2

## 2020-01-24 NOTE — Progress Notes (Signed)
PROGRESS NOTE    Dominic Carter  QIW:[id number] DOB: 05-19-1955 DOA: 01/19/2020 PCP: Patient, No Pcp Per   Brief Narrative:  Dominic Carter is a 64 y.o. 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  Pt reportedly standing in doorway at home, trembling all over, and unable to speak. EMS called.  Pt AAOx2 with EMS and post ictal on arrival to ED.  Pt apparently drinks EtOH daily. 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  This is new since 2017 (when he last had a CT head on file). Pt tremulous in ED, ultimately ended up having another tonic-clonic seizure around [place], this was terminated with Ativan and loaded by Keppra given by EDP.  Patient was admitted under hospitalist service.  Neurology was consulted. EEG suggestive of cortical dysfunction in right temporal region consistent with underlying stroke but no epileptiform discharges.  CT and MRI brain negative for acute stroke but confirmed old stroke.  He was started on Keppra 500 mg twice daily.  On the morning of 01/21/2020, patient was alert and oriented x2.  Subsequently on the night of 01/21/2020, patient started having alcohol withdrawal syndrome with CIWA jumping to as high as 20.  He received several doses of Ativan.  Assessment & Plan:   Principal Problem:   Seizure (HCC) Active Problems:   ETOH abuse   HTN (hypertension)   Chronic ischemic right MCA stroke   Seizures (HCC)  Seizure: EEG suggestive of cortical dysfunction in right temporal region consistent with underlying stroke but no epileptiform discharges.  CT and MRI brain negative for acute stroke.  No more seizures since admission.  Was slightly slow this morning and oriented only x2.  Neurology signed off.  We will continue Keppra 500 mg p.o. twice daily per their recommendations.  EtOH abuse/acute alcohol withdrawal: Alcohol level 46 upon presentation.  Once again,  he has required couple of doses of Ativan in last 24 hours while being on Librium.  He is once again sleepy but easily arousable and oriented to when prompted.  I will reduce his Librium.  Currently on 25 mg twice daily.  Will reduce to 10 mg 3 times daily.  Continue CIWA protocol with as needed Ativan.  Essential hypertension: Controlled.  Continue home dose of amlodipine.  Hyperammonemia/elevated LFTs/acute alcoholic hepatitis: Ammonia 67 upon presentation >38>45.  Ultrasound abdomen negative for any cirrhosis but positive for fatty infiltration.  Acute viral hepatitis panel negative as well.  LFTs jumped significantly.  AST significantly elevated than ALT indicating alcoholic hepatitis.  Checking ammonia level today.  Will repeat LFTs tomorrow, if more jump, consider GI consultation.  Patient may need treatment with steroids in that case.  Hold atorvastatin for now.  History of stroke: MRI shows remote posterior right MCA and remote lacunar infarcts of the cerebellum bilaterally.  Patient not aware of any history of a stroke.  Continue aspirin.  Hemoglobin A1c 5.4.  Hyperlipidemia: Cholesterol 219 and LDL 129.  Discontinue atorvastatin in the face of rising LFTs.  DVT prophylaxis: enoxaparin (LOVENOX) injection 40 mg Start: 01/20/20 1000   Code Status: Full Code  Family Communication:  None present at bedside.  Plan of care discussed with his daughter Loretha Stapler over the phone yesterday.  Status is: Inpatient  The patient will require care spanning > 2 midnights and should be moved to inpatient because: Inpatient level of care appropriate  due to severity of illness  Dispo: The patient is from: Home              Anticipated d/c is to: SNF              Anticipated d/c date is: 1 to 2 days.              Patient currently is not medically stable to d/c.     Estimated body mass index is 23.71 kg/m as calculated from the following:   Height as of this encounter: 5\' 11"  (1.803 m).   Weight as  of this encounter: 77.1 kg.      Nutritional status:             Consultants:   Neurology signed off  Procedures:   None  Antimicrobials:  Anti-infectives (From admission, onward)   None         Subjective: Seen and examined.  Sleepy but easily arousable.  Oriented when prompted.  Denies any complaint.  Objective: Vitals:   01/24/20 0000 01/24/20 0353 01/24/20 0600 01/24/20 0820  BP: 129/85 103/86 132/74 125/84  Pulse: 86 84 88 88  Resp: 18 16  18   Temp: 97.6 F (36.4 C) 97.9 F (36.6 C)  97.6 F (36.4 C)  TempSrc: Oral Oral  Oral  SpO2: 96% 100%  99%  Weight:      Height:        Intake/Output Summary (Last 24 hours) at 01/24/2020 1126 Last data filed at 01/24/2020 0755 Gross per 24 hour  Intake 920 ml  Output 400 ml  Net 520 ml   Filed Weights   01/19/20 2158  Weight: 77.1 kg    Examination:  General exam: Appears calm and comfortable but sleepy. Respiratory system: Clear to auscultation. Respiratory effort normal. Cardiovascular system: S1 & S2 heard, RRR. No JVD, murmurs, rubs, gallops or clicks. No pedal edema. Gastrointestinal system: Abdomen is nondistended, soft and nontender. No organomegaly or masses felt. Normal bowel sounds heard. Central nervous system: Alert and oriented x3. No focal neurological deficits. Extremities: Symmetric 5 x 5 power. Skin: No rashes, lesions or ulcers.   Data Reviewed: I have personally reviewed following labs and imaging studies  CBC: Recent Labs  Lab 01/19/20 2216 01/21/20 0428 01/22/20 0147 01/23/20 0120 01/24/20 0410  WBC 6.8 4.0 4.9 4.4 4.8  NEUTROABS 4.2  --  3.2 2.6 2.8  HGB 13.8 14.4 14.0 14.0 13.8  HCT 41.8 43.5 42.3 41.7 41.1  MCV 96.3 92.4 92.4 92.3 92.2  PLT 124* 109* 113* 126* 130*   Basic Metabolic Panel: Recent Labs  Lab 01/19/20 2216 01/20/20 0057 01/21/20 0428 01/22/20 0147 01/23/20 0120 01/24/20 0410  NA 137  --  136 136 136 136  K 3.7  --  3.5 3.7 3.8 4.1  CL  100  --  100 102 104 103  CO2 16*  --  22 23 22 22   GLUCOSE 79  --  73 96 88 97  BUN 5*  --  6* 6* 6* 7*  CREATININE 0.87  --  0.85 1.00 0.93 0.95  CALCIUM 8.7*  --  8.7* 9.4 9.6 9.2  MG  --  1.8  --  1.7 2.0  --   PHOS  --  2.0*  --   --   --   --    GFR: Estimated Creatinine Clearance: 83.7 mL/min (by C-G formula based on SCr of 0.95 mg/dL). Liver Function Tests: Recent Labs  Lab 01/19/20 2216  01/22/20 0147 01/23/20 0120 01/24/20 0410  AST 82* 121* 227* 355*  ALT 41 42 91* 200*  ALKPHOS 99 83 88 75  BILITOT 1.0 1.2 1.1 1.1  PROT 7.4 6.7 6.8 6.6  ALBUMIN 3.8 3.2* 3.2* 3.2*   No results for input(s): LIPASE, AMYLASE in the last 168 hours. Recent Labs  Lab 01/19/20 2217 01/21/20 1012 01/22/20 0827  AMMONIA 67* 38* 45*   Coagulation Profile: No results for input(s): INR, PROTIME in the last 168 hours. Cardiac Enzymes: No results for input(s): CKTOTAL, CKMB, CKMBINDEX, TROPONINI in the last 168 hours. BNP (last 3 results) No results for input(s): PROBNP in the last 8760 hours. HbA1C: No results for input(s): HGBA1C in the last 72 hours. CBG: Recent Labs  Lab 01/19/20 2231  GLUCAP 79   Lipid Profile: No results for input(s): CHOL, HDL, LDLCALC, TRIG, CHOLHDL, LDLDIRECT in the last 72 hours. Thyroid Function Tests: No results for input(s): TSH, T4TOTAL, FREET4, T3FREE, THYROIDAB in the last 72 hours. Anemia Panel: No results for input(s): VITAMINB12, FOLATE, FERRITIN, TIBC, IRON, RETICCTPCT in the last 72 hours. Sepsis Labs: No results for input(s): PROCALCITON, LATICACIDVEN in the last 168 hours.  Recent Results (from the past 240 hour(s))  Respiratory Panel by RT PCR (Flu A&B, Covid) - Nasopharyngeal Swab     Status: None   Collection Time: 01/20/20  2:29 AM   Specimen: Nasopharyngeal Swab  Result Value Ref Range Status   SARS Coronavirus 2 by RT PCR NEGATIVE NEGATIVE Final    Comment: (NOTE) SARS-CoV-2 target nucleic acids are NOT DETECTED.  The  SARS-CoV-2 RNA is generally detectable in upper respiratoy specimens during the acute phase of infection. The lowest concentration of SARS-CoV-2 viral copies this assay can detect is 131 copies/mL. A negative result does not preclude SARS-Cov-2 infection and should not be used as the sole basis for treatment or other patient management decisions. A negative result may occur with  improper specimen collection/handling, submission of specimen other than nasopharyngeal swab, presence of viral mutation(s) within the areas targeted by this assay, and inadequate number of viral copies (<131 copies/mL). A negative result must be combined with clinical observations, patient history, and epidemiological information. The expected result is Negative.  Fact Sheet for Patients:  https://www.moore.com/  Fact Sheet for Healthcare Providers:  https://www.young.biz/  This test is no t yet approved or cleared by the [place] FDA and  has been authorized for detection and/or diagnosis of SARS-CoV-2 by FDA under an Emergency Use Authorization (EUA). This EUA will remain  in effect (meaning this test can be used) for the duration of the COVID-19 declaration under Section 564(b)(1) of the Act, 21 U.S.C. section 360bbb-3(b)(1), unless the authorization is terminated or revoked sooner.     Influenza A by PCR NEGATIVE NEGATIVE Final   Influenza B by PCR NEGATIVE NEGATIVE Final    Comment: (NOTE) The Xpert Xpress SARS-CoV-2/FLU/RSV assay is intended as an aid in  the diagnosis of influenza from Nasopharyngeal swab specimens and  should not be used as a sole basis for treatment. Nasal washings and  aspirates are unacceptable for Xpert Xpress SARS-CoV-2/FLU/RSV  testing.  Fact Sheet for Patients: https://www.moore.com/  Fact Sheet for Healthcare Providers: https://www.young.biz/  This test is not yet approved or cleared  by the [place] FDA and  has been authorized for detection and/or diagnosis of SARS-CoV-2 by  FDA under an Emergency Use Authorization (EUA). This EUA will remain  in effect (meaning this test can be used) for the  duration of the  Covid-19 declaration under Section 564(b)(1) of the Act, 21  U.S.C. section 360bbb-3(b)(1), unless the authorization is  terminated or revoked. Performed at [facility], [address] [place], [place] [address]       Radiology Studies: No results found.  Scheduled Meds: . amLODipine  10 mg Oral Daily  . aspirin EC  81 mg Oral Daily  . atorvastatin  80 mg Oral Daily  . chlordiazePOXIDE  25 mg Oral BID  . enoxaparin (LOVENOX) injection  40 mg Subcutaneous Q24H  . folic acid  1 mg Oral Daily  . levETIRAcetam  500 mg Oral BID  . multivitamin with minerals  1 tablet Oral Daily  . nicotine  21 mg Transdermal Daily  . thiamine  100 mg Oral Daily   Or  . thiamine  100 mg Intravenous Daily   Continuous Infusions:    LOS: 3 days   Time spent: 28 minutes   Hughie Clossavi Hisae Decoursey, MD [facility]  01/24/2020, 11:26 AM   To contact the attending provider between 7A-7P or the covering provider during after hours 7P-7A, please log into the web site [email or web address].

## 2020-01-24 NOTE — Progress Notes (Signed)
Physical Therapy Treatment Patient Details Name: Dominic Carter MRN: [id number] DOB: [birth year] Today's Date: 01/24/2020    History of Present Illness Patient is a 64 y/o male who presents with confusion and new onset seizures. 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 daily ETOH use now on CIWA protocol.    PT Comments    Pt demonstrating increased activity level compared to eval but with decreased safety and increased assist.  Noted that pt with ETOH withdrawal symptoms since eval and CIWA protocol initiated - likely the cause of change in mobility since evaluation.  Pt with decreased safety and insight into deficits.  He ambulated with min-mod A 19' but fatigued requiring nursing staff to get chair for pt to sit.  Required frequent cues for safety and transfer technique.  Updated d/c recommendation to SNF at this time due to increased assist and decreased safety.     Follow Up Recommendations  SNF     Equipment Recommendations  Other (comment);Wheelchair cushion (measurements PT);Wheelchair (measurements PT) (pending progress with prosthetic)    Recommendations for Other Services       Precautions / Restrictions Precautions Precautions: Fall Precaution Comments: seizure precautions    Mobility  Bed Mobility Overal bed mobility: Needs Assistance Bed Mobility: Supine to Sit;Sit to Supine     Supine to sit: Min guard Sit to supine: Min guard      Transfers Overall transfer level: Needs assistance Equipment used: Rolling walker (2 wheeled) Transfers: Lateral/Scoot Transfers;Sit to/from Stand Sit to Stand: Min assist        Lateral/Scoot Transfers: Min assist General transfer comment: Sit to stand x 5 throughout session with cues for use of RW (at times attempting squat pivot but not close enough and needing to use RW), upright posture, and safe hand placement  Ambulation/Gait Ambulation/Gait  assistance: Min assist;Mod assist Gait Distance (Feet): 65 Feet (65' then 30') Assistive device: Rolling walker (2 wheeled)   Gait velocity: decreased   General Gait Details: "hop to" pattern as pt does not have prosthetic; decreased insight into his endurance - attempted to have him turn earlier but stated he was fine, but as fatigued pushing walker too far forward and decreased ability to clear foot, nurses provided chair for rest break.  Able to ambulate 30' more after rest break.  Required min A except when fatigued then mod A   Stairs             Wheelchair Mobility    Modified Rankin (Stroke Patients Only) Modified Rankin (Stroke Patients Only) Pre-Morbid Rankin Score: Slight disability Modified Rankin: Moderately severe disability     Balance Overall balance assessment: Needs assistance Sitting-balance support: Feet supported Sitting balance-Leahy Scale: Fair Sitting balance - Comments: able to sit EOB with close guarding   Standing balance support: During functional activity Standing balance-Leahy Scale: Poor Standing balance comment: required RW and assist                            Cognition Arousal/Alertness: Awake/alert Behavior During Therapy: Impulsive Overall Cognitive Status: Impaired/Different from baseline Area of Impairment: Attention;Memory;Safety/judgement;Following commands;Awareness;Problem solving;Orientation                 Orientation Level: Disoriented to;Time;Situation   Memory: Decreased short-term memory Following Commands: Follows one step commands inconsistently;Follows one step commands with increased time Safety/Judgement: Decreased awareness of deficits;Decreased awareness of safety Awareness: Intellectual Problem Solving: Slow  processing;Decreased initiation;Difficulty sequencing;Requires verbal cues;Requires tactile cues General Comments: Pt with decreased awareness of safety and deficits; increased cues for  safety with transfers      Exercises      General Comments        Pertinent Vitals/Pain Pain Assessment: No/denies pain    Home Living                      Prior Function            PT Goals (current goals can now be found in the care plan section) Acute Rehab PT Goals Patient Stated Goal: did not state  PT Goal Formulation: With patient Time For Goal Achievement: 02/03/20 Potential to Achieve Goals: Fair Progress towards PT goals: Progressing toward goals    Frequency    Min 3X/week      PT Plan Discharge plan needs to be updated    Co-evaluation              AM-PAC PT "6 Clicks" Mobility   Outcome Measure  Help needed turning from your back to your side while in a flat bed without using bedrails?: None Help needed moving from lying on your back to sitting on the side of a flat bed without using bedrails?: A Little Help needed moving to and from a bed to a chair (including a wheelchair)?: A Lot Help needed standing up from a chair using your arms (e.g., wheelchair or bedside chair)?: A Lot Help needed to walk in hospital room?: A Lot Help needed climbing 3-5 steps with a railing? : A Lot 6 Click Score: 15    End of Session Equipment Utilized During Treatment: Gait belt Activity Tolerance: Patient tolerated treatment well Patient left: with call bell/phone within reach;in bed;with nursing/sitter in room Nurse Communication: Mobility status PT Visit Diagnosis: Difficulty in walking, not elsewhere classified (R26.2);Unsteadiness on feet (R26.81)     Time: 1430-1453 PT Time Calculation (min) (ACUTE ONLY): 23 min  Charges:  $Gait Training: 8-22 mins $Therapeutic Activity: 8-22 mins                     Anise Salvo, PT Acute Rehab Services Pager [phone] [facility] [phone]     Rayetta Humphrey 01/24/2020, 3:07 PM

## 2020-01-24 NOTE — Plan of Care (Signed)
  Problem: Coping: Goal: Ability to identify appropriate support needs will improve Outcome: Progressing   Problem: Health Behavior/Discharge Planning: Goal: Compliance with prescribed medication regimen will improve Outcome: Progressing   Problem: Safety: Goal: Verbalization of understanding the information provided will improve Outcome: Progressing   Problem: Self-Concept: Goal: Level of anxiety will decrease Outcome: Progressing   Problem: Health Behavior/Discharge Planning: Goal: Ability to manage health-related needs will improve Outcome: Progressing

## 2020-01-24 NOTE — Progress Notes (Signed)
SLP Cancellation Note  Patient Details Name: BREANNA MCDANIEL MRN: [id number] DOB: 1955-12-23   Cancelled treatment:     Pt being bathed. Will continue efforts.  Yaneli Keithley L. Samson Frederic, MA CCC/SLP Acute Rehabilitation Services Office number [phone] Pager [phone]       Blenda Mounts Laurice 01/24/2020, 2:00 PM

## 2020-01-25 DIAGNOSIS — Z7289 Other problems related to lifestyle: Secondary | ICD-10-CM | POA: Diagnosis not present

## 2020-01-25 LAB — COMPREHENSIVE METABOLIC PANEL
ALT: 284 U/L — ABNORMAL HIGH (ref 0–44)
AST: 357 U/L — ABNORMAL HIGH (ref 15–41)
Albumin: 3.2 g/dL — ABNORMAL LOW (ref 3.5–5.0)
Alkaline Phosphatase: 85 U/L (ref 38–126)
Anion gap: 9 (ref 5–15)
BUN: 10 mg/dL (ref 8–23)
CO2: 24 mmol/L (ref 22–32)
Calcium: 9.2 mg/dL (ref 8.9–10.3)
Chloride: 102 mmol/L (ref 98–111)
Creatinine, Ser: 0.86 mg/dL (ref 0.61–1.24)
GFR, Estimated: >60 mL/min (ref >60)
Glucose, Bld: 87 mg/dL (ref 70–99)
Potassium: 4.3 mmol/L (ref 3.5–5.1)
Sodium: 135 mmol/L (ref 135–145)
Total Bilirubin: 1 mg/dL (ref 0.3–1.2)
Total Protein: 6.9 g/dL (ref 6.5–8.1)

## 2020-01-25 LAB — CBC WITH DIFFERENTIAL/PLATELET
Abs Immature Granulocytes: 0.02 10*3/uL (ref 0.00–0.07)
Basophils Absolute: 0.1 10*3/uL (ref 0.0–0.1)
Basophils Relative: 1 %
Eosinophils Absolute: 0.1 10*3/uL (ref 0.0–0.5)
Eosinophils Relative: 3 %
HCT: 43.6 % (ref 39.0–52.0)
Hemoglobin: 14.4 g/dL (ref 13.0–17.0)
Immature Granulocytes: 1 %
Lymphocytes Relative: 22 %
Lymphs Abs: 1 10*3/uL (ref 0.7–4.0)
MCH: 31.4 pg (ref 26.0–34.0)
MCHC: 33 g/dL (ref 30.0–36.0)
MCV: 95.2 fL (ref 80.0–100.0)
Monocytes Absolute: 0.8 10*3/uL (ref 0.1–1.0)
Monocytes Relative: 19 %
Neutro Abs: 2.3 10*3/uL (ref 1.7–7.7)
Neutrophils Relative %: 54 %
Platelets: 150 10*3/uL (ref 150–400)
RBC: 4.58 MIL/uL (ref 4.22–5.81)
RDW: 13 % (ref 11.5–15.5)
WBC: 4.3 10*3/uL (ref 4.0–10.5)
nRBC: 0 % (ref 0.0–0.2)

## 2020-01-25 LAB — MAGNESIUM: Magnesium: 1.8 mg/dL (ref 1.7–2.4)

## 2020-01-25 LAB — AMMONIA: Ammonia: 50 umol/L — ABNORMAL HIGH (ref 9–35)

## 2020-01-25 NOTE — Progress Notes (Signed)
  Speech Language Pathology Treatment: Cognitive-Linquistic  Patient Details Name: Dominic Carter MRN: [id number] DOB: [birth year] Today's Date: 01/25/2020 Time: 1027-1050 SLP Time Calculation (min) (ACUTE ONLY): 23 min  Assessment / Plan / Recommendation Clinical Impression  Pt was seen for cognitive-linguistic treatment and was cooperative during the session. Pt indicated that his speech has been imprecise "for as long as [he] can remember". He achieved 75% accuracy with problem solving related to safety increasing to 100% with cues. He demonstrated 75% accuracy with time management problems when verbal prompts and cues were given. He required intermittent cueing for sequencing. He completed a medication management task with 33% accuracy increasing to 100% with frequent verbal prompts. He initially achieved 60% accuracy with recall of concrete information from recorded voicemails increasing to 100% accuracy with choice cues and repetition. With cueing to improve attention to details, pt was able to achieve 100% with a subsequent voicemail. SLP will continue to follow pt.    HPI HPI: Patient is a 64 y/o male who presents with confusion and new onset seizures. 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      SLP Plan  Continue with current plan of care       Recommendations                   Follow up Recommendations: Home health SLP SLP Visit Diagnosis: Cognitive communication deficit (J49.702) Plan: Continue with current plan of care       Tamar Lipscomb I. Vear Clock, MS, CCC-SLP Acute Rehabilitation Services Office number [phone] Pager [phone]                Scheryl Marten 01/25/2020, 11:01 AM

## 2020-01-25 NOTE — [place] FL2 (Signed)
[place] MEDICAID FL2 LEVEL OF CARE SCREENING TOOL     IDENTIFICATION  Patient Name: Dominic Carter Birthdate: 04/28/1955 Sex: male Admission Date (Current Location): 01/19/2020  [facility] and [place] Number:  Producer, television/film/video and Address:  [facility], [address] [place], [place] [address]      Provider Number: [id number]  Attending Physician Name and Address:  Maretta Bees, MD  Relative Name and Phone Number:       Current Level of Care: Hospital Recommended Level of Care: Skilled Nursing Facility Prior Approval Number:    Date Approved/Denied:   PASRR Number: [id number]  Discharge Plan: SNF    Current Diagnoses: Patient Active Problem List   Diagnosis Date Noted  . Seizures (HCC) 01/21/2020  . Seizure (HCC) 01/20/2020  . ETOH abuse 01/20/2020  . HTN (hypertension) 01/20/2020  . Chronic ischemic right MCA stroke 01/20/2020    Orientation RESPIRATION BLADDER Height & Weight     Self, Place  Normal Continent Weight: 77.1 kg Height:  5\' 11"  (180.3 cm)  BEHAVIORAL SYMPTOMS/MOOD NEUROLOGICAL BOWEL NUTRITION STATUS    Convulsions/Seizures (Keppra 500 mg BID) Continent Diet (heart healthy with thin liquids)  AMBULATORY STATUS COMMUNICATION OF NEEDS Skin   Extensive Assist (Rt BKA) Verbally  (dry and flaky)                       Personal Care Assistance Level of Assistance  Bathing, Feeding, Dressing Bathing Assistance: Limited assistance Feeding assistance: Limited assistance Dressing Assistance: Limited assistance     Functional Limitations Info  Sight, Hearing, Speech Sight Info: Adequate Hearing Info: Adequate Speech Info: Impaired (slur/ dysarthria)    SPECIAL CARE FACTORS FREQUENCY  PT (By licensed PT), OT (By licensed OT), Speech therapy     PT Frequency: 5x/wk OT Frequency: 5x/wk     Speech Therapy Frequency: 5x/wk      Contractures Contractures Info: Not present    Additional Factors Info   Code Status, Allergies, Psychotropic Code Status Info: Full Allergies Info: NKA Psychotropic Info: Librium 10 mg three times a day         Current Medications (01/25/2020):  This is the current hospital active medication list Current Facility-Administered Medications  Medication Dose Route Frequency Provider Last Rate Last Admin  . acetaminophen (TYLENOL) tablet 650 mg  650 mg Oral Q4H PRN 01/27/2020, DO       Or  . acetaminophen (TYLENOL) 160 MG/5ML solution 650 mg  650 mg Per Tube Q4H PRN Hillary Bow, DO       Or  . acetaminophen (TYLENOL) suppository 650 mg  650 mg Rectal Q4H PRN Hillary Bow, DO      . amLODipine (NORVASC) tablet 10 mg  10 mg Oral Daily Hillary Bow, MD   10 mg at 01/25/20 1116  . aspirin EC tablet 81 mg  81 mg Oral Daily 01/27/20, MD   81 mg at 01/25/20 1116  . chlordiazePOXIDE (LIBRIUM) capsule 10 mg  10 mg Oral TID 01/27/20, MD   10 mg at 01/25/20 1116  . enoxaparin (LOVENOX) injection 40 mg  40 mg Subcutaneous Q24H 01/27/20 M, DO   40 mg at 01/25/20 1148  . folic acid (FOLVITE) tablet 1 mg  1 mg Oral Daily 01/27/20 M, DO   1 mg at 01/25/20 1116  . levETIRAcetam (KEPPRA) tablet 500 mg  500 mg Oral BID 01/27/20, MD  500 mg at 01/25/20 1116  . LORazepam (ATIVAN) tablet 1-4 mg  1-4 mg Oral Q1H PRN Hughie Closs, MD   2 mg at 01/23/20 1013   Or  . LORazepam (ATIVAN) injection 1-4 mg  1-4 mg Intravenous Q1H PRN Hughie Closs, MD   1 mg at 01/25/20 0005  . multivitamin with minerals tablet 1 tablet  1 tablet Oral Daily Lyda Perone M, DO   1 tablet at 01/25/20 1116  . nicotine (NICODERM CQ - dosed in mg/24 hours) patch 21 mg  21 mg Transdermal Daily Leandro Reasoner Tublu, MD   21 mg at 01/25/20 1149  . thiamine tablet 100 mg  100 mg Oral Daily Lyda Perone M, DO   100 mg at 01/25/20 1116   Or  . thiamine (B-1) injection 100 mg  100 mg Intravenous Daily Lyda Perone M, DO   100 mg at 01/22/20 1008      Discharge Medications: Please see discharge summary for a list of discharge medications.  Relevant Imaging Results:  Relevant Lab Results:   Additional Information SS#: [id number]  Kermit Balo, RN

## 2020-01-25 NOTE — TOC Initial Note (Signed)
Transition of Care [facility]) - Initial/Assessment Note    Patient Details  Name: Dominic Carter MRN: [id number] Date of Birth: [birth year]  [facility] CM/SW Contact:    Pollie Friar, RN Phone Number: 01/25/2020, 1:27 PM  Clinical Narrative:                 CM met with the patient and his daughter at the bedside. They are interested in SNF rehab when he is medically ready. They are in agreement with having him faxed out in the [facility] area. Will provided bed offers once available. TOC following.  Expected Discharge Plan: Skilled Nursing Facility Barriers to Discharge: Continued Medical Work up   Patient Goals and CMS Choice   CMS Medicare.gov Compare Post Acute Care list provided to:: Patient Represenative (must comment) Choice offered to / list presented to : Patient, Adult Children  Expected Discharge Plan and Services Expected Discharge Plan: [place] In-house Referral: Clinical Social Work Discharge Planning Services: CM Consult Post Acute Care Choice: [place] arrangements for the past 2 months: Apartment                                      Prior Living Arrangements/Services Living arrangements for the past 2 months: Apartment Lives with:: Self Patient language and need for interpreter reviewed:: Yes Do you feel safe going back to the place where you live?: Yes      Need for Family Participation in Patient Care: Yes (Comment) Care giver support system in place?: No (comment)   Criminal Activity/Legal Involvement Pertinent to Current Situation/Hospitalization: No - Comment as needed  Activities of Daily Living      Permission Sought/Granted                  Emotional Assessment Appearance:: Appears stated age Attitude/Demeanor/Rapport:  (quiet) Affect (typically observed): Accepting Orientation: : Oriented to Place, Oriented to Self Alcohol / Substance Use: Alcohol Use Psych  Involvement: No (comment)  Admission diagnosis:  Seizure ([place]) [R56.9] Seizures ([place]) [R56.9] MRI contraindicated due to metal implant [Z53.09] Patient Active Problem List   Diagnosis Date Noted  . Seizures ([place]) 01/21/2020  . Seizure ([place]) 01/20/2020  . ETOH abuse 01/20/2020  . HTN (hypertension) 01/20/2020  . Chronic ischemic right MCA stroke 01/20/2020   PCP:  Patient, No Pcp Per Pharmacy:   [place], [place] [address] Phone: [phone] Fax: [phone]     Social Determinants of Health (SDOH) Interventions    Readmission Risk Interventions No flowsheet data found.

## 2020-01-25 NOTE — Plan of Care (Signed)
  Problem: Education: Goal: Expressions of having a comfortable level of knowledge regarding the disease process will increase Outcome: Progressing   Problem: Coping: Goal: Ability to adjust to condition or change in health will improve Outcome: Progressing Goal: Ability to identify appropriate support needs will improve Outcome: Progressing   Problem: Health Behavior/Discharge Planning: Goal: Compliance with prescribed medication regimen will improve Outcome: Progressing   Problem: Medication: Goal: Risk for medication side effects will decrease Outcome: Progressing   Problem: Clinical Measurements: Goal: Complications related to the disease process, condition or treatment will be avoided or minimized Outcome: Progressing Goal: Diagnostic test results will improve Outcome: Progressing   Problem: Safety: Goal: Verbalization of understanding the information provided will improve Outcome: Progressing   Problem: Self-Concept: Goal: Level of anxiety will decrease Outcome: Progressing Goal: Ability to verbalize feelings about condition will improve Outcome: Progressing   Problem: Education: Goal: Knowledge of General Education information will improve Description: Including pain rating scale, medication(s)/side effects and non-pharmacologic comfort measures Outcome: Progressing   Problem: Health Behavior/Discharge Planning: Goal: Ability to manage health-related needs will improve Outcome: Progressing   Problem: Clinical Measurements: Goal: Ability to maintain clinical measurements within normal limits will improve Outcome: Progressing Goal: Will remain free from infection Outcome: Progressing Goal: Diagnostic test results will improve Outcome: Progressing Goal: Respiratory complications will improve Outcome: Progressing Goal: Cardiovascular complication will be avoided Outcome: Progressing   Problem: Nutrition: Goal: Adequate nutrition will be maintained Outcome:  Progressing   Problem: Coping: Goal: Level of anxiety will decrease Outcome: Progressing   Problem: Elimination: Goal: Will not experience complications related to bowel motility Outcome: Progressing Goal: Will not experience complications related to urinary retention Outcome: Progressing   Problem: Pain Managment: Goal: General experience of comfort will improve Outcome: Progressing   Problem: Safety: Goal: Ability to remain free from injury will improve Outcome: Progressing   Problem: Skin Integrity: Goal: Risk for impaired skin integrity will decrease Outcome: Progressing   Problem: Education: Goal: Knowledge of disease or condition will improve Outcome: Progressing Goal: Understanding of discharge needs will improve Outcome: Progressing   Problem: Health Behavior/Discharge Planning: Goal: Ability to identify changes in lifestyle to reduce recurrence of condition will improve Outcome: Progressing Goal: Identification of resources available to assist in meeting health care needs will improve Outcome: Progressing   Problem: Physical Regulation: Goal: Complications related to the disease process, condition or treatment will be avoided or minimized Outcome: Progressing   Problem: Safety: Goal: Ability to remain free from injury will improve Outcome: Progressing   Problem: Education: Goal: Knowledge of disease or condition will improve Outcome: Progressing Goal: Knowledge of secondary prevention will improve Outcome: Progressing Goal: Knowledge of patient specific risk factors addressed and post discharge goals established will improve Outcome: Progressing   Problem: Coping: Goal: Will verbalize positive feelings about self Outcome: Progressing Goal: Will identify appropriate support needs Outcome: Progressing   Problem: Health Behavior/Discharge Planning: Goal: Ability to manage health-related needs will improve Outcome: Progressing   

## 2020-01-25 NOTE — Progress Notes (Signed)
Physical Therapy Treatment Patient Details Name: Dominic Carter MRN: [id number] DOB: [birth year] Today's Date: 01/25/2020    History of Present Illness Patient is a 64 y/o male who presents with confusion and new onset seizures. 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 daily ETOH use now on CIWA protocol.    PT Comments    Pt was seen for gait on RW with sitter following with a chair.  Following him provided opportunity to walk longer trip, but pt is unsafe with determining his limits.  He is in chair at end of session with sitter to go down the hall and get out of his room.  Follow as tolerated for goals of PT and focus on checking prosthetic fit next session.  PT became aware his leg was in room after starting his gait session.  Follow Up Recommendations  SNF     Equipment Recommendations  Other (comment);Wheelchair cushion (measurements PT);Wheelchair (measurements PT)    Recommendations for Other Services       Precautions / Restrictions Precautions Precautions: Fall Precaution Comments: seizure precautions Restrictions Weight Bearing Restrictions: No Other Position/Activity Restrictions: prosthesis needed if fitting him    Mobility  Bed Mobility Overal bed mobility: Needs Assistance Bed Mobility: Supine to Sit     Supine to sit: Min guard        Transfers Overall transfer level: Needs assistance Equipment used: Rolling walker (2 wheeled) Transfers: Sit to/from Stand Sit to Stand: Mod assist        Lateral/Scoot Transfers: Min guard    Ambulation/Gait Ambulation/Gait assistance: Min assist;Mod assist Gait Distance (Feet): 100 Feet (30+70) Assistive device: Rolling walker (2 wheeled)   Gait velocity: decreased Gait velocity interpretation: <1.8 ft/sec, indicate of risk for recurrent falls General Gait Details: hop to pattern with better heel to toe on LLE as he progressed   Stairs              Wheelchair Mobility    Modified Rankin (Stroke Patients Only)       Balance Overall balance assessment: Needs assistance Sitting-balance support: Feet supported Sitting balance-Leahy Scale: Fair       Standing balance-Leahy Scale: Poor Standing balance comment: required RW and assist                            Cognition Arousal/Alertness: Awake/alert Behavior During Therapy: Impulsive Overall Cognitive Status: Impaired/Different from baseline Area of Impairment: Problem solving;Awareness;Safety/judgement;Following commands;Memory;Attention;Orientation                 Orientation Level: Time;Situation Current Attention Level: Selective Memory: Decreased recall of precautions Following Commands: Follows one step commands inconsistently;Follows one step commands with increased time Safety/Judgement: Decreased awareness of deficits;Decreased awareness of safety Awareness: Intellectual Problem Solving: Slow processing;Requires verbal cues;Requires tactile cues General Comments: Pt was trying to walk with RLE propped on bed and not fully standing, talked with him about upright standing      Exercises      General Comments General comments (skin integrity, edema, etc.): pt was followed by his sitter and could be safely mobilitzed with gait belt and chair following      Pertinent Vitals/Pain Pain Assessment: No/denies pain    Home Living                      Prior Function  PT Goals (current goals can now be found in the care plan section) Acute Rehab PT Goals Patient Stated Goal: none stated Progress towards PT goals: Progressing toward goals    Frequency    Min 3X/week      PT Plan Current plan remains appropriate    Co-evaluation              AM-PAC PT "6 Clicks" Mobility   Outcome Measure  Help needed turning from your back to your side while in a flat bed without using bedrails?: A  Little Help needed moving from lying on your back to sitting on the side of a flat bed without using bedrails?: A Little Help needed moving to and from a bed to a chair (including a wheelchair)?: A Little Help needed standing up from a chair using your arms (e.g., wheelchair or bedside chair)?: A Lot Help needed to walk in hospital room?: A Lot Help needed climbing 3-5 steps with a railing? : A Lot 6 Click Score: 15    End of Session Equipment Utilized During Treatment: Gait belt Activity Tolerance: Patient tolerated treatment well Patient left: with nursing/sitter in room;in chair Nurse Communication: Mobility status PT Visit Diagnosis: Difficulty in walking, not elsewhere classified (R26.2);Unsteadiness on feet (R26.81)     Time: 4010-2725 PT Time Calculation (min) (ACUTE ONLY): 26 min  Charges:  $Gait Training: 8-22 mins $Therapeutic Activity: 8-22 mins                    Ivar Drape 01/25/2020, 6:46 PM  Samul Dada, PT MS Acute Rehab Dept. Number: [facility] [phone] and [facility] [phone]

## 2020-01-25 NOTE — Progress Notes (Addendum)
PROGRESS NOTE        PATIENT DETAILS Name: Dominic Carter Age: 64 y.o. Sex: male Date of Birth: 03/13/1956 Admit Date: 01/19/2020 Admitting Physician Hillary BowJared M Gardner, DO ZOX:[id number]:Patient, No Pcp Per  Brief Narrative: Patient is a 64 y.o. male with history of right BKA, VTE, HTN, HLD-alcohol abuse-who presented to the ED after he had episode of seizure at home.  Further hospital course complicated by alcohol withdrawal, and EtOH hepatitis.  Significant events: 10/14>> [facility]  Significant studies: 10/14>> chest x-ray: No active disease 10/14>> CT head: No acute intracranial abnormality 10/15>> MRI brain: No acute intracranial abnormality, remote posterior right territory MCA infarct. 10/15>> MRI brain: No new proximal intracranial vessel occlusion-chronic right vertebral artery occlusion 10/15>> Echo: EF 40%-anterior septum, mid inferoseptal segment and basal inferoseptal segment hypokinetic 10/15>> carotid ultrasound: No significant stenosis 10/15>> A1c: 5.4 10/16>> RUQ ultrasound: Fatty infiltration of liver-no acute findings.  Antimicrobial therapy: None  Microbiology data: None  Procedures : 10/15 >>EEG: No seizure or epileptiform discharge, study suggestive of cortical dysfunction in the right temporal region  Consults: Neurology  DVT Prophylaxis : enoxaparin (LOVENOX) injection 40 mg Start: 01/20/20 1000  Subjective: Awake/alert-no major issues overnight.  Assessment/Plan: Seizure disorder: No further seizures since admission-evaluated by neurology-started on Keppra.  No driving and other seizure restrictions on discharge.  EtOH withdrawal: Improving-relatively awake and alert-continue tapering Librium and as needed Ativan per CIWA protocol  EtOH hepatitis: LFTs continue to slowly worsen-repeat LFTs and coags in a.m.-if continue to worsen-we will consider GI evaluation.  HTN: Controlled-continue  amlodipine  Right BKA  History of CVA: Remains on aspirin-no longer on statins due to hepatitis.   Diet: Diet Order            Diet Heart Room service appropriate? No; Fluid consistency: Thin  Diet effective now                  Code Status: Full code   Family Communication: Called daughter GrenadaBrittany Johnson-[phone]-left a voicemail on 10/20  Disposition Plan: Status is: Inpatient  Remains inpatient appropriate because:Inpatient level of care appropriate due to severity of illness   Dispo: The patient is from: Home              Anticipated d/c is to: SNF              Anticipated d/c date is: 2 days              Patient currently is not medically stable to d/c.   Barriers to Discharge: Alcoholic hepatitis-resolving alcohol withdrawal symptoms.  Antimicrobial agents: Anti-infectives (From admission, onward)   None       Time spent: 25 minutes-Greater than 50% of this time was spent in counseling, explanation of diagnosis, planning of further management, and coordination of care.  MEDICATIONS: Scheduled Meds: . amLODipine  10 mg Oral Daily  . aspirin EC  81 mg Oral Daily  . chlordiazePOXIDE  10 mg Oral TID  . enoxaparin (LOVENOX) injection  40 mg Subcutaneous Q24H  . folic acid  1 mg Oral Daily  . levETIRAcetam  500 mg Oral BID  . multivitamin with minerals  1 tablet Oral Daily  . nicotine  21 mg Transdermal Daily  . thiamine  100 mg Oral Daily   Or  . thiamine  100 mg  Intravenous Daily   Continuous Infusions: PRN Meds:.acetaminophen **OR** acetaminophen (TYLENOL) oral liquid 160 mg/5 mL **OR** acetaminophen, LORazepam **OR** LORazepam   PHYSICAL EXAM: Vital signs: Vitals:   01/25/20 0351 01/25/20 0823 01/25/20 1116 01/25/20 1158  BP: (!) 152/87 110/84 116/77 124/81  Pulse: 88 81  84  Resp: 19 20  20   Temp: (!) 97.5 F (36.4 C) 97.7 F (36.5 C)  98 F (36.7 C)  TempSrc: Oral Oral  Oral  SpO2: 100% 100%    Weight:      Height:        Filed Weights   01/19/20 2158  Weight: 77.1 kg   Body mass index is 23.71 kg/m.   Gen Exam:Alert awake-not in any distress HEENT:atraumatic, normocephalic Chest: B/L clear to auscultation anteriorly CVS:S1S2 regular Abdomen:soft non tender, non distended Extremities:Right BKA Neurology: Non focal Skin: no rash  I have personally reviewed following labs and imaging studies  LABORATORY DATA: CBC: Recent Labs  Lab 01/19/20 2216 01/19/20 2216 01/21/20 0428 01/22/20 0147 01/23/20 0120 01/24/20 0410 01/25/20 0618  WBC 6.8   < > 4.0 4.9 4.4 4.8 4.3  NEUTROABS 4.2  --   --  3.2 2.6 2.8 2.3  HGB 13.8   < > 14.4 14.0 14.0 13.8 14.4  HCT 41.8   < > 43.5 42.3 41.7 41.1 43.6  MCV 96.3   < > 92.4 92.4 92.3 92.2 95.2  PLT 124*   < > 109* 113* 126* 130* 150   < > = values in this interval not displayed.    Basic Metabolic Panel: Recent Labs  Lab 01/19/20 2216 01/20/20 0057 01/21/20 0428 01/22/20 0147 01/23/20 0120 01/24/20 0410 01/25/20 0618  NA   < >  --  136 136 136 136 135  K   < >  --  3.5 3.7 3.8 4.1 4.3  CL   < >  --  100 102 104 103 102  CO2   < >  --  22 23 22 22 24   GLUCOSE   < >  --  73 96 88 97 87  BUN   < >  --  6* 6* 6* 7* 10  CREATININE   < >  --  0.85 1.00 0.93 0.95 0.86  CALCIUM   < >  --  8.7* 9.4 9.6 9.2 9.2  MG  --  1.8  --  1.7 2.0  --  1.8  PHOS  --  2.0*  --   --   --   --   --    < > = values in this interval not displayed.    GFR: Estimated Creatinine Clearance: 92.4 mL/min (by C-G formula based on SCr of 0.86 mg/dL).  Liver Function Tests: Recent Labs  Lab 01/19/20 2216 01/22/20 0147 01/23/20 0120 01/24/20 0410 01/25/20 0618  AST 82* 121* 227* 355* 357*  ALT 41 42 91* 200* 284*  ALKPHOS 99 83 88 75 85  BILITOT 1.0 1.2 1.1 1.1 1.0  PROT 7.4 6.7 6.8 6.6 6.9  ALBUMIN 3.8 3.2* 3.2* 3.2* 3.2*   No results for input(s): LIPASE, AMYLASE in the last 168 hours. Recent Labs  Lab 01/19/20 2217 01/21/20 1012 01/22/20 0827  01/24/20 1119 01/25/20 0618  AMMONIA 67* 38* 45* 52* 50*    Coagulation Profile: No results for input(s): INR, PROTIME in the last 168 hours.  Cardiac Enzymes: No results for input(s): CKTOTAL, CKMB, CKMBINDEX, TROPONINI in the last 168 hours.  BNP (last 3 results) No results  for input(s): PROBNP in the last 8760 hours.  Lipid Profile: No results for input(s): CHOL, HDL, LDLCALC, TRIG, CHOLHDL, LDLDIRECT in the last 72 hours.  Thyroid Function Tests: No results for input(s): TSH, T4TOTAL, FREET4, T3FREE, THYROIDAB in the last 72 hours.  Anemia Panel: No results for input(s): VITAMINB12, FOLATE, FERRITIN, TIBC, IRON, RETICCTPCT in the last 72 hours.  Urine analysis:    Component Value Date/Time   COLORURINE YELLOW 01/19/2020 2351   APPEARANCEUR CLEAR 01/19/2020 2351   LABSPEC 1.012 01/19/2020 2351   PHURINE 6.0 01/19/2020 2351   GLUCOSEU NEGATIVE 01/19/2020 2351   HGBUR SMALL (A) 01/19/2020 2351   BILIRUBINUR NEGATIVE 01/19/2020 2351   KETONESUR 5 (A) 01/19/2020 2351   PROTEINUR 100 (A) 01/19/2020 2351   NITRITE NEGATIVE 01/19/2020 2351   LEUKOCYTESUR NEGATIVE 01/19/2020 2351    Sepsis Labs: Lactic Acid, Venous No results found for: LATICACIDVEN  MICROBIOLOGY: Recent Results (from the past 240 hour(s))  Respiratory Panel by RT PCR (Flu A&B, Covid) - Nasopharyngeal Swab     Status: None   Collection Time: 01/20/20  2:29 AM   Specimen: Nasopharyngeal Swab  Result Value Ref Range Status   SARS Coronavirus 2 by RT PCR NEGATIVE NEGATIVE Final    Comment: (NOTE) SARS-CoV-2 target nucleic acids are NOT DETECTED.  The SARS-CoV-2 RNA is generally detectable in upper respiratoy specimens during the acute phase of infection. The lowest concentration of SARS-CoV-2 viral copies this assay can detect is 131 copies/mL. A negative result does not preclude SARS-Cov-2 infection and should not be used as the sole basis for treatment or other patient management decisions. A  negative result may occur with  improper specimen collection/handling, submission of specimen other than nasopharyngeal swab, presence of viral mutation(s) within the areas targeted by this assay, and inadequate number of viral copies (<131 copies/mL). A negative result must be combined with clinical observations, patient history, and epidemiological information. The expected result is Negative.  Fact Sheet for Patients:  https://www.moore.com/  Fact Sheet for Healthcare Providers:  https://www.young.biz/  This test is no t yet approved or cleared by the [place] FDA and  has been authorized for detection and/or diagnosis of SARS-CoV-2 by FDA under an Emergency Use Authorization (EUA). This EUA will remain  in effect (meaning this test can be used) for the duration of the COVID-19 declaration under Section 564(b)(1) of the Act, 21 U.S.C. section 360bbb-3(b)(1), unless the authorization is terminated or revoked sooner.     Influenza A by PCR NEGATIVE NEGATIVE Final   Influenza B by PCR NEGATIVE NEGATIVE Final    Comment: (NOTE) The Xpert Xpress SARS-CoV-2/FLU/RSV assay is intended as an aid in  the diagnosis of influenza from Nasopharyngeal swab specimens and  should not be used as a sole basis for treatment. Nasal washings and  aspirates are unacceptable for Xpert Xpress SARS-CoV-2/FLU/RSV  testing.  Fact Sheet for Patients: https://www.moore.com/  Fact Sheet for Healthcare Providers: https://www.young.biz/  This test is not yet approved or cleared by the [place] FDA and  has been authorized for detection and/or diagnosis of SARS-CoV-2 by  FDA under an Emergency Use Authorization (EUA). This EUA will remain  in effect (meaning this test can be used) for the duration of the  Covid-19 declaration under Section 564(b)(1) of the Act, 21  U.S.C. section 360bbb-3(b)(1), unless the authorization  is  terminated or revoked. Performed at [facility], [address], [place], [place] [address]     RADIOLOGY STUDIES/RESULTS: No results found.  LOS: 4 days   Jeoffrey Massed, MD  [facility]    To contact the attending provider between 7A-7P or the covering provider during after hours 7P-7A, please log into the web site [email or web address] and access using universal [place] password for that web site. If you do not have the password, please call the hospital operator.  01/25/2020, 3:09 PM

## 2020-01-26 DIAGNOSIS — Z7289 Other problems related to lifestyle: Secondary | ICD-10-CM | POA: Diagnosis not present

## 2020-01-26 LAB — COMPREHENSIVE METABOLIC PANEL
ALT: 298 U/L — ABNORMAL HIGH (ref 0–44)
AST: 305 U/L — ABNORMAL HIGH (ref 15–41)
Albumin: 3.3 g/dL — ABNORMAL LOW (ref 3.5–5.0)
Alkaline Phosphatase: 93 U/L (ref 38–126)
Anion gap: 8 (ref 5–15)
BUN: 11 mg/dL (ref 8–23)
CO2: 25 mmol/L (ref 22–32)
Calcium: 9.4 mg/dL (ref 8.9–10.3)
Chloride: 102 mmol/L (ref 98–111)
Creatinine, Ser: 0.97 mg/dL (ref 0.61–1.24)
GFR, Estimated: >60 mL/min (ref >60)
Glucose, Bld: 91 mg/dL (ref 70–99)
Potassium: 4.4 mmol/L (ref 3.5–5.1)
Sodium: 135 mmol/L (ref 135–145)
Total Bilirubin: 0.8 mg/dL (ref 0.3–1.2)
Total Protein: 7.3 g/dL (ref 6.5–8.1)

## 2020-01-26 LAB — CBC
HCT: 44.8 % (ref 39.0–52.0)
Hemoglobin: 14.6 g/dL (ref 13.0–17.0)
MCH: 31 pg (ref 26.0–34.0)
MCHC: 32.6 g/dL (ref 30.0–36.0)
MCV: 95.1 fL (ref 80.0–100.0)
Platelets: 167 10*3/uL (ref 150–400)
RBC: 4.71 MIL/uL (ref 4.22–5.81)
RDW: 12.9 % (ref 11.5–15.5)
WBC: 5.1 10*3/uL (ref 4.0–10.5)
nRBC: 0 % (ref 0.0–0.2)

## 2020-01-26 LAB — PROTIME-INR
INR: 1 (ref 0.8–1.2)
Prothrombin Time: 13.2 seconds (ref 11.4–15.2)

## 2020-01-26 LAB — AMMONIA: Ammonia: 50 umol/L — ABNORMAL HIGH (ref 9–35)

## 2020-01-26 LAB — SARS CORONAVIRUS 2 BY RT PCR (HOSPITAL ORDER, PERFORMED IN ~~LOC~~ HOSPITAL LAB): SARS Coronavirus 2: NEGATIVE

## 2020-01-26 LAB — MAGNESIUM: Magnesium: 1.8 mg/dL (ref 1.7–2.4)

## 2020-01-26 MED ORDER — CHLORDIAZEPOXIDE HCL 5 MG PO CAPS
5.0000 mg | ORAL_CAPSULE | Freq: Three times a day (TID) | ORAL | Status: DC
  Administered 2020-01-26 (×2): 5 mg via ORAL
  Filled 2020-01-26 (×2): qty 1

## 2020-01-26 NOTE — Progress Notes (Signed)
Physical Therapy Treatment Patient Details Name: Dominic Carter MRN: [id number] DOB: 1955/08/08 Today's Date: 01/26/2020    History of Present Illness Patient is a 64 y/o male who presents with confusion and new onset seizures. 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 daily ETOH use now on CIWA protocol.    PT Comments    Patient is mobilizing well and progressing towards goals. He required less assist with mobility from previous sessions. Would benefit from chair to follow to increase ambulation distance. Pt reports his prosthetic is at [employer] (step daughter?). He requested assist to call her during session but was unsure of her correct phone number and was unable to contact her. Continued concerns about pt's cognition and safety awareness. Feel pt remains appropriate for SNF level therapies at d/c. Will continue to follow acutely.     Follow Up Recommendations  SNF     Equipment Recommendations  Other (comment);Wheelchair cushion (measurements PT);Wheelchair (measurements PT)    Recommendations for Other Services       Precautions / Restrictions Precautions Precautions: Fall Precaution Comments: seizure precautions Restrictions Other Position/Activity Restrictions: prosthesis needed but not in room to see fit    Mobility  Bed Mobility Overal bed mobility: Needs Assistance Bed Mobility: Supine to Sit     Supine to sit: Min guard Sit to supine: Min guard   General bed mobility comments: pt able to progress to long sitting before swinging LE off EOB. min guard for safety  Transfers Overall transfer level: Needs assistance Equipment used: Rolling walker (2 wheeled) Transfers: Sit to/from Stand Sit to Stand: Min assist         General transfer comment: min A for stability and safety with transfers. Pt attempting to sit prematurely 2x.  Ambulation/Gait Ambulation/Gait assistance: Min  assist Gait Distance (Feet): 30 Feet Assistive device: Rolling walker (2 wheeled)   Gait velocity: decreased   General Gait Details: hop to pattern. Heavy reliant on RW for support.    Stairs             Wheelchair Mobility    Modified Rankin (Stroke Patients Only) Modified Rankin (Stroke Patients Only) Pre-Morbid Rankin Score: Slight disability Modified Rankin: Moderately severe disability     Balance Overall balance assessment: Needs assistance Sitting-balance support: Feet supported Sitting balance-Leahy Scale: Fair Sitting balance - Comments: able to sit EOB with close guarding   Standing balance support: During functional activity Standing balance-Leahy Scale: Poor Standing balance comment: required RW and assist                            Cognition Arousal/Alertness: Awake/alert Behavior During Therapy: Impulsive;WFL for tasks assessed/performed Overall Cognitive Status: Impaired/Different from baseline Area of Impairment: Problem solving;Awareness;Safety/judgement;Following commands;Memory;Attention                 Orientation Level:  (not checked) Current Attention Level: Selective Memory: Decreased recall of precautions Following Commands: Follows one step commands with increased time;Follows one step commands consistently Safety/Judgement: Decreased awareness of deficits;Decreased awareness of safety Awareness: Intellectual Problem Solving: Slow processing;Requires verbal cues;Requires tactile cues General Comments: Following commands well today, though does struggle to use room phone to call family member and struggling to recall family's phone #. Possibly illiterate as he is requesting help reading paperwork in room.       Exercises Other Exercises Other Exercises: sit<>stand 10x for neuromuscular re-ed. Cues for controlled descent  into chair.     General Comments General comments (skin integrity, edema, etc.): pt       Pertinent Vitals/Pain      Home Living                      Prior Function            PT Goals (current goals can now be found in the care plan section) Acute Rehab PT Goals Patient Stated Goal: none stated PT Goal Formulation: With patient Time For Goal Achievement: 02/03/20 Potential to Achieve Goals: Fair Progress towards PT goals: Progressing toward goals    Frequency    Min 3X/week      PT Plan Current plan remains appropriate    Co-evaluation              AM-PAC PT "6 Clicks" Mobility   Outcome Measure  Help needed turning from your back to your side while in a flat bed without using bedrails?: A Little Help needed moving from lying on your back to sitting on the side of a flat bed without using bedrails?: A Little Help needed moving to and from a bed to a chair (including a wheelchair)?: A Little Help needed standing up from a chair using your arms (e.g., wheelchair or bedside chair)?: A Little Help needed to walk in hospital room?: A Little Help needed climbing 3-5 steps with a railing? : A Lot 6 Click Score: 17    End of Session Equipment Utilized During Treatment: Gait belt Activity Tolerance: Patient tolerated treatment well Patient left: with nursing/sitter in room;in chair;with chair alarm set (sitter just outside room) Nurse Communication: Mobility status PT Visit Diagnosis: Difficulty in walking, not elsewhere classified (R26.2);Unsteadiness on feet (R26.81)     Time: 3491-7915 PT Time Calculation (min) (ACUTE ONLY): 31 min  Charges:  $Gait Training: 8-22 mins $Neuromuscular Re-education: 8-22 mins                     Kallie Locks, [place] Pager [phone] Acute Rehab   Sheral Apley 01/26/2020, 3:11 PM

## 2020-01-26 NOTE — Care Management Important Message (Signed)
Important Message  Patient Details  Name: Dominic Carter MRN: [id number] Date of Birth: 11-03-1955   Medicare Important Message Given:  Yes     Dorena Bodo 01/26/2020, 2:46 PM

## 2020-01-26 NOTE — Progress Notes (Signed)
PROGRESS NOTE        PATIENT DETAILS Name: Dominic Carter Age: 64 y.o. Sex: male Date of Birth: December 17, 1955 Admit Date: 01/19/2020 Admitting Physician Hillary Bow, DO RRN:[id number], No Pcp Per  Brief Narrative: Patient is a 64 y.o. male with history of right BKA, VTE, HTN, HLD-alcohol abuse-who presented to the ED after he had episode of seizure at home.  Further hospital course complicated by alcohol withdrawal, and EtOH hepatitis.  Significant events: 10/14>> admit to [facility] for seizure activity.  Significant studies: 10/14>> chest x-ray: No active disease 10/14>> CT head: No acute intracranial abnormality 10/15>> MRI brain: No acute intracranial abnormality, remote posterior right territory MCA infarct. 10/15>> MRI brain: No new proximal intracranial vessel occlusion-chronic right vertebral artery occlusion 10/15>> Echo: EF 40%-anterior septum, mid inferoseptal segment and basal inferoseptal segment hypokinetic 10/15>> carotid ultrasound: No significant stenosis 10/15>> A1c: 5.4 10/16>> RUQ ultrasound: Fatty infiltration of liver-no acute findings.  Antimicrobial therapy: None  Microbiology data: None  Procedures : 10/15 >>EEG: No seizure or epileptiform discharge, study suggestive of cortical dysfunction in the right temporal region  Consults: Neurology  DVT Prophylaxis : enoxaparin (LOVENOX) injection 40 mg Start: 01/20/20 1000  Subjective: Awake/alert-no major issues overnight.  Assessment/Plan: Seizure disorder: No further seizures since admission-evaluated by neurology-started on Keppra.  No driving and other seizure restrictions on discharge.  EtOH withdrawal: Significantly better-relatively awake and alert this morning-continue to taper Librium-has completed Ativan per CIWA protocol.  EtOH hepatitis: LFTs seems to have plateaued-INR/bilirubin within normal limits-continue to avoid hepatotoxic agents-suspect LFTs will trend down  soon-continue supportive care.  Will consult GI if LFTs significantly worsen.  HTN: Controlled-continue amlodipine  Right BKA  History of CVA: Remains on aspirin-no longer on statins due to hepatitis.   Diet: Diet Order            Diet Heart Room service appropriate? No; Fluid consistency: Thin  Diet effective now                  Code Status: Full code   Family Communication: Called daughter [name] Johnson-[phone]-left a voicemail on 10/20  Disposition Plan: Status is: Inpatient  Remains inpatient appropriate because:Inpatient level of care appropriate due to severity of illness   Dispo: The patient is from: Home              Anticipated d/c is to: SNF              Anticipated d/c date is: 2 days              Patient currently is not medically stable to d/c.   Barriers to Discharge: Alcoholic hepatitis-resolving alcohol withdrawal symptoms.  Antimicrobial agents: Anti-infectives (From admission, onward)   None       Time spent: 25 minutes-Greater than 50% of this time was spent in counseling, explanation of diagnosis, planning of further management, and coordination of care.  MEDICATIONS: Scheduled Meds: . amLODipine  10 mg Oral Daily  . aspirin EC  81 mg Oral Daily  . chlordiazePOXIDE  5 mg Oral TID  . enoxaparin (LOVENOX) injection  40 mg Subcutaneous Q24H  . folic acid  1 mg Oral Daily  . levETIRAcetam  500 mg Oral BID  . multivitamin with minerals  1 tablet Oral Daily  . nicotine  21 mg Transdermal Daily  . thiamine  100  mg Oral Daily   Or  . thiamine  100 mg Intravenous Daily   Continuous Infusions: PRN Meds:.acetaminophen **OR** acetaminophen (TYLENOL) oral liquid 160 mg/5 mL **OR** acetaminophen   PHYSICAL EXAM: Vital signs: Vitals:   01/26/20 0000 01/26/20 0432 01/26/20 0745 01/26/20 1135  BP: 133/78 121/70 107/67 119/77  Pulse: 74 73 72 77  Resp: 18 17 16 16   Temp: 98.2 F (36.8 C) 98.5 F (36.9 C) 98.3 F (36.8 C) 97.9 F  (36.6 C)  TempSrc: Oral Oral Oral Oral  SpO2: 99% 100% 100% 98%  Weight:      Height:       Filed Weights   01/19/20 2158  Weight: 77.1 kg   Body mass index is 23.71 kg/m.   Gen Exam:Alert awake-not in any distress HEENT:atraumatic, normocephalic Chest: B/L clear to auscultation anteriorly CVS:S1S2 regular Abdomen:soft non tender, non distended Extremities: Right BKA Neurology: Non focal Skin: no rash  I have personally reviewed following labs and imaging studies  LABORATORY DATA: CBC: Recent Labs  Lab 01/19/20 2216 01/21/20 0428 01/22/20 0147 01/23/20 0120 01/24/20 0410 01/25/20 0618 01/26/20 0744  WBC 6.8   < > 4.9 4.4 4.8 4.3 5.1  NEUTROABS 4.2  --  3.2 2.6 2.8 2.3  --   HGB 13.8   < > 14.0 14.0 13.8 14.4 14.6  HCT 41.8   < > 42.3 41.7 41.1 43.6 44.8  MCV 96.3   < > 92.4 92.3 92.2 95.2 95.1  PLT 124*   < > 113* 126* 130* 150 167   < > = values in this interval not displayed.    Basic Metabolic Panel: Recent Labs  Lab 01/20/20 0057 01/21/20 0428 01/22/20 0147 01/23/20 0120 01/24/20 0410 01/25/20 0618 01/26/20 0744  NA  --    < > 136 136 136 135 135  K  --    < > 3.7 3.8 4.1 4.3 4.4  CL  --    < > 102 104 103 102 102  CO2  --    < > 23 22 22 24 25   GLUCOSE  --    < > 96 88 97 87 91  BUN  --    < > 6* 6* 7* 10 11  CREATININE  --    < > 1.00 0.93 0.95 0.86 0.97  CALCIUM  --    < > 9.4 9.6 9.2 9.2 9.4  MG 1.8  --  1.7 2.0  --  1.8 1.8  PHOS 2.0*  --   --   --   --   --   --    < > = values in this interval not displayed.    GFR: Estimated Creatinine Clearance: 81.9 mL/min (by C-G formula based on SCr of 0.97 mg/dL).  Liver Function Tests: Recent Labs  Lab 01/22/20 0147 01/23/20 0120 01/24/20 0410 01/25/20 0618 01/26/20 0744  AST 121* 227* 355* 357* 305*  ALT 42 91* 200* 284* 298*  ALKPHOS 83 88 75 85 93  BILITOT 1.2 1.1 1.1 1.0 0.8  PROT 6.7 6.8 6.6 6.9 7.3  ALBUMIN 3.2* 3.2* 3.2* 3.2* 3.3*   No results for input(s): LIPASE, AMYLASE  in the last 168 hours. Recent Labs  Lab 01/21/20 1012 01/22/20 0827 01/24/20 1119 01/25/20 0618 01/26/20 0744  AMMONIA 38* 45* 52* 50* 50*    Coagulation Profile: Recent Labs  Lab 01/26/20 0744  INR 1.0    Cardiac Enzymes: No results for input(s): CKTOTAL, CKMB, CKMBINDEX, TROPONINI in the last  168 hours.  BNP (last 3 results) No results for input(s): PROBNP in the last 8760 hours.  Lipid Profile: No results for input(s): CHOL, HDL, LDLCALC, TRIG, CHOLHDL, LDLDIRECT in the last 72 hours.  Thyroid Function Tests: No results for input(s): TSH, T4TOTAL, FREET4, T3FREE, THYROIDAB in the last 72 hours.  Anemia Panel: No results for input(s): VITAMINB12, FOLATE, FERRITIN, TIBC, IRON, RETICCTPCT in the last 72 hours.  Urine analysis:    Component Value Date/Time   COLORURINE YELLOW 01/19/2020 2351   APPEARANCEUR CLEAR 01/19/2020 2351   LABSPEC 1.012 01/19/2020 2351   PHURINE 6.0 01/19/2020 2351   GLUCOSEU NEGATIVE 01/19/2020 2351   HGBUR SMALL (A) 01/19/2020 2351   BILIRUBINUR NEGATIVE 01/19/2020 2351   KETONESUR 5 (A) 01/19/2020 2351   PROTEINUR 100 (A) 01/19/2020 2351   NITRITE NEGATIVE 01/19/2020 2351   LEUKOCYTESUR NEGATIVE 01/19/2020 2351    Sepsis Labs: Lactic Acid, Venous No results found for: LATICACIDVEN  MICROBIOLOGY: Recent Results (from the past 240 hour(s))  Respiratory Panel by RT PCR (Flu A&B, Covid) - Nasopharyngeal Swab     Status: None   Collection Time: 01/20/20  2:29 AM   Specimen: Nasopharyngeal Swab  Result Value Ref Range Status   SARS Coronavirus 2 by RT PCR NEGATIVE NEGATIVE Final    Comment: (NOTE) SARS-CoV-2 target nucleic acids are NOT DETECTED.  The SARS-CoV-2 RNA is generally detectable in upper respiratoy specimens during the acute phase of infection. The lowest concentration of SARS-CoV-2 viral copies this assay can detect is 131 copies/mL. A negative result does not preclude SARS-Cov-2 infection and should not be used as the  sole basis for treatment or other patient management decisions. A negative result may occur with  improper specimen collection/handling, submission of specimen other than nasopharyngeal swab, presence of viral mutation(s) within the areas targeted by this assay, and inadequate number of viral copies (<131 copies/mL). A negative result must be combined with clinical observations, patient history, and epidemiological information. The expected result is Negative.  Fact Sheet for Patients:  https://www.moore.com/https://www.fda.gov/media/142436/download  Fact Sheet for Healthcare Providers:  https://www.young.biz/https://www.fda.gov/media/142435/download  This test is no t yet approved or cleared by the [place] FDA and  has been authorized for detection and/or diagnosis of SARS-CoV-2 by FDA under an Emergency Use Authorization (EUA). This EUA will remain  in effect (meaning this test can be used) for the duration of the COVID-19 declaration under Section 564(b)(1) of the Act, 21 U.S.C. section 360bbb-3(b)(1), unless the authorization is terminated or revoked sooner.     Influenza A by PCR NEGATIVE NEGATIVE Final   Influenza B by PCR NEGATIVE NEGATIVE Final    Comment: (NOTE) The Xpert Xpress SARS-CoV-2/FLU/RSV assay is intended as an aid in  the diagnosis of influenza from Nasopharyngeal swab specimens and  should not be used as a sole basis for treatment. Nasal washings and  aspirates are unacceptable for Xpert Xpress SARS-CoV-2/FLU/RSV  testing.  Fact Sheet for Patients: https://www.moore.com/https://www.fda.gov/media/142436/download  Fact Sheet for Healthcare Providers: https://www.young.biz/https://www.fda.gov/media/142435/download  This test is not yet approved or cleared by the [place] FDA and  has been authorized for detection and/or diagnosis of SARS-CoV-2 by  FDA under an Emergency Use Authorization (EUA). This EUA will remain  in effect (meaning this test can be used) for the duration of the  Covid-19 declaration under Section 564(b)(1) of  the Act, 21  U.S.C. section 360bbb-3(b)(1), unless the authorization is  terminated or revoked. Performed at [facility], [address] [place], [place] [address]  SARS Coronavirus 2 by RT PCR (hospital order, performed in [facility] hospital lab) Nasopharyngeal Nasopharyngeal Swab     Status: None   Collection Time: 01/26/20 11:47 AM   Specimen: Nasopharyngeal Swab  Result Value Ref Range Status   SARS Coronavirus 2 NEGATIVE NEGATIVE Final    Comment: (NOTE) SARS-CoV-2 target nucleic acids are NOT DETECTED.  The SARS-CoV-2 RNA is generally detectable in upper and lower respiratory specimens during the acute phase of infection. The lowest concentration of SARS-CoV-2 viral copies this assay can detect is 250 copies / mL. A negative result does not preclude SARS-CoV-2 infection and should not be used as the sole basis for treatment or other patient management decisions.  A negative result may occur with improper specimen collection / handling, submission of specimen other than nasopharyngeal swab, presence of viral mutation(s) within the areas targeted by this assay, and inadequate number of viral copies (<250 copies / mL). A negative result must be combined with clinical observations, patient history, and epidemiological information.  Fact Sheet for Patients:   BoilerBrush.com.cy  Fact Sheet for Healthcare Providers: https://pope.com/  This test is not yet approved or  cleared by the [place] FDA and has been authorized for detection and/or diagnosis of SARS-CoV-2 by FDA under an Emergency Use Authorization (EUA).  This EUA will remain in effect (meaning this test can be used) for the duration of the COVID-19 declaration under Section 564(b)(1) of the Act, 21 U.S.C. section 360bbb-3(b)(1), unless the authorization is terminated or revoked sooner.  Performed at [facility], [address], [place],  [place] [address]     RADIOLOGY STUDIES/RESULTS: No results found.   LOS: 5 days   Jeoffrey Massed, MD  [facility]    To contact the attending provider between 7A-7P or the covering provider during after hours 7P-7A, please log into the web site [email or web address] and access using universal [place] password for that web site. If you do not have the password, please call the hospital operator.  01/26/2020, 2:15 PM

## 2020-01-26 NOTE — Plan of Care (Signed)
  Problem: Education: Goal: Expressions of having a comfortable level of knowledge regarding the disease process will increase Outcome: Progressing   Problem: Coping: Goal: Ability to adjust to condition or change in health will improve Outcome: Progressing Goal: Ability to identify appropriate support needs will improve Outcome: Progressing   Problem: Health Behavior/Discharge Planning: Goal: Compliance with prescribed medication regimen will improve Outcome: Progressing   Problem: Medication: Goal: Risk for medication side effects will decrease Outcome: Progressing   Problem: Clinical Measurements: Goal: Complications related to the disease process, condition or treatment will be avoided or minimized Outcome: Progressing Goal: Diagnostic test results will improve Outcome: Progressing   Problem: Safety: Goal: Verbalization of understanding the information provided will improve Outcome: Progressing   Problem: Self-Concept: Goal: Level of anxiety will decrease Outcome: Progressing Goal: Ability to verbalize feelings about condition will improve Outcome: Progressing   Problem: Education: Goal: Knowledge of General Education information will improve Description: Including pain rating scale, medication(s)/side effects and non-pharmacologic comfort measures Outcome: Progressing   Problem: Health Behavior/Discharge Planning: Goal: Ability to manage health-related needs will improve Outcome: Progressing   Problem: Clinical Measurements: Goal: Ability to maintain clinical measurements within normal limits will improve Outcome: Progressing Goal: Will remain free from infection Outcome: Progressing Goal: Diagnostic test results will improve Outcome: Progressing Goal: Respiratory complications will improve Outcome: Progressing Goal: Cardiovascular complication will be avoided Outcome: Progressing   Problem: Nutrition: Goal: Adequate nutrition will be maintained Outcome:  Progressing   Problem: Coping: Goal: Level of anxiety will decrease Outcome: Progressing   Problem: Elimination: Goal: Will not experience complications related to bowel motility Outcome: Progressing Goal: Will not experience complications related to urinary retention Outcome: Progressing   Problem: Pain Managment: Goal: General experience of comfort will improve Outcome: Progressing   Problem: Safety: Goal: Ability to remain free from injury will improve Outcome: Progressing   Problem: Skin Integrity: Goal: Risk for impaired skin integrity will decrease Outcome: Progressing   Problem: Education: Goal: Knowledge of disease or condition will improve Outcome: Progressing Goal: Understanding of discharge needs will improve Outcome: Progressing   Problem: Health Behavior/Discharge Planning: Goal: Ability to identify changes in lifestyle to reduce recurrence of condition will improve Outcome: Progressing Goal: Identification of resources available to assist in meeting health care needs will improve Outcome: Progressing   Problem: Physical Regulation: Goal: Complications related to the disease process, condition or treatment will be avoided or minimized Outcome: Progressing   Problem: Safety: Goal: Ability to remain free from injury will improve Outcome: Progressing   Problem: Education: Goal: Knowledge of disease or condition will improve Outcome: Progressing Goal: Knowledge of secondary prevention will improve Outcome: Progressing Goal: Knowledge of patient specific risk factors addressed and post discharge goals established will improve Outcome: Progressing   Problem: Coping: Goal: Will verbalize positive feelings about self Outcome: Progressing Goal: Will identify appropriate support needs Outcome: Progressing   Problem: Health Behavior/Discharge Planning: Goal: Ability to manage health-related needs will improve Outcome: Progressing

## 2020-01-26 NOTE — TOC Progression Note (Addendum)
Transition of Care [facility]) - Progression Note    Patient Details  Name: Dominic Carter MRN: [id number] Date of Birth: 1955-07-30  [facility] CM/SW Contact  Kermit Balo, RN Phone Number: 01/26/2020, 10:40 AM  Clinical Narrative:    Pt doesn't have any bed offers but does have some pending. CM called pts daughter [name] and provided her the pending offers. She has selected either [address] or [address]. CM has asked [facility] to please review his information. Pt states he has received 2 covid vaccines. TOC following.  1143: [name] is not able to offer a bed. [name] is able to offer. CM has sent in information for auth through [place] for [facility].  1330: resources for inpatient/ outpatient alcohol counseling provided to the patient.  Expected Discharge Plan: Skilled Nursing Facility Barriers to Discharge: Continued Medical Work up  Expected Discharge Plan and Services Expected Discharge Plan: Skilled Nursing Facility In-house Referral: Clinical Social Work Discharge Planning Services: CM Consult Post Acute Care Choice: Skilled Nursing Facility Living arrangements for the past 2 months: Apartment                                       Social Determinants of Health (SDOH) Interventions    Readmission Risk Interventions No flowsheet data found.

## 2020-01-27 DIAGNOSIS — I10 Essential (primary) hypertension: Secondary | ICD-10-CM | POA: Diagnosis not present

## 2020-01-27 DIAGNOSIS — Z7289 Other problems related to lifestyle: Secondary | ICD-10-CM | POA: Diagnosis not present

## 2020-01-27 DIAGNOSIS — F1010 Alcohol abuse, uncomplicated: Secondary | ICD-10-CM | POA: Diagnosis not present

## 2020-01-27 LAB — COMPREHENSIVE METABOLIC PANEL WITH GFR
ALT: 256 U/L — ABNORMAL HIGH (ref 0–44)
AST: 201 U/L — ABNORMAL HIGH (ref 15–41)
Albumin: 3.1 g/dL — ABNORMAL LOW (ref 3.5–5.0)
Alkaline Phosphatase: 81 U/L (ref 38–126)
Anion gap: 8 (ref 5–15)
BUN: 13 mg/dL (ref 8–23)
CO2: 25 mmol/L (ref 22–32)
Calcium: 9.2 mg/dL (ref 8.9–10.3)
Chloride: 103 mmol/L (ref 98–111)
Creatinine, Ser: 0.97 mg/dL (ref 0.61–1.24)
GFR, Estimated: >60 mL/min
Glucose, Bld: 97 mg/dL (ref 70–99)
Potassium: 4.2 mmol/L (ref 3.5–5.1)
Sodium: 136 mmol/L (ref 135–145)
Total Bilirubin: 0.5 mg/dL (ref 0.3–1.2)
Total Protein: 7.1 g/dL (ref 6.5–8.1)

## 2020-01-27 LAB — CBC
HCT: 40.2 % (ref 39.0–52.0)
Hemoglobin: 13.3 g/dL (ref 13.0–17.0)
MCH: 31.5 pg (ref 26.0–34.0)
MCHC: 33.1 g/dL (ref 30.0–36.0)
MCV: 95.3 fL (ref 80.0–100.0)
Platelets: 187 K/uL (ref 150–400)
RBC: 4.22 MIL/uL (ref 4.22–5.81)
RDW: 12.9 % (ref 11.5–15.5)
WBC: 5.9 K/uL (ref 4.0–10.5)
nRBC: 0 % (ref 0.0–0.2)

## 2020-01-27 LAB — MAGNESIUM: Magnesium: 1.8 mg/dL (ref 1.7–2.4)

## 2020-01-27 LAB — AMMONIA: Ammonia: 47 umol/L — ABNORMAL HIGH (ref 9–35)

## 2020-01-27 MED ORDER — FOLIC ACID 1 MG PO TABS: 1.0000 mg | ORAL_TABLET | Freq: Every day | ORAL | Status: DC

## 2020-01-27 MED ORDER — THIAMINE HCL 100 MG PO TABS: 100.0000 mg | ORAL_TABLET | Freq: Every day | ORAL | Status: DC

## 2020-01-27 MED ORDER — NICOTINE 21 MG/24HR TD PT24: 21.0000 mg | MEDICATED_PATCH | Freq: Every day | TRANSDERMAL | 0 refills | Status: DC

## 2020-01-27 MED ORDER — ADULT MULTIVITAMIN W/MINERALS CH
1.0000 | ORAL_TABLET | Freq: Every day | ORAL | Status: DC
Start: 2020-01-27 — End: 2020-09-06

## 2020-01-27 MED ORDER — CHLORDIAZEPOXIDE HCL 5 MG PO CAPS
5.0000 mg | ORAL_CAPSULE | Freq: Two times a day (BID) | ORAL | Status: DC
  Administered 2020-01-27: 5 mg via ORAL
  Filled 2020-01-27: qty 1

## 2020-01-27 MED ORDER — LEVETIRACETAM 500 MG PO TABS
500.0000 mg | ORAL_TABLET | Freq: Two times a day (BID) | ORAL | Status: DC
Start: 2020-01-27 — End: 2020-02-09

## 2020-01-27 MED ORDER — ASPIRIN 81 MG PO TBEC
81.0000 mg | DELAYED_RELEASE_TABLET | Freq: Every day | ORAL | 11 refills | Status: DC
Start: 2020-01-27 — End: 2022-01-10

## 2020-01-27 NOTE — Discharge Summary (Signed)
PATIENT DETAILS Name: Dominic Carter Age: 64 y.o. Sex: male Date of Birth: 03/23/1956 MRN: [id number]. Admitting Physician: Hillary Bow, DO WUJ:[id number], No Pcp Per  Admit Date: 01/19/2020 Discharge date: 01/27/2020  Recommendations for Outpatient Follow-up:  1. Follow up with PCP in 1-2 weeks 2. Please obtain CMP/CBC in one week 3. Please repeat LFTs in 1 week 4. Please ensure follow-up with neurology in 1 month 5. Please resume statins when LFTs are more acceptable  Admitted From:  Home  Disposition: SNF   Home Health: No  Equipment/Devices: None  Discharge Condition: Stable  CODE STATUS: FULL CODE  Diet recommendation:  Diet Order            Diet - low sodium heart healthy           Diet Heart Room service appropriate? No; Fluid consistency: Thin  Diet effective now                  Brief Narrative: Patient is a 63 y.o. male with history of right BKA, VTE, HTN, HLD-alcohol abuse-who presented to the ED after he had episode of seizure at home.  Further hospital course complicated by alcohol withdrawal, and EtOH hepatitis.  Significant events: 10/14>> admit to [facility] for seizure activity.  Significant studies: 10/14>> chest x-ray: No active disease 10/14>> CT head: No acute intracranial abnormality 10/15>> MRI brain: No acute intracranial abnormality, remote posterior right territory MCA infarct. 10/15>> MRI brain: No new proximal intracranial vessel occlusion-chronic right vertebral artery occlusion 10/15>> Echo: EF 40%-anterior septum, mid inferoseptal segment and basal inferoseptal segment hypokinetic 10/15>> carotid ultrasound: No significant stenosis 10/15>> A1c: 5.4 10/16>> RUQ ultrasound: Fatty infiltration of liver-no acute findings.  Antimicrobial therapy: None  Microbiology data: None  Procedures : 10/15 >>EEG: No seizure or epileptiform discharge, study suggestive of cortical dysfunction in the right temporal  region  Consults: Neurology  Brief Hospital Course: Seizure disorder: No further seizures since admission-evaluated by neurology-started on Keppra.  No driving and other seizure restrictions on discharge.  EtOH withdrawal: Significantly better-relatively awake and alert this morning-treated with tapering Librium and Ativan per CIWA protocol.    EtOH hepatitis: LFTs seems to have plateaued-and now are trending down-INR within normal limits.  Please repeat LFTs in 1 week.    HTN: Controlled-continue amlodipine  Right BKA  History of CVA: Remains on aspirin-no longer on statins due to hepatitis.  Please resume statins when able.  Discharge Diagnoses:  Principal Problem:   Seizure (HCC) Active Problems:   ETOH abuse   HTN (hypertension)   Chronic ischemic right MCA stroke   Seizures [facility])   Discharge Instructions:  Activity:  As tolerated with Full fall precautions use walker/cane & assistance as needed  Discharge Instructions    Ambulatory referral to Neurology   Complete by: As directed    An appointment is requested in approximately: 4 weeks   Call MD for:  difficulty breathing, headache or visual disturbances   Complete by: As directed    Call MD for:  extreme fatigue   Complete by: As directed    Diet - low sodium heart healthy   Complete by: As directed    Discharge instructions   Complete by: As directed    Follow with Primary MD  in 1-2 weeks  Please get a complete blood count and chemistry panel checked by your Primary MD at your next visit, and again as instructed by your Primary MD.  Get Medicines reviewed and adjusted: Please take  all your medications with you for your next visit with your Primary MD  Laboratory/radiological data: Please request your Primary MD to go over all hospital tests and procedure/radiological results at the follow up, please ask your Primary MD to get all Hospital records sent to his/her office.  In some cases, they will be  blood work, cultures and biopsy results pending at the time of your discharge. Please request that your primary care M.D. follows up on these results.  Also Note the following: If you experience worsening of your admission symptoms, develop shortness of breath, life threatening emergency, suicidal or homicidal thoughts you must seek medical attention immediately by calling 911 or calling your MD immediately  if symptoms less severe.  You must read complete instructions/literature along with all the possible adverse reactions/side effects for all the Medicines you take and that have been prescribed to you. Take any new Medicines after you have completely understood and accpet all the possible adverse reactions/side effects.   Do not drive when taking Pain medications or sleeping medications (Benzodaizepines)  Do not take more than prescribed Pain, Sleep and Anxiety Medications. It is not advisable to combine anxiety,sleep and pain medications without talking with your primary care practitioner  Special Instructions: If you have smoked or chewed Tobacco  in the last 2 yrs please stop smoking, stop any regular Alcohol  and or any Recreational drug use.  Wear Seat belts while driving.  Please note: You were cared for by a hospitalist during your hospital stay. Once you are discharged, your primary care physician will handle any further medical issues. Please note that NO REFILLS for any discharge medications will be authorized once you are discharged, as it is imperative that you return to your primary care physician (or establish a relationship with a primary care physician if you do not have one) for your post hospital discharge needs so that they can reassess your need for medications and monitor your lab values.   Seizure precautions: Per [facility] statutes, patients with seizures are not allowed to drive until they have been seizure-free for six months and cleared by a physician   Use  caution when using heavy equipment or power tools. Avoid working on ladders or at heights. Take showers instead of baths. Ensure the water temperature is not too high on the home water heater. Do not go swimming alone. Do not lock yourself in a room alone (i.e. bathroom). When caring for infants or small children, sit down when holding, feeding, or changing them to minimize risk of injury to the child in the event you have a seizure. Maintain good sleep hygiene. Avoid alcohol.   If patienthas another seizure, call 911 and bring them back to the ED if: A. The seizure lasts longer than 5 minutes.  B. The patient doesn't wake shortly after the seizure or has new problems such as difficulty seeing, speaking or moving following the seizure C. The patient was injured during the seizure D. The patient has a temperature over 102 F (39C) E. The patient vomited during the seizure and now is having trouble breathing  During the Seizure  - First, ensure adequate ventilation and place patients on the floor on their left side  Loosen clothing around the neck and ensure the airway is patent. If the patient is clenching the teeth, do not force the mouth open with any object as this can cause severe damage - Remove all items from the surrounding that can be hazardous. The  patient may be oblivious to what's happening and may not even know what he or she is doing. If the patient is confused and wandering, either gently guide him/her away and block access to outside areas - Reassure the individual and be comforting - Call 911. In most cases, the seizure ends before EMS arrives. However, there are cases when seizures may last over 3 to 5 minutes. Or the individual may have developed breathing difficulties or severe injuries. If a pregnant patient or a person with diabetes develops a seizure, it is prudent to call an ambulance. - Finally, if the patient does not regain full consciousness, then call EMS.  Most patients will remain confused for about 45 to 90 minutes after a seizure, so you must use judgment in calling for help. - Avoid restraints but make sure the patient is in a bed with padded side rails - Place the individual in a lateral position with the neck slightly flexed; this will help the saliva drain from the mouth and prevent the tongue from falling backward - Remove all nearby furniture and other hazards from the area - Provide verbal assurance as the individual is regaining consciousness - Provide the patient with privacy if possible - Call for help and start treatment as ordered by the caregiver  After the Seizure (Postictal Stage)  After a seizure, most patients experience confusion, fatigue, muscle pain and/or a headache. Thus, one should permit the individual to sleep. For the next few days, reassurance is essential. Being calm and helping reorient the person is also of importance.  Most seizures are painless and end spontaneously. Seizures are not harmful to others but can lead to complications such as stress on the lungs, brain and the heart. Individuals with prior lung problems may develop labored breathing and respiratory distress.   Increase activity slowly   Complete by: As directed      Allergies as of 01/27/2020   No Known Allergies     Medication List    STOP taking these medications   cyclobenzaprine 10 MG tablet Commonly known as: FLEXERIL   methocarbamol 500 MG tablet Commonly known as: ROBAXIN   naproxen 500 MG tablet Commonly known as: NAPROSYN   traMADol 50 MG tablet Commonly known as: ULTRAM     TAKE these medications   amLODipine 10 MG tablet Commonly known as: NORVASC Take 10 mg by mouth daily.   aspirin 81 MG EC tablet Take 1 tablet (81 mg total) by mouth daily. Swallow whole.   folic acid 1 MG tablet Commonly known as: FOLVITE Take 1 tablet (1 mg total) by mouth daily.   levETIRAcetam 500 MG tablet Commonly known as:  KEPPRA Take 1 tablet (500 mg total) by mouth 2 (two) times daily.   multivitamin with minerals Tabs tablet Take 1 tablet by mouth daily.   nicotine 21 mg/24hr patch Commonly known as: NICODERM CQ - dosed in mg/24 hours Place 1 patch (21 mg total) onto the skin daily.   thiamine 100 MG tablet Take 1 tablet (100 mg total) by mouth daily.       Contact information for after-discharge care    Destination    [facility] Preferred SNF .   Service: Skilled Nursing Contact information: [address] [place] [address] [phone]                 No Known Allergies     Other Procedures/Studies: EEG  Result Date: 01/20/2020 Charlsie Quest, MD  01/20/2020  9:52 AM Patient Name: DIDIER BRANDENBURG MRN: [id number] Epilepsy Attending: Charlsie Quest Referring Physician/Provider: Ritta Slot Date: 01/20/2020 Duration: 26.32 mins Patient history:  64 year old male with a previous right temporal infarct (apparently unknown to patient) who presents with two seizures. EEG to evaluate for seizure. Level of alertness: Awake, asleep AEDs during EEG study: LEV Technical aspects: This EEG study was done with scalp electrodes positioned according to the 10-20 International system of electrode placement. Electrical activity was acquired at a sampling rate of  and reviewed with a high frequency filter of  and a low frequency filter of . EEG data were recorded continuously and digitally stored. Description: The posterior dominant rhythm consists of 9 Hz activity of moderate voltage (25-35 uV) seen predominantly in posterior head regions, symmetric and reactive to eye opening and eye closing. Sleep was characterized by vertex waves, sleep spindles (12 to 14 Hz), maximal frontocentral region.  EEG showed continuous 3 to 6 Hz theta-delta slowing in right temporal region.  Hyperventilation and photic stimulation were not performed.    ABNORMALITY -Continuous slow, right temporal region IMPRESSION: This study is suggestive of cortical dysfunction in right temporal region consistent with underlying stroke.  No seizures or epileptiform discharges were seen throughout the recording. Charlsie Quest   DG Abd 1 View  Result Date: 01/20/2020 CLINICAL DATA:  Seizures EXAM: ABDOMEN - 1 VIEW COMPARISON:  None. FINDINGS: Normal abdominal gas pattern. No organomegaly. Vascular stents are seen within the right common iliac artery and terminal aorta. No unexpected metallic foreign body within the visualized abdomen. IMPRESSION: No unexpected metallic foreign body within the visualized abdomen. Electronically Signed   By: Helyn Numbers MD   On: 01/20/2020 03:26   CT Head Wo Contrast  Result Date: 01/19/2020 CLINICAL DATA:  64 year old male status post unwitnessed seizure. EXAM: CT HEAD WITHOUT CONTRAST TECHNIQUE: Contiguous axial images were obtained from the base of the skull through the vertex without intravenous contrast. COMPARISON:  Head CT, CTA head and neck 03/02/2016 FINDINGS: Brain: Moderate sized chronic infarct with encephalomalacia in the posterior right MCA territory, new since 2017. Mild ex vacuo ventricular enlargement. Progressed bilateral cerebral white matter hypodensity also since 2017. But otherwise stable gray-white matter differentiation. No midline shift, ventriculomegaly, mass effect, evidence of mass lesion, intracranial hemorrhage or evidence of cortically based acute infarction. Gray-white matter differentiation is within normal limits throughout the brain. Vascular: Calcified atherosclerosis at the skull base. No suspicious intracranial vascular hyperdensity. Skull: Stable, intact. Sinuses/Orbits: Progressed chronic sinus disease since 2017, most affecting the sphenoid and right maxillary sinuses. Tympanic cavities and mastoids remain clear. Other: No acute orbit or scalp soft tissue injury. IMPRESSION: 1. No acute  intracranial abnormality or acute traumatic injury identified. 2. Chronic posterior right MCA territory infarct, new since 2017. 3. Progressed chronic paranasal sinus disease. Electronically Signed   By: Odessa Fleming M.D.   On: 01/19/2020 23:13   MR ANGIO HEAD WO CONTRAST  Result Date: 01/20/2020 CLINICAL DATA:  Stroke, follow-up; seizure EXAM: MRA HEAD WITHOUT CONTRAST TECHNIQUE: Angiographic images of the Circle of Willis were obtained using MRA technique without intravenous contrast. COMPARISON:  CT 11/25/2015 FINDINGS: Intracranial internal carotid arteries are patent. Middle and anterior cerebral arteries are patent. Congenitally absent right A1 ACA. There is absent visualization of the proximal intracranial right vertebral artery. Reconstitution at the level of the PICA origin probably via retrograde flow. This was present on 2017 CTA. Intracranial left right vertebral artery, basilar artery, posterior cerebral arteries are  patent. Large bilateral posterior communicating arteries are present with fetal or near fetal origin of the posterior cerebral arteries. IMPRESSION: No new proximal intracranial vessel occlusion. 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 Electronically Signed   By: Guadlupe Spanish M.D.   On: 01/20/2020 11:04   MR BRAIN WO CONTRAST  Result Date: 01/20/2020 CLINICAL DATA:  Stroke, follow-up.  Seizure. EXAM: MRI HEAD WITHOUT CONTRAST TECHNIQUE: Multiplanar, multiecho pulse sequences of the brain and surrounding structures were obtained without intravenous contrast. The examination had to be discontinued prior to completion due to patient would not tolerate additional sequences. COMPARISON:  CT head without contrast 01/19/2020 FINDINGS: Brain: Remote posterior right MCA territory infarct is confirmed. Ex vacuo dilation of the right lateral ventricle is present. Periventricular white matter changes are moderately  advanced for age. Asymmetric changes are present in the right corona radiata. No acute infarct, hemorrhage, or mass lesion is present. The ventricles are proportionate to the degree of atrophy. No significant extraaxial fluid collection is present. Brainstem demonstrates wallerian changes along the right cortical spinal tracts. Remote lacunar infarcts are present within the cerebellum bilaterally. Vascular: Flow is present in the major intracranial arteries. Skull and upper cervical spine: The craniocervical junction is normal. Upper cervical spine is within normal limits. Marrow signal is unremarkable. Sinuses/Orbits: Right maxillary bilateral posterior ethmoid and sphenoid sinus disease is chronic. The globes and orbits are within normal limits. Mastoid air cells are clear. IMPRESSION: 1. No acute intracranial abnormality. 2. Remote posterior right MCA territory infarct. 3. Remote lacunar infarcts of the cerebellum bilaterally. 4. Atrophy and white matter disease is moderately advanced for age. This likely reflects the sequela of chronic microvascular ischemia. 5. 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 Electronically Signed   By: Marin Roberts M.D.   On: 01/20/2020 05:09   DG Chest Portable 1 View  Result Date: 01/19/2020 CLINICAL DATA:  Fall, altered mental status, seizure EXAM: PORTABLE CHEST 1 VIEW COMPARISON:  None. FINDINGS: The heart size and mediastinal contours are within normal limits. Both lungs are clear. The visualized skeletal structures are unremarkable. IMPRESSION: No active disease. Electronically Signed   By: Helyn Numbers MD   On: 01/19/2020 23:10   ECHOCARDIOGRAM COMPLETE  Result Date: 01/20/2020    ECHOCARDIOGRAM REPORT   Patient Name:   THEDFORD BUNTON Date of Exam: 01/20/2020 Medical Rec #:  [id number]         Height:       71.0 in Accession #:    [id number]        Weight:       170.0 lb Date of Birth:  1955/12/07          BSA:           1.968 m Patient Age:    64 years          BP:           151/80 mmHg Patient Gender: M                 HR:           80 bpm. Exam Location:  Inpatient Procedure: 2D Echo and Intracardiac Opacification Agent Indications:    Stroke 434.91 / I163.9  History:        Patient has no prior history of Echocardiogram examinations.  Risk Factors:Hypertension and Dyslipidemia. Past history of DVT.                 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  Sonographer:    Leta Jungling RDCS Referring Phys: [phone] JARED M GARDNER  Sonographer Comments: Technically difficult study due to poor echo windows. Imaging difficult due to patients altered mental status. IMPRESSIONS  1. Left ventricular ejection fraction, by estimation, is 40%. The left ventricle has mildly decreased function. The left ventricle demonstrates regional wall motion abnormalities (see scoring diagram/findings for description). There is mild left ventricular hypertrophy. Left ventricular diastolic parameters are consistent with Grade I diastolic dysfunction (impaired relaxation).  2. Right ventricular systolic function is mildly reduced. The right ventricular size is normal. Tricuspid regurgitation signal is inadequate for assessing PA pressure.  3. The mitral valve is normal in structure. No evidence of mitral valve regurgitation. No evidence of mitral stenosis.  4. The aortic valve is grossly normal. Aortic valve regurgitation is mild. No aortic stenosis is present.  5. The inferior vena cava is normal in size with greater than 50% respiratory variability, suggesting right atrial pressure of 3 mmHg. Conclusion(s)/Recommendation(s): No intracardiac source of embolism detected on this transthoracic study. A transesophageal echocardiogram is recommended to exclude cardiac source of embolism if clinically indicated. No left ventricular mural or apical thrombus/thrombi. FINDINGS  Left Ventricle: Left ventricular ejection fraction, by estimation, is 40%. The left  ventricle has mildly decreased function. The left ventricle demonstrates regional wall motion abnormalities. Definity contrast agent was given IV to delineate the left ventricular endocardial borders. The left ventricular internal cavity size was normal in size. There is mild left ventricular hypertrophy. Left ventricular diastolic parameters are consistent with Grade I diastolic dysfunction (impaired relaxation).  LV Wall Scoring: The anterior septum, mid inferoseptal segment, and basal inferoseptal segment are hypokinetic. Right Ventricle: The right ventricular size is normal. No increase in right ventricular wall thickness. Right ventricular systolic function is mildly reduced. Tricuspid regurgitation signal is inadequate for assessing PA pressure. Left Atrium: Left atrial size was normal in size. Right Atrium: Right atrial size was normal in size. Pericardium: There is no evidence of pericardial effusion. Mitral Valve: The mitral valve is normal in structure. No evidence of mitral valve regurgitation. No evidence of mitral valve stenosis. Tricuspid Valve: The tricuspid valve is normal in structure. Tricuspid valve regurgitation is not demonstrated. No evidence of tricuspid stenosis. Aortic Valve: The aortic valve is grossly normal. Aortic valve regurgitation is mild. No aortic stenosis is present. Pulmonic Valve: The pulmonic valve was normal in structure. Pulmonic valve regurgitation is trivial. No evidence of pulmonic stenosis. Aorta: The aortic root and ascending aorta are structurally normal, with no evidence of dilitation. Venous: The inferior vena cava is normal in size with greater than 50% respiratory variability, suggesting right atrial pressure of 3 mmHg. IAS/Shunts: The interatrial septum was not well visualized.  LEFT VENTRICLE PLAX 2D LVIDd:         5.36 cm      Diastology LVIDs:         4.57 cm      LV e' medial:    5.59 cm/s LV PW:         1.04 cm      LV E/e' medial:  13.6 LV IVS:        0.93 cm       LV e' lateral:   6.89 cm/s LVOT diam:     2.30 cm  LV E/e' lateral: 11.1 LV SV:         62 LV SV Index:   32 LVOT Area:     4.15 cm  LV Volumes (MOD) LV vol d, MOD A2C: 112.0 ml LV vol d, MOD A4C: 117.0 ml LV vol s, MOD A2C: 85.3 ml LV vol s, MOD A4C: 57.5 ml LV SV MOD A2C:     26.7 ml LV SV MOD A4C:     117.0 ml LV SV MOD BP:      43.4 ml RIGHT VENTRICLE RV S prime:     11.00 cm/s TAPSE (M-mode): 2.0 cm LEFT ATRIUM             Index       RIGHT ATRIUM           Index LA diam:        2.70 cm 1.37 cm/m  RA Area:     15.10 cm LA Vol (A2C):   38.6 ml 19.62 ml/m RA Volume:   37.50 ml  19.06 ml/m LA Vol (A4C):   50.8 ml 25.82 ml/m LA Biplane Vol: 44.7 ml 22.72 ml/m  AORTIC VALVE LVOT Vmax:   100.00 cm/s LVOT Vmean:  61.200 cm/s LVOT VTI:    0.150 m  AORTA Ao Root diam: 3.70 cm MITRAL VALVE MV Area (PHT): 3.27 cm     SHUNTS MV Decel Time: 232 msec     Systemic VTI:  0.15 m MV E velocity: 76.30 cm/s   Systemic Diam: 2.30 cm MV A velocity: 111.00 cm/s MV E/A ratio:  0.69 Weston BrassGayatri Acharya MD Electronically signed by Weston BrassGayatri Acharya MD Signature Date/Time: 01/20/2020/10:49:44 AM    Final    [facility]OTID [facility] and [facility] only)  Result Date: 01/20/2020 Carotid Arterial Duplex Study Indications:       CVA. Risk Factors:      Hypertension. Limitations        Today's exam was limited due to the patient's inability or                    unwillingness to cooperate, the patient's respiratory                    variation and patient confusion, patient somnolence, patient                    movement. Comparison Study:  No prior studies. Performing Technologist: Chanda BusingGregory Collins RVT  Examination Guidelines: A complete evaluation includes B-mode imaging, spectral Doppler, color Doppler, and power Doppler as needed of all accessible portions of each vessel. Bilateral testing is considered an integral part of a complete examination. Limited examinations for reoccurring indications may be performed as noted.  Right Carotid  Findings: +----------+--------+--------+--------+-----------------------+--------+           PSV cm/sEDV cm/sStenosisPlaque Description     Comments +----------+--------+--------+--------+-----------------------+--------+ CCA Prox  87      16              smooth and heterogenous         +----------+--------+--------+--------+-----------------------+--------+ CCA Distal56      11              smooth and heterogenous         +----------+--------+--------+--------+-----------------------+--------+ ICA Prox  34      10              smooth and heterogenous         +----------+--------+--------+--------+-----------------------+--------+ ICA  Distal51      12                                     tortuous +----------+--------+--------+--------+-----------------------+--------+ ECA       84      10                                              +----------+--------+--------+--------+-----------------------+--------+ +----------+--------+-------+--------+-------------------+           PSV cm/sEDV cmsDescribeArm Pressure (mmHG) +----------+--------+-------+--------+-------------------+ [id number]                                         +----------+--------+-------+--------+-------------------+ +---------+--------+--+--------+--+---------+ VertebralPSV cm/s24EDV cm/s10Antegrade +---------+--------+--+--------+--+---------+  Left Carotid Findings: +----------+--------+--------+--------+-----------------------+--------+           PSV cm/sEDV cm/sStenosisPlaque Description     Comments +----------+--------+--------+--------+-----------------------+--------+ CCA Prox  90      20              smooth and heterogenous         +----------+--------+--------+--------+-----------------------+--------+ CCA Distal64      13              smooth and heterogenous         +----------+--------+--------+--------+-----------------------+--------+ ICA Prox  39       12              smooth and heterogenous         +----------+--------+--------+--------+-----------------------+--------+ ICA Distal52      17                                     tortuous +----------+--------+--------+--------+-----------------------+--------+ ECA       106     17                                              +----------+--------+--------+--------+-----------------------+--------+ +----------+--------+--------+--------+-------------------+           PSV cm/sEDV cm/sDescribeArm Pressure (mmHG) +----------+--------+--------+--------+-------------------+ [id number]                                         +----------+--------+--------+--------+-------------------+ +---------+--------+--+--------+--+---------+ VertebralPSV cm/s25EDV cm/s10Antegrade +---------+--------+--+--------+--+---------+   Summary: Right Carotid: Velocities in the right ICA are consistent with a 1-39% stenosis. Left Carotid: Velocities in the left ICA are consistent with a 1-39% stenosis. Vertebrals: Bilateral vertebral arteries demonstrate antegrade flow. *See table(s) above for measurements and observations.  Electronically signed by Sherald Hess MD on 01/20/2020 at 2:23:46 PM.    Final    US Abdomen Limited RUQ (LIVER/GB)  Result Date: 01/21/2020 CLINICAL DATA:  Cirrhosis EXAM: ULTRASOUND ABDOMEN LIMITED RIGHT UPPER QUADRANT COMPARISON:  None. FINDINGS: Gallbladder: No gallstones or wall thickening visualized. No sonographic Murphy sign noted by sonographer. Common bile duct: Diameter: Normal caliber, 4 mm Liver: Increased echotexture compatible with fatty infiltration. No focal abnormality or biliary ductal dilatation. Portal vein is patent on color Doppler imaging with normal direction  of blood flow towards the liver. Other: None. IMPRESSION: Fatty infiltration of the liver. No acute findings. Electronically Signed   By: Charlett Nose M.D.   On: 01/21/2020 20:01     TODAY-DAY  OF DISCHARGE:  Subjective:   Lyn Hollingshead today has no headache,no chest abdominal pain,no new weakness tingling or numbness, feels much better wants to go home today.   Objective:   Blood pressure 100/71, pulse 79, temperature 97.8 F (36.6 C), temperature source Oral, resp. rate 16, height 5\' 11"  (1.803 m), weight 77.1 kg, SpO2 100 %.  Intake/Output Summary (Last 24 hours) at 01/27/2020 1610 Last data filed at 01/26/2020 2321 Gross per 24 hour  Intake 300 ml  Output 450 ml  Net -150 ml   Filed Weights   01/19/20 2158  Weight: 77.1 kg    Exam: Awake Alert, Oriented *3, No new F.N deficits, Normal affect [place].AT,PERRAL Supple Neck,No JVD, No cervical lymphadenopathy appriciated.  Symmetrical Chest wall movement, Good air movement bilaterally, CTAB RRR,No Gallops,Rubs or new Murmurs, No Parasternal Heave +ve B.Sounds, Abd Soft, Non tender, No organomegaly appriciated, No rebound -guarding or rigidity. No Cyanosis, Clubbing or edema, No new Rash or bruise   PERTINENT RADIOLOGIC STUDIES: No results found.   PERTINENT LAB RESULTS: CBC: Recent Labs    01/26/20 0744 01/27/20 0133  WBC 5.1 5.9  HGB 14.6 13.3  HCT 44.8 40.2  PLT 167 187   CMET CMP     Component Value Date/Time   NA 136 01/27/2020 0133   NA 142 07/29/2011 1632   K 4.2 01/27/2020 0133   K 3.7 07/29/2011 1632   CL 103 01/27/2020 0133   CL 106 07/29/2011 1632   CO2 25 01/27/2020 0133   CO2 27 07/29/2011 1632   GLUCOSE 97 01/27/2020 0133   GLUCOSE 108 (H) 07/29/2011 1632   BUN 13 01/27/2020 0133   BUN 5 (L) 07/29/2011 1632   CREATININE 0.97 01/27/2020 0133   CREATININE 0.94 07/29/2011 1632   CALCIUM 9.2 01/27/2020 0133   CALCIUM 8.0 (L) 07/29/2011 1632   PROT 7.1 01/27/2020 0133   PROT 8.0 07/29/2011 1632   ALBUMIN 3.1 (L) 01/27/2020 0133   ALBUMIN 3.6 07/29/2011 1632   AST 201 (H) 01/27/2020 0133   AST 50 (H) 07/29/2011 1632   ALT 256 (H) 01/27/2020 0133   ALT 47 07/29/2011 1632    ALKPHOS 81 01/27/2020 0133   ALKPHOS 102 07/29/2011 1632   BILITOT 0.5 01/27/2020 0133   BILITOT 0.1 (L) 07/29/2011 1632   GFRNONAA >60 01/27/2020 0133   GFRNONAA >60 07/29/2011 1632   GFRAA >60 10/09/2019 1919   GFRAA >60 07/29/2011 1632    GFR Estimated Creatinine Clearance: 81.9 mL/min (by C-G formula based on SCr of 0.97 mg/dL). No results for input(s): LIPASE, AMYLASE in the last 72 hours. No results for input(s): CKTOTAL, CKMB, CKMBINDEX, TROPONINI in the last 72 hours. Invalid input(s): POCBNP No results for input(s): DDIMER in the last 72 hours. No results for input(s): HGBA1C in the last 72 hours. No results for input(s): CHOL, HDL, LDLCALC, TRIG, CHOLHDL, LDLDIRECT in the last 72 hours. No results for input(s): TSH, T4TOTAL, T3FREE, THYROIDAB in the last 72 hours.  Invalid input(s): FREET3 No results for input(s): VITAMINB12, FOLATE, FERRITIN, TIBC, IRON, RETICCTPCT in the last 72 hours. Coags: Recent Labs    01/26/20 0744  INR 1.0   Microbiology: Recent Results (from the past 240 hour(s))  Respiratory Panel by RT PCR (Flu A&B, Covid) - Nasopharyngeal Swab  Status: None   Collection Time: 01/20/20  2:29 AM   Specimen: Nasopharyngeal Swab  Result Value Ref Range Status   SARS Coronavirus 2 by RT PCR NEGATIVE NEGATIVE Final    Comment: (NOTE) SARS-CoV-2 target nucleic acids are NOT DETECTED.  The SARS-CoV-2 RNA is generally detectable in upper respiratoy specimens during the acute phase of infection. The lowest concentration of SARS-CoV-2 viral copies this assay can detect is 131 copies/mL. A negative result does not preclude SARS-Cov-2 infection and should not be used as the sole basis for treatment or other patient management decisions. A negative result may occur with  improper specimen collection/handling, submission of specimen other than nasopharyngeal swab, presence of viral mutation(s) within the areas targeted by this assay, and inadequate number of  viral copies (<131 copies/mL). A negative result must be combined with clinical observations, patient history, and epidemiological information. The expected result is Negative.  Fact Sheet for Patients:  https://www.moore.com/  Fact Sheet for Healthcare Providers:  https://www.young.biz/  This test is no t yet approved or cleared by the [place] FDA and  has been authorized for detection and/or diagnosis of SARS-CoV-2 by FDA under an Emergency Use Authorization (EUA). This EUA will remain  in effect (meaning this test can be used) for the duration of the COVID-19 declaration under Section 564(b)(1) of the Act, 21 U.S.C. section 360bbb-3(b)(1), unless the authorization is terminated or revoked sooner.     Influenza A by PCR NEGATIVE NEGATIVE Final   Influenza B by PCR NEGATIVE NEGATIVE Final    Comment: (NOTE) The Xpert Xpress SARS-CoV-2/FLU/RSV assay is intended as an aid in  the diagnosis of influenza from Nasopharyngeal swab specimens and  should not be used as a sole basis for treatment. Nasal washings and  aspirates are unacceptable for Xpert Xpress SARS-CoV-2/FLU/RSV  testing.  Fact Sheet for Patients: https://www.moore.com/  Fact Sheet for Healthcare Providers: https://www.young.biz/  This test is not yet approved or cleared by the [place] FDA and  has been authorized for detection and/or diagnosis of SARS-CoV-2 by  FDA under an Emergency Use Authorization (EUA). This EUA will remain  in effect (meaning this test can be used) for the duration of the  Covid-19 declaration under Section 564(b)(1) of the Act, 21  U.S.C. section 360bbb-3(b)(1), unless the authorization is  terminated or revoked. Performed at [facility], [address], [place], [place] [address]   SARS Coronavirus 2 by RT PCR (hospital order, performed in [facility] hospital lab) Nasopharyngeal Nasopharyngeal  Swab     Status: None   Collection Time: 01/26/20 11:47 AM   Specimen: Nasopharyngeal Swab  Result Value Ref Range Status   SARS Coronavirus 2 NEGATIVE NEGATIVE Final    Comment: (NOTE) SARS-CoV-2 target nucleic acids are NOT DETECTED.  The SARS-CoV-2 RNA is generally detectable in upper and lower respiratory specimens during the acute phase of infection. The lowest concentration of SARS-CoV-2 viral copies this assay can detect is 250 copies / mL. A negative result does not preclude SARS-CoV-2 infection and should not be used as the sole basis for treatment or other patient management decisions.  A negative result may occur with improper specimen collection / handling, submission of specimen other than nasopharyngeal swab, presence of viral mutation(s) within the areas targeted by this assay, and inadequate number of viral copies (<250 copies / mL). A negative result must be combined with clinical observations, patient history, and epidemiological information.  Fact Sheet for Patients:   BoilerBrush.com.cy  Fact Sheet  for Healthcare Providers: https://pope.com/  This test is not yet approved or  cleared by the [place] and has been authorized for detection and/or diagnosis of SARS-CoV-2 by FDA under an Emergency Use Authorization (EUA).  This EUA will remain in effect (meaning this test can be used) for the duration of the COVID-19 declaration under Section 564(b)(1) of the Act, 21 U.S.C. section 360bbb-3(b)(1), unless the authorization is terminated or revoked sooner.  Performed at [facility], [address], [place], [place] [address]     FURTHER DISCHARGE INSTRUCTIONS:  Get Medicines reviewed and adjusted: Please take all your medications with you for your next visit with your Primary MD  Laboratory/radiological data: Please request your Primary MD to go over all hospital tests and procedure/radiological  results at the follow up, please ask your Primary MD to get all Hospital records sent to his/her office.  In some cases, they will be blood work, cultures and biopsy results pending at the time of your discharge. Please request that your primary care M.D. goes through all the records of your hospital data and follows up on these results.  Also Note the following: If you experience worsening of your admission symptoms, develop shortness of breath, life threatening emergency, suicidal or homicidal thoughts you must seek medical attention immediately by calling 911 or calling your MD immediately  if symptoms less severe.  You must read complete instructions/literature along with all the possible adverse reactions/side effects for all the Medicines you take and that have been prescribed to you. Take any new Medicines after you have completely understood and accpet all the possible adverse reactions/side effects.   Do not drive when taking Pain medications or sleeping medications (Benzodaizepines)  Do not take more than prescribed Pain, Sleep and Anxiety Medications. It is not advisable to combine anxiety,sleep and pain medications without talking with your primary care practitioner  Special Instructions: If you have smoked or chewed Tobacco  in the last 2 yrs please stop smoking, stop any regular Alcohol  and or any Recreational drug use.  Wear Seat belts while driving.  Please note: You were cared for by a hospitalist during your hospital stay. Once you are discharged, your primary care physician will handle any further medical issues. Please note that NO REFILLS for any discharge medications will be authorized once you are discharged, as it is imperative that you return to your primary care physician (or establish a relationship with a primary care physician if you do not have one) for your post hospital discharge needs so that they can reassess your need for medications and monitor your lab  values.  Total Time spent coordinating discharge including counseling, education and face to face time equals 35 minutes.  Signed: Kely Dohn 01/27/2020 9:22 AM

## 2020-01-27 NOTE — Progress Notes (Signed)
Patient discharged via EMS, all belongings sent with patient. VS stable, PIV and tele removed.

## 2020-01-27 NOTE — Progress Notes (Signed)
  Speech Language Pathology Treatment: Cognitive-Linquistic  Patient Details Name: Dominic Carter MRN: [id number] DOB: [birth year] Today's Date: 01/27/2020 Time: 6222-9798 SLP Time Calculation (min) (ACUTE ONLY): 19 min  Assessment / Plan / Recommendation Clinical Impression  Pt was seen for cognitive-linguistic treatment and was cooperative during the session. He was oriented to person and place, but required cues for temporal orientation. He was able to recall some of the activities covered during the last session. He completed a mental manipulation sequencing task with 60% accuracy increasing to 100% with self-correction and cues. He demonstrated 67% accuracy with money calculations but expressed his difficulty was seeing the stimuli without his glasses and not with the actual calculations. He achieved 100% accuracy with simple reasoning tasks. Pt currently has discharge orders and will benefit from continued treatment at next level of care.    HPI HPI: Patient is a 64 y/o male who presents with confusion and new onset seizures. 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      SLP Plan  Continue with current plan of care       Recommendations                   Follow up Recommendations: Skilled Nursing facility SLP Visit Diagnosis: Cognitive communication deficit (X21.194) Plan: Continue with current plan of care       Dominic Varricchio I. Vear Clock, MS, CCC-SLP Acute Rehabilitation Services Office number [phone] Pager [phone]                Scheryl Marten 01/27/2020, 12:14 PM

## 2020-01-27 NOTE — TOC Transition Note (Signed)
Transition of Care [facility]) - CM/SW Discharge Note   Patient Details  Name: Dominic Carter MRN: [id number] Date of Birth: 16-Dec-1955  [facility] CM/SW Contact:  Kermit Balo, RN Phone Number: 01/27/2020, 11:56 AM   Clinical Narrative:    Pt is discharging to [facility] today. Pt will transport via [facility]. Bedside RN updated and d/c packet at the desk. Daughter to bring prosthesis to the rehab.   Room: 211 Number for report: [phone]   Final next level of care: Skilled Nursing Facility Barriers to Discharge: No Barriers Identified   Patient Goals and CMS Choice   CMS Medicare.gov Compare Post Acute Care list provided to:: Patient Represenative (must comment) Choice offered to / list presented to : Adult Children  Discharge Placement PASRR number recieved: 01/26/20            Patient chooses bed at: [facility] Patient to be transferred to facility by: [facility] Name of family member notified: Britney Patient and family notified of of transfer: 01/27/20  Discharge Plan and Services In-house Referral: Clinical Social Work Discharge Planning Services: [employer] Consult Post Acute Care Choice: Skilled Nursing Facility                               Social Determinants of Health (SDOH) Interventions     Readmission Risk Interventions No flowsheet data found.

## 2020-01-30 ENCOUNTER — Non-Acute Institutional Stay (SKILLED_NURSING_FACILITY): Payer: Medicare Other | Admitting: Internal Medicine

## 2020-01-30 ENCOUNTER — Encounter: Payer: Self-pay | Admitting: Internal Medicine

## 2020-01-30 DIAGNOSIS — K7010 Alcoholic hepatitis without ascites: Secondary | ICD-10-CM | POA: Diagnosis not present

## 2020-01-30 DIAGNOSIS — R569 Unspecified convulsions: Secondary | ICD-10-CM | POA: Diagnosis not present

## 2020-01-30 DIAGNOSIS — E785 Hyperlipidemia, unspecified: Secondary | ICD-10-CM

## 2020-01-30 DIAGNOSIS — F1010 Alcohol abuse, uncomplicated: Secondary | ICD-10-CM

## 2020-01-30 NOTE — Assessment & Plan Note (Addendum)
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

## 2020-01-30 NOTE — Assessment & Plan Note (Signed)
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

## 2020-01-30 NOTE — Progress Notes (Signed)
NURSING HOME LOCATION:  [facility] ROOM NUMBER: [id number]  CODE STATUS: Full code  PCP: patient can not remember  This is a comprehensive admission note to [facility] performed on this date less than 30 days from date of admission. Included are preadmission medical/surgical history; reconciled medication list; family history; social history and comprehensive review of systems.  Corrections and additions to the records were documented. Comprehensive physical exam was also performed. Additionally a clinical summary was entered for each active diagnosis pertinent to this admission in the Problem List to enhance continuity of care.  HPI: Patient was hospitalized 10/14-10/22/2021, admitted from home following a seizure. CT of the head revealed no acute intracranial abnormality.  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 EEG revealed no seizure activity.  Cortical dysfunction of the right temporal region was suspected. 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   Echo revealed ejection fraction of 40% with anterior septal, mid inferior septal segment and basal inferior segment hypokinesis.  Ultrasound revealed fatty infiltration of the liver. Hospital course was complicated by alcohol withdrawal and alcohol related hepatitis.  Librium and Ativan were administered as per CIWA protocol.  LFTs plateaued and were trending down toward normal predischarge. In spite of history of CVA statins were not resumed because of the alcohol-related hepatitis. He will follow with Neurology ~1 month following discharge.  Past medical and surgical history: Includes essential hypertension, dyslipidemia, and history of DVT.  Surgeries include right BKA.  Social history: History of alcohol abuse ; he states he only drinks "1 beer a day" but then explained that it was at least a 40 ounce beer. 1 pack/day  smoking history.  Family history: Extremely strong family history of hypertension.   Review of systems: Veracity of history is somewhat in question.  He gave the date as January 31, 2020 or 2022.  He could not name his primary care physician.  When asked why he was in the hospital he stated that he had fallen and hit his head.  There was no cardiac or neurologic prodrome prior to the fall. He states that the prosthesis at the right BKA is unstable and will sometimes come off or give way leading to the fall.  He states that he had the BKA because of a blood clot had led to gangrene. He states he does have a cough with sputum production.  He is unable to quantitate how much sputum he produces.  He states that he coughs because of " inhaling so much air with no teeth".  Constitutional: No fever, significant weight change, fatigue  Eyes: No redness, discharge, pain, vision change ENT/mouth: No nasal congestion, purulent discharge, earache, change in hearing, sore throat  Cardiovascular: No chest pain, palpitations, paroxysmal nocturnal dyspnea, claudication, edema  Respiratory: No hemoptysis, significant snoring, apnea Gastrointestinal: No heartburn, dysphagia, abdominal pain, nausea /vomiting, rectal bleeding, melena, change in bowels Genitourinary: No dysuria, hematuria, pyuria, incontinence, nocturia Musculoskeletal: No joint stiffness, joint swelling,  pain Dermatologic: No rash, pruritus, change in appearance of skin Neurologic: No dizziness, headache, syncope, numbness, tingling Psychiatric: No significant anxiety, depression, insomnia, anorexia Endocrine: No change in hair/skin/nails, excessive thirst, excessive hunger, excessive urination  Hematologic/lymphatic: No significant bruising, lymphadenopathy, abnormal bleeding Allergy/immunology: No itchy/watery eyes, significant sneezing, urticaria, angioedema  Physical exam:  Pertinent or positive findings: Uriniferous odor noted @ bedside.He  appears chronically ill and suboptimally nourished.  He has a mustache and beard.  Arcus senilis  is present.  Sclera are muddy to almot jaundiced.  There is alternating exotropia of each eye.  He is completely edentulous.  Speech is slurred and difficult to discern.  He has low-grade expiratory rhonchi.  Heart sounds are somewhat distant.  Systolic murmur is suggested.  Second heart sound is increased.  R BKA present. Left dorsalis pedis pulses are decreased.  Clubbing of the nailbeds are present.  General appearance: no acute distress, increased work of breathing is present.   Lymphatic: No lymphadenopathy about the head, neck, axilla. Eyes: No conjunctival inflammation or lid edema is present.  Ears:  External ear exam shows no significant lesions or deformities.   Nose:  External nasal examination shows no deformity or inflammation. Nasal mucosa are pink and moist without lesions, exudates Oral exam:There is no oropharyngeal erythema or exudate. Neck:  No thyromegaly, masses, tenderness noted.    Heart:  No gallop, murmur, click, rub.  Lungs:  without wheezes, rales, rubs. Abdomen: Bowel sounds are normal.  Abdomen is soft and nontender with no organomegaly, hernias, masses. GU: Deferred  Extremities:  No cyanosis,edema. Neurologic exam: Balance, Rhomberg, finger to nose testing could not be completed due to clinical state Skin: Warm & dry w/o tenting. No significant lesions or rash.  See clinical summary under each active problem in the Problem List with associated updated therapeutic plan

## 2020-01-30 NOTE — Patient Instructions (Signed)
See assessment and plan under each diagnosis in the problem list and acutely for this visit 

## 2020-01-31 ENCOUNTER — Encounter: Payer: Self-pay | Admitting: Internal Medicine

## 2020-01-31 NOTE — Progress Notes (Signed)
This encounter was created in error - please disregard.

## 2020-01-31 NOTE — Assessment & Plan Note (Signed)
No clinical withdrawal symptoms @ SNF

## 2020-01-31 NOTE — Progress Notes (Deleted)
   NURSING HOME LOCATION:  [facility] ROOM NUMBER:  [id number]  CODE STATUS:  FULL CODE  PCP:  Patient, No Pcp Per    This is a comprehensive admission note to [facility] performed on this date less than 30 days from date of admission. Included are preadmission medical/surgical history; reconciled medication list; family history; social history and comprehensive review of systems.  Corrections and additions to the records were documented. Comprehensive physical exam was also performed. Additionally a clinical summary was entered for each active diagnosis pertinent to this admission in the Problem List to enhance continuity of care.  HPI:  Past medical and surgical history:  Social history:  Family history:   Review of systems:  Could not be completed due to dementia. Date given as Constitutional: No fever, significant weight change, fatigue  Eyes: No redness, discharge, pain, vision change ENT/mouth: No nasal congestion, purulent discharge, earache, change in hearing, sore throat  Cardiovascular: No chest pain, palpitations, paroxysmal nocturnal dyspnea, claudication, edema  Respiratory: No cough, sputum production, hemoptysis, DOE, significant snoring, apnea Gastrointestinal: No heartburn, dysphagia, abdominal pain, nausea /vomiting, rectal bleeding, melena, change in bowels Genitourinary: No dysuria, hematuria, pyuria, incontinence, nocturia Musculoskeletal: No joint stiffness, joint swelling, weakness, pain Dermatologic: No rash, pruritus, change in appearance of skin Neurologic: No dizziness, headache, syncope, seizures, numbness, tingling Psychiatric: No significant anxiety, depression, insomnia, anorexia Endocrine: No change in hair/skin/nails, excessive thirst, excessive hunger, excessive urination  Hematologic/lymphatic: No significant bruising, lymphadenopathy, abnormal bleeding Allergy/immunology: No itchy/watery eyes, significant sneezing, urticaria,  angioedema  Physical exam:  Pertinent or positive findings: General appearance: Adequately nourished; no acute distress, increased work of breathing is present.   Lymphatic: No lymphadenopathy about the head, neck, axilla. Eyes: No conjunctival inflammation or lid edema is present. There is no scleral icterus. Ears:  External ear exam shows no significant lesions or deformities.   Nose:  External nasal examination shows no deformity or inflammation. Nasal mucosa are pink and moist without lesions, exudates Oral exam: Lips and gums are healthy appearing.There is no oropharyngeal erythema or exudate. Neck:  No thyromegaly, masses, tenderness noted.    Heart:  Normal rate and regular rhythm. S1 and S2 normal without gallop, murmur, click, rub.  Lungs: Chest clear to auscultation without wheezes, rhonchi, rales, rubs. Abdomen: Bowel sounds are normal.  Abdomen is soft and nontender with no organomegaly, hernias, masses. GU: Deferred  Extremities:  No cyanosis, clubbing, edema. Neurologic exam:  Strength equal  in upper & lower extremities. Balance, Rhomberg, finger to nose testing could not be completed due to clinical state Deep tendon reflexes are equal Skin: Warm & dry w/o tenting. No significant lesions or rash.  See clinical summary under each active problem in the Problem List with associated updated therapeutic plan

## 2020-01-31 NOTE — Assessment & Plan Note (Signed)
No seizures reported @ SNF, Neurology F/U in ~ 1 month.

## 2020-02-03 LAB — BASIC METABOLIC PANEL
BUN: 8 (ref 4–21)
CO2: 24 — AB (ref 13–22)
Chloride: 104 (ref 99–108)
Creatinine: 0.8 (ref 0.6–1.3)
Glucose: 78
Potassium: 4.4 (ref 3.4–5.3)
Sodium: 141 (ref 137–147)

## 2020-02-03 LAB — HEPATIC FUNCTION PANEL
ALT: 127 — AB (ref 10–40)
AST: 90 — AB (ref 14–40)
Alkaline Phosphatase: 104 (ref 25–125)
Bilirubin, Total: 0.4

## 2020-02-03 LAB — CBC: RBC: 4.54 (ref 3.87–5.11)

## 2020-02-03 LAB — CBC AND DIFFERENTIAL
HCT: 44 (ref 41–53)
Hemoglobin: 14.1 (ref 13.5–17.5)
Neutrophils Absolute: 3.1
Platelets: 332 (ref 150–399)
WBC: 6.3

## 2020-02-03 LAB — COMPREHENSIVE METABOLIC PANEL
Albumin: 4 (ref 3.5–5.0)
Calcium: 9.7 (ref 8.7–10.7)
GFR calc Af Amer: 90
GFR calc non Af Amer: 90
Globulin: 3

## 2020-02-07 ENCOUNTER — Encounter: Payer: Self-pay | Admitting: Adult Health

## 2020-02-07 ENCOUNTER — Non-Acute Institutional Stay (SKILLED_NURSING_FACILITY): Payer: Medicare Other | Admitting: Adult Health

## 2020-02-07 DIAGNOSIS — I10 Essential (primary) hypertension: Secondary | ICD-10-CM

## 2020-02-07 DIAGNOSIS — R569 Unspecified convulsions: Secondary | ICD-10-CM | POA: Diagnosis not present

## 2020-02-07 DIAGNOSIS — F1010 Alcohol abuse, uncomplicated: Secondary | ICD-10-CM

## 2020-02-07 LAB — HEPATITIS B SURFACE ANTIGEN: Hepatitis B Surface Ag: NONREACTIVE

## 2020-02-07 NOTE — Progress Notes (Signed)
Location:  [facility] Room Number: 211-A Place of Service:  SNF (31) Provider:  Kenard Gower, DNP, FNP-BC  Patient Care Team: Patient, No Pcp Per as PCP - General (General Practice)  Extended Emergency Contact Information Primary Emergency Contact: Johnson,Brittany Address: [address]          [place], [place] [address] [place] Home Phone: [phone] Relation: Daughter  Code Status:  FULL CODE  Goals of care: Advanced Directive information Advanced Directives 05/04/2017  Does Patient Have a Medical Advance Directive? No  Would patient like information on creating a medical advance directive? No - Patient declined     Chief Complaint  Patient presents with  . Medical Management of Chronic Issues    Short-term rehabilitation visit    HPI:  Pt is a 64 y.o. male seen today for medical management of chronic diseases. He is a short-term rehabilitation resident of [facility]. He has a PMH of essential hypertension, dyslipidemia and history of DVT. He was seen in the room today. He is currently having PT, OT and ST. No reported seizures. SBPs ranging from 129-150.  He takes Norvasc 10 mg 1 tab daily for hypertension.  He was admitted to [facility] on 01/27/20 post [facility] hospitalization 01/19/20 to 01/27/20 for seizure disorder. He presented to the ED after seizure at home. Per roommates/significant other, he was standing in the doorway when he started trembling all over and unable to speak. EMS was called and brought him to ED. He apparently drinks ETOH daily. 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 This is new since 2017 (when he last had a CT head). He was seen by neurology and was started on Keppra. ETOH withdrawal was treated with tapering Librium and Ativan.   Past Medical History:  Diagnosis Date  . DVT (deep venous thrombosis) (HCC)   . Dyslipidemia   . ETOH  abuse   . Hx of BKA, right (HCC)   . Hypertension   . Seizure disorder [facility])    Past Surgical History:  Procedure Laterality Date  . BELOW KNEE LEG AMPUTATION Right     No Known Allergies  Outpatient Encounter Medications as of 02/07/2020  Medication Sig  . amLODipine (NORVASC) 10 MG tablet Take 10 mg by mouth daily.  Marland Kitchen aspirin EC 81 MG EC tablet Take 1 tablet (81 mg total) by mouth daily. Swallow whole.  . folic acid (FOLVITE) 1 MG tablet Take 1 tablet (1 mg total) by mouth daily.  Marland Kitchen levETIRAcetam (KEPPRA) 500 MG tablet Take 1 tablet (500 mg total) by mouth 2 (two) times daily.  . Multiple Vitamin (MULTIVITAMIN WITH MINERALS) TABS tablet Take 1 tablet by mouth daily.  . nicotine (NICODERM CQ - DOSED IN MG/24 HOURS) 21 mg/24hr patch Place 1 patch (21 mg total) onto the skin daily.  Marland Kitchen thiamine 100 MG tablet Take 1 tablet (100 mg total) by mouth daily.   No facility-administered encounter medications on file as of 02/07/2020.    Review of Systems  GENERAL: No change in appetite, no fatigue, no weight changes, no fever, chills or weakness MOUTH and THROAT: Denies oral discomfort, gingival pain or bleeding, pain from teeth or hoarseness   RESPIRATORY: no cough, SOB, DOE, wheezing, hemoptysis CARDIAC: No chest pain, edema or palpitations GI: No abdominal pain, diarrhea, constipation, heart burn, nausea or vomiting GU: Denies dysuria, frequency, hematuria, incontinence, or discharge NEUROLOGICAL: Denies dizziness, syncope, numbness, or headache PSYCHIATRIC: Denies  feelings of depression or anxiety. No report of hallucinations, insomnia, paranoia, or agitation   Immunization History  Administered Date(s) Administered  . Influenza,inj,Quad PF,6+ Mos 01/10/2016  . Influenza-Unspecified 01/10/2016  . PFIZER SARS-COV-2 Vaccination 07/05/2019, 08/22/2019  . Td 07/07/2010   Pertinent  Health Maintenance Due  Topic Date Due  . COLONOSCOPY  Never done  . INFLUENZA VACCINE  11/06/2019     Vitals:   02/07/20 1152  BP: 129/80  Pulse: 72  Resp: 18  Temp: 97.7 F (36.5 C)  TempSrc: Oral  SpO2: 90%  Weight: 139 lb 3.2 oz (63.1 kg)  Height:  (1.803 m)   Body mass index is 19.41 kg/m.  Physical Exam  GENERAL APPEARANCE:  In no acute distress.  SKIN:  Skin is warm and dry.  MOUTH and THROAT: Lips are without lesions. Oral mucosa is moist and without lesions.  RESPIRATORY: Breathing is even & unlabored, BS CTAB CARDIAC: RRR, no murmur,no extra heart sounds, no edema GI: Abdomen soft, normal BS, no masses, no tenderness EXTREMITIES: Right BKA with prosthesis. NEUROLOGICAL: There is no tremor. Speech is clear. PSYCHIATRIC: Affect and behavior are appropriate.  Labs reviewed: Recent Labs    01/20/20 0057 01/21/20 0428 01/25/20 0618 01/25/20 0618 01/26/20 0744 01/27/20 0133 02/03/20 0000  NA  --    < > 135   < > 135 136 141  K  --    < > 4.3   < > 4.4 4.2 4.4  CL  --    < > 102   < > 102 103 104  CO2  --    < > 24   < > 25 25 24*  GLUCOSE  --    < > 87  --  91 97  --   BUN  --    < > 10   < > CREATININE  --    < > 0.86   < > 0.97 0.97 0.8  CALCIUM  --    < > 9.2   < > 9.4 9.2 9.7  MG 1.8   < > 1.8  --  1.8 1.8  --   PHOS 2.0*  --   --   --   --   --   --    < > = values in this interval not displayed.   Recent Labs    01/25/20 0618 01/25/20 0618 01/26/20 0744 01/27/20 0133 02/03/20 0000  AST 357*   < > 305* 201* 90*  ALT 284*   < > 298* 256* 127*  ALKPHOS 85   < > 93 81 104  BILITOT 1.0  --  0.8 0.5  --   PROT 6.9  --  7.3 7.1  --   ALBUMIN 3.2*   < > 3.3* 3.1* 4.0   < > = values in this interval not displayed.   Recent Labs    01/24/20 0410 01/24/20 0410 01/25/20 0618 01/25/20 0618 01/26/20 0744 01/27/20 0133 02/03/20 0000  WBC 4.8   < > 4.3   < > 5.1 5.9 6.3  NEUTROABS 2.8  --  2.3  --   --   --  3.10  HGB 13.8   < > 14.4   < > 14.6 13.3 14.1  HCT 41.1   < > 43.6   < > 44.8 40.2 44  MCV 92.2   < > 95.2  --  95.1  95.3  --   PLT 130*   < >  150   < > 167 187 332   < > = values in this interval not displayed.   Lab Results  Component Value Date   TSH 1.12 07/29/2011   Lab Results  Component Value Date   HGBA1C 5.4 01/20/2020   Lab Results  Component Value Date   CHOL 219 (H) 01/20/2020   HDL 73 01/20/2020   LDLCALC 129 (H) 01/20/2020   TRIG 85 01/20/2020   CHOLHDL 3.0 01/20/2020    Significant Diagnostic Results in last 30 days:  EEG  Result Date: 01/20/2020 Charlsie Quest, MD     01/20/2020  9:52 AM Patient Name: BRYOR RAMI MRN: [id number] Epilepsy Attending: Charlsie Quest Referring Physician/Provider: Ritta Slot Date: 01/20/2020 Duration: 26.32 mins Patient history:  64 year old male with a previous right temporal infarct (apparently unknown to patient) who presents with two seizures. EEG to evaluate for seizure. Level of alertness: Awake, asleep AEDs during EEG study: LEV Technical aspects: This EEG study was done with scalp electrodes positioned according to the 10-20 International system of electrode placement. Electrical activity was acquired at a sampling rate of 500Hz  and reviewed with a high frequency filter of 70Hz  and a low frequency filter of 1Hz . EEG data were recorded continuously and digitally stored. Description: The posterior dominant rhythm consists of 9 Hz activity of moderate voltage (25-35 uV) seen predominantly in posterior head regions, symmetric and reactive to eye opening and eye closing. Sleep was characterized by vertex waves, sleep spindles (12 to 14 Hz), maximal frontocentral region.  EEG showed continuous 3 to 6 Hz theta-delta slowing in right temporal region.  Hyperventilation and photic stimulation were not performed.   ABNORMALITY -Continuous slow, right temporal region IMPRESSION: This study is suggestive of cortical dysfunction in right temporal region consistent with underlying stroke.  No seizures or epileptiform discharges were seen throughout  the recording. Charlsie Quest   DG Abd 1 View  Result Date: 01/20/2020 CLINICAL DATA:  Seizures EXAM: ABDOMEN - 1 VIEW COMPARISON:  None. FINDINGS: Normal abdominal gas pattern. No organomegaly. Vascular stents are seen within the right common iliac artery and terminal aorta. No unexpected metallic foreign body within the visualized abdomen. IMPRESSION: No unexpected metallic foreign body within the visualized abdomen. Electronically Signed   By: Helyn Numbers MD   On: 01/20/2020 03:26   CT Head Wo Contrast  Result Date: 01/19/2020 CLINICAL DATA:  64 year old male status post unwitnessed seizure. EXAM: CT HEAD WITHOUT CONTRAST TECHNIQUE: Contiguous axial images were obtained from the base of the skull through the vertex without intravenous contrast. COMPARISON:  Head CT, CTA head and neck 03/02/2016 FINDINGS: Brain: Moderate sized chronic infarct with encephalomalacia in the posterior right MCA territory, new since 2017. Mild ex vacuo ventricular enlargement. Progressed bilateral cerebral white matter hypodensity also since 2017. But otherwise stable gray-white matter differentiation. No midline shift, ventriculomegaly, mass effect, evidence of mass lesion, intracranial hemorrhage or evidence of cortically based acute infarction. Gray-white matter differentiation is within normal limits throughout the brain. Vascular: Calcified atherosclerosis at the skull base. No suspicious intracranial vascular hyperdensity. Skull: Stable, intact. Sinuses/Orbits: Progressed chronic sinus disease since 2017, most affecting the sphenoid and right maxillary sinuses. Tympanic cavities and mastoids remain clear. Other: No acute orbit or scalp soft tissue injury. IMPRESSION: 1. No acute intracranial abnormality or acute traumatic injury identified. 2. Chronic posterior right MCA territory infarct, new since 2017. 3. Progressed chronic paranasal sinus disease. Electronically Signed   By: Odessa Fleming M.D.   On:  01/19/2020 23:13    MR ANGIO HEAD WO CONTRAST  Result Date: 01/20/2020 CLINICAL DATA:  Stroke, follow-up; seizure EXAM: MRA HEAD WITHOUT CONTRAST TECHNIQUE: Angiographic images of the Circle of Willis were obtained using MRA technique without intravenous contrast. COMPARISON:  CT 11/25/2015 FINDINGS: Intracranial internal carotid arteries are patent. Middle and anterior cerebral arteries are patent. Congenitally absent right A1 ACA. There is absent visualization of the proximal intracranial right vertebral artery. Reconstitution at the level of the PICA origin probably via retrograde flow. This was present on 2017 CTA. Intracranial left right vertebral artery, basilar artery, posterior cerebral arteries are patent. Large bilateral posterior communicating arteries are present with fetal or near fetal origin of the posterior cerebral arteries. IMPRESSION: No new proximal intracranial vessel occlusion. 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 Electronically Signed   By: Guadlupe SpanishPraneil  Patel M.D.   On: 01/20/2020 11:04   MR BRAIN WO CONTRAST  Result Date: 01/20/2020 CLINICAL DATA:  Stroke, follow-up.  Seizure. EXAM: MRI HEAD WITHOUT CONTRAST TECHNIQUE: Multiplanar, multiecho pulse sequences of the brain and surrounding structures were obtained without intravenous contrast. The examination had to be discontinued prior to completion due to patient would not tolerate additional sequences. COMPARISON:  CT head without contrast 01/19/2020 FINDINGS: Brain: Remote posterior right MCA territory infarct is confirmed. Ex vacuo dilation of the right lateral ventricle is present. Periventricular white matter changes are moderately advanced for age. Asymmetric changes are present in the right corona radiata. No acute infarct, hemorrhage, or mass lesion is present. The ventricles are proportionate to the degree of atrophy. No significant extraaxial fluid collection is  present. Brainstem demonstrates wallerian changes along the right cortical spinal tracts. Remote lacunar infarcts are present within the cerebellum bilaterally. Vascular: Flow is present in the major intracranial arteries. Skull and upper cervical spine: The craniocervical junction is normal. Upper cervical spine is within normal limits. Marrow signal is unremarkable. Sinuses/Orbits: Right maxillary bilateral posterior ethmoid and sphenoid sinus disease is chronic. The globes and orbits are within normal limits. Mastoid air cells are clear. IMPRESSION: 1. No acute intracranial abnormality. 2. Remote posterior right MCA territory infarct. 3. Remote lacunar infarcts of the cerebellum bilaterally. 4. Atrophy and white matter disease is moderately advanced for age. This likely reflects the sequela of chronic microvascular ischemia. 5. 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 Electronically Signed   By: Marin Robertshristopher  Mattern M.D.   On: 01/20/2020 05:09   DG Chest Portable 1 View  Result Date: 01/19/2020 CLINICAL DATA:  Fall, altered mental status, seizure EXAM: PORTABLE CHEST 1 VIEW COMPARISON:  None. FINDINGS: The heart size and mediastinal contours are within normal limits. Both lungs are clear. The visualized skeletal structures are unremarkable. IMPRESSION: No active disease. Electronically Signed   By: Helyn NumbersAshesh  Parikh MD   On: 01/19/2020 23:10   ECHOCARDIOGRAM COMPLETE  Result Date: 01/20/2020    ECHOCARDIOGRAM REPORT   Patient Name:   Levan HurstRICHARD K Loa Date of Exam: 01/20/2020 Medical Rec #:  [id number]         Height:       71.0 in Accession #:    [id number]        Weight:       170.0 lb Date of Birth:  10/29/1955          BSA:          1.968 m Patient Age:    [age] years  BP:           151/80 mmHg Patient Gender: M                 HR:           80 bpm. Exam Location:  Inpatient Procedure: 2D Echo and Intracardiac Opacification Agent Indications:    Stroke 434.91 /  I163.9  History:        Patient has no prior history of Echocardiogram examinations.                 Risk Factors:Hypertension and Dyslipidemia. Past history of DVT.                 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  Sonographer:    Leta Jungling RDCS Referring Phys: [phone] JARED M GARDNER  Sonographer Comments: Technically difficult study due to poor echo windows. Imaging difficult due to patients altered mental status. IMPRESSIONS  1. Left ventricular ejection fraction, by estimation, is 40%. The left ventricle has mildly decreased function. The left ventricle demonstrates regional wall motion abnormalities (see scoring diagram/findings for description). There is mild left ventricular hypertrophy. Left ventricular diastolic parameters are consistent with Grade I diastolic dysfunction (impaired relaxation).  2. Right ventricular systolic function is mildly reduced. The right ventricular size is normal. Tricuspid regurgitation signal is inadequate for assessing PA pressure.  3. The mitral valve is normal in structure. No evidence of mitral valve regurgitation. No evidence of mitral stenosis.  4. The aortic valve is grossly normal. Aortic valve regurgitation is mild. No aortic stenosis is present.  5. The inferior vena cava is normal in size with greater than 50% respiratory variability, suggesting right atrial pressure of 3 mmHg. Conclusion(s)/Recommendation(s): No intracardiac source of embolism detected on this transthoracic study. A transesophageal echocardiogram is recommended to exclude cardiac source of embolism if clinically indicated. No left ventricular mural or apical thrombus/thrombi. FINDINGS  Left Ventricle: Left ventricular ejection fraction, by estimation, is 40%. The left ventricle has mildly decreased function. The left ventricle demonstrates regional wall motion abnormalities. Definity contrast agent was given IV to delineate the left ventricular endocardial borders. The left ventricular internal cavity size  was normal in size. There is mild left ventricular hypertrophy. Left ventricular diastolic parameters are consistent with Grade I diastolic dysfunction (impaired relaxation).  LV Wall Scoring: The anterior septum, mid inferoseptal segment, and basal inferoseptal segment are hypokinetic. Right Ventricle: The right ventricular size is normal. No increase in right ventricular wall thickness. Right ventricular systolic function is mildly reduced. Tricuspid regurgitation signal is inadequate for assessing PA pressure. Left Atrium: Left atrial size was normal in size. Right Atrium: Right atrial size was normal in size. Pericardium: There is no evidence of pericardial effusion. Mitral Valve: The mitral valve is normal in structure. No evidence of mitral valve regurgitation. No evidence of mitral valve stenosis. Tricuspid Valve: The tricuspid valve is normal in structure. Tricuspid valve regurgitation is not demonstrated. No evidence of tricuspid stenosis. Aortic Valve: The aortic valve is grossly normal. Aortic valve regurgitation is mild. No aortic stenosis is present. Pulmonic Valve: The pulmonic valve was normal in structure. Pulmonic valve regurgitation is trivial. No evidence of pulmonic stenosis. Aorta: The aortic root and ascending aorta are structurally normal, with no evidence of dilitation. Venous: The inferior vena cava is normal in size with greater than 50% respiratory variability, suggesting right atrial pressure of 3 mmHg. IAS/Shunts: The interatrial septum was not well visualized.  LEFT VENTRICLE PLAX 2D LVIDd:  5.36 cm      Diastology LVIDs:         4.57 cm      LV e' medial:    5.59 cm/s LV PW:         1.04 cm      LV E/e' medial:  13.6 LV IVS:        0.93 cm      LV e' lateral:   6.89 cm/s LVOT diam:     2.30 cm      LV E/e' lateral: 11.1 LV SV:         62 LV SV Index:   32 LVOT Area:     4.15 cm  LV Volumes (MOD) LV vol d, MOD A2C: 112.0 ml LV vol d, MOD A4C: 117.0 ml LV vol s, MOD A2C: 85.3 ml  LV vol s, MOD A4C: 57.5 ml LV SV MOD A2C:     26.7 ml LV SV MOD A4C:     117.0 ml LV SV MOD BP:      43.4 ml RIGHT VENTRICLE RV S prime:     11.00 cm/s TAPSE (M-mode): 2.0 cm LEFT ATRIUM             Index       RIGHT ATRIUM           Index LA diam:        2.70 cm 1.37 cm/m  RA Area:     15.10 cm LA Vol (A2C):   38.6 ml 19.62 ml/m RA Volume:   37.50 ml  19.06 ml/m LA Vol (A4C):   50.8 ml 25.82 ml/m LA Biplane Vol: 44.7 ml 22.72 ml/m  AORTIC VALVE LVOT Vmax:   100.00 cm/s LVOT Vmean:  61.200 cm/s LVOT VTI:    0.150 m  AORTA Ao Root diam: 3.70 cm MITRAL VALVE MV Area (PHT): 3.27 cm     SHUNTS MV Decel Time: 232 msec     Systemic VTI:  0.15 m MV E velocity: 76.30 cm/s   Systemic Diam: 2.30 cm MV A velocity: 111.00 cm/s MV E/A ratio:  0.69 Weston Brass MD Electronically signed by Weston Brass MD Signature Date/Time: 01/20/2020/10:49:44 AM    Final    VAS US CAROTID (at [facility] and [facility] only)  Result Date: 01/20/2020 Carotid Arterial Duplex Study Indications:       CVA. Risk Factors:      Hypertension. Limitations        Today's exam was limited due to the patient's inability or                    unwillingness to cooperate, the patient's respiratory                    variation and patient confusion, patient somnolence, patient                    movement. Comparison Study:  No prior studies. Performing Technologist: Chanda Busing RVT  Examination Guidelines: A complete evaluation includes B-mode imaging, spectral Doppler, color Doppler, and power Doppler as needed of all accessible portions of each vessel. Bilateral testing is considered an integral part of a complete examination. Limited examinations for reoccurring indications may be performed as noted.  Right Carotid Findings: +----------+--------+--------+--------+-----------------------+--------+           PSV cm/sEDV cm/sStenosisPlaque Description     Comments +----------+--------+--------+--------+-----------------------+--------+ CCA Prox   87      16  smooth and heterogenous         +----------+--------+--------+--------+-----------------------+--------+ CCA Distal56      11              smooth and heterogenous         +----------+--------+--------+--------+-----------------------+--------+ ICA Prox  34      10              smooth and heterogenous         +----------+--------+--------+--------+-----------------------+--------+ ICA Distal51      12                                     tortuous +----------+--------+--------+--------+-----------------------+--------+ ECA       84      10                                              +----------+--------+--------+--------+-----------------------+--------+ +----------+--------+-------+--------+-------------------+           PSV cm/sEDV cmsDescribeArm Pressure (mmHG) +----------+--------+-------+--------+-------------------+ [id number]                                         +----------+--------+-------+--------+-------------------+ +---------+--------+--+--------+--+---------+ VertebralPSV cm/s24EDV cm/s10Antegrade +---------+--------+--+--------+--+---------+  Left Carotid Findings: +----------+--------+--------+--------+-----------------------+--------+           PSV cm/sEDV cm/sStenosisPlaque Description     Comments +----------+--------+--------+--------+-----------------------+--------+ CCA Prox  90      20              smooth and heterogenous         +----------+--------+--------+--------+-----------------------+--------+ CCA Distal64      13              smooth and heterogenous         +----------+--------+--------+--------+-----------------------+--------+ ICA Prox  39      12              smooth and heterogenous         +----------+--------+--------+--------+-----------------------+--------+ ICA Distal52      17                                     tortuous  +----------+--------+--------+--------+-----------------------+--------+ ECA       106     17                                              +----------+--------+--------+--------+-----------------------+--------+ +----------+--------+--------+--------+-------------------+           PSV cm/sEDV cm/sDescribeArm Pressure (mmHG) +----------+--------+--------+--------+-------------------+ [id number]                                         +----------+--------+--------+--------+-------------------+ +---------+--------+--+--------+--+---------+ VertebralPSV cm/s25EDV cm/s10Antegrade +---------+--------+--+--------+--+---------+   Summary: Right Carotid: Velocities in the right ICA are consistent with a 1-39% stenosis. Left Carotid: Velocities in the left ICA are consistent with a 1-39% stenosis. Vertebrals: Bilateral vertebral arteries demonstrate antegrade flow. *See table(s) above for measurements and observations.  Electronically signed by Sherald Hess MD on 01/20/2020  at 2:23:46 PM.    Final    US Abdomen Limited RUQ (LIVER/GB)  Result Date: 01/21/2020 CLINICAL DATA:  Cirrhosis EXAM: ULTRASOUND ABDOMEN LIMITED RIGHT UPPER QUADRANT COMPARISON:  None. FINDINGS: Gallbladder: No gallstones or wall thickening visualized. No sonographic Murphy sign noted by sonographer. Common bile duct: Diameter: Normal caliber, 4 mm Liver: Increased echotexture compatible with fatty infiltration. No focal abnormality or biliary ductal dilatation. Portal vein is patent on color Doppler imaging with normal direction of blood flow towards the liver. Other: None. IMPRESSION: Fatty infiltration of the liver. No acute findings. Electronically Signed   By: Charlett Nose M.D.   On: 01/21/2020 20:01    Assessment/Plan  1. Primary hypertension -Stable, continue Norvasc  2. Seizure (HCC) -No seizures reported, continue Keppra  3. ETOH abuse -Continue thiamine and folic acid     Family/ staff  Communication: Discussed plan of care with resident and charge nurse.  Labs/tests ordered: None  Goals of care:   Short-term care  Kenard Gower, DNP, MSN, FNP-BC [facility] [phone] (Monday-Friday 8:00 a.m. - 5:00 p.m.) [phone] (after hours)

## 2020-02-08 ENCOUNTER — Encounter: Payer: Self-pay | Admitting: Adult Health

## 2020-02-09 ENCOUNTER — Non-Acute Institutional Stay (SKILLED_NURSING_FACILITY): Payer: Medicare Other | Admitting: Adult Health

## 2020-02-09 ENCOUNTER — Encounter: Payer: Self-pay | Admitting: Adult Health

## 2020-02-09 DIAGNOSIS — F1010 Alcohol abuse, uncomplicated: Secondary | ICD-10-CM

## 2020-02-09 DIAGNOSIS — R569 Unspecified convulsions: Secondary | ICD-10-CM | POA: Diagnosis not present

## 2020-02-09 DIAGNOSIS — I10 Essential (primary) hypertension: Secondary | ICD-10-CM

## 2020-02-09 DIAGNOSIS — Z720 Tobacco use: Secondary | ICD-10-CM | POA: Diagnosis not present

## 2020-02-09 MED ORDER — THIAMINE HCL 100 MG PO TABS: 100.0000 mg | ORAL_TABLET | Freq: Every day | ORAL | 0 refills | Status: DC

## 2020-02-09 MED ORDER — NICOTINE 21 MG/24HR TD PT24: 21.0000 mg | MEDICATED_PATCH | Freq: Every day | TRANSDERMAL | 0 refills | Status: DC

## 2020-02-09 MED ORDER — FOLIC ACID 1 MG PO TABS: 1.0000 mg | ORAL_TABLET | Freq: Every day | ORAL | 0 refills | Status: DC

## 2020-02-09 MED ORDER — LEVETIRACETAM 500 MG PO TABS: 500.0000 mg | ORAL_TABLET | Freq: Two times a day (BID) | ORAL | 0 refills | Status: DC

## 2020-02-09 MED ORDER — AMLODIPINE BESYLATE 10 MG PO TABS: 10.0000 mg | ORAL_TABLET | Freq: Every day | ORAL | 0 refills | Status: DC

## 2020-02-09 NOTE — Progress Notes (Signed)
Location:  [facility] Room Number: 211-A Place of Service:  SNF (31) Provider:  Kenard Gower, DNP, FNP-BC  Patient Care Team: Patient, No Pcp Per as PCP - General (General Practice)  Extended Emergency Contact Information Primary Emergency Contact: Johnson,Brittany Address: [address]          [place], [place] [address] [place] Home Phone: [phone] Relation: Daughter  Code Status:  FULL CODE  Goals of care: Advanced Directive information Advanced Directives 05/04/2017  Does Patient Have a Medical Advance Directive? No  Would patient like information on creating a medical advance directive? No - Patient declined     Chief Complaint  Patient presents with  . Discharge Note    Patient is seen for discharge from SNF on 02/10/20    HPI:  Pt is a 64 y.o. male who is for discharge home with Home health PT and OT.  He was admitted to [facility] on 01/27/2020 post [facility] hospitalization 01/19/2020 to 01/27/2020 for seizure disorder.  He presented to the ED after seizure at home.  Per roommate/significant other, he was standing in the doorway when he started trembling all over and unable to speak.  EMS was called and brought him to ED.  He apparently drinks EtOH daily.  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  This is new since 2017 (when he last had a CT head).  EEG revealed no seizure activity. He was seen by neurology and was a started on Keppra. EtOH withdrawal was treated with tapering Librium and Ativan. 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  LFTs plateaued and were trending down.  He has a PMH of DVT, dyslipidemia and essential hypertension.     Patient was admitted to this facility for short-term rehabilitation after the patient's recent hospitalization.  Patient has completed SNF rehabilitation and therapy has cleared the patient for discharge.   Past  Medical History:  Diagnosis Date  . DVT (deep venous thrombosis) (HCC)   . Dyslipidemia   . ETOH abuse   . Hx of BKA, right (HCC)   . Hypertension   . Seizure disorder [facility])    Past Surgical History:  Procedure Laterality Date  . BELOW KNEE LEG AMPUTATION Right     No Known Allergies  Outpatient Encounter Medications as of 02/09/2020  Medication Sig  . amLODipine (NORVASC) 10 MG tablet Take 10 mg by mouth daily.  Marland Kitchen aspirin EC 81 MG EC tablet Take 1 tablet (81 mg total) by mouth daily. Swallow whole.  . folic acid (FOLVITE) 1 MG tablet Take 1 tablet (1 mg total) by mouth daily.  Marland Kitchen levETIRAcetam (KEPPRA) 500 MG tablet Take 1 tablet (500 mg total) by mouth 2 (two) times daily.  . Multiple Vitamin (MULTIVITAMIN WITH MINERALS) TABS tablet Take 1 tablet by mouth daily.  . nicotine (NICODERM CQ - DOSED IN MG/24 HOURS) 21 mg/24hr patch Place 1 patch (21 mg total) onto the skin daily.  Marland Kitchen thiamine 100 MG tablet Take 1 tablet (100 mg total) by mouth daily.   No facility-administered encounter medications on file as of 02/09/2020.    Review of Systems  GENERAL: No change in appetite, no fatigue, no weight changes, no fever, chills or weakness MOUTH and THROAT: Denies oral discomfort, gingival pain or bleeding RESPIRATORY: no cough, SOB, DOE, wheezing, hemoptysis CARDIAC: No chest pain, edema or palpitations GI: No abdominal pain, diarrhea, constipation, heart burn, nausea or vomiting GU: Denies  dysuria, frequency, hematuria, incontinence, or discharge NEUROLOGICAL: Denies dizziness, syncope, numbness, or headache PSYCHIATRIC: Denies feelings of depression or anxiety. No report of hallucinations, insomnia, paranoia, or agitation   Immunization History  Administered Date(s) Administered  . Influenza,inj,Quad PF,6+ Mos 01/10/2016  . Influenza-Unspecified 01/10/2016  . PFIZER SARS-COV-2 Vaccination 07/05/2019, 08/22/2019  . Td 07/07/2010   Pertinent  Health Maintenance Due  Topic Date  Due  . COLONOSCOPY  Never done  . INFLUENZA VACCINE  11/06/2019    Vitals:   02/09/20 1211  BP: 115/75  Pulse: 82  Resp: 20  Temp: (!) 97.4 F (36.3 C)  TempSrc: Oral  Weight: 151 lb 6.4 oz (68.7 kg)  Height: 5\' 11"  (1.803 m)   Body mass index is 21.12 kg/m.  Physical Exam  GENERAL APPEARANCE: Well nourished. In no acute distress. Normal body habitus SKIN:  Skin is warm and dry.  MOUTH and THROAT: Lips are without lesions. Oral mucosa is moist and without lesions. Tongue is normal in shape, size, and color and without lesions RESPIRATORY: Breathing is even & unlabored, BS CTAB CARDIAC: RRR, no murmur,no extra heart sounds, no edema GI: Abdomen soft, normal BS, no masses, no tenderness EXTREMITIES:  Right BKA NEUROLOGICAL: There is no tremor. Speech is clear. Alert and oriented X 3. PSYCHIATRIC:  Affect and behavior are appropriate  Labs reviewed: Recent Labs    01/20/20 0057 01/21/20 0428 01/25/20 0618 01/25/20 0618 01/26/20 0744 01/27/20 0133 02/03/20 0000  NA  --    < > 135   < > 135 136 141  K  --    < > 4.3   < > 4.4 4.2 4.4  CL  --    < > 102   < > 102 103 104  CO2  --    < > 24   < > 25 25 24*  GLUCOSE  --    < > 87  --  91 97  --   BUN  --    < > 10   < > 11 13 8   CREATININE  --    < > 0.86   < > 0.97 0.97 0.8  CALCIUM  --    < > 9.2   < > 9.4 9.2 9.7  MG 1.8   < > 1.8  --  1.8 1.8  --   PHOS 2.0*  --   --   --   --   --   --    < > = values in this interval not displayed.   Recent Labs    01/25/20 0618 01/25/20 0618 01/26/20 0744 01/27/20 0133 02/03/20 0000  AST 357*   < > 305* 201* 90*  ALT 284*   < > 298* 256* 127*  ALKPHOS 85   < > 93 81 104  BILITOT 1.0  --  0.8 0.5  --   PROT 6.9  --  7.3 7.1  --   ALBUMIN 3.2*   < > 3.3* 3.1* 4.0   < > = values in this interval not displayed.   Recent Labs    01/24/20 0410 01/24/20 0410 01/25/20 0618 01/25/20 0618 01/26/20 0744 01/27/20 0133 02/03/20 0000  WBC 4.8   < > 4.3   < > 5.1 5.9 6.3    NEUTROABS 2.8  --  2.3  --   --   --  3.10  HGB 13.8   < > 14.4   < > 14.6 13.3 14.1  HCT 41.1   < >  43.6   < > 44.8 40.2 44  MCV 92.2   < > 95.2  --  95.1 95.3  --   PLT 130*   < > 150   < > 167 187 332   < > = values in this interval not displayed.   Lab Results  Component Value Date   TSH 1.12 07/29/2011   Lab Results  Component Value Date   HGBA1C 5.4 01/20/2020   Lab Results  Component Value Date   CHOL 219 (H) 01/20/2020   HDL 73 01/20/2020   LDLCALC 129 (H) 01/20/2020   TRIG 85 01/20/2020   CHOLHDL 3.0 01/20/2020    Significant Diagnostic Results in last 30 days:  EEG  Result Date: 01/20/2020 Charlsie QuestYadav, Priyanka O, MD     01/20/2020  9:52 AM Patient Name: Levan HurstRichard K Holley MRN: [id number] Epilepsy Attending: Charlsie QuestPriyanka O Yadav Referring Physician/Provider: Ritta SlotMcNeill Kirkpatrick Date: 01/20/2020 Duration: 26.32 mins Patient history:  64 year old male with a previous right temporal infarct (apparently unknown to patient) who presents with two seizures. EEG to evaluate for seizure. Level of alertness: Awake, asleep AEDs during EEG study: LEV Technical aspects: This EEG study was done with scalp electrodes positioned according to the 10-20 International system of electrode placement. Electrical activity was acquired at a sampling rate of 500Hz  and reviewed with a high frequency filter of 70Hz  and a low frequency filter of 1Hz . EEG data were recorded continuously and digitally stored. Description: The posterior dominant rhythm consists of 9 Hz activity of moderate voltage (25-35 uV) seen predominantly in posterior head regions, symmetric and reactive to eye opening and eye closing. Sleep was characterized by vertex waves, sleep spindles (12 to 14 Hz), maximal frontocentral region.  EEG showed continuous 3 to 6 Hz theta-delta slowing in right temporal region.  Hyperventilation and photic stimulation were not performed.   ABNORMALITY -Continuous slow, right temporal region IMPRESSION: This study  is suggestive of cortical dysfunction in right temporal region consistent with underlying stroke.  No seizures or epileptiform discharges were seen throughout the recording. Charlsie QuestPriyanka O Yadav   DG Abd 1 View  Result Date: 01/20/2020 CLINICAL DATA:  Seizures EXAM: ABDOMEN - 1 VIEW COMPARISON:  None. FINDINGS: Normal abdominal gas pattern. No organomegaly. Vascular stents are seen within the right common iliac artery and terminal aorta. No unexpected metallic foreign body within the visualized abdomen. IMPRESSION: No unexpected metallic foreign body within the visualized abdomen. Electronically Signed   By: Helyn NumbersAshesh  Parikh MD   On: 01/20/2020 03:26   CT Head Wo Contrast  Result Date: 01/19/2020 CLINICAL DATA:  64 year old male status post unwitnessed seizure. EXAM: CT HEAD WITHOUT CONTRAST TECHNIQUE: Contiguous axial images were obtained from the base of the skull through the vertex without intravenous contrast. COMPARISON:  Head CT, CTA head and neck 03/02/2016 FINDINGS: Brain: Moderate sized chronic infarct with encephalomalacia in the posterior right MCA territory, new since 2017. Mild ex vacuo ventricular enlargement. Progressed bilateral cerebral white matter hypodensity also since 2017. But otherwise stable gray-white matter differentiation. No midline shift, ventriculomegaly, mass effect, evidence of mass lesion, intracranial hemorrhage or evidence of cortically based acute infarction. Gray-white matter differentiation is within normal limits throughout the brain. Vascular: Calcified atherosclerosis at the skull base. No suspicious intracranial vascular hyperdensity. Skull: Stable, intact. Sinuses/Orbits: Progressed chronic sinus disease since 2017, most affecting the sphenoid and right maxillary sinuses. Tympanic cavities and mastoids remain clear. Other: No acute orbit or scalp soft tissue injury. IMPRESSION: 1. No acute intracranial abnormality or acute  traumatic injury identified. 2. Chronic  posterior right MCA territory infarct, new since 2017. 3. Progressed chronic paranasal sinus disease. Electronically Signed   By: Odessa Fleming M.D.   On: 01/19/2020 23:13   MR ANGIO HEAD WO CONTRAST  Result Date: 01/20/2020 CLINICAL DATA:  Stroke, follow-up; seizure EXAM: MRA HEAD WITHOUT CONTRAST TECHNIQUE: Angiographic images of the Circle of Willis were obtained using MRA technique without intravenous contrast. COMPARISON:  CT 11/25/2015 FINDINGS: Intracranial internal carotid arteries are patent. Middle and anterior cerebral arteries are patent. Congenitally absent right A1 ACA. There is absent visualization of the proximal intracranial right vertebral artery. Reconstitution at the level of the PICA origin probably via retrograde flow. This was present on 2017 CTA. Intracranial left right vertebral artery, basilar artery, posterior cerebral arteries are patent. Large bilateral posterior communicating arteries are present with fetal or near fetal origin of the posterior cerebral arteries. IMPRESSION: No new proximal intracranial vessel occlusion. 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 Electronically Signed   By: Guadlupe Spanish M.D.   On: 01/20/2020 11:04   MR BRAIN WO CONTRAST  Result Date: 01/20/2020 CLINICAL DATA:  Stroke, follow-up.  Seizure. EXAM: MRI HEAD WITHOUT CONTRAST TECHNIQUE: Multiplanar, multiecho pulse sequences of the brain and surrounding structures were obtained without intravenous contrast. The examination had to be discontinued prior to completion due to patient would not tolerate additional sequences. COMPARISON:  CT head without contrast 01/19/2020 FINDINGS: Brain: Remote posterior right MCA territory infarct is confirmed. Ex vacuo dilation of the right lateral ventricle is present. Periventricular white matter changes are moderately advanced for age. Asymmetric changes are present in the right corona  radiata. No acute infarct, hemorrhage, or mass lesion is present. The ventricles are proportionate to the degree of atrophy. No significant extraaxial fluid collection is present. Brainstem demonstrates wallerian changes along the right cortical spinal tracts. Remote lacunar infarcts are present within the cerebellum bilaterally. Vascular: Flow is present in the major intracranial arteries. Skull and upper cervical spine: The craniocervical junction is normal. Upper cervical spine is within normal limits. Marrow signal is unremarkable. Sinuses/Orbits: Right maxillary bilateral posterior ethmoid and sphenoid sinus disease is chronic. The globes and orbits are within normal limits. Mastoid air cells are clear. IMPRESSION: 1. No acute intracranial abnormality. 2. Remote posterior right MCA territory infarct. 3. Remote lacunar infarcts of the cerebellum bilaterally. 4. Atrophy and white matter disease is moderately advanced for age. This likely reflects the sequela of chronic microvascular ischemia. 5. 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 Electronically Signed   By: Marin Roberts M.D.   On: 01/20/2020 05:09   DG Chest Portable 1 View  Result Date: 01/19/2020 CLINICAL DATA:  Fall, altered mental status, seizure EXAM: PORTABLE CHEST 1 VIEW COMPARISON:  None. FINDINGS: The heart size and mediastinal contours are within normal limits. Both lungs are clear. The visualized skeletal structures are unremarkable. IMPRESSION: No active disease. Electronically Signed   By: Helyn Numbers MD   On: 01/19/2020 23:10   ECHOCARDIOGRAM COMPLETE  Result Date: 01/20/2020    ECHOCARDIOGRAM REPORT   Patient Name:   Dominic Carter Date of Exam: 01/20/2020 Medical Rec #:  [id number]         Height:       71.0 in Accession #:    [id number]        Weight:       170.0 lb Date of Birth:  [birth year]  BSA:          1.968 m Patient Age:    64 years          BP:           151/80  mmHg Patient Gender: M                 HR:           80 bpm. Exam Location:  Inpatient Procedure: 2D Echo and Intracardiac Opacification Agent Indications:    Stroke 434.91 / I163.9  History:        Patient has no prior history of Echocardiogram examinations.                 Risk Factors:Hypertension and Dyslipidemia. Past history of DVT.                 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  Sonographer:    Leta Jungling RDCS Referring Phys: [phone] JARED M GARDNER  Sonographer Comments: Technically difficult study due to poor echo windows. Imaging difficult due to patients altered mental status. IMPRESSIONS  1. Left ventricular ejection fraction, by estimation, is 40%. The left ventricle has mildly decreased function. The left ventricle demonstrates regional wall motion abnormalities (see scoring diagram/findings for description). There is mild left ventricular hypertrophy. Left ventricular diastolic parameters are consistent with Grade I diastolic dysfunction (impaired relaxation).  2. Right ventricular systolic function is mildly reduced. The right ventricular size is normal. Tricuspid regurgitation signal is inadequate for assessing PA pressure.  3. The mitral valve is normal in structure. No evidence of mitral valve regurgitation. No evidence of mitral stenosis.  4. The aortic valve is grossly normal. Aortic valve regurgitation is mild. No aortic stenosis is present.  5. The inferior vena cava is normal in size with greater than 50% respiratory variability, suggesting right atrial pressure of 3 mmHg. Conclusion(s)/Recommendation(s): No intracardiac source of embolism detected on this transthoracic study. A transesophageal echocardiogram is recommended to exclude cardiac source of embolism if clinically indicated. No left ventricular mural or apical thrombus/thrombi. FINDINGS  Left Ventricle: Left ventricular ejection fraction, by estimation, is 40%. The left ventricle has mildly decreased function. The left ventricle  demonstrates regional wall motion abnormalities. Definity contrast agent was given IV to delineate the left ventricular endocardial borders. The left ventricular internal cavity size was normal in size. There is mild left ventricular hypertrophy. Left ventricular diastolic parameters are consistent with Grade I diastolic dysfunction (impaired relaxation).  LV Wall Scoring: The anterior septum, mid inferoseptal segment, and basal inferoseptal segment are hypokinetic. Right Ventricle: The right ventricular size is normal. No increase in right ventricular wall thickness. Right ventricular systolic function is mildly reduced. Tricuspid regurgitation signal is inadequate for assessing PA pressure. Left Atrium: Left atrial size was normal in size. Right Atrium: Right atrial size was normal in size. Pericardium: There is no evidence of pericardial effusion. Mitral Valve: The mitral valve is normal in structure. No evidence of mitral valve regurgitation. No evidence of mitral valve stenosis. Tricuspid Valve: The tricuspid valve is normal in structure. Tricuspid valve regurgitation is not demonstrated. No evidence of tricuspid stenosis. Aortic Valve: The aortic valve is grossly normal. Aortic valve regurgitation is mild. No aortic stenosis is present. Pulmonic Valve: The pulmonic valve was normal in structure. Pulmonic valve regurgitation is trivial. No evidence of pulmonic stenosis. Aorta: The aortic root and ascending aorta are structurally normal, with no evidence of dilitation. Venous: The inferior vena cava is normal in size  with greater than 50% respiratory variability, suggesting right atrial pressure of 3 mmHg. IAS/Shunts: The interatrial septum was not well visualized.  LEFT VENTRICLE PLAX 2D LVIDd:         5.36 cm      Diastology LVIDs:         4.57 cm      LV e' medial:    5.59 cm/s LV PW:         1.04 cm      LV E/e' medial:  13.6 LV IVS:        0.93 cm      LV e' lateral:   6.89 cm/s LVOT diam:     2.30 cm       LV E/e' lateral: 11.1 LV SV:         62 LV SV Index:   32 LVOT Area:     4.15 cm  LV Volumes (MOD) LV vol d, MOD A2C: 112.0 ml LV vol d, MOD A4C: 117.0 ml LV vol s, MOD A2C: 85.3 ml LV vol s, MOD A4C: 57.5 ml LV SV MOD A2C:     26.7 ml LV SV MOD A4C:     117.0 ml LV SV MOD BP:      43.4 ml RIGHT VENTRICLE RV S prime:     11.00 cm/s TAPSE (M-mode): 2.0 cm LEFT ATRIUM             Index       RIGHT ATRIUM           Index LA diam:        2.70 cm 1.37 cm/m  RA Area:     15.10 cm LA Vol (A2C):   38.6 ml 19.62 ml/m RA Volume:   37.50 ml  19.06 ml/m LA Vol (A4C):   50.8 ml 25.82 ml/m LA Biplane Vol: 44.7 ml 22.72 ml/m  AORTIC VALVE LVOT Vmax:   100.00 cm/s LVOT Vmean:  61.200 cm/s LVOT VTI:    0.150 m  AORTA Ao Root diam: 3.70 cm MITRAL VALVE MV Area (PHT): 3.27 cm     SHUNTS MV Decel Time: 232 msec     Systemic VTI:  0.15 m MV E velocity: 76.30 cm/s   Systemic Diam: 2.30 cm MV A velocity: 111.00 cm/s MV E/A ratio:  0.69 Weston Brass MD Electronically signed by Weston Brass MD Signature Date/Time: 01/20/2020/10:49:44 AM    Final    VAS US CAROTID (at [facility] and [facility] only)  Result Date: 01/20/2020 Carotid Arterial Duplex Study Indications:       CVA. Risk Factors:      Hypertension. Limitations        Today's exam was limited due to the patient's inability or                    unwillingness to cooperate, the patient's respiratory                    variation and patient confusion, patient somnolence, patient                    movement. Comparison Study:  No prior studies. Performing Technologist: Chanda Busing RVT  Examination Guidelines: A complete evaluation includes B-mode imaging, spectral Doppler, color Doppler, and power Doppler as needed of all accessible portions of each vessel. Bilateral testing is considered an integral part of a complete examination. Limited examinations for reoccurring indications may be performed as noted.  Right Carotid Findings:  +----------+--------+--------+--------+-----------------------+--------+  PSV cm/sEDV cm/sStenosisPlaque Description     Comments +----------+--------+--------+--------+-----------------------+--------+ CCA Prox  87      16              smooth and heterogenous         +----------+--------+--------+--------+-----------------------+--------+ CCA Distal56      11              smooth and heterogenous         +----------+--------+--------+--------+-----------------------+--------+ ICA Prox  34      10              smooth and heterogenous         +----------+--------+--------+--------+-----------------------+--------+ ICA Distal51      12                                     tortuous +----------+--------+--------+--------+-----------------------+--------+ ECA       84      10                                              +----------+--------+--------+--------+-----------------------+--------+ +----------+--------+-------+--------+-------------------+           PSV cm/sEDV cmsDescribeArm Pressure (mmHG) +----------+--------+-------+--------+-------------------+ [id number]                                         +----------+--------+-------+--------+-------------------+ +---------+--------+--+--------+--+---------+ VertebralPSV cm/s24EDV cm/s10Antegrade +---------+--------+--+--------+--+---------+  Left Carotid Findings: +----------+--------+--------+--------+-----------------------+--------+           PSV cm/sEDV cm/sStenosisPlaque Description     Comments +----------+--------+--------+--------+-----------------------+--------+ CCA Prox  90      20              smooth and heterogenous         +----------+--------+--------+--------+-----------------------+--------+ CCA Distal64      13              smooth and heterogenous         +----------+--------+--------+--------+-----------------------+--------+ ICA Prox  39      12               smooth and heterogenous         +----------+--------+--------+--------+-----------------------+--------+ ICA Distal52      17                                     tortuous +----------+--------+--------+--------+-----------------------+--------+ ECA       106     17                                              +----------+--------+--------+--------+-----------------------+--------+ +----------+--------+--------+--------+-------------------+           PSV cm/sEDV cm/sDescribeArm Pressure (mmHG) +----------+--------+--------+--------+-------------------+ [id number]                                         +----------+--------+--------+--------+-------------------+ +---------+--------+--+--------+--+---------+ VertebralPSV cm/s25EDV cm/s10Antegrade +---------+--------+--+--------+--+---------+   Summary: Right Carotid: Velocities in the right ICA are consistent with a 1-39% stenosis. Left Carotid: Velocities  in the left ICA are consistent with a 1-39% stenosis. Vertebrals: Bilateral vertebral arteries demonstrate antegrade flow. *See table(s) above for measurements and observations.  Electronically signed by Sherald Hess MD on 01/20/2020 at 2:23:46 PM.    Final    US Abdomen Limited RUQ (LIVER/GB)  Result Date: 01/21/2020 CLINICAL DATA:  Cirrhosis EXAM: ULTRASOUND ABDOMEN LIMITED RIGHT UPPER QUADRANT COMPARISON:  None. FINDINGS: Gallbladder: No gallstones or wall thickening visualized. No sonographic Murphy sign noted by sonographer. Common bile duct: Diameter: Normal caliber, 4 mm Liver: Increased echotexture compatible with fatty infiltration. No focal abnormality or biliary ductal dilatation. Portal vein is patent on color Doppler imaging with normal direction of blood flow towards the liver. Other: None. IMPRESSION: Fatty infiltration of the liver. No acute findings. Electronically Signed   By: Charlett Nose M.D.   On: 01/21/2020 20:01    Assessment/Plan  1.  Seizure (HCC) - levETIRAcetam (KEPPRA) 500 MG tablet; Take 1 tablet (500 mg total) by mouth 2 (two) times daily.  Dispense: 60 tablet; Refill: 0  2. Primary hypertension - amLODipine (NORVASC) 10 MG tablet; Take 1 tablet (10 mg total) by mouth daily.  Dispense: 30 tablet; Refill: 0  3. ETOH abuse - folic acid (FOLVITE) 1 MG tablet; Take 1 tablet (1 mg total) by mouth daily.  Dispense: 30 tablet; Refill: 0 - thiamine 100 MG tablet; Take 1 tablet (100 mg total) by mouth daily.  Dispense: 30 tablet; Refill: 0  4. Tobacco use - nicotine (NICODERM CQ - DOSED IN MG/24 HOURS) 21 mg/24hr patch; Place 1 patch (21 mg total) onto the skin daily.  Dispense: 28 patch; Refill: 0     I have filled out patient's discharge paperwork and e-prescribed medications.  Patient will have home health PT and OT.  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  A cane or walker will not resolve issue with performing activities of daily living.  A wheelchair will allow patient to safely perform daily activities.  Patient can safely propel the wheelchair in the home or has a caregiver who can provide assistance.   Total discharge time: Greater than 30 minutes Greater than 50% was spent in counseling and coordination of care.   Discharge time involved coordination of the discharge process with social worker, nursing staff and therapy department. Medical justification for home health services/DME verified.   Kenard Gower, DNP, MSN, FNP-BC [facility] [phone] (Monday-Friday 8:00 a.m. - 5:00 p.m.) [phone] (after hours)

## 2020-02-28 ENCOUNTER — Ambulatory Visit: Payer: Self-pay | Admitting: Diagnostic Neuroimaging

## 2020-04-07 DIAGNOSIS — I639 Cerebral infarction, unspecified: Secondary | ICD-10-CM

## 2020-04-07 HISTORY — PX: HERNIA REPAIR: SHX51

## 2020-04-07 HISTORY — DX: Cerebral infarction, unspecified: I63.9

## 2020-07-23 ENCOUNTER — Other Ambulatory Visit: Payer: Self-pay | Admitting: Adult Health

## 2020-07-23 DIAGNOSIS — R569 Unspecified convulsions: Secondary | ICD-10-CM

## 2020-08-28 ENCOUNTER — Other Ambulatory Visit: Payer: Self-pay

## 2020-08-28 ENCOUNTER — Emergency Department (HOSPITAL_COMMUNITY): Payer: Medicare Other

## 2020-08-28 ENCOUNTER — Inpatient Hospital Stay (HOSPITAL_COMMUNITY)
Admission: EM | Admit: 2020-08-28 | Discharge: 2020-09-06 | DRG: 897 | Payer: Medicare Other | Attending: Internal Medicine | Admitting: Internal Medicine

## 2020-08-28 ENCOUNTER — Encounter (HOSPITAL_COMMUNITY): Payer: Self-pay

## 2020-08-28 ENCOUNTER — Inpatient Hospital Stay (HOSPITAL_COMMUNITY): Payer: Medicare Other

## 2020-08-28 DIAGNOSIS — T17908A Unspecified foreign body in respiratory tract, part unspecified causing other injury, initial encounter: Secondary | ICD-10-CM

## 2020-08-28 DIAGNOSIS — F17210 Nicotine dependence, cigarettes, uncomplicated: Secondary | ICD-10-CM | POA: Diagnosis present

## 2020-08-28 DIAGNOSIS — M79642 Pain in left hand: Secondary | ICD-10-CM | POA: Diagnosis not present

## 2020-08-28 DIAGNOSIS — R569 Unspecified convulsions: Secondary | ICD-10-CM

## 2020-08-28 DIAGNOSIS — F10221 Alcohol dependence with intoxication delirium: Secondary | ICD-10-CM | POA: Diagnosis present

## 2020-08-28 DIAGNOSIS — K7010 Alcoholic hepatitis without ascites: Secondary | ICD-10-CM | POA: Diagnosis present

## 2020-08-28 DIAGNOSIS — F10932 Alcohol use, unspecified with withdrawal with perceptual disturbance: Secondary | ICD-10-CM

## 2020-08-28 DIAGNOSIS — Z7982 Long term (current) use of aspirin: Secondary | ICD-10-CM

## 2020-08-28 DIAGNOSIS — Z86718 Personal history of other venous thrombosis and embolism: Secondary | ICD-10-CM | POA: Diagnosis not present

## 2020-08-28 DIAGNOSIS — Z9114 Patient's other noncompliance with medication regimen: Secondary | ICD-10-CM | POA: Diagnosis not present

## 2020-08-28 DIAGNOSIS — G40909 Epilepsy, unspecified, not intractable, without status epilepticus: Secondary | ICD-10-CM

## 2020-08-28 DIAGNOSIS — G40409 Other generalized epilepsy and epileptic syndromes, not intractable, without status epilepticus: Secondary | ICD-10-CM | POA: Diagnosis present

## 2020-08-28 DIAGNOSIS — I10 Essential (primary) hypertension: Secondary | ICD-10-CM | POA: Diagnosis present

## 2020-08-28 DIAGNOSIS — Z8249 Family history of ischemic heart disease and other diseases of the circulatory system: Secondary | ICD-10-CM | POA: Diagnosis not present

## 2020-08-28 DIAGNOSIS — E785 Hyperlipidemia, unspecified: Secondary | ICD-10-CM | POA: Diagnosis present

## 2020-08-28 DIAGNOSIS — R059 Cough, unspecified: Secondary | ICD-10-CM | POA: Diagnosis present

## 2020-08-28 DIAGNOSIS — R4781 Slurred speech: Secondary | ICD-10-CM | POA: Diagnosis present

## 2020-08-28 DIAGNOSIS — F10232 Alcohol dependence with withdrawal with perceptual disturbance: Principal | ICD-10-CM | POA: Diagnosis present

## 2020-08-28 DIAGNOSIS — G928 Other toxic encephalopathy: Secondary | ICD-10-CM | POA: Diagnosis present

## 2020-08-28 DIAGNOSIS — Z79899 Other long term (current) drug therapy: Secondary | ICD-10-CM

## 2020-08-28 DIAGNOSIS — Z20822 Contact with and (suspected) exposure to covid-19: Secondary | ICD-10-CM | POA: Diagnosis present

## 2020-08-28 DIAGNOSIS — R197 Diarrhea, unspecified: Secondary | ICD-10-CM | POA: Diagnosis not present

## 2020-08-28 DIAGNOSIS — R159 Full incontinence of feces: Secondary | ICD-10-CM | POA: Diagnosis present

## 2020-08-28 DIAGNOSIS — I69398 Other sequelae of cerebral infarction: Secondary | ICD-10-CM

## 2020-08-28 DIAGNOSIS — R32 Unspecified urinary incontinence: Secondary | ICD-10-CM | POA: Diagnosis present

## 2020-08-28 DIAGNOSIS — Z89511 Acquired absence of right leg below knee: Secondary | ICD-10-CM | POA: Diagnosis not present

## 2020-08-28 DIAGNOSIS — W19XXXA Unspecified fall, initial encounter: Secondary | ICD-10-CM

## 2020-08-28 DIAGNOSIS — F1010 Alcohol abuse, uncomplicated: Secondary | ICD-10-CM | POA: Diagnosis not present

## 2020-08-28 DIAGNOSIS — F10231 Alcohol dependence with withdrawal delirium: Secondary | ICD-10-CM | POA: Diagnosis present

## 2020-08-28 DIAGNOSIS — F10939 Alcohol use, unspecified with withdrawal, unspecified: Secondary | ICD-10-CM

## 2020-08-28 LAB — I-STAT VENOUS BLOOD GAS, ED
Acid-Base Excess: 1 mmol/L (ref 0.0–2.0)
Bicarbonate: 25.8 mmol/L (ref 20.0–28.0)
Calcium, Ion: 1.09 mmol/L — ABNORMAL LOW (ref 1.15–1.40)
HCT: 42 % (ref 39.0–52.0)
Hemoglobin: 14.3 g/dL (ref 13.0–17.0)
O2 Saturation: 68 %
Potassium: 4 mmol/L (ref 3.5–5.1)
Sodium: 137 mmol/L (ref 135–145)
TCO2: 27 mmol/L (ref 22–32)
pCO2, Ven: 41.9 mmHg — ABNORMAL LOW (ref 44.0–60.0)
pH, Ven: 7.397 (ref 7.250–7.430)
pO2, Ven: 36 mmHg (ref 32.0–45.0)

## 2020-08-28 LAB — CBC WITH DIFFERENTIAL/PLATELET
Abs Immature Granulocytes: 0.03 10*3/uL (ref 0.00–0.07)
Basophils Absolute: 0.1 10*3/uL (ref 0.0–0.1)
Basophils Relative: 1 %
Eosinophils Absolute: 0.1 10*3/uL (ref 0.0–0.5)
Eosinophils Relative: 2 %
HCT: 40.1 % (ref 39.0–52.0)
Hemoglobin: 13.6 g/dL (ref 13.0–17.0)
Immature Granulocytes: 1 %
Lymphocytes Relative: 18 %
Lymphs Abs: 0.9 10*3/uL (ref 0.7–4.0)
MCH: 30.7 pg (ref 26.0–34.0)
MCHC: 33.9 g/dL (ref 30.0–36.0)
MCV: 90.5 fL (ref 80.0–100.0)
Monocytes Absolute: 1 10*3/uL (ref 0.1–1.0)
Monocytes Relative: 19 %
Neutro Abs: 3.1 10*3/uL (ref 1.7–7.7)
Neutrophils Relative %: 59 %
Platelets: 111 10*3/uL — ABNORMAL LOW (ref 150–400)
RBC: 4.43 MIL/uL (ref 4.22–5.81)
RDW: 15.9 % — ABNORMAL HIGH (ref 11.5–15.5)
WBC: 5.1 10*3/uL (ref 4.0–10.5)
nRBC: 0 % (ref 0.0–0.2)

## 2020-08-28 LAB — RAPID URINE DRUG SCREEN, HOSP PERFORMED
Amphetamines: NOT DETECTED
Barbiturates: NOT DETECTED
Benzodiazepines: POSITIVE — AB
Cocaine: NOT DETECTED
Opiates: NOT DETECTED
Tetrahydrocannabinol: NOT DETECTED

## 2020-08-28 LAB — CBC
HCT: 40 % (ref 39.0–52.0)
Hemoglobin: 12.8 g/dL — ABNORMAL LOW (ref 13.0–17.0)
MCH: 30.1 pg (ref 26.0–34.0)
MCHC: 32 g/dL (ref 30.0–36.0)
MCV: 94.1 fL (ref 80.0–100.0)
Platelets: 90 10*3/uL — ABNORMAL LOW (ref 150–400)
RBC: 4.25 MIL/uL (ref 4.22–5.81)
RDW: 16.5 % — ABNORMAL HIGH (ref 11.5–15.5)
WBC: 8.2 10*3/uL (ref 4.0–10.5)
nRBC: 0 % (ref 0.0–0.2)

## 2020-08-28 LAB — HEPATIC FUNCTION PANEL
ALT: 102 U/L — ABNORMAL HIGH (ref 0–44)
AST: 178 U/L — ABNORMAL HIGH (ref 15–41)
Albumin: 3.7 g/dL (ref 3.5–5.0)
Alkaline Phosphatase: 99 U/L (ref 38–126)
Bilirubin, Direct: 0.5 mg/dL — ABNORMAL HIGH (ref 0.0–0.2)
Indirect Bilirubin: 1 mg/dL — ABNORMAL HIGH (ref 0.3–0.9)
Total Bilirubin: 1.5 mg/dL — ABNORMAL HIGH (ref 0.3–1.2)
Total Protein: 7.7 g/dL (ref 6.5–8.1)

## 2020-08-28 LAB — COMPREHENSIVE METABOLIC PANEL
ALT: 84 U/L — ABNORMAL HIGH (ref 0–44)
AST: 138 U/L — ABNORMAL HIGH (ref 15–41)
Albumin: 3.2 g/dL — ABNORMAL LOW (ref 3.5–5.0)
Alkaline Phosphatase: 84 U/L (ref 38–126)
Anion gap: 10 (ref 5–15)
BUN: 5 mg/dL — ABNORMAL LOW (ref 8–23)
CO2: 22 mmol/L (ref 22–32)
Calcium: 8.3 mg/dL — ABNORMAL LOW (ref 8.9–10.3)
Chloride: 102 mmol/L (ref 98–111)
Creatinine, Ser: 0.82 mg/dL (ref 0.61–1.24)
GFR, Estimated: >60 mL/min (ref >60)
Glucose, Bld: 102 mg/dL — ABNORMAL HIGH (ref 70–99)
Potassium: 3.7 mmol/L (ref 3.5–5.1)
Sodium: 134 mmol/L — ABNORMAL LOW (ref 135–145)
Total Bilirubin: 2.2 mg/dL — ABNORMAL HIGH (ref 0.3–1.2)
Total Protein: 6.6 g/dL (ref 6.5–8.1)

## 2020-08-28 LAB — BASIC METABOLIC PANEL
Anion gap: 17 — ABNORMAL HIGH (ref 5–15)
BUN: 6 mg/dL — ABNORMAL LOW (ref 8–23)
CO2: 17 mmol/L — ABNORMAL LOW (ref 22–32)
Calcium: 9 mg/dL (ref 8.9–10.3)
Chloride: 100 mmol/L (ref 98–111)
Creatinine, Ser: 1.04 mg/dL (ref 0.61–1.24)
GFR, Estimated: >60 mL/min (ref >60)
Glucose, Bld: 126 mg/dL — ABNORMAL HIGH (ref 70–99)
Potassium: 3.9 mmol/L (ref 3.5–5.1)
Sodium: 134 mmol/L — ABNORMAL LOW (ref 135–145)

## 2020-08-28 LAB — SARS CORONAVIRUS 2 (TAT 6-24 HRS): SARS Coronavirus 2: NEGATIVE

## 2020-08-28 LAB — URINALYSIS, MICROSCOPIC (REFLEX)
RBC / HPF: NONE SEEN RBC/hpf (ref 0–5)
Squamous Epithelial / HPF: NONE SEEN (ref 0–5)
WBC, UA: NONE SEEN WBC/hpf (ref 0–5)

## 2020-08-28 LAB — URINALYSIS, ROUTINE W REFLEX MICROSCOPIC
Glucose, UA: NEGATIVE mg/dL
Hgb urine dipstick: NEGATIVE
Ketones, ur: NEGATIVE mg/dL
Leukocytes,Ua: NEGATIVE
Nitrite: NEGATIVE
Protein, ur: 30 mg/dL — AB
Specific Gravity, Urine: 1.02 (ref 1.005–1.030)
pH: 8 (ref 5.0–8.0)

## 2020-08-28 LAB — AMMONIA: Ammonia: 73 umol/L — ABNORMAL HIGH (ref 9–35)

## 2020-08-28 LAB — CK: Total CK: 241 U/L (ref 49–397)

## 2020-08-28 LAB — MAGNESIUM: Magnesium: 1.9 mg/dL (ref 1.7–2.4)

## 2020-08-28 LAB — LIPASE, BLOOD: Lipase: 45 U/L (ref 11–51)

## 2020-08-28 LAB — PROTIME-INR
INR: 1.1 (ref 0.8–1.2)
Prothrombin Time: 14 seconds (ref 11.4–15.2)

## 2020-08-28 LAB — ETHANOL: Alcohol, Ethyl (B): <10 mg/dL (ref <10)

## 2020-08-28 LAB — ACETAMINOPHEN LEVEL: Acetaminophen (Tylenol), Serum: <10 ug/mL — ABNORMAL LOW (ref 10–30)

## 2020-08-28 LAB — PHOSPHORUS: Phosphorus: 1.8 mg/dL — ABNORMAL LOW (ref 2.5–4.6)

## 2020-08-28 LAB — APTT: aPTT: 26 seconds (ref 24–36)

## 2020-08-28 LAB — SALICYLATE LEVEL: Salicylate Lvl: <7 mg/dL — ABNORMAL LOW (ref 7.0–30.0)

## 2020-08-28 LAB — CBG MONITORING, ED: Glucose-Capillary: 129 mg/dL — ABNORMAL HIGH (ref 70–99)

## 2020-08-28 IMAGING — CT CT HEAD W/O CM
4 of 5 series · 15 of 47 positions shown, 17 images · non-contrast
Comparison: MRI [DATE].  CT head [DATE].

CLINICAL DATA: Seizure, nontraumatic.

EXAM:
CT HEAD WITHOUT CONTRAST
TECHNIQUE: Contiguous axial images were obtained from the base of the skull
through the vertex without intravenous contrast.

[Series 3: head wo · axial · 0.47mm/px · z∈[-422,-327]mm · 4 of 33 slices shown]
[im 7/33  brain]
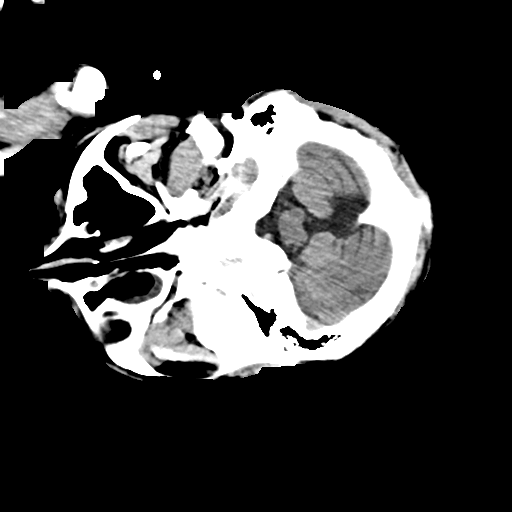
[im 13/33  brain]
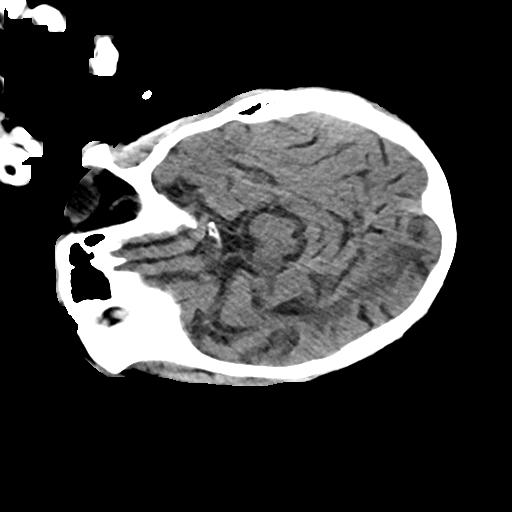
[im 20/33  brain]
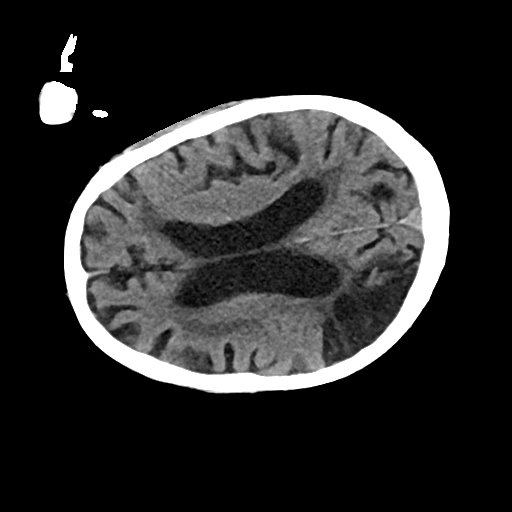
[im 26/33  brain]
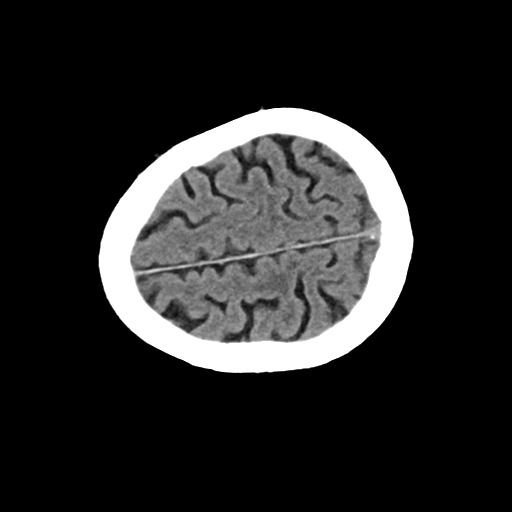

[Series 5: cor soft · coronal · 0.35mm/px · 3 of 70 slices shown]
[im 24/70  brain]
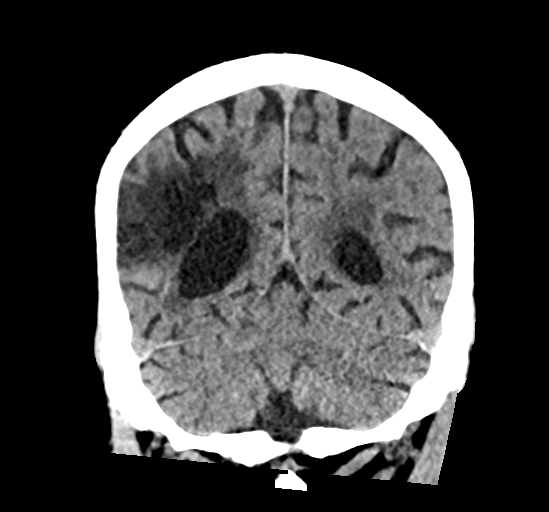
[im 31/70  brain]
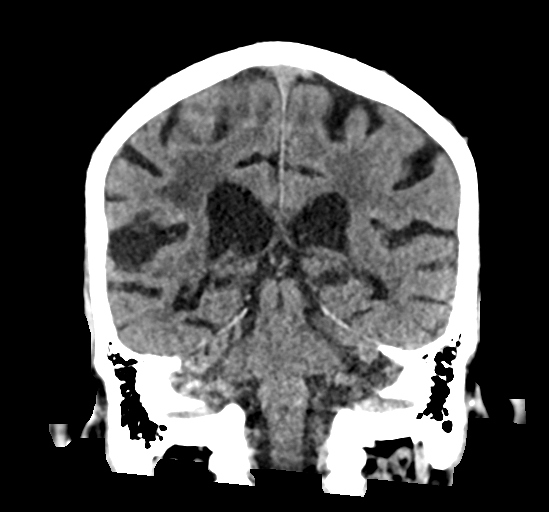
[im 39/70  brain]
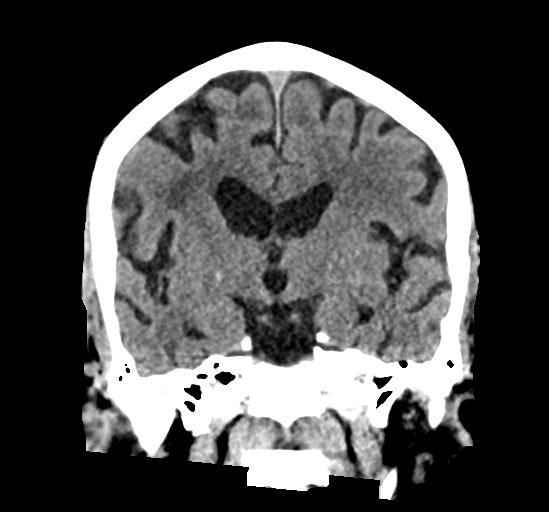

[Series 6: sag soft · sagittal · 0.36mm/px · 3 of 51 slices shown]
[im 18/51  brain]
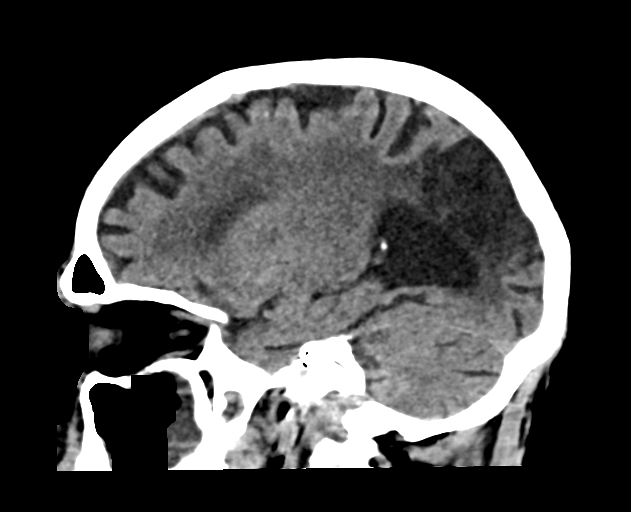
[im 26/51  brain]
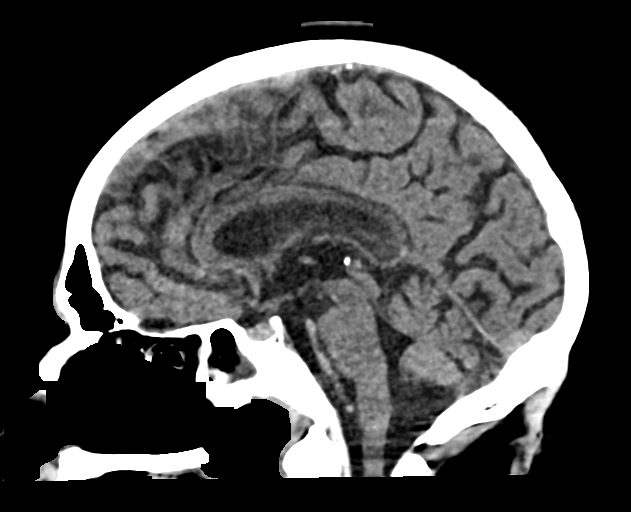
[im 34/51  brain]
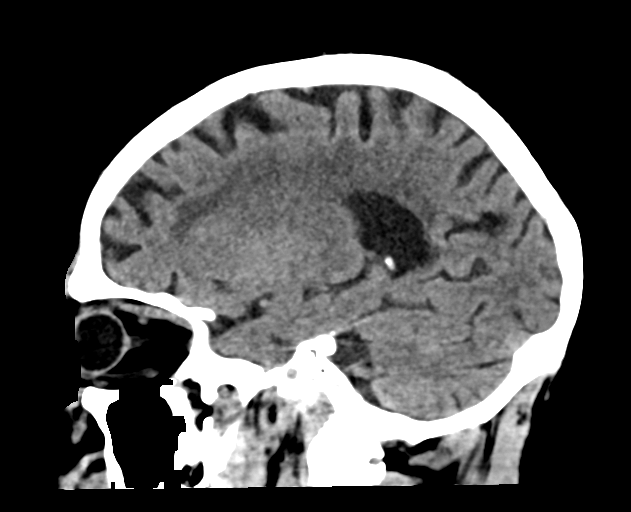

[Series 7: head · axial · 0.39mm/px · z∈[-428,-328]mm · 5 of 32 slices shown, 7 images]
[im 6/32  brain]
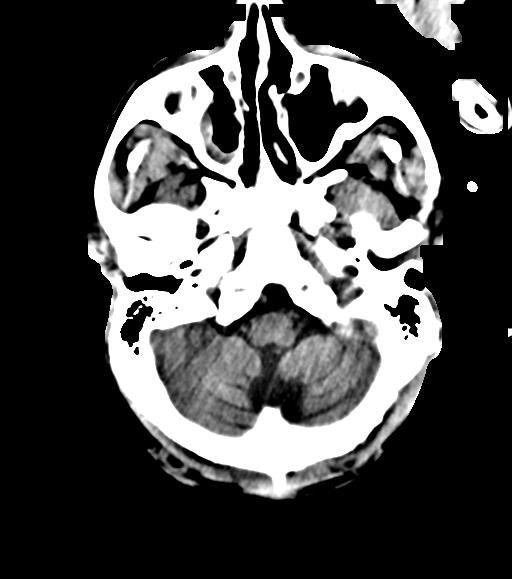
[im 6/32  bone]
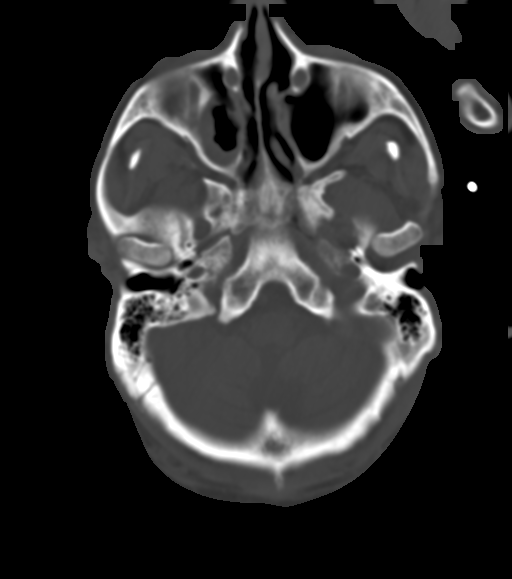
[im 11/32  brain]
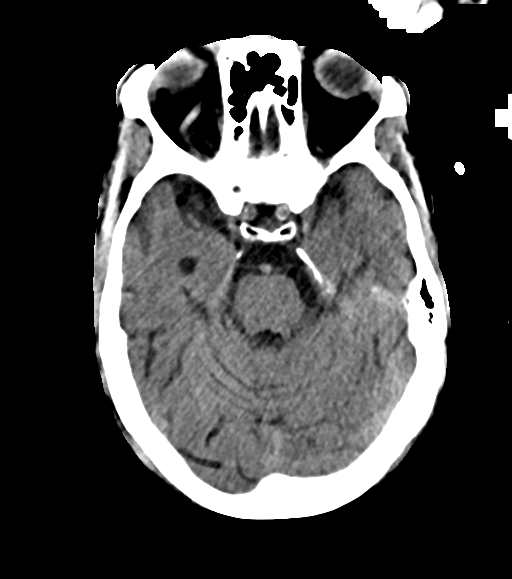
[im 16/32  brain]
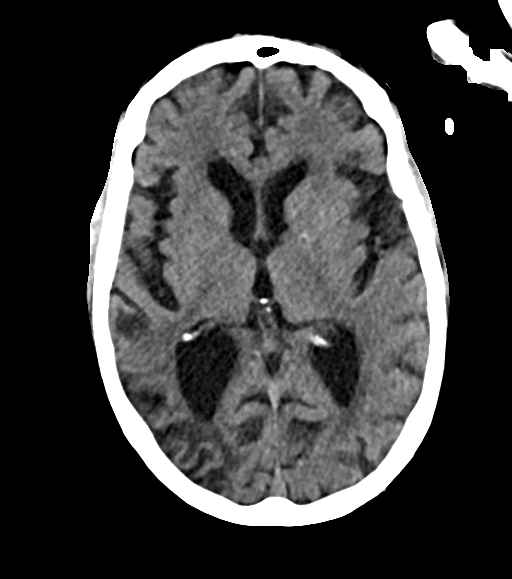
[im 21/32  brain]
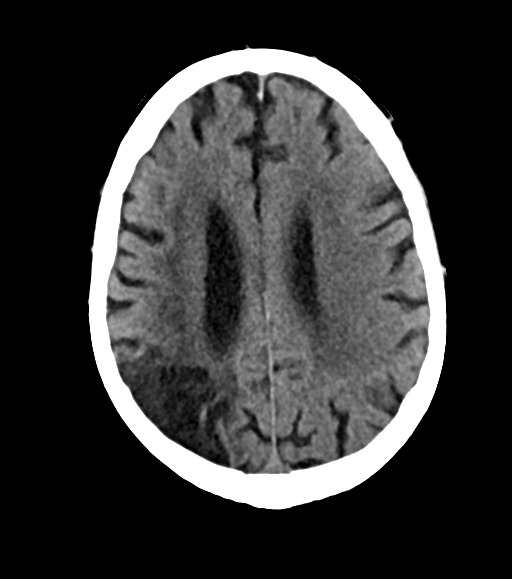
[im 26/32  brain]
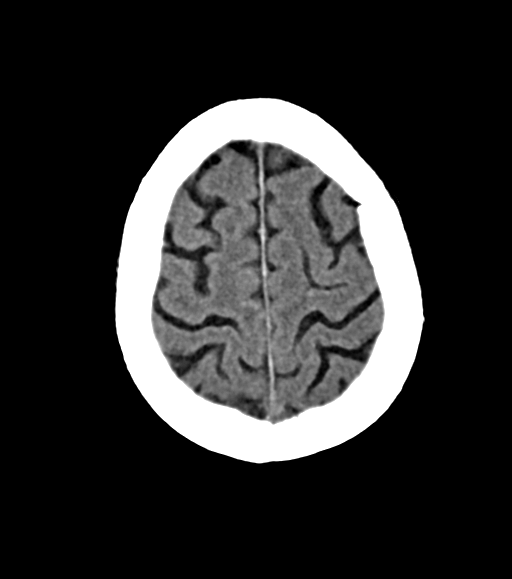
[im 26/32  bone]
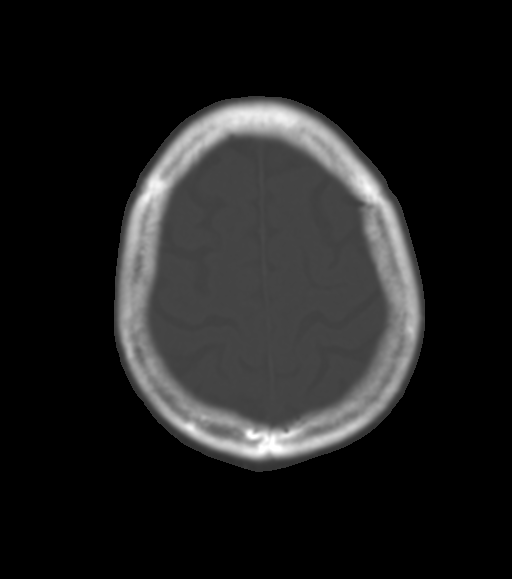

[15 of 47 positions shown; findings below may reference images not displayed]

FINDINGS: Streak artifact from the patient's hand (which they have rested on
their left face/head) mildly limits evaluation. Within this
limitation:

Brain: No evidence of acute large vascular territory infarction,
hemorrhage, hydrocephalus, extra-axial collection or mass
lesion/mass effect. Similar appearance of a remote right posterior
MCA territory infarct with associated encephalomalacia. Remote
bilateral cerebellar lacunar infarcts. Similar ex vacuo ventricular
dilation of the adjacent right lateral ventricle. Similar additional
patchy white matter hypoattenuation, most likely related to chronic
microvascular ischemic disease.

Vascular: No hyperdense vessel identified.

Skull: No acute fracture.

Sinuses/Orbits: Moderate paranasal sinus disease, greatest in the
left sphenoid sinus and right maxillary sinus right there is frothy
secretions and air-fluid levels, but also involving scattered
ethmoid air cells, right sphenoid sinus and left inferior maxillary
sinus.

Other: No mastoid effusions.
IMPRESSION: 1. No evidence of acute intracranial abnormality
2. Remote right posterior MCA territory infarct and bilateral
cerebellar lacunar infarcts.
3. Chronic microvascular ischemic change.
4. Moderate paranasal sinus mucosal thickening with frothy
secretions and air-fluid levels in the right maxillary sinus and
left sphenoid sinus.

## 2020-08-28 MED ORDER — HALOPERIDOL LACTATE 5 MG/ML IJ SOLN: 1.0000 mg | Freq: Once | INTRAMUSCULAR | Status: DC

## 2020-08-28 MED ORDER — ADULT MULTIVITAMIN W/MINERALS CH: 1.0000 | ORAL_TABLET | Freq: Every day | ORAL | Status: DC

## 2020-08-28 MED ORDER — ADULT MULTIVITAMIN W/MINERALS CH
1.0000 | ORAL_TABLET | Freq: Every day | ORAL | Status: DC
  Administered 2020-08-29 – 2020-09-06 (×9): 1 via ORAL
  Filled 2020-08-28 (×10): qty 1

## 2020-08-28 MED ORDER — SENNOSIDES-DOCUSATE SODIUM 8.6-50 MG PO TABS
1.0000 | ORAL_TABLET | Freq: Every evening | ORAL | Status: DC | PRN
  Administered 2020-09-05: 1 via ORAL
  Filled 2020-08-28: qty 1

## 2020-08-28 MED ORDER — SODIUM CHLORIDE 0.9 % IV SOLN
2500.0000 mg | Freq: Once | INTRAVENOUS | Status: AC
  Administered 2020-08-28: 2500 mg via INTRAVENOUS
  Filled 2020-08-28: qty 25

## 2020-08-28 MED ORDER — THIAMINE HCL 100 MG/ML IJ SOLN: 100.0000 mg | Freq: Every day | INTRAMUSCULAR | Status: DC

## 2020-08-28 MED ORDER — LORAZEPAM 2 MG/ML IJ SOLN
INTRAMUSCULAR | Status: AC
  Filled 2020-08-28: qty 1

## 2020-08-28 MED ORDER — THIAMINE HCL 100 MG/ML IJ SOLN
500.0000 mg | Freq: Three times a day (TID) | INTRAVENOUS | Status: AC
  Administered 2020-08-28 – 2020-08-30 (×5): 500 mg via INTRAVENOUS
  Filled 2020-08-28 (×8): qty 5

## 2020-08-28 MED ORDER — AMLODIPINE BESYLATE 10 MG PO TABS
10.0000 mg | ORAL_TABLET | Freq: Every day | ORAL | Status: DC
  Administered 2020-08-29 – 2020-09-06 (×9): 10 mg via ORAL
  Filled 2020-08-28 (×9): qty 1

## 2020-08-28 MED ORDER — LORAZEPAM 1 MG PO TABS: 1.0000 mg | ORAL_TABLET | ORAL | Status: AC | PRN

## 2020-08-28 MED ORDER — SODIUM CHLORIDE 0.9 % IV SOLN
75.0000 mL/h | INTRAVENOUS | Status: DC
  Administered 2020-08-28 – 2020-08-30 (×3): 75 mL/h via INTRAVENOUS

## 2020-08-28 MED ORDER — LEVETIRACETAM 500 MG PO TABS
500.0000 mg | ORAL_TABLET | Freq: Two times a day (BID) | ORAL | Status: DC
  Administered 2020-08-29 – 2020-09-06 (×16): 500 mg via ORAL
  Filled 2020-08-28 (×17): qty 1

## 2020-08-28 MED ORDER — LORAZEPAM 2 MG/ML IJ SOLN
INTRAMUSCULAR | Status: AC
  Administered 2020-08-28: 2 mg
  Filled 2020-08-28: qty 1

## 2020-08-28 MED ORDER — THIAMINE HCL 100 MG PO TABS: 100.0000 mg | ORAL_TABLET | Freq: Every day | ORAL | Status: DC

## 2020-08-28 MED ORDER — THIAMINE HCL 100 MG/ML IJ SOLN
Freq: Once | INTRAVENOUS | Status: AC
  Filled 2020-08-28: qty 1000

## 2020-08-28 MED ORDER — THIAMINE HCL 100 MG/ML IJ SOLN
250.0000 mg | Freq: Every day | INTRAVENOUS | Status: DC
  Administered 2020-08-31: 250 mg via INTRAVENOUS
  Filled 2020-08-28: qty 2.5

## 2020-08-28 MED ORDER — POTASSIUM & SODIUM PHOSPHATES 280-160-250 MG PO PACK
2.0000 | PACK | Freq: Three times a day (TID) | ORAL | Status: AC
  Administered 2020-08-29: 2 via ORAL
  Filled 2020-08-28 (×4): qty 2

## 2020-08-28 MED ORDER — FOLIC ACID 1 MG PO TABS
1.0000 mg | ORAL_TABLET | Freq: Every day | ORAL | Status: DC
  Administered 2020-08-29 – 2020-09-06 (×9): 1 mg via ORAL
  Filled 2020-08-28 (×9): qty 1

## 2020-08-28 MED ORDER — LORAZEPAM 2 MG/ML IJ SOLN
1.0000 mg | INTRAMUSCULAR | Status: AC | PRN
Start: 2020-08-28 — End: 2020-08-31
  Administered 2020-08-28 – 2020-08-29 (×3): 2 mg via INTRAVENOUS
  Administered 2020-08-29: 1 mg via INTRAVENOUS
  Administered 2020-08-29 – 2020-08-31 (×5): 2 mg via INTRAVENOUS
  Filled 2020-08-28 (×12): qty 1

## 2020-08-28 MED ORDER — PRAVASTATIN SODIUM 10 MG PO TABS: 20.0000 mg | ORAL_TABLET | Freq: Every day | ORAL | Status: DC

## 2020-08-28 NOTE — ED Notes (Signed)
Roommate Juanito Doom [phone] would like an update when available

## 2020-08-28 NOTE — ED Notes (Signed)
Mri called and stated pt was trying to rip everything off so I gave the ordered ativan IV.

## 2020-08-28 NOTE — ED Notes (Signed)
MRI called again and stated pt is still moving around, pulling at things and they can't get the pt's test done at this time.

## 2020-08-28 NOTE — Progress Notes (Signed)
Pt admitted from ED with hx of Seizures, pt alert and oriented to self only, follows simple command, settled in bed with call light at bedside, tele monitor put and verified on pt, safety measures initiated as well, was however reassured and will continue to monitor. Obasogie-Asidi, Landry Kamath Efe

## 2020-08-28 NOTE — ED Notes (Signed)
Pt had a BM. Pt was cleaned and provided new linen and gown.

## 2020-08-28 NOTE — ED Notes (Signed)
Pt noted to be having grandmal type seizure, PA at the bedside

## 2020-08-28 NOTE — ED Provider Notes (Signed)
[facility] EMERGENCY DEPART[facility] Provider Note   CSN: [id number] Arrival date & time: 08/28/20  [id number]     History Chief Complaint  Patient presents with  . Seizures    Dominic Carter is a 65 y.o. male who present via EMS.  He is unclear why EMS was initially called, however patient was initially alert and oriented but with left-sided tremors for EMS.  He was then witnessed to have generalized seizure in the ambulance.  It is unclear if he was administered any medications in route.  Seizure activity lasted 45 seconds with EMS, nasopharyngeal area was placed.  Patient was alert but disoriented upon arrival to the emergency department.   LEVEL 5 CAVEAT due to acuity of patient's presentation upon arrival.  I personally reviewed this patient's medical records.  He has history of alcohol abuse in the past with withdrawal that included seizure activity, concerning for delirium tremens.  Patient does have home dose of Keppra listed in his chart, however unclear if patient is adherent to this medication.  Most recent prescription ordered on 07/23/2020.   HPI     Past Medical History:  Diagnosis Date  . DVT (deep venous thrombosis) (HCC)   . Dyslipidemia   . ETOH abuse   . Hx of BKA, right (HCC)   . Hypertension   . Seizure disorder [facility](HCC)     Patient Active Problem List   Diagnosis Date Noted  . Dyslipidemia 01/30/2020  . Hepatitis, alcoholic, acute 01/30/2020  . Seizure (HCC) 01/20/2020  . ETOH abuse 01/20/2020  . HTN (hypertension) 01/20/2020  . 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 BELOW KNEE LEG AMPUTATION Right        Family History  Problem Relation Age of Onset  . Hypertension Mother   . Hypertension Sister   . Hypertension Brother   . Hypertension Maternal Grandmother   . Hypertension Maternal Grandfather     Social History   Tobacco Use  . Smoking status: Current Every Day  Smoker    Packs/day: 1.00    Types: Cigarettes  . Smokeless tobacco: Never Used  Vaping Use  . Vaping Use: Never used  Substance Use Topics  . Alcohol use: Yes    Comment: @ least 40 oz beer/day  . Drug use: No    Home Medications Prior to Admission medications   Medication Sig Start Date End Date Taking? Authorizing Provider  amLODipine (NORVASC) 10 MG tablet Take 1 tablet (10 mg total) by mouth daily. 02/09/20  Yes Medina-Vargas, Monina C, NP  famotidine (PEPCID) 40 MG tablet Take 40 mg by mouth every evening. 08/08/20  Yes [provider]  HYDROcodone-acetaminophen (NORCO/VICODIN) 5-325 MG tablet Take 1 tablet by mouth 4 (four) times daily as needed for moderate pain. 06/27/20  Yes [provider]  levETIRAcetam (KEPPRA) 500 MG tablet Take 1 tablet (500 mg total) by mouth 2 (two) times daily. 07/23/20  Yes Mast, Man X, NP  oxyCODONE (OXY IR/ROXICODONE) 5 MG immediate release tablet Take 5 mg by mouth every 4 (four) hours as needed for severe pain.   Yes [provider]  pravastatin (PRAVACHOL) 20 MG tablet Take 20 mg by mouth in the morning, at noon, and at bedtime. 07/12/20  Yes [provider]  Vitamin D, Ergocalciferol, (DRISDOL) 1.25 MG (50000 UNIT) CAPS capsule Take 1 capsule by mouth once a week. 07/12/20  Yes [provider]  aspirin EC 81  MG EC tablet Take 1 tablet (81 mg total) by mouth daily. Swallow whole. Patient not taking: Reported on 08/28/2020 01/27/20   Maretta Bees, MD  folic acid (FOLVITE) 1 MG tablet Take 1 tablet (1 mg total) by mouth daily. Patient not taking: Reported on 08/28/2020 02/09/20   Medina-Vargas, Monina C, NP  Multiple Vitamin (MULTIVITAMIN WITH MINERALS) TABS tablet Take 1 tablet by mouth daily. Patient not taking: Reported on 08/28/2020 01/27/20   Maretta Bees, MD  nicotine (NICODERM CQ - DOSED IN MG/24 HOURS) 21 mg/24hr patch Place 1 patch (21 mg total) onto the skin daily. Patient not taking: Reported on  08/28/2020 02/09/20   Medina-Vargas, Monina C, NP  thiamine 100 MG tablet Take 1 tablet (100 mg total) by mouth daily. Patient not taking: Reported on 08/28/2020 02/09/20   Medina-Vargas, Margit Banda, NP    Allergies    Patient has no known allergies.  Review of Systems   Review of Systems  Unable to perform ROS: Mental status change    Physical Exam Updated Vital Signs BP (!) 158/90   Pulse 87   Temp 98.4 F (36.9 C) (Temporal)   Resp 18   Ht 5\' 11"  (1.803 m)   Wt 68.7 kg   SpO2 100%   BMI 21.12 kg/m   Physical Exam Vitals and nursing note reviewed.  Constitutional:      Appearance: He is normal weight. He is ill-appearing. He is not toxic-appearing.  HENT:     Head: Normocephalic and atraumatic.     Nose: Nose normal.     Mouth/Throat:     Mouth: Mucous membranes are moist.     Comments: Unable to be performed due to seizure activity Eyes:     General: Lids are normal. Vision grossly intact.        Right eye: No discharge.        Left eye: No discharge.     Conjunctiva/sclera:     Right eye: Right conjunctiva is injected.     Left eye: Left conjunctiva is injected.     Pupils: Pupils are equal, round, and reactive to light.  Neck:     Trachea: Trachea and phonation normal.  Cardiovascular:     Rate and Rhythm: Regular rhythm. Tachycardia present.     Pulses: Normal pulses.          Right dorsalis pedis pulse not accessible.     Heart sounds: Normal heart sounds. No murmur heard.   Pulmonary:     Effort: Pulmonary effort is normal. Tachypnea present. No accessory muscle usage, prolonged expiration or respiratory distress.     Breath sounds: Normal breath sounds. No wheezing or rales.  Chest:     Chest wall: No mass, lacerations, deformity, swelling, tenderness, crepitus or edema.  Abdominal:     General: Bowel sounds are normal. There is no distension.     Palpations: Abdomen is soft.     Tenderness: There is no abdominal tenderness. There is no right CVA  tenderness, left CVA tenderness, guarding or rebound.  Musculoskeletal:        General: No deformity.     Right shoulder: Normal.     Left shoulder: Normal.     Right upper arm: Normal.     Left upper arm: Normal.     Right elbow: Normal.     Right forearm: Normal.     Left forearm: Normal.     Right wrist: Normal.     Left wrist:  Normal.     Right hand: Normal.     Left hand: Normal.     Cervical back: Normal, normal range of motion and neck supple. No edema, rigidity or crepitus. No pain with movement or spinous process tenderness.     Thoracic back: Normal.     Lumbar back: Normal.     Right hip: Normal.     Left hip: Normal.     Right upper leg: Normal.     Left upper leg: Normal.     Right knee: Normal.     Left knee: Normal.     Left lower leg: Normal. No edema.     Left ankle: Normal.     Left Achilles Tendon: Normal.     Left foot: Normal.     Right Lower Extremity: Right leg is amputated below knee.  Lymphadenopathy:     Cervical: No cervical adenopathy.  Skin:    General: Skin is warm and dry.     Capillary Refill: Capillary refill takes less than 2 seconds.  Neurological:     Comments: Patient was initially alert and speaking clearly when I entered the room, however shortly thereafter began to have generalized tonic-clonic seizure that lasted 90 seconds.  He was administered 2 mg of Ativan with resolution of the seizure, is postictal at this time, intermittently moving all 4 extremities, PERRL.  Psychiatric:        Mood and Affect: Mood normal.     ED Results / Procedures / Treatments   Labs (all labs ordered are listed, but only abnormal results are displayed) Labs Reviewed  BASIC METABOLIC PANEL - Abnormal; Notable for the following components:      Result Value   Sodium 134 (*)    CO2 17 (*)    Glucose, Bld 126 (*)    BUN 6 (*)    Anion gap 17 (*)    All other components within normal limits  CBC WITH DIFFERENTIAL/PLATELET - Abnormal; Notable for the  following components:   RDW 15.9 (*)    Platelets 111 (*)    All other components within normal limits  HEPATIC FUNCTION PANEL - Abnormal; Notable for the following components:   AST 178 (*)    ALT 102 (*)    Total Bilirubin 1.5 (*)    Bilirubin, Direct 0.5 (*)    Indirect Bilirubin 1.0 (*)    All other components within normal limits  URINALYSIS, ROUTINE W REFLEX MICROSCOPIC - Abnormal; Notable for the following components:   APPearance CLOUDY (*)    Bilirubin Urine SMALL (*)    Protein, ur 30 (*)    All other components within normal limits  ACETAMINOPHEN LEVEL - Abnormal; Notable for the following components:   Acetaminophen (Tylenol), Serum <10 (*)    All other components within normal limits  SALICYLATE LEVEL - Abnormal; Notable for the following components:   Salicylate Lvl <7.0 (*)    All other components within normal limits  AMMONIA - Abnormal; Notable for the following components:   Ammonia 73 (*)    All other components within normal limits  CBC - Abnormal; Notable for the following components:   Hemoglobin 12.8 (*)    RDW 16.5 (*)    Platelets 90 (*)    All other components within normal limits  URINALYSIS, MICROSCOPIC (REFLEX) - Abnormal; Notable for the following components:   Bacteria, UA FEW (*)    All other components within normal limits  CBG MONITORING, ED - Abnormal; Notable  for the following components:   Glucose-Capillary 129 (*)    All other components within normal limits  I-STAT VENOUS BLOOD GAS, ED - Abnormal; Notable for the following components:   pCO2, Ven 41.9 (*)    Calcium, Ion 1.09 (*)    All other components within normal limits  SARS CORONAVIRUS 2 (TAT 6-24 HRS)  URINE CULTURE  ETHANOL  LIPASE, BLOOD  CK  RAPID URINE DRUG SCREEN, HOSP PERFORMED  HEMOGLOBIN A1C  APTT  PROTIME-INR  COMPREHENSIVE METABOLIC PANEL  MAGNESIUM  PHOSPHORUS    EKG None  Radiology CT Head Wo Contrast  Result Date: 08/28/2020 CLINICAL DATA:  Seizure,  nontraumatic. EXAM: CT HEAD WITHOUT CONTRAST TECHNIQUE: Contiguous axial images were obtained from the base of the skull through the vertex without intravenous contrast. COMPARISON:  MRI January 20, 2020.  CT head January 19, 2020. FINDINGS: Streak artifact from the patient's hand (which they have rested on their left face/head) mildly limits evaluation. Within this limitation: Brain: No evidence of acute large vascular territory infarction, hemorrhage, hydrocephalus, extra-axial collection or mass lesion/mass effect. Similar appearance of a remote right posterior MCA territory infarct with associated encephalomalacia. Remote bilateral cerebellar lacunar infarcts. Similar ex vacuo ventricular dilation of the adjacent right lateral ventricle. Similar additional patchy white matter hypoattenuation, most likely related to chronic microvascular ischemic disease. Vascular: No hyperdense vessel identified. Skull: No acute fracture. Sinuses/Orbits: Moderate paranasal sinus disease, greatest in the left sphenoid sinus and right maxillary sinus right there is frothy secretions and air-fluid levels, but also involving scattered ethmoid air cells, right sphenoid sinus and left inferior maxillary sinus. Other: No mastoid effusions. IMPRESSION: 1. No evidence of acute intracranial abnormality 2. Remote right posterior MCA territory infarct and bilateral cerebellar lacunar infarcts. 3. Chronic microvascular ischemic change. 4. Moderate paranasal sinus mucosal thickening with frothy secretions and air-fluid levels in the right maxillary sinus and left sphenoid sinus. Electronically Signed   By: Feliberto Harts MD   On: 08/28/2020 12:27   Procedures .Critical Care Performed by: Paris Lore, PA-C Authorized by: Paris Lore, PA-C   Critical care provider statement:    Critical care time (minutes):  45   Critical care was time spent personally by me on the following activities:  Discussions with  consultants, evaluation of patient's response to treatment, examination of patient, ordering and performing treatments and interventions, ordering and review of laboratory studies, ordering and review of radiographic studies, pulse oximetry, re-evaluation of patient's condition, obtaining history from patient or surrogate and review of old charts   Medications Ordered in ED Medications  LORazepam (ATIVAN) tablet 1-4 mg (has no administration in time range)    Or  LORazepam (ATIVAN) injection 1-4 mg (has no administration in time range)  thiamine tablet 100 mg (has no administration in time range)    Or  thiamine (B-1) injection 100 mg (has no administration in time range)  folic acid (FOLVITE) tablet 1 mg (has no administration in time range)  multivitamin with minerals tablet 1 tablet (has no administration in time range)  0.9 %  sodium chloride infusion (has no administration in time range)  senna-docusate (Senokot-S) tablet 1 tablet (has no administration in time range)  amLODipine (NORVASC) tablet 10 mg (has no administration in time range)  levETIRAcetam (KEPPRA) tablet 500 mg (has no administration in time range)  pravastatin (PRAVACHOL) tablet 20 mg (has no administration in time range)  LORazepam (ATIVAN) 2 MG/ML injection (2 mg  Given 08/28/20 0958)  sodium chloride 0.9 %  1,000 mL with thiamine 100 mg, folic acid 1 mg, multivitamins adult 10 mL infusion ( Intravenous New Bag/Given 08/28/20 1206)  levETIRAcetam (KEPPRA) 2,500 mg in sodium chloride 0.9 % 250 mL IVPB (0 mg Intravenous Stopped 08/28/20 1141)    ED Course  I have reviewed the triage vital signs and the nursing notes.  Pertinent labs & imaging results that were available during my care of the patient were reviewed by me and considered in my medical decision making (see chart for details).  Clinical Course as of 08/28/20 1610  Tue Aug 28, 2020  1191 I presented to the patient's bedside to perform initial evaluation.   When I walked into the room patient was sitting up, talking and stating he felt he needed to vomit.  The patient did spit out significant out of saliva into the emesis basin and immediately thereafter began to have a generalized tonic-clonic seizure that lasted approximately 90 seconds.  I was alone in the room, stepped out into the hallway, and asked the pharmacist to grab some Ativan.  Patient was administered 2 mg of Ativan with cessation of seizure activity.  Suction was placed in the corner of his mouth while seizing, he was placed on nonrebreather and oxygen saturations dipped for approximately 60 seconds to the high 70s down to a low of 67% on room air.  He did regain oxygen saturation now 100% on nonrebreather mask.  He is postictal but moving all 4 extremities, PERRL.  2 additional milligrams of Ativan are available at the bedside, RN is at the bedside.  Concerned patient may be in delirium tremens given history of alcohol abuse, recent alcohol use, and nearly 48 hours without alcohol.  Patient does also have history of seizures supposed to be on Keppra, however as interview was not completed prior to seizure, I am unaware if patient is still on the Keppra.  We will proceed with broad work-up.  Patient will require admission to the hospital. [RS]  1215 Patient reevaluated, sleeping, will awaken to verbal stimulus and moving all 4 extremities.  Still fairly somnolent, requesting water to drink, will trial with ice chips.  Amenable to plan for admission. [RS]  1232 Consult to Dr. Viviano Simas, internal medicine, was agreeable to seeing patient admitting to service.  Appreciate his collaboration in the care of this patient. [RS]    Clinical Course User Index [RS] Laryssa Hassing, Idelia Salm   MDM Rules/Calculators/A&P                         65 year old male presents with concern for seizure activity witnessed by EMS and then again in the emergency department.  Concern for delirium tremens given reported  recent alcohol use and history of alcohol abuse, differential also includes Metabolic derangement, epileptic type seizure activity, meningitis, encephalitis, hemorrhage, or other drug toxicity.  Mild hypertensive on intake, vital signs otherwise normal.  Patient was initially saturating normally on room air.  Cardiac exam with tachycardia with regular rate, pulmonary exam complicated by transmitted upper airway noises following seizure as patient is postictal state.  Abdomen is soft, nondistended, nontender.  Patient did not have urinary or bowel incontinence with seizure activity, and there are no signs of deformities in his extremities, no skin changes.  There is a right BKA, with well-healed scarring at the stump.  We will proceed with broad work-up, EKG, and CT of the head.  Additional Ativan is available at the bedside should patient have repeat  seizure, additionally he will be administered a loading dose of Keppra.  CT head negative for acute intracranial abnormality.  Remote right posterior MCA infarct.  Bilateral lacunar cerebellar infarcts. CBC with anemia with hemoglobin of 12.8, thrombocytopenia of 111, BMP with anion gap of 17, hepatic function revealed transaminitis with elevated total bilirubin of 1.5.  Ethanol, salicylate, and acetaminophen levels are normal.  Lipase is normal.  CBG was normal on intake, i-STAT did not reveal acidosis.  UA with bilirubinemia and proteinuria.  EKG with sinus rhythm without STEMI.   Patient remains somnolent, however mental status is improving.  We will continue to monitor.  Concern for delirium tremens, will admit to the hospitalist.  Gerlene Burdock voiced understanding with medical evaluation and treatment plan.  Each of his questions was answered to his expressed satisfaction.  He is amenable plan for admission.  This chart was dictated using voice recognition software, Dragon. Despite the best efforts of this provider to proofread and correct errors, errors  may still occur which can change documentation meaning.  Final Clinical Impression(s) / ED Diagnoses Final diagnoses:  Seizure [facility])    Rx / DC Orders ED Discharge Orders    None       Sherrilee Gilles 08/28/20 1610    Sabino Donovan, MD 08/29/20 [phone]

## 2020-08-28 NOTE — ED Notes (Signed)
Pt's male purewick came off and pt urinated in bed. I cleaned pt and changed bed linen. I applied a new male purewick.

## 2020-08-28 NOTE — ED Notes (Signed)
MRI called again and stated pt is still moving around so I went over and gave another 2mg  of ativan.

## 2020-08-28 NOTE — ED Notes (Signed)
The male purewick came off of the pt and pt urinated all over bed. Cleaned pt up and applied a condom cath

## 2020-08-28 NOTE — ED Triage Notes (Signed)
Pt arrived via [facility] from home for a witnessed seizure by EMS. Per EMS pt is an alcoholic and his last drink was 2 days ago, but pt admitted to having a "swallow of beer" this morning. Per ems was having tremors on left side and was A&Ox4 initially. Per EMS pt had a grand mal seizure on route that lasted 30-45 sec. They placed an NPA in pt. Pt is alert, but confused now. Per EMS initial bp was 220/120. VSS at present.

## 2020-08-28 NOTE — ED Notes (Signed)
Pt returned back from CT.

## 2020-08-28 NOTE — ED Notes (Signed)
Pt transported to CT w/PA

## 2020-08-28 NOTE — ED Notes (Signed)
Patient transported to MRI 

## 2020-08-28 NOTE — Consult Note (Addendum)
Neurology Consultation  Reason for Consult: seizure activity Referring Physician: Wilfred Lacy., MD.  CC: witnessed seizure at home and en route with EMS.   History is obtained from: chart. Patient is unable to provide history at this time due to mental status.   HPI: Dominic Carter is a 65 y.o. male with a PMHx of ETOH hepatitis, ETOH abuse, HTN, HLD, DVT, past history of seizure, prior right MCA stroke, and right BKA.   Per chart, patient was at home this am watching TV and his hand began to shake. He sat down without LOC, fall, or striking of head. Unclear if patient had a seizure at his home and if it was witnessed. Also, unclear per chart as to why 911 was called. However, en route, patient had a 30-45 second GTC seizure with urinary and fecal incontinence. Patient was able to maintain his airway. Patient was loaded with Keppra 2500mg  IV in ED and Ativan 2mg . Per RN, he was more alert and less confused when he first came in and NP exam, per RN, is different than prior exam. When admitted, he told RN that he drank liquor daily and last drank a "bottle" 2 days ago. Patient is now unable to give reliable ETOH history.   In review of chart, last medical note NP can find is from an exam in SNF acute rehab in 01/2020 s/p hospital stay for seizure. EEG from that hospital stay in 10/21 was positive for activity suggestive of cortical dysfunction in right temporal region consistent with chronic right temporal infarct. No seizures or epileptiform discharges were seen. At that time, AEDs were started due to withdrawal from ETOH not thought to be a factor given patient's ethanol level was high on admission. Keppra 500mg  po bid was started due to risk of seizures given chronic infarct.   NP reviewed a year of OV notes in EMR and in care everywhere. NP can not find a PCP visit since 10/21. No out patient visits to a neurologist. There is note of BKA, but no reason documented for amputation. At one point,  patient told a provider here it was because of a DVT. He has changed PCPs, but it seems his last visit was with 11/21 PA-C at [facility] in [place], [place].   Work up in ED with LTS of 178/102, ASA level WDL, acetaminophen level WDL, Ethanol level < 10, ammonia 73. Even after 2mg  of Ativan x 2, patient was still uncooperative with MRI brain.   Neurology is asked to consult due to seizure activity.   ROS: A robust ROS was unable to be performed due to patient's altered mental status.    Past Medical History:  Diagnosis Date  . DVT (deep venous thrombosis) (HCC)   . Dyslipidemia   . ETOH abuse   . Hx of BKA, right (HCC)   . Hypertension   . Seizure disorder (HCC)   ETOH hepatitis.   Family History  Problem Relation Age of Onset  . Hypertension Mother   . Hypertension Sister   . Hypertension Brother   . Hypertension Maternal Grandmother   . Hypertension Maternal Grandfather     Social History:   reports that he has been smoking cigarettes. He has been smoking about 1.00 pack per day. He has never used smokeless tobacco. He reports current alcohol use. He reports that he does not use drugs. Per chart, 0.5 PPD x 43 years, so at least 21 pack year history.   Medications Current Facility-Administered Medications:  .  0.9 %  sodium chloride infusion, 75 mL/hr, Intravenous, Continuous, Dolan Amen, MD .  amLODipine (NORVASC) tablet 10 mg, 10 mg, Oral, Daily, Dolan Amen, MD .  Melene Muller ON 08/29/2020] folic acid (FOLVITE) tablet 1 mg, 1 mg, Oral, Daily, Dolan Amen, MD .  levETIRAcetam (KEPPRA) tablet 500 mg, 500 mg, Oral, BID, Dolan Amen, MD .  LORazepam (ATIVAN) tablet 1-4 mg, 1-4 mg, Oral, Q1H PRN **OR** LORazepam (ATIVAN) injection 1-4 mg, 1-4 mg, Intravenous, Q1H PRN, Dolan Amen, MD, 2 mg at 08/28/20 1703 .  [START ON 08/29/2020] multivitamin with minerals tablet 1 tablet, 1 tablet, Oral, Daily, Dolan Amen, MD .  potassium & sodium phosphates (PHOS-NAK)  280-160-250 MG packet 2 packet, 2 packet, Oral, TID WC & HS, Dolan Amen, MD .  senna-docusate (Senokot-S) tablet 1 tablet, 1 tablet, Oral, QHS PRN, Dolan Amen, MD .  Melene Muller ON 08/29/2020] thiamine tablet 100 mg, 100 mg, Oral, Daily **OR** [START ON 08/29/2020] thiamine (B-1) injection 100 mg, 100 mg, Intravenous, Daily, Dolan Amen, MD  Current Outpatient Medications:  .  amLODipine (NORVASC) 10 MG tablet, Take 1 tablet (10 mg total) by mouth daily., Disp: 30 tablet, Rfl: 0 .  famotidine (PEPCID) 40 MG tablet, Take 40 mg by mouth every evening., Disp: , Rfl:  .  HYDROcodone-acetaminophen (NORCO/VICODIN) 5-325 MG tablet, Take 1 tablet by mouth 4 (four) times daily as needed for moderate pain., Disp: , Rfl:  .  levETIRAcetam (KEPPRA) 500 MG tablet, Take 1 tablet (500 mg total) by mouth 2 (two) times daily., Disp: 60 tablet, Rfl: 0 .  oxyCODONE (OXY IR/ROXICODONE) 5 MG immediate release tablet, Take 5 mg by mouth every 4 (four) hours as needed for severe pain., Disp: , Rfl:  .  pravastatin (PRAVACHOL) 20 MG tablet, Take 20 mg by mouth in the morning, at noon, and at bedtime., Disp: , Rfl:  .  Vitamin D, Ergocalciferol, (DRISDOL) 1.25 MG (50000 UNIT) CAPS capsule, Take 1 capsule by mouth once a week., Disp: , Rfl:  .  aspirin EC 81 MG EC tablet, Take 1 tablet (81 mg total) by mouth daily. Swallow whole. (Patient not taking: Reported on 08/28/2020), Disp: 30 tablet, Rfl: 11 .  folic acid (FOLVITE) 1 MG tablet, Take 1 tablet (1 mg total) by mouth daily. (Patient not taking: Reported on 08/28/2020), Disp: 30 tablet, Rfl: 0 .  Multiple Vitamin (MULTIVITAMIN WITH MINERALS) TABS tablet, Take 1 tablet by mouth daily. (Patient not taking: Reported on 08/28/2020), Disp: , Rfl:  .  nicotine (NICODERM CQ - DOSED IN MG/24 HOURS) 21 mg/24hr patch, Place 1 patch (21 mg total) onto the skin daily. (Patient not taking: Reported on 08/28/2020), Disp: 28 patch, Rfl: 0 .  thiamine 100 MG tablet, Take 1 tablet (100  mg total) by mouth daily. (Patient not taking: Reported on 08/28/2020), Disp: 30 tablet, Rfl: 0   Exam: Current vital signs: BP (!) 141/70   Pulse 76   Temp 98.8 F (37.1 C) (Temporal)   Resp 17   Ht 5\' 11"  (1.803 m)   Wt 68.7 kg   SpO2 98%   BMI 21.12 kg/m  Vital signs in last 24 hours: Temp:  [98.4 F (36.9 C)-98.8 F (37.1 C)] 98.8 F (37.1 C) (05/24 1744) Pulse Rate:  [76-145] 76 (05/24 1724) Resp:  [14-27] 17 (05/24 1724) BP: (122-186)/(70-100) 141/70 (05/24 1721) SpO2:  [93 %-100 %] 98 % (05/24 1724) Weight:  [68.7 kg] 68.7 kg (05/24 0932)  PE: GENERAL: Poor appearing male restless in bed  with garbled speech and unreliable answers to questions. Awake.  HEENT: - Normocephalic and atraumatic. Arcus senilis.  LUNGS - Normal respiratory effort.  CV - RRR on tele ABDOMEN - Soft, nontender Ext: warm, well perfused. Right BKA.  Psych: uncooperative for most of exam. Picking at air and at NP watch and clothes. Restless.   NEURO:  Mental Status: Will state his name. Disoriented to day, month, date, or year. He does not know he is in the hospital. States he is at home and that he lives with his mother.  Speech/Language: speech is very garbled. Naming, repetition, fluency not intact.  Comprehension is questionably intact, but will follow intermittent commands.   Cranial Nerves:  II: PERRL 4 mm/brisk. visual fields full.  III, IV, VI: EOMI. Lid elevation symmetric and full.  V: sensation is intact and symmetrical to face. Blinks to threat.  VII: Smile is grossly symmetrical.  VIII:hearing intact to voice. IX, X: palate elevation is symmetric. Phonation normal.  XI: normal sternocleidomastoid and trapezius muscle strength IPJ:ASNKNL is symmetrical without fasciculations.   Motor: RUE: grips  5/5       triceps 5/5    biceps 5/5                 LUE: grips  5/5     triceps  5/5    biceps  5 /5            RLE:  Will not participate.             LLE:  Will not participate.   Movement is noted to LLE and Right stump.  Tone is normal. Bulk is decreased in RLE, rest appears normal.  Sensation- will not participate.  Coordination: Will not participate.  DTRs: RUE:  biceps  2+    brachioradialis  2+              RLE:  patella  1+            LUE:  biceps  2+     brachioradialis  2+             LLE: patella 1+      Gait- deferred due to AMS.   Labs I have reviewed labs in epic and the results pertinent to this consultation are: As per noted in HPI.   Imaging Uncooperative for MRI brain.   Assessment: 65 yo male with a history of ETOH withdrawal seizures and seizures unrelated to ETOH intake. He should be taking Keppra 500mg  po bid, but medication adherence is questionable due to his daily ETOH intake. His ethanol level today is < 10, so ETOH withdrawal seizure is a possibility. His ammonia level is 73 which is likely contributing to his AMS and decline in exam since admission and should be treated. Toxic metabolic encephalopathy may also be contributing to presentation as evidenced by AMS and patient should be worked up for other causes of his AMS besides seizure. ETOH withdrawal may also be contributing to his symptoms as evidenced by his undetectable Ethanol level, daily ETOH consumption with last drink 2 days ago.    Impression: -AMS, multifactorial with hyperammoniemia, ETOH withdrawal, and possible infection or other metabolic derangements.  -history of both seizures contributed to lack of consumption of ETOH prior to seizure and history of seizures even with elevated ETOH level.  -likely non adherence to medications, treatment plan, and no documented out patient neurology visits.   Recommendations/Plan:  -Medicine admit.  -EEG. If positive  or further seizures, may do LTM with video. Hx of cortical dysfunction on prior EEGs associated with chronic right temporal infarction. (2021).  -Continue Keppra 500mg  IV q12 hours for now. Should additional seizures be  noted, we may increase dose.  -given speech today and ETOH use, will increase Thiamine to high dose.  -Continue FA.  -Treat high ammonia level per medicine.  -Neurology will follow along.   Pt seen by Jimmye NormanKaren Kirby-Graham, NP/Neuro and later by MD. Note/plan to be edited by MD as needed.  Pager: [phone]  As seen the patient reviewed the above note.  His previous seizure was focal in nature and in the setting of his previous stroke, he is supposed to be on Keppra, though his compliance is unclear.  He is currently able to follow commands to show thumbs and lift legs, so low suspicion for ongoing seizure activity.  I would like an EEG, and if there is significant activity, could consider LTM especially if he does not improve overnight.  I suspect that his AMS is multifactorial, postictal state, alcohol withdrawal, hyperammonemia are all likely contributors.    If he returns to normal by tomorrow, may be able to hold off on MRI, but if he continues to be dysarthric then will be necessary.  Neurology will continue to follow.  Ritta SlotMcNeill Stanly Si, MD [facility] [phone]  If 7pm- 7am, please page neurology on call as listed in AMION.

## 2020-08-28 NOTE — Progress Notes (Signed)
Patient medicated for exam and would not cooperate and follow commands.  Patient attempting to get off table and raising up when attempting to obtain images.  Patient returned to ED.

## 2020-08-28 NOTE — H&P (Addendum)
Date: 08/28/2020               Patient Name:  Dominic Carter MRN: [id number]  DOB: 1955/09/07 Age / Sex: 65 y.o., male   PCP: Patient, No Pcp Per (Inactive)         Medical Service: Internal Medicine Teaching Service         Attending Physician: Dr. Mayford Knife, Dorene Ar, MD    First Contact: Dr. Leone Haven Pager: [phone]  Second Contact: Dr. Sande Brothers Pager: [phone]       After Hours (After 5p/  First Contact Pager: [phone]  weekends / holidays): Second Contact Pager: [phone]   Chief Complaint: Seizure  History of Present Illness:   Dominic Carter is a 65 y.o. male with hx of HTN, Dyslipidemia, h/o DVT, Alcoholic hepatitis, Alcohol abuse, tobacco use presented with witnessed seizure at home. Pt is poor historian, confused and has slurred speech. History completed by reviewing EMR. Pt states he was watching TV today in the morning and his hand started shaking. He denies any weakness or numbness. He states he sat down but didn't fall, lose consciousness or hit his head. He denies any changes in vision, hearing or speech. He drinks alcohol and reports drinking 1 bottle of liquor yesterday. He denies any chest pain, SOB, abdominal pain but reports some nausea and vomiting. He denies headache, fever or dizziness. He denies family history of seizures. He denies any drug use.   ED Course: Pt arrived by EMS from home for witnessed seizure by EMS. Per EMS, patient is alcoholic and his last drink was 2 days ago. Per EMS, he was having tremors on Left side and was A & Oriented x 4 initially. Per EMS, Pt had grand mal seizure on route that lasted 30-45 sec with incontinence of urine and stool. His BP was elevated initially to 220/120 mmHg with PR of 95. He was sating at 97% at RM. On exam, Pt was alert but confused. Oriented to place but not to time (Doesn't know month or year). CT head didn't show any evidence of acute intracranial abnormality however shows old infarcts. He was given 2 mg of  Ativan and loading dose of 2500 mg Keppra. Na 134, K 3.9, Mg 1.9, CO2 17, Glucose 126, Creatinine 1.04, Calcium 9.0. LFT elevated (AST-178 and ALT-102). Bilirubin 0.5, indirect bilirubin 1.0 and total bilirubin of 1.5.PT 14, INR 1.1, CBC wnl. Acetaminophen level <10, Salicylate level <7. Alcohol level <10. Ammonia level elevated to 73. EKG shows NS rhythm (HR 91/min).   Current Outpatient Medications  Medication Instructions  . amLODipine (NORVASC) 10 mg, Oral, Daily  . aspirin 81 mg, Oral, Daily, Swallow whole.  . famotidine (PEPCID) 40 mg, Oral, Every evening  . folic acid (FOLVITE) 1 mg, Oral, Daily  . HYDROcodone-acetaminophen (NORCO/VICODIN) 5-325 MG tablet 1 tablet, Oral, 4 times daily PRN  . levETIRAcetam (KEPPRA) 500 mg, Oral, 2 times daily  . Multiple Vitamin (MULTIVITAMIN WITH MINERALS) TABS tablet 1 tablet, Oral, Daily  . nicotine (NICODERM CQ - DOSED IN MG/24 HOURS) 21 mg, Transdermal, Daily  . oxyCODONE (OXY IR/ROXICODONE) 5 mg, Oral, Every 4 hours PRN  . pravastatin (PRAVACHOL) 20 mg, Oral, 3 times daily  . thiamine 100 mg, Oral, Daily  . Vitamin D, Ergocalciferol, (DRISDOL) 1.25 MG (50000 UNIT) CAPS capsule 1 capsule, Oral, Weekly    Allergies as of 08/28/2020  . (No Known Allergies)    Past Medical History:  Diagnosis Date  . DVT (  deep venous thrombosis) (HCC)   . Dyslipidemia   . ETOH abuse   . Hx of BKA, right (HCC)   . Hypertension   . Seizure disorder [facility])     Past Surgical History:  Procedure Laterality Date  . BELOW KNEE LEG AMPUTATION Right     Family History  Problem Relation Age of Onset  . Hypertension Mother   . Hypertension Sister   . Hypertension Brother   . Hypertension Maternal Grandmother   . Hypertension Maternal Grandfather     Social History   Tobacco Use  . Smoking status: Current Every Day Smoker    Packs/day: 1.00    Types: Cigarettes  . Smokeless tobacco: Never Used  Vaping Use  . Vaping Use: Never used  Substance Use  Topics  . Alcohol use: Yes    Comment: @ least 40 oz beer/day  . Drug use: No    Review of Systems: Unable to do ROS due to confusion  Physical Exam: Blood pressure (!) 142/85, pulse 89, temperature 98.4 F (36.9 C), temperature source Temporal, resp. rate 18, height 5\' 11"  (1.803 m), weight 68.7 kg, SpO2 97 %. Physical Exam Vitals and nursing note reviewed.  Constitutional:      General: He is in acute distress.     Appearance: He is ill-appearing and toxic-appearing. He is not diaphoretic.  HENT:     Head: Normocephalic.     Mouth/Throat:     Mouth: Mucous membranes are moist.  Cardiovascular:     Rate and Rhythm: Regular rhythm. Tachycardia present.     Pulses: Normal pulses.     Heart sounds: No murmur heard.   Pulmonary:     Effort: Pulmonary effort is normal.     Breath sounds: Normal breath sounds. No wheezing, rhonchi or rales.  Abdominal:     General: Bowel sounds are normal. There is no distension.     Palpations: There is no mass.     Tenderness: There is no abdominal tenderness.  Musculoskeletal:     Right lower leg: No edema.     Left lower leg: No edema.     Comments: Right Leg below knee amputated.  Skin:    General: Skin is warm and dry.     Coloration: Skin is jaundiced.  Neurological:     General: No focal deficit present.     Mental Status: He is alert.     Comments: NEURO Exam:   Mental Status: Alert, Oriented to self, place and situation only. Not oriented to time. Poor Attention and Concentration. Language: speech is slurred.   Cranial Nerves:   CN II- XII- intact. Motor: No Drift in Upper Extremities, No Drift in LE's. Strength 5/5 in Rt UE, 5/5 in Lt UE, Strength in Lt leg - 5/5. Rt leg amputated. Tone: is normal and bulk is normal Sensation- Intact to light touch bilaterally Gait- deferred.    EKG: personally reviewed my interpretation is NS rhythm (VR- 91/min)  CT Head Wo Contrast  Result Date: 08/28/2020 CLINICAL DATA:  Seizure,  nontraumatic. EXAM: CT HEAD WITHOUT CONTRAST TECHNIQUE: Contiguous axial images were obtained from the base of the skull through the vertex without intravenous contrast. COMPARISON:  MRI January 20, 2020.  CT head January 19, 2020. FINDINGS: Streak artifact from the patient's hand (which they have rested on their left face/head) mildly limits evaluation. Within this limitation: Brain: No evidence of acute large vascular territory infarction, hemorrhage, hydrocephalus, extra-axial collection or mass lesion/mass effect. Similar appearance of  a remote right posterior MCA territory infarct with associated encephalomalacia. Remote bilateral cerebellar lacunar infarcts. Similar ex vacuo ventricular dilation of the adjacent right lateral ventricle. Similar additional patchy white matter hypoattenuation, most likely related to chronic microvascular ischemic disease. Vascular: No hyperdense vessel identified. Skull: No acute fracture. Sinuses/Orbits: Moderate paranasal sinus disease, greatest in the left sphenoid sinus and right maxillary sinus right there is frothy secretions and air-fluid levels, but also involving scattered ethmoid air cells, right sphenoid sinus and left inferior maxillary sinus. Other: No mastoid effusions. IMPRESSION: 1. No evidence of acute intracranial abnormality 2. Remote right posterior MCA territory infarct and bilateral cerebellar lacunar infarcts. 3. Chronic microvascular ischemic change. 4. Moderate paranasal sinus mucosal thickening with frothy secretions and air-fluid levels in the right maxillary sinus and left sphenoid sinus. Electronically Signed   By: Feliberto Harts MD   On: 08/28/2020 12:27    Assessment & Plan by Problem: Active Problems:   * No active hospital problems. *  Essential HTN HLD H/o DVT Seizure Alcohol abuse disorder Alcohol Hepatitis Tobacco use disorder  Dominic Carter is a 65 y.o. male with hx of HTN, h/o DVT, Dyslipidemia, Alcoholic hepatitis,  Alcoholic abuse, tobacco use presented with witnessed seizure at home.   Seizure Alcohol withdrawal Seizure Vs. Focal Etiology Pt reported that his arm was shaking. Per EMS, Pt had grand mal seizure on route that last 30-45 sec. Pt received loading dose of Keppra in the ED. CT head ruled out any acute pathology. H/o alcohol abuse.  Pt has h/o focal seizure in the past. He was last admitted to [facility] from [facility]/19/[facility]- 01/27/20 with similar presentation. EEG at that time didn't show any seizure activity. At that time he was seen by Neurology and started on Keppra 500 mg BID for suspected focal etiology. Not sure about adherence.  Alcohol withdrawal seizure vs Focal Etiology. Stroke unlikely in the absence of any focal neurological deficit.  Will order MRI to rule out focal lesion.  -Neurology consulted. Will see Pt today. -Frequent neuro checks.  -MRI Brain -Seizure precautions.  -Monitor vitals.  -Aspiration precautions. -Fall precautions  -Cardiac monitoring  -Continue Keppra 500 mg BID -NPO -Iv fluids 75 ml/hr  Alcohol abuse H/O alcohol abuse. Drinks alcohol everyday. Reports drinking 1 bottle of liquor yesterday. Pt was having nausea and vomiting in the ED. On exam, tremors +nt. -CIWA -Ativan withdrawal protocol -Multivitamin with minerals -Thiamine injection today followed by tablet daily -Folic acid 1 mg daily.  Alcoholic hepatitis Liver enzymes elevated. AST - 178, ALT-102. Bilirubin 0.5, indirect bilirubin 1.0 and total bilirubin of 1.5. INR 1.1, PT 14, On examination, icterus present. Abdomen not distended. Ammonia level elevated to 73. -Monitor signs ans symptoms. -Consider Usg abdomen to rule out cirrhosis.  -Monitor LFT .  Essential HTN  H/o essential HTN. Per EMS,  BP elevated to 220/120 mmHg. Most recent BP-146/79 mmHg. Home meds includes Norvasc 10 mg daily. -Continue Home Norvasc 10 mg Daily.  Tobacco use disorder Per EMR, he smokes 1 ppd.  -Home meds includes Nicotine  patch 21 mg daily  HLD Hold home pravastatin 20 mg Daily due to elevated liver enzymes   Dispo: Admit patient to Inpatient with expected length of stay greater than 2 midnights.  Signed: Karsten Ro, MD 08/28/2020, 2:34 PM  Pager: [phone] After 5pm on weekdays and 1pm on weekends: On Call pager: [phone]

## 2020-08-29 ENCOUNTER — Inpatient Hospital Stay (HOSPITAL_COMMUNITY): Payer: Medicare Other

## 2020-08-29 DIAGNOSIS — R569 Unspecified convulsions: Secondary | ICD-10-CM | POA: Diagnosis not present

## 2020-08-29 LAB — COMPREHENSIVE METABOLIC PANEL
ALT: 77 U/L — ABNORMAL HIGH (ref 0–44)
AST: 108 U/L — ABNORMAL HIGH (ref 15–41)
Albumin: 3 g/dL — ABNORMAL LOW (ref 3.5–5.0)
Alkaline Phosphatase: 78 U/L (ref 38–126)
Anion gap: 9 (ref 5–15)
BUN: 5 mg/dL — ABNORMAL LOW (ref 8–23)
CO2: 20 mmol/L — ABNORMAL LOW (ref 22–32)
Calcium: 8.5 mg/dL — ABNORMAL LOW (ref 8.9–10.3)
Chloride: 105 mmol/L (ref 98–111)
Creatinine, Ser: 0.87 mg/dL (ref 0.61–1.24)
GFR, Estimated: >60 mL/min (ref >60)
Glucose, Bld: 80 mg/dL (ref 70–99)
Potassium: 4.4 mmol/L (ref 3.5–5.1)
Sodium: 134 mmol/L — ABNORMAL LOW (ref 135–145)
Total Bilirubin: 1.5 mg/dL — ABNORMAL HIGH (ref 0.3–1.2)
Total Protein: 6.3 g/dL — ABNORMAL LOW (ref 6.5–8.1)

## 2020-08-29 LAB — CBC
HCT: 37.1 % — ABNORMAL LOW (ref 39.0–52.0)
Hemoglobin: 12.8 g/dL — ABNORMAL LOW (ref 13.0–17.0)
MCH: 31 pg (ref 26.0–34.0)
MCHC: 34.5 g/dL (ref 30.0–36.0)
MCV: 89.8 fL (ref 80.0–100.0)
Platelets: 96 10*3/uL — ABNORMAL LOW (ref 150–400)
RBC: 4.13 MIL/uL — ABNORMAL LOW (ref 4.22–5.81)
RDW: 16 % — ABNORMAL HIGH (ref 11.5–15.5)
WBC: 9.9 10*3/uL (ref 4.0–10.5)
nRBC: 0 % (ref 0.0–0.2)

## 2020-08-29 LAB — URINE CULTURE: Culture: <10000 — AB

## 2020-08-29 LAB — HEMOGLOBIN A1C
Hgb A1c MFr Bld: 5.5 % (ref 4.8–5.6)
Mean Plasma Glucose: 111 mg/dL

## 2020-08-29 IMAGING — DX DG CHEST 1V PORT
1 series · 1 of 1 positions shown · non-contrast
Comparison: [DATE]

CLINICAL DATA: Follow-up episode of aspiration.

EXAM:
PORTABLE CHEST 1 VIEW

[chest ap]
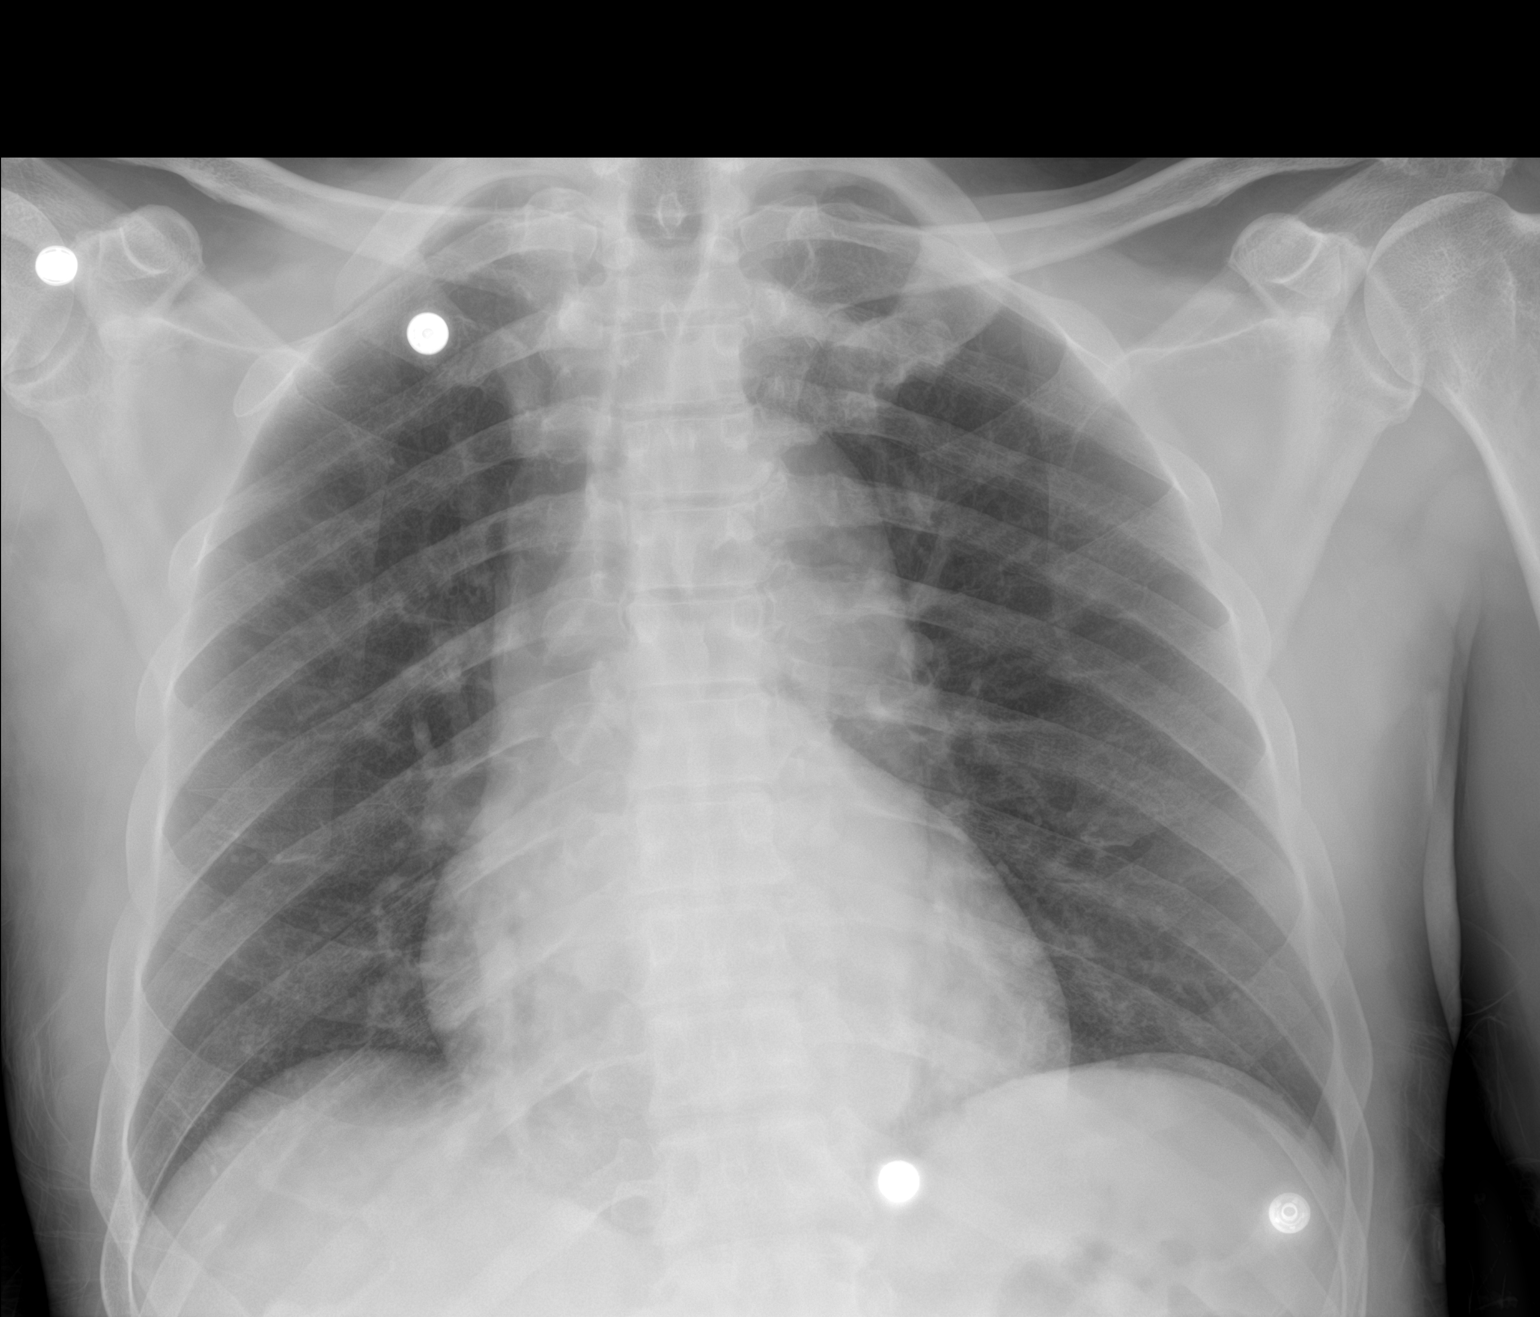

[1 of 1 positions shown; findings below may reference images not displayed]

FINDINGS: Heart and mediastinal shadows are normal. The lungs are clear. No
infiltrate, collapse or effusion. No significant bone finding.
IMPRESSION: No active disease.

## 2020-08-29 NOTE — Progress Notes (Addendum)
Neurology Progress Note  Patient ID: ARK AGRUSA is a 65 y.o. with PMHx of  has a past medical history of DVT (deep venous thrombosis) (HCC), Dyslipidemia, ETOH abuse, BKA, right (HCC), Hypertension, and Seizure disorder (HCC).  Initially consulted for: seizure activity    Major interval events: none    Subjective:  no complaints at this time. Wishes to be left alone.   Exam: Vitals:   08/28/20 2343 08/29/20 0554  BP: 137/86 (!) 152/85  Pulse: 74 83  Resp: 20 19  Temp: 98.2 F (36.8 C) 98.4 F (36.9 C)  SpO2: 100% 100%   Gen: poor appearing black male  In bed, comfortable Resp: non-labored breathing, no grossly audible wheezing Cardiac: Perfusing extremities well  Abd: soft, nt  Neuro: MS: improved from prior day evaluation. Oriented to person, place, date, and year. He knows he is in the hospital. Reports that he lives with his sister in law (reported that he lived with his mother yesterday). Speech is thick but able to be understood at this time. Naming is intact. Comprehension appears to be mostly intact as he is able to follow commands.    Cranial Nerves:  II: PERRL 4 mm/brisk. visual fields full.  III, IV, VI: EOMI. Lid elevation symmetric and full.  V: sensation is intact and symmetrical to face. Blinks to threat.  VII: Smile is grossly symmetrical.  VIII:hearing intact to voice. IX, X: palate elevation is symmetric. Phonation normal.  XI: normal sternocleidomastoid and trapezius muscle strength JKD:TOIZTI is symmetrical without fasciculations.   Motor: RUE: grips  5/5       triceps 5/5    biceps 5/5                 LUE: grips  5/5     triceps  5/5    biceps  5 /5            RLE:  able to elevate right stump off bed             LLE:  Will not participate.   Movement is noted to LLE and Right stump from BKA. Tone is normal. Bulk is decreased in RLE, rest appears normal.  Sensation- unable to fully assess   Coordination: unable to fully assess.  DTRs: RUE:   biceps  2+    brachioradialis  2+              RLE:  patella  1+            LUE:  biceps  2+     brachioradialis  2+             LLE: patella 1+      Gait- deferred due to AMS.   Pertinent Labs:  Results for MEILECH, VIRTS (MRN [id number]) as of 08/29/2020 08:08  Ref. Range 08/28/2020 09:39  Alcohol, Ethyl (B) Latest Ref Range: <10 mg/dL <82    Impression: 65 yo male with a history of ETOH withdrawal seizures and seizures unrelated to ETOH intake. He should be taking Keppra 500mg  po bid, but medication adherence is questionable due to his daily ETOH intake.  AMS, multifactorial with hyperammoniemia, ETOH withdrawal, and possible infection or other metabolic derangements.  -history of both seizures contributed to lack of consumption of ETOH prior to seizure and history of seizures even with elevated ETOH level.  -likely non adherence to medications, treatment plan, and no documented out patient neurology visits.  He appears much improved today.  Recommendations: -continue with keppra 500mg  q12 hours for now.  Treat high ammonia level per medicine. Folic acid as ordered.  Alcohol disease management. No need for EEG.  We will sign off at this time. Patient should follow up with neurology when discharged.   Above discussed with Dr. .

## 2020-08-29 NOTE — Progress Notes (Signed)
Subjective: Patient is still very drowsy and not able to answer all questions.  Answered some questions.  Patient thinks that he is here for stroke.  Denies any weakness, numbness.  Reports some nausea denies vomiting.  Reports cough states it is due to smoking.  Patient states that she he smokes 1 pack a day.  Able to follow some commands.   Objective:  Vital signs in last 24 hours: Vitals:   08/28/20 2100 08/28/20 2115 08/28/20 2343 08/29/20 0554  BP: 125/80 (!) 142/77 137/86 (!) 152/85  Pulse:  80 74 83  Resp: (!) 23 (!) 21 20 19   Temp:   98.2 F (36.8 C) 98.4 F (36.9 C)  TempSrc:   Axillary Oral  SpO2:  97% 100% 100%  Weight:      Height:       CBC Latest Ref Rng & Units 08/29/2020 08/28/2020 08/28/2020  WBC 4.0 - 10.5 K/uL 9.9 8.2 -  Hemoglobin 13.0 - 17.0 g/dL 12.8(L) 12.8(L) 14.3  Hematocrit 39.0 - 52.0 % 37.1(L) 40.0 42.0  Platelets 150 - 400 K/uL 96(L) 90(L) -   CMP Latest Ref Rng & Units 08/29/2020 08/28/2020 08/28/2020  Glucose 70 - 99 mg/dL 80 08/30/2020) -  BUN 8 - 23 mg/dL 5(L) 5(L) -  Creatinine 0.61 - 1.24 mg/dL 578(I 6.96 -  Sodium 2.95 - 145 mmol/L 134(L) 134(L) 137  Potassium 3.5 - 5.1 mmol/L 4.4 3.7 4.0  Chloride 98 - 111 mmol/L 105 102 -  CO2 22 - 32 mmol/L 20(L) 22 -  Calcium 8.9 - 10.3 mg/dL 284) 8.3(L) -  Total Protein 6.5 - 8.1 g/dL 6.3(L) 6.6 -  Total Bilirubin 0.3 - 1.2 mg/dL 1.3(K) 2.2(H) -  Alkaline Phos 38 - 126 U/L 78 84 -  AST 15 - 41 U/L 108(H) 138(H) -  ALT 0 - 44 U/L 77(H) 84(H) -    Physical Exam Constitutional: Ill looking, drowsy and confused. HEENT -icterus present, eyes mildly disconjugate, pupils equal and reactive to light.  Not able to follow commands for extraocular muscle.  cardiovascular: regular rate and rhythm, normal heart sounds Pulmonary: effort normal, some grunting/snoring noises present bilaterally otherwise lungs clear to ascultation. Abdominal: flat, non distended. Musculoskeletal: No edema, Rt leg below-knee  amputated. Skin: warm and dry Neurological:  drowsy and confused. Able to follow some commands PEERL, not able to assess EOM due to pt not able to follow commands.  Cranial nerve  II- XII intact . no focal deficits.  Strength 5/5 in right upper extremity, 5/5 left upper extremity.  Strength 5/5 in left leg. right leg amputated  Assessment/Plan: Dominic Carter is a 65 y.o. male with hx of HTN, h/o DVT, Dyslipidemia, Alcoholic hepatitis, Alcoholic abuse, tobacco use presented with witnessed seizure at home.  Active Problems:   * No active hospital problems. 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 Pt came with c/o seizure.  Focal seizure due to prior stroke likely provoked by alcohol withdrawal. Couldn't do MRI yesterday as Pt was moving. Pt got Ativan x3.  AMS multifactorial - alcohol withdrawal, Postictal state, possible infection.   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  Started on Keppra in October 2021. Keppra adherence questionable. Neuro recommended high dose Thiamine , IV Keppra 500 mg BID.  Neurology signed off.  Patient do not need EEG.  -Neurology Following. Appreciate recs -  Cancel EEG and MRI. -Frequent neuro checks.  -Seizure precautions.  -Monitor vitals.  -Aspiration precautions. -Fall precautions  -Cardiac monitoring  -Continue Keppra 500 mg BID oral with sips. -NPO -Iv fluids Nacl 75 ml/hr -Chest x-ray to rule out aspiration pneumonia as he is complaining of cough.   Alcohol abuse H/O alcohol abuse. Reports drinking 1 bottle of liquor 2 days ago. On exam, tremors +nt. -CIWA- 11 Yesterday night -Ativan withdrawal protocol -Multivitamin with minerals -Thiamine and Folic acid.   Alcoholic hepatitis Liver enzymes  Improved from yesterday. Today ALT 108, ALT 77, Total protein  6.3, Total bilirubin 1.5. Albumin 3.2 On examination, icterus present. Abdomen not distended. Ammonia level elevated to 73. -Monitor signs and symptoms. -Continue to monitor LFT .  Essential HTN  H/o essential HTN. Per EMS,  BP elevated to 220/120 mmHg. Most recent BP-152/85 mmHg. Home meds includes Norvasc 10 mg daily. -Continue Home Norvasc 10 mg Daily.  Tobacco use disorder Per EMR, he smokes 1 ppd. Home meds includes Nicotine patch 21 mg daily.   HLD hold pravastatin 20 mg Daily due to elevated liver enzymes.  Diet: N.p.o. IVF: NS 75 mL/h VTE: SCDs Prior to Admission Living Arrangement: Home Anticipated Discharge Location: Home Barriers to Discharge: Medical work-up Dispo: Anticipated discharge in approximately 2-3 day(s).   Karsten Ro, MD 08/29/2020, 6:47 AM Pager: [phone] After 5pm on weekdays and 1pm on weekends: On Call pager [phone]

## 2020-08-29 NOTE — Plan of Care (Signed)
Not progressing due to altered mental status.   Problem: Health Behavior/Discharge Planning: Goal: Ability to manage health-related needs will improve Outcome: Not Progressing   Problem: Clinical Measurements: Goal: Ability to maintain clinical measurements within normal limits will improve Outcome: Not Progressing Goal: Will remain free from infection Outcome: Not Progressing Goal: Diagnostic test results will improve Outcome: Not Progressing   Problem: Nutrition: Goal: Adequate nutrition will be maintained Outcome: Not Progressing   Problem: Coping: Goal: Level of anxiety will decrease Outcome: Not Progressing   Problem: Elimination: Goal: Will not experience complications related to bowel motility Outcome: Not Progressing Goal: Will not experience complications related to urinary retention Outcome: Not Progressing   Problem: Pain Managment: Goal: General experience of comfort will improve Outcome: Not Progressing   Problem: Safety: Goal: Ability to remain free from injury will improve Outcome: Not Progressing   Problem: Skin Integrity: Goal: Risk for impaired skin integrity will decrease Outcome: Not Progressing   Problem: Coping: Goal: Ability to adjust to condition or change in health will improve Outcome: Not Progressing   Problem: Health Behavior/Discharge Planning: Goal: Compliance with prescribed medication regimen will improve Outcome: Not Progressing   Problem: Medication: Goal: Risk for medication side effects will decrease Outcome: Not Progressing   Problem: Clinical Measurements: Goal: Complications related to the disease process, condition or treatment will be avoided or minimized Outcome: Not Progressing Goal: Diagnostic test results will improve Outcome: Not Progressing

## 2020-08-30 ENCOUNTER — Inpatient Hospital Stay (HOSPITAL_COMMUNITY): Payer: Medicare Other

## 2020-08-30 DIAGNOSIS — R569 Unspecified convulsions: Secondary | ICD-10-CM

## 2020-08-30 LAB — CBC
HCT: 39.2 % (ref 39.0–52.0)
Hemoglobin: 13.4 g/dL (ref 13.0–17.0)
MCH: 30.7 pg (ref 26.0–34.0)
MCHC: 34.2 g/dL (ref 30.0–36.0)
MCV: 89.7 fL (ref 80.0–100.0)
Platelets: 105 10*3/uL — ABNORMAL LOW (ref 150–400)
RBC: 4.37 MIL/uL (ref 4.22–5.81)
RDW: 15.1 % (ref 11.5–15.5)
WBC: 5.4 10*3/uL (ref 4.0–10.5)
nRBC: 0 % (ref 0.0–0.2)

## 2020-08-30 LAB — COMPREHENSIVE METABOLIC PANEL
ALT: 65 U/L — ABNORMAL HIGH (ref 0–44)
AST: 86 U/L — ABNORMAL HIGH (ref 15–41)
Albumin: 3 g/dL — ABNORMAL LOW (ref 3.5–5.0)
Alkaline Phosphatase: 77 U/L (ref 38–126)
Anion gap: 11 (ref 5–15)
BUN: 5 mg/dL — ABNORMAL LOW (ref 8–23)
CO2: 21 mmol/L — ABNORMAL LOW (ref 22–32)
Calcium: 8.3 mg/dL — ABNORMAL LOW (ref 8.9–10.3)
Chloride: 102 mmol/L (ref 98–111)
Creatinine, Ser: 0.8 mg/dL (ref 0.61–1.24)
GFR, Estimated: >60 mL/min (ref >60)
Glucose, Bld: 63 mg/dL — ABNORMAL LOW (ref 70–99)
Potassium: 3.2 mmol/L — ABNORMAL LOW (ref 3.5–5.1)
Sodium: 134 mmol/L — ABNORMAL LOW (ref 135–145)
Total Bilirubin: 1.8 mg/dL — ABNORMAL HIGH (ref 0.3–1.2)
Total Protein: 6.6 g/dL (ref 6.5–8.1)

## 2020-08-30 IMAGING — DX DG HAND COMPLETE 3+V*L*
3 series · 3 of 3 positions shown · non-contrast
Comparison: None.

CLINICAL DATA: Fall, combative

EXAM:
LEFT HAND - COMPLETE 3+ VIEW

[hand pa]
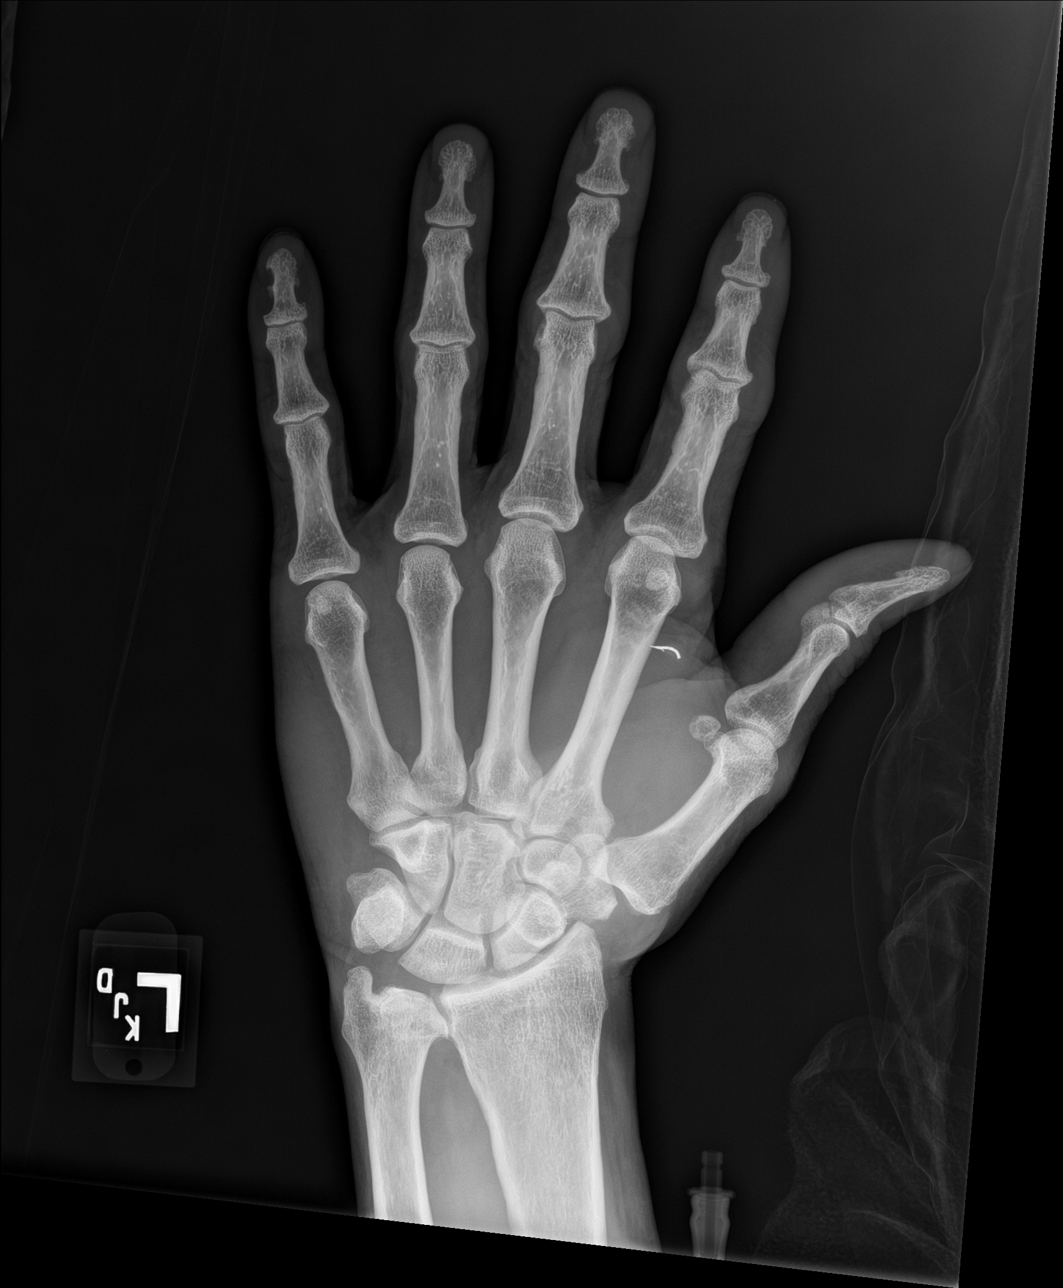

[hand obl]
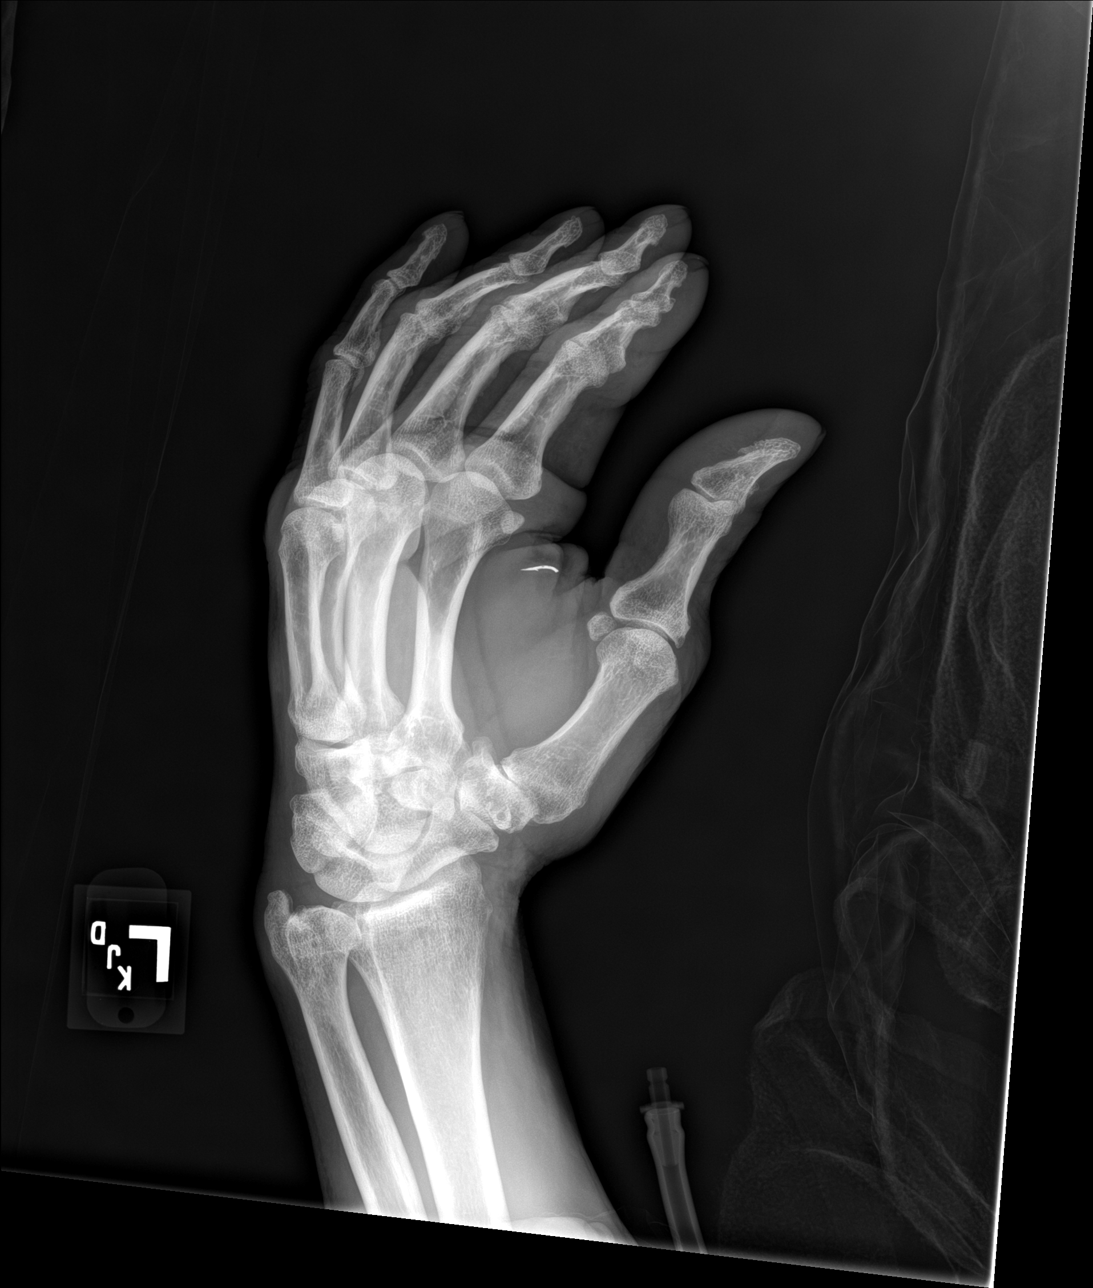

[hand lat]
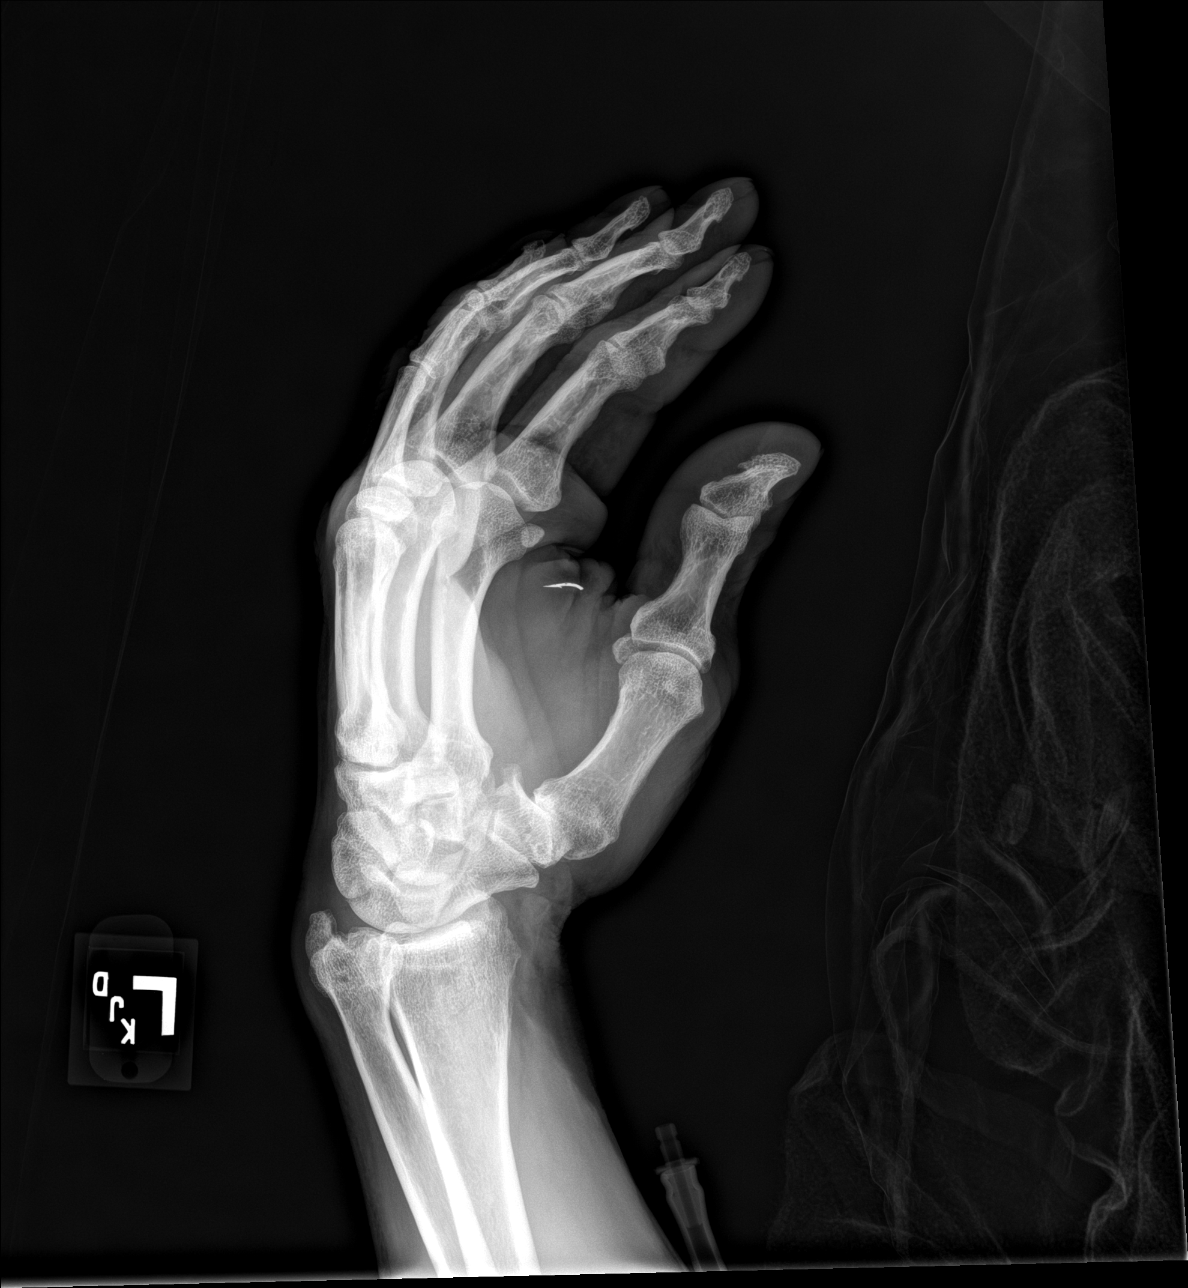

[3 of 3 positions shown; findings below may reference images not displayed]

FINDINGS: No fracture or malalignment. Mild degenerative changes at the first
CMC joint. 6 mm metallic PASQUALE shaped foreign body within the soft
tissues at the level of the distal second metacarpal.
IMPRESSION: 1. No acute osseous abnormality.
2. 6 mm metallic bar shaped foreign body adjacent to the second
metacarpal

## 2020-08-30 MED ORDER — LORAZEPAM 2 MG/ML IJ SOLN
2.0000 mg | Freq: Once | INTRAMUSCULAR | Status: AC
  Administered 2020-08-30: 2 mg via INTRAVENOUS

## 2020-08-30 MED ORDER — LISINOPRIL 2.5 MG PO TABS
2.5000 mg | ORAL_TABLET | Freq: Every day | ORAL | Status: DC
  Administered 2020-08-30 – 2020-09-06 (×8): 2.5 mg via ORAL
  Filled 2020-08-30 (×8): qty 1

## 2020-08-30 MED ORDER — POTASSIUM CHLORIDE 20 MEQ PO PACK
40.0000 meq | PACK | Freq: Once | ORAL | Status: AC
  Administered 2020-08-30: 40 meq via ORAL
  Filled 2020-08-30: qty 2

## 2020-08-30 NOTE — Discharge Summary (Signed)
Name: Dominic Carter MRN: [id number] DOB: [birth year] 65 y.o. PCP: Patient, No Pcp Per (Inactive)  Date of Admission: 08/28/2020  9:26 AM Date of Discharge: 09/06/20 Attending Physician: Inez Catalina, MD  Discharge Diagnosis: 1. Focal seizures  2. Alcohol abuse 3. Alcoholic hepatitis 4. Essential HTN 5. Hyperlipidemia  Discharge Medications: Allergies as of 09/06/2020   No Known Allergies     Medication List    STOP taking these medications   HYDROcodone-acetaminophen 5-325 MG tablet Commonly known as: NORCO/VICODIN   oxyCODONE 5 MG immediate release tablet Commonly known as: Oxy IR/ROXICODONE   Vitamin D (Ergocalciferol) 1.25 MG (50000 UNIT) Caps capsule Commonly known as: DRISDOL     TAKE these medications   amLODipine 10 MG tablet Commonly known as: NORVASC Take 1 tablet (10 mg total) by mouth daily.   aspirin 81 MG EC tablet Take 1 tablet (81 mg total) by mouth daily. Swallow whole.   famotidine 40 MG tablet Commonly known as: PEPCID Take 40 mg by mouth every evening.   folic acid 1 MG tablet Commonly known as: FOLVITE Take 1 tablet (1 mg total) by mouth daily.   Hydrocortisone Max St 1 % Generic drug: hydrocortisone cream Apply 1 application topically 2 (two) times daily.   levETIRAcetam 500 MG tablet Commonly known as: KEPPRA Take 1 tablet (500 mg total) by mouth 2 (two) times daily.   lisinopril 2.5 MG tablet Commonly known as: ZESTRIL Take 1 tablet (2.5 mg total) by mouth daily.   multivitamin with minerals Tabs tablet Take 1 tablet by mouth daily.   nicotine 21 mg/24hr patch Commonly known as: NICODERM CQ - dosed in mg/24 hours Place 1 patch (21 mg total) onto the skin daily.   pravastatin 20 MG tablet Commonly known as: PRAVACHOL Take 1 tablet (20 mg total) by mouth daily. What changed: when to take this   thiamine 100 MG tablet Take 1 tablet (100 mg total) by mouth daily.            Durable Medical Equipment  (From  admission, onward)         Start     Ordered   09/05/20 1546  For home use only DME standard manual wheelchair with seat cushion  Once       Comments: Patient suffers from amputation which impairs their ability to perform daily activities like bathing, dressing, feeding, grooming, and toileting in the home.  A cane, crutch, or walker will not resolve issue with performing activities of daily living. A wheelchair will allow patient to safely perform daily activities. Patient can safely propel the wheelchair in the home or has a caregiver who can provide assistance. Length of need Lifetime. Accessories: elevating leg rests (ELRs), wheel locks, extensions and anti-tippers.   09/05/20 1546   09/05/20 1544  For home use only DME Walker rolling  Once       Question Answer Comment  Walker: With 5 Inch Wheels   Patient needs a walker to treat with the following condition Immobility      09/05/20 1546          Disposition and follow-up:   Dominic Carter was discharged from [facility] in Stable condition.  At the hospital follow up visit please address:  1.  Follow up: Marland Kitchen Follow up with IM Clinic in 1 week for follow up.  2.  Labs / imaging needed at time of follow-up: CMP  3.  Pending labs/ test needing follow-up: None  Follow-up Appointments:  Follow-up Information    [facility]. Schedule an appointment as soon as possible for a visit in 1 week(s).   Contact information: [address] [place] [address] [phone]              Hospital Course by problem list: Focal seizures due to prior stroke in the setting of alcohol withdrawal. Pt presented with witnessed seizure by EMS.  He was given 2 mg of Ativan and loading dose of 2500 mg Keppra. 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 Neurology consulted. Recommended continuing Keppra 500 mg BID and high dose Thiamine. Not able to do MRI as Pt was moving constantly. Didn't need EEG. Focal seizure due to prior stroke likely provoked by alcohol withdrawal. Pt was put on seizure, aspiration and fall precautions. Recommended to continue Keppra 500 mg BID.  Alcohol abuse Pt reported drinking quart, sometimes the whole quart" and drink whatever liquor he can get. Pt was put on CIWA ativan withdrawal protocol and started on Folic acid and Thiamine. Pt became agitated multiple times and received Ativan which made him drowsy. His mental status improved over time with less withdrawal symptoms.TOC consulted for substance abuse counseling and substance abuse rehab placement. His discharge got delayed because of his placement to Rehab. He was evaluated by PT before discharge. Pt is going to [facility] after discharge today. Alcoholic hepatitis Liver enzymes elevated at admission AST 178 and ALT 102 with total bilirubin of 1.5.  LFT monitored and improved with last  AST 83 and ALT 60.   Essential HTN  H/o essential HTN. Most recent BP-220/120mg  Started on Home meds Norvasc 10 mg daily. Due to continued elevated BP with systolic in 150's , Lisinopril 2.5 mg daily was added to the regimen.   HLD Home pravastatin was held due to elevated liver enzymes and alcohol use. Restarted at discharge.  Subjective on day of discharge: Pt feeling good. Denies any new complaints. States he needs his glasses and wheelchair before he goes to rehab today.   Discharge Exam:   BP 95/69 (BP Location: Left Arm)   Pulse 79   Temp 98.5 F (36.9 C) (Oral)   Resp 19   Ht 5\' 11"  (1.803 m)   Wt 68.7 kg   SpO2 100%   BMI 21.12 kg/m  Discharge exam: Physical Exam Vitals reviewed.  Constitutional:      General: He is not in acute distress.    Appearance: Normal appearance. He is  normal weight. He is not ill-appearing, toxic-appearing or diaphoretic.  HENT:     Head: Normocephalic and atraumatic.  Cardiovascular:     Rate and Rhythm: Normal rate and regular rhythm.  Pulmonary:     Effort: Pulmonary effort is normal.  Abdominal:     General: Abdomen is flat. There is no distension.  Musculoskeletal:     Comments: Rt leg below knee amputated.  Skin:    General: Skin is warm and dry.  Neurological:     General: No focal deficit present.     Mental Status: He is alert and oriented to person, place, and time.  Psychiatric:        Mood and Affect: Mood normal.        Behavior: Behavior normal.    Pertinent Labs, Studies, and Procedures:  CT Head Wo Contrast  Result Date: 08/28/2020 CLINICAL DATA:  Seizure, nontraumatic. EXAM: CT HEAD WITHOUT CONTRAST TECHNIQUE: Contiguous axial images were obtained from the base of the skull through the vertex without intravenous contrast. COMPARISON:  MRI January 20, 2020.  CT head January 19, 2020. FINDINGS: Streak artifact from the patient's hand (which they have rested on their left face/head) mildly limits evaluation. Within this limitation: Brain: No evidence of acute large vascular territory infarction, hemorrhage, hydrocephalus, extra-axial collection or mass lesion/mass effect. Similar appearance of a remote right posterior MCA territory infarct with associated encephalomalacia. Remote bilateral cerebellar lacunar infarcts. Similar ex vacuo ventricular dilation of the adjacent right lateral ventricle. Similar additional patchy white matter hypoattenuation, most likely related to chronic microvascular ischemic disease. Vascular: No hyperdense vessel identified. Skull: No acute fracture. Sinuses/Orbits: Moderate paranasal sinus disease, greatest in the left sphenoid sinus and right maxillary sinus right there is frothy secretions and air-fluid levels, but also involving scattered ethmoid air cells, right sphenoid sinus and left  inferior maxillary sinus. Other: No mastoid effusions. IMPRESSION: 1. No evidence of acute intracranial abnormality 2. Remote right posterior MCA territory infarct and bilateral cerebellar lacunar infarcts. 3. Chronic microvascular ischemic change. 4. Moderate paranasal sinus mucosal thickening with frothy secretions and air-fluid levels in the right maxillary sinus and left sphenoid sinus. Electronically Signed   By: Feliberto Harts MD   On: 08/28/2020 12:27   DG CHEST PORT 1 VIEW  Result Date: 08/29/2020 CLINICAL DATA:  Follow-up episode of aspiration. EXAM: PORTABLE CHEST 1 VIEW COMPARISON:  01/19/2020 FINDINGS: Heart and mediastinal shadows are normal. The lungs are clear. No infiltrate, collapse or effusion. No significant bone finding. IMPRESSION: No active disease. Electronically Signed   By: Paulina Fusi M.D.   On: 08/29/2020 11:07   Discharge Instructions: Discharge Instructions    Diet - low sodium heart healthy   Complete by: As directed    Increase activity slowly   Complete by: As directed      Dear Dominic Carter,   Thank you for letting us participate in your care! In this section, you will find a brief hospital admission summary of why you were admitted to the hospital, what happened during your admission, your diagnosis/diagnoses, and recommended follow up.   You were admitted because you were experiencing Seizure and alcohol withdrawl.   Your testing revealed past stroke and Alcohol withdrawal.   You were diagnosed with Seizure due to past stroke exacerbated by alcohol withdrawal.  You were treated with Antiepileptics and Benzodiazepines.   You were also seen by Neurology. They recommended antiepileptics.   Your symptoms improved and you were discharged from the hospital for meeting this goal.   POST-HOSPITAL & CARE INSTRUCTIONS 1. Follow up with IM for follow up after Rehab. Please call to make appointment.  2. Please let PCP/Specialists know of any changes in  medications that were made.  3. Please see medications section of this packet for any medication changes.   DOCTOR'S APPOINTMENTS & FOLLOW UP No future appointments.   Thank you for choosing [facility]! Take care and be well!  Internal Medicine Teaching Service Inpatient Team [place]  [facility]  [address] [place], [place] [address] [phone]    Signed: Karsten Ro, MD 09/06/2020, 10:35 AM   Pager: [phone]

## 2020-08-30 NOTE — TOC Initial Note (Addendum)
Transition of Care [facility]) - Initial/Assessment Note    Patient Details  Name: Dominic Carter MRN: [id number] Date of Birth: 1955/04/16  Transition of Care [facility]) CM/SW Contact:    Bethann Berkshire, [facility] Phone Number: 08/30/2020, 3:18 PM  Clinical Narrative:                  CSW met with pt for substance use consult and being informed by MD that pt may need inpatient substance use tx. Pt is restless in bed trying to get up but is able to calm down some and speak with CSW. Pt states he lives at home with his niece Dominic Carter. He consents to CSW contacting Dominic Carter if needed. Pt is unclear when providing his substance use history but is able to inform CSW that he drinks about a 5th and a half of liquor daily. He reports he has never been to treatment before. CSW explains possible residential treatment options. Pt is apprehensive about distance of facilities available but is agreeable to referral for [facility] treatment center.  CSW called niece Dominic Carter; no answer and voice mailbox is full.   Other possible facilities that take pt's insurance included [facility] which is local but they are in process of moving physical locations and not accepting pts.  [facility] in [place] may take pt's insurance if pt is willing to go that far.  [facility] in [place] may take medicare and may help with transportation  CSW called to make referral to [place] tx center. Faxed to [phone]  1545: CSW received return call from [facility]. CSW provided background information regarding pt's admission and substance use history. They are going to attempt to call pt on room phone for screening. RN notified and will attempt to assist. [facility] admissions can be reached at [phone] CSW met with pt in room and notified him. He is agreeable to participate in phone screening. CSW asks about how pt gets around at home as he will need to be independent for residential tx. He states he uses an Transport planner. He states he has no  problem using a walker as well.   Expected Discharge Plan:  (Substance use residential treatment) Barriers to Discharge: Continued Medical Work up   Patient Goals and CMS Choice Patient states their goals for this hospitalization and ongoing recovery are:: "if they got a bed I will go"      Expected Discharge Plan and Services Expected Discharge Plan:  (Substance use residential treatment)       Living arrangements for the past 2 months: Single Family Home                                      Prior Living Arrangements/Services Living arrangements for the past 2 months: Single Family Home Lives with:: Relatives Patient language and need for interpreter reviewed:: Yes        Need for Family Participation in Patient Care: Yes (Comment) Care giver support system in place?: No (comment)   Criminal Activity/Legal Involvement Pertinent to Current Situation/Hospitalization: No - Comment as needed  Activities of Daily Living      Permission Sought/Granted   Permission granted to share information with : Yes, Verbal Permission Granted  Share Information with NAME: Dominic Carter (Daughter)   [phone] (Home Phone)           Emotional Assessment Appearance:: Appears stated age Attitude/Demeanor/Rapport: Unable to Assess  Affect (typically observed): Restless Orientation: : Oriented to Self,Oriented to Place,Oriented to Situation Alcohol / Substance Use: Alcohol Use Psych Involvement: No (comment)  Admission diagnosis:  Seizure [facility]) [R56.9] Patient Active Problem List   Diagnosis Date Noted  . Dyslipidemia 01/30/2020  . Hepatitis, alcoholic, acute 47/65/4650  . Seizure ([place]) 01/20/2020  . ETOH abuse 01/20/2020  . HTN (hypertension) 01/20/2020  . Chronic ischemic right MCA stroke 01/20/2020   PCP:  Patient, No Pcp Per (Inactive) Pharmacy:   [place], [address]  [place]  [address] Phone: [phone] Fax: [phone]     Social Determinants of Health (SDOH) Interventions    Readmission Risk Interventions No flowsheet data found.

## 2020-08-30 NOTE — Progress Notes (Signed)
Pt was found on the floor of the room by RN. Pt does report pain at the Left hand. MD and family notified. VS are stable and patient is stable. Awaiting X-ray of hand. RN will continue to monitor.

## 2020-08-30 NOTE — Progress Notes (Signed)
UPDATE NOTE:  Notified that pt was becoming more agitated, hitting and kicking staff, this evening. He has received 6mg  total of IV ativan this evening.  Pt seen at bedside. He is agitated and restless. He is moving all four extremities without any gross deficits.  Suspect that this is related to progression of withdrawal. Given the absence of any apparent focal neurologic deficits, I have a low suspicion for this to be related to his fall earlier. Symptoms are more consistent with encephalopathy.  Will give another 2mg  of IV ativan. If no improvement after that, bedside RN is aware to notify at which time we will probably need to due physical restraints to avoid harm to staff or himself. If he develops any focal neurologic changes, I would consider obtaining a head CT to rule out ICH, however he would not be able to remain still for long enough at this point anyway.  , MD Internal Medicine Resident PGY-2 [place] [facility] Residency Pager: [phone] 08/30/2020 10:22 PM

## 2020-08-30 NOTE — Progress Notes (Addendum)
Subjective: .  Patient examined at bedside. States he is feeling better than yesterday.  States he is ready "get up, go about, and go home" States he is in hospital because of a stroke. Discussed that he did not have a stroke but did have a seizure. Discussed drinking alcohol is a risk factor for seizure. States he last drank alcohol "around the weekend". When asked about alcohol he reports "sometimes half of a quart, sometimes the whole quart" and drink whatever liquor he can get. Endorses prior chest pain the middle of the chest the night before coming to hospital. Kept him from sleeping, tried to turn to get more comfortable so he can sleep. Rates it 8/10. Drinking makes it worse but otherwise does not bother him. States it has gotten better since he hasn't drunk anything. State he is not planning to drink at discharge. Would like to stop drinking and "find something else to do".  Encouraged taking to social work to help quit drinking. Patient would like to talk to his roommate first to help him make a decision. Thinks if he get into rehab maybe it will stop him from drinking and take his mind off alcohol.  Endorses being hungry. Would like to try eating. Also notes chronic congestion improves with coughing.  Later today, Pt states he lives with Niece and has wheelchair and walker at home. Doesn't want need PT.  Objective:  Vital signs in last 24 hours: Vitals:   08/29/20 1946 08/29/20 2347 08/30/20 0344 08/30/20 0800  BP: (!) 148/91 (!) 157/90 (!) 154/68 117/81  Pulse: 98 88 86 96  Resp: 17 18 18 18   Temp: 98.6 F (37 C) 98.6 F (37 C) 98.2 F (36.8 C) 98.3 F (36.8 C)  TempSrc: Axillary Oral  Oral  SpO2: 98%   98%  Weight:      Height:       CBC Latest Ref Rng & Units 08/30/2020 08/29/2020 08/28/2020  WBC 4.0 - 10.5 K/uL 5.4 9.9 8.2  Hemoglobin 13.0 - 17.0 g/dL 08/30/2020 12.8(L) 12.8(L)  Hematocrit 39.0 - 52.0 % 39.2 37.1(L) 40.0  Platelets 150 - 400 K/uL 105(L) 96(L) 90(L)   CMP Latest  Ref Rng & Units 08/30/2020 08/29/2020 08/28/2020  Glucose 70 - 99 mg/dL 08/30/2020) 80 64(W)  BUN 8 - 23 mg/dL 5(L) 5(L) 5(L)  Creatinine 0.61 - 1.24 mg/dL 803(O 1.22 4.82  Sodium 135 - 145 mmol/L 134(L) 134(L) 134(L)  Potassium 3.5 - 5.1 mmol/L 3.2(L) 4.4 3.7  Chloride 98 - 111 mmol/L 102 105 102  CO2 22 - 32 mmol/L 21(L) 20(L) 22  Calcium 8.9 - 10.3 mg/dL 8.3(L) 8.5(L) 8.3(L)  Total Protein 6.5 - 8.1 g/dL 6.6 5.00) 6.6  Total Bilirubin 0.3 - 1.2 mg/dL 3.7(C) 4.8(G) 2.2(H)  Alkaline Phos 38 - 126 U/L 77 78 84  AST 15 - 41 U/L 86(H) 108(H) 138(H)  ALT 0 - 44 U/L 65(H) 77(H) 84(H)    Physical Exam Constitutional: Ill looking, drowsy and confused. HEENT -icterus present, eyes mildly disconjugate Pulmonary: effort normal, Som expiratory wheezing In Left Lower lungs. No rales/rhonchi. Clear to auscultation on Rt side. Abdominal: Flat , non distended Musculoskeletal: No Edema, Rt leg below knee amputated. Skin: warm, Eczematous spot present in Rt upper back. Dry looking b/l feet.  Neurological:  Alert and less drowsy and more oriented than before. Oriented to self. Thinks its march or April. 2002 but knows current president. No focal deficits Assessment/Plan: Dominic Carter is a 65 y.o.  male with hx of HTN, h/o DVT, Dyslipidemia, Alcoholic hepatitis, Alcoholic abuse, tobacco use presented with witnessed seizure at home.  Active Problems:   * No active hospital problems. 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 Focal seizure due to prior stroke likely provoked by alcohol withdrawal. 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  On Keppra 500 mg BID.  Neurology signed off. CXR Normal.  -Neurology consulted. Appreciate recs  -Seizure, Aspiration and fall precautions.  -Continue Keppra 500 mg BID oral with sips. -Start  Diet.  -Stop IV fluids  Alcohol abuse Drinks quart, sometimes the whole quart" and drink whatever liquor he can get. Feels anxious sometimes. Wants to quit alcohol. Last drink around the weekend -CIWA- 15 Yesterday night at 2200 -Ativan withdrawal protocol -Multivitamin with minerals -Thiamine and Folic acid.  -TOC consult for substance abuse counseling, possible substance abuse rehab placement.  Alcoholic hepatitis Liver enzymes  Improved from yesterday. Today ALT 86, ALT 65, Total protein 6.6, Total bilirubin 1.8. Albumin 3.0 On examination, Mild icterus present.  -Monitor LFT .  Essential HTN  H/o essential HTN. Most recent BP-154/68 mmHg. Home meds includes Norvasc 10 mg daily. -Continue Home Norvasc 10 mg Daily. -Start Lisinopril 2.5 mg daily  HLD Holding pravastatin 20 mg Daily due to elevated liver enzymes.  Diet: Regular IVF: None VTE: SCDs Prior to Admission Living Arrangement: Home Anticipated Discharge Location: Home Barriers to Discharge: [facility] consult for substance abuse education/ rehab placement  Dispo: Anticipated discharge in 0-1 day.   Karsten Ro, MD 08/30/2020, 10:45 AM Pager: [phone] After 5pm on weekdays and 1pm on weekends: On Call pager [phone]

## 2020-08-31 LAB — COMPREHENSIVE METABOLIC PANEL
ALT: 60 U/L — ABNORMAL HIGH (ref 0–44)
AST: 83 U/L — ABNORMAL HIGH (ref 15–41)
Albumin: 3.1 g/dL — ABNORMAL LOW (ref 3.5–5.0)
Alkaline Phosphatase: 75 U/L (ref 38–126)
Anion gap: 7 (ref 5–15)
BUN: 9 mg/dL (ref 8–23)
CO2: 22 mmol/L (ref 22–32)
Calcium: 9 mg/dL (ref 8.9–10.3)
Chloride: 106 mmol/L (ref 98–111)
Creatinine, Ser: 0.91 mg/dL (ref 0.61–1.24)
GFR, Estimated: >60 mL/min (ref >60)
Glucose, Bld: 98 mg/dL (ref 70–99)
Potassium: 4 mmol/L (ref 3.5–5.1)
Sodium: 135 mmol/L (ref 135–145)
Total Bilirubin: 1.5 mg/dL — ABNORMAL HIGH (ref 0.3–1.2)
Total Protein: 6.6 g/dL (ref 6.5–8.1)

## 2020-08-31 MED ORDER — LORAZEPAM 2 MG/ML IJ SOLN
2.0000 mg | Freq: Once | INTRAMUSCULAR | Status: AC
  Administered 2020-08-31: 2 mg via INTRAVENOUS
  Filled 2020-08-31: qty 1

## 2020-08-31 MED ORDER — THIAMINE HCL 100 MG PO TABS
250.0000 mg | ORAL_TABLET | Freq: Every day | ORAL | Status: AC
  Administered 2020-09-01 – 2020-09-05 (×5): 250 mg via ORAL
  Filled 2020-08-31 (×5): qty 3

## 2020-08-31 MED ORDER — LORAZEPAM 2 MG/ML IJ SOLN
1.0000 mg | INTRAMUSCULAR | Status: AC | PRN
  Administered 2020-09-01 – 2020-09-03 (×2): 1 mg via INTRAVENOUS
  Filled 2020-08-31 (×3): qty 1

## 2020-08-31 MED ORDER — THIAMINE HCL 100 MG PO TABS
100.0000 mg | ORAL_TABLET | Freq: Every day | ORAL | Status: DC
  Administered 2020-09-06: 100 mg via ORAL
  Filled 2020-08-31: qty 1

## 2020-08-31 MED ORDER — LORAZEPAM 1 MG PO TABS: 1.0000 mg | ORAL_TABLET | ORAL | Status: AC | PRN

## 2020-08-31 NOTE — Plan of Care (Signed)
  Problem: Health Behavior/Discharge Planning: Goal: Ability to manage health-related needs will improve Outcome: Progressing   Problem: Clinical Measurements: Goal: Ability to maintain clinical measurements within normal limits will improve Outcome: Progressing Goal: Will remain free from infection Outcome: Progressing Goal: Diagnostic test results will improve Outcome: Progressing   Problem: Nutrition: Goal: Adequate nutrition will be maintained Outcome: Progressing   Problem: Coping: Goal: Level of anxiety will decrease Outcome: Progressing   Problem: Elimination: Goal: Will not experience complications related to bowel motility Outcome: Progressing Goal: Will not experience complications related to urinary retention Outcome: Progressing   Problem: Pain Managment: Goal: General experience of comfort will improve Outcome: Progressing   Problem: Safety: Goal: Ability to remain free from injury will improve Outcome: Progressing   Problem: Skin Integrity: Goal: Risk for impaired skin integrity will decrease Outcome: Progressing   Problem: Coping: Goal: Ability to adjust to condition or change in health will improve Outcome: Progressing   Problem: Health Behavior/Discharge Planning: Goal: Compliance with prescribed medication regimen will improve Outcome: Progressing   Problem: Medication: Goal: Risk for medication side effects will decrease Outcome: Progressing   Problem: Clinical Measurements: Goal: Complications related to the disease process, condition or treatment will be avoided or minimized Outcome: Progressing Goal: Diagnostic test results will improve Outcome: Progressing   Problem: Safety: Goal: Verbalization of understanding the information provided will improve Outcome: Progressing   Problem: Self-Concept: Goal: Ability to verbalize feelings about condition will improve Outcome: Progressing   Problem: Safety: Goal: Non-violent  Restraint(s) Outcome: Progressing   Problem: Safety: Goal: Violent Restraint(s) Outcome: Progressing   

## 2020-08-31 NOTE — TOC Progression Note (Signed)
Transition of Care [facility]) - Progression Note    Patient Details  Name: Dominic Carter MRN: [id number] Date of Birth: 19-Feb-1956  Transition of Care [facility]) CM/SW Contact  Erin Sons, [place] Phone Number: 08/31/2020, 3:45 PM  Clinical Narrative:     CSW received voicemail from [place] at [facility]. CSW called back; Wyn Forster is busy but CSW is able to speak with Morrie Sheldon. Morrie Sheldon explained that next step is that pt would need to complete a "pre screen" interview over the phone. CSW explained that pt is not fully oriented right now due to withdrawal. Pt could possibly complete prescreen if improving tomorrow. Pt would need to call [phone] and state his name and need to do pre screen. [facility] can do admissions over the weekend but bed availability is limited. [place] at [facility] will be working over the weekend and can be reached at [phone]. CSW provided [facility] with weekend CSW contact info.    Expected Discharge Plan:  (Substance use residential treatment) Barriers to Discharge: Continued Medical Work up  Expected Discharge Plan and Services Expected Discharge Plan:  (Substance use residential treatment)       Living arrangements for the past 2 months: Single Family Home                                       Social Determinants of Health (SDOH) Interventions    Readmission Risk Interventions No flowsheet data found.

## 2020-08-31 NOTE — Progress Notes (Addendum)
Subjective:  At 4.51 pm yesterday- Pt found at floor by nurse yesterday, Pt c/o Pain in left hand. X rays were done and no osseous abnormality was found.  Overnight at 10.15pm- Pt was agitated, hitting and kicking staff- He received 6 mg Ativan in the evening prior to the episode. Got another 2 mg. Soft restrains and 1:1 sitter ordered.  Pt reported diarrhea yesterday. Enteric precautions in place. C diff stool ordered.  Pt seen this morning at rounds. Very drowsy with mumbling speech which was difficult to understand. States he was feeling anxious. Not able to answer all questions or follow commands.   Objective:  Vital signs in last 24 hours: Vitals:   08/30/20 1651 08/30/20 1942 08/30/20 2320 08/31/20 0352  BP: (!) 144/95 139/79 129/76 137/87  Pulse: 96 88 84 86  Resp: 20 17 18 18   Temp: 98.3 F (36.8 C) 97.7 F (36.5 C) 97.9 F (36.6 C) 98.7 F (37.1 C)  TempSrc: Oral Axillary Axillary Temporal  SpO2: 98% 97% 97% 99%  Weight:      Height:       CBC Latest Ref Rng & Units 08/30/2020 08/29/2020 08/28/2020  WBC 4.0 - 10.5 K/uL 5.4 9.9 8.2  Hemoglobin 13.0 - 17.0 g/dL 08/30/2020 12.8(L) 12.8(L)  Hematocrit 39.0 - 52.0 % 39.2 37.1(L) 40.0  Platelets 150 - 400 K/uL 105(L) 96(L) 90(L)   CMP Latest Ref Rng & Units 08/31/2020 08/30/2020 08/29/2020  Glucose 70 - 99 mg/dL 98 08/31/2020) 80  BUN 8 - 23 mg/dL 9 5(L) 5(L)  Creatinine 0.61 - 1.24 mg/dL 83(M 6.29 4.76  Sodium 135 - 145 mmol/L 135 134(L) 134(L)  Potassium 3.5 - 5.1 mmol/L 4.0 3.2(L) 4.4  Chloride 98 - 111 mmol/L 106 102 105  CO2 22 - 32 mmol/L 22 21(L) 20(L)  Calcium 8.9 - 10.3 mg/dL 9.0 5.46) 5.0(P)  Total Protein 6.5 - 8.1 g/dL 6.6 6.6 5.4(S)  Total Bilirubin 0.3 - 1.2 mg/dL 5.6(C) 1.2(X) 5.1(Z)  Alkaline Phos 38 - 126 U/L 75 77 78  AST 15 - 41 U/L 83(H) 86(H) 108(H)  ALT 0 - 44 U/L 60(H) 65(H) 77(H)    Physical Exam Constitutional: Drowsy and confused looking man in restrains. Sitter present in the room. Oriented to self, Not  oriented to place, person, situation or time.  Head- Normocephalic Pulmonary: Normal effort. Breathing well at room air.  Abdominal: mildly distended, non tender.  Musculoskeletal: No Edema, Rt leg below knee amputated. Skin: warm, Dry Neurological: Drowsy looking. Oriented to self only. Not oriented to place, person and time. Moves all extremities equally.  Assessment/Plan: MARKEVIOUS EHMKE is a 65 y.o. male with hx of HTN, h/o DVT, Dyslipidemia, Alcoholic hepatitis, Alcoholic abuse, tobacco use presented with witnessed seizure at home.  Active Problems:   * No active hospital problems. * Alcohol abuse Alcoholic hepatitis Seizure Essential hypertension Tobacco use disorder  Seizure Focal seizures due to prior stroke in the setting of alcohol withdrawal. Focal seizure due to prior stroke likely provoked by alcohol withdrawal. 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  On Keppra 500 mg BID. No more seizures reported.  -Neurology consulted. Appreciate recs  -Seizure, Aspiration and fall precautions.  -Continue Keppra 500 mg BID.  Alcohol abuse and withdrawal   Pt was agitated and hitting staff at night. CIWA- 13 at 5 am ,Ativan 2 mg given this morning. Pt drowsy now. Oriented to self only.  AMS likely related to alcohol withdrawal and Ativan.  TOC consulted for Rehab placement however Pt not stable today. Will f/u once pt is medically stable.  -Ativan withdrawal protocol -Multivitamin with minerals -Thiamine and Folic acid.  -TOC consulted for substance abuse counseling, possible substance abuse rehab placement. They are going to attempt to call pt on room phone for screening. But Pt not stable today. Will follow up with CSW once pt is medically stable.  -1:1 sitter to prevent harm to Pt and staff.  Diarrhea  Reported diarrhea yesterday to on call resident. Likely related to Alcohol withdrawal. C diff sent. No diarrhea reported today.  Vitals stable. -Encourage oral fluid intake.  -F/U C diff -Monitor signs and symptoms.   Alcoholic hepatitis (Improved) Liver enzymes Improved since admission. Today ALT 86, ALT 65 -Will stop monitoring now.   Essential HTN  H/o essential HTN. Most recent BP-137/87 mmHg. -Continue Home Norvasc 10 mg Daily. -Continue Lisinopril 2.5 mg daily  Diet: Regular IVF: None VTE: SCDs Prior to Admission Living Arrangement: Home Anticipated Discharge Location: Home Barriers to Discharge: Pt not medically stable.  Dispo: Anticipated discharge in 1-2 day.   Karsten Ro, MD 08/31/2020, 6:31 AM Pager: [phone] After 5pm on weekdays and 1pm on weekends: On Call pager [phone]

## 2020-09-01 NOTE — Progress Notes (Addendum)
Subjective: O/E-Night resident reported that Pt was agitated and hitting staff. He received Ativan 1 mg at 1.47 pm  Pt is seen at rounds today. In restrains. Pt is still drowsy and oriented to self and situation. Not oriented to place or time. Denies having any new symptoms. He denies any diarrhea. He states he is hungry.  Objective:  Vital signs in last 24 hours: Vitals:   08/31/20 1700 08/31/20 1937 09/01/20 0012 09/01/20 0405  BP: (!) 149/87 129/79 133/88 (!) 126/100  Pulse: 99 84 86 96  Resp: 19 19 19 19   Temp: 98.5 F (36.9 C) 98.8 F (37.1 C) 98.6 F (37 C) 98.6 F (37 C)  TempSrc: Oral Oral Oral Oral  SpO2: 100% 100% 99% 99%  Weight:      Height:       CBC Latest Ref Rng & Units 08/30/2020 08/29/2020 08/28/2020  WBC 4.0 - 10.5 K/uL 5.4 9.9 8.2  Hemoglobin 13.0 - 17.0 g/dL 08/30/2020 12.8(L) 12.8(L)  Hematocrit 39.0 - 52.0 % 39.2 37.1(L) 40.0  Platelets 150 - 400 K/uL 105(L) 96(L) 90(L)   CMP Latest Ref Rng & Units 08/31/2020 08/30/2020 08/29/2020  Glucose 70 - 99 mg/dL 98 08/31/2020) 80  BUN 8 - 23 mg/dL 9 5(L) 5(L)  Creatinine 0.61 - 1.24 mg/dL 15(Q 0.08 6.76  Sodium 135 - 145 mmol/L 135 134(L) 134(L)  Potassium 3.5 - 5.1 mmol/L 4.0 3.2(L) 4.4  Chloride 98 - 111 mmol/L 106 102 105  CO2 22 - 32 mmol/L 22 21(L) 20(L)  Calcium 8.9 - 10.3 mg/dL 9.0 1.95) 0.9(T)  Total Protein 6.5 - 8.1 g/dL 6.6 6.6 2.6(Z)  Total Bilirubin 0.3 - 1.2 mg/dL 1.2(W) 5.8(K) 9.9(I)  Alkaline Phos 38 - 126 U/L 75 77 78  AST 15 - 41 U/L 83(H) 86(H) 108(H)  ALT 0 - 44 U/L 60(H) 65(H) 77(H)    Physical Exam Constitutional: Drowsy an confused looking man in restrains. Oriented to self and situation. Not oriented to place, thinks that he is in [place] [facility]. Not oriented to time.  Head- Normocephalic Pulmonary: Normal effort, breathing well at room air Abdominal: mildly distended.  Musculoskeletal: No Edema, Rt leg below knee amputated. Skin: warm, dry Neurological: Drowsy looking, Oriented to  self and situation. Not oriented to place, thinks that he is in [place] [facility] hospital. Not oriented to time. No focal neurological deficits. CN II-XII- Intact. 5/5 strength in Rt and Lt UE's. 5/5 in Left LE. Sensations b/l intact and equal.  Assessment/Plan: Dominic Carter is a 65 y.o. male with hx of HTN, h/o DVT, Dyslipidemia, Alcoholic hepatitis, Alcoholic abuse, tobacco use presented with witnessed seizure at home.  Active Problems:   * No active hospital problems. * Alcohol abuse Alcoholic hepatitis Seizure Essential hypertension Tobacco use disorder  Focal seizures due to prior stroke in the setting of alcohol withdrawal. Focal seizure due to prior stroke likely provoked by alcohol withdrawal. 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 On Keppra 500 mg BID and received high dose of thiamine. No more seizure reported.  -Neurology consulted. Appreciate recs  -Seizure, aspiration and fall precautions.   -Continue Keppra 500 mg BID. -Start Delirium Precautions.  Alcohol abuse and withdrawal  CIWA-5  at 1.42 am, night resident reported that patient was hitting staff. Ativan 1 mg given this morning. Nurse reported that it was repeated after 5 AM this morning.  TOC consulted for Rehab placement however Pt not stable. Will f/u once pt is  medically stable. Pt will have to call rehab center for prescreening.  Patient drowsy, confused today.  Oriented to self and situation only, not oriented to time.  Patient mentation waxing and waning likely due to alcohol withdrawal and Ativan.  -Ativan withdrawal protocol -Continue multivitamin, thiamine, folic acid. -Pt has to call rehab facility for phone screening when stable.   Alcoholic hepatitis (Improved) Liver enzymes improved since admission.  No more monitoring needed.  Essential HTN  H/o essential HTN. Most recent BP-126/100 mmHg. -Continue Home Norvasc 10 mg Daily. -Continue Lisinopril 2.5  mg daily  Diet: Regular IVF: None VTE: SCDs Prior to Admission Living Arrangement: Home Anticipated Discharge Location: Possible rehab placement.  Barriers to Discharge: Not medically stable. Dispo: Anticipated discharge in 1-2 day.   Karsten Ro, MD 09/01/2020, 6:28 AM Pager: [phone] After 5pm on weekdays and 1pm on weekends: On Call pager [phone]

## 2020-09-02 DIAGNOSIS — F10932 Alcohol use, unspecified with withdrawal with perceptual disturbance: Secondary | ICD-10-CM

## 2020-09-02 DIAGNOSIS — F10232 Alcohol dependence with withdrawal with perceptual disturbance: Principal | ICD-10-CM

## 2020-09-02 DIAGNOSIS — F1010 Alcohol abuse, uncomplicated: Secondary | ICD-10-CM | POA: Diagnosis not present

## 2020-09-02 DIAGNOSIS — F10939 Alcohol use, unspecified with withdrawal, unspecified: Secondary | ICD-10-CM

## 2020-09-02 NOTE — TOC Progression Note (Signed)
Transition of Care [facility]) - Progression Note    Patient Details  Name: Dominic Carter MRN: [id number] Date of Birth: [birth year]  Transition of Care [facility]) CM/SW Contact  Carley Hammed, [place] Phone Number: 09/02/2020, 3:57 PM  Clinical Narrative:    CSW received phone call from [place] [phone]) at [facility], they are interested in doing intake for this pt. They will need to speak with him and do an assessment, unable to complete today. Number for covering CSW was given and plan to complete assessment tomorrow. SW will continue to follow for DC needs.   Expected Discharge Plan:  (Substance use residential treatment) Barriers to Discharge: Continued Medical Work up  Expected Discharge Plan and Services Expected Discharge Plan:  (Substance use residential treatment)       Living arrangements for the past 2 months: Single Family Home                                       Social Determinants of Health (SDOH) Interventions    Readmission Risk Interventions No flowsheet data found.

## 2020-09-02 NOTE — Progress Notes (Addendum)
   Subjective: Pt seen at rounds. Orientation better than yesterday. Knows he is at [facility], Thinks that its April-May, Says 1922 then corrected to 2022. He still thinks that he is here because of stroke. Explained to Pt that he is no where for alcohol withdrawal. Pt denies any diarrhea. Pt denies any complains. Talked to him about calling Rehab place for phone interview. He states he may be able to talk to the case worker at Rehab place.  Objective:  Vital signs in last 24 hours: Vitals:   09/01/20 1513 09/01/20 2033 09/01/20 2321 09/02/20 0338  BP: 121/69 104/69 114/72 119/78  Pulse: 74 72 73 70  Resp: 18  18 19   Temp: 98 F (36.7 C) 98.7 F (37.1 C) (!) 97.4 F (36.3 C) 98.2 F (36.8 C)  TempSrc:  Oral Oral Oral  SpO2:  100% 98% 100%  Weight:      Height:        Physical Exam Constitutional: More Alert and oriented  than yesterday but still confused. In restrains.  Head- Normocephalic Pulmonary: Normal effort, breathing well at room air Abdominal: soft, non distended.  Musculoskeletal: No Edema, Rt leg below knee amputated. Skin: warm, dry Neurological: More Alert and oriented  than yesterday but still confused. Knows he is at [facility], Thinks that its April-May, Says 1922 then corrected to 2022. He still thinks that he is here because of stroke. No focal deficits.   Assessment/Plan: Dominic Carter is a 65 y.o. male with hx of HTN, h/o DVT, Dyslipidemia, Alcoholic hepatitis, Alcoholic abuse, tobacco use presented with witnessed seizure at home.  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 Focal seizure due to prior stroke likely provoked by alcohol withdrawal. No more seizure reported.  -Neurology consulted. Appreciate recs   -Seizure, aspiration and fall precautions.   -Continue Keppra 500 mg BID.  Alcohol abuse and withdrawal  CIWA-5 at  0000. (tremors, anxiety, agitation, orientation), last Ativan 5/28 at 147.  TOC consulted for Rehab placement however Pt not stable. Will f/u once pt is medically stable. Pt will have to call rehab center for prescreening.  More alert and oriented than yesterday.  -Ativan withdrawal protocol -Continue multivitamin, thiamine, folic acid. -Pt has to call rehab facility for phone screening. Will ask nursing staff to assist him today to call. -Delirium Precautions.  Alcoholic hepatitis (Improved) Liver enzymes improved since admission.  No more monitoring needed.  Essential HTN (chronic) H/o essential HTN. Most recent BP-119/78 mmHg. -Continue Home Norvasc 10 mg Daily. -Continue Lisinopril 2.5 mg daily  Diet: Regular IVF: None VTE: SCDs Prior to Admission Living Arrangement: Home Anticipated Discharge Location: Possible rehab placement.  Barriers to Discharge: Not medically stable. Dispo: Anticipated discharge in 1-2 day.   6/28, MD 09/02/2020, 5:44 AM Pager: [phone] After 5pm on weekdays and 1pm on weekends: On Call pager [phone]

## 2020-09-03 NOTE — Plan of Care (Signed)
?  Problem: Health Behavior/Discharge Planning: ?Goal: Ability to manage health-related needs will improve ?Outcome: Progressing ?  ?Problem: Clinical Measurements: ?Goal: Ability to maintain clinical measurements within normal limits will improve ?Outcome: Progressing ?Goal: Will remain free from infection ?Outcome: Progressing ?Goal: Diagnostic test results will improve ?Outcome: Progressing ?  ?Problem: Nutrition: ?Goal: Adequate nutrition will be maintained ?Outcome: Progressing ?  ?

## 2020-09-03 NOTE — TOC Progression Note (Signed)
Transition of Care [facility]) - Progression Note    Patient Details  Name: Dominic Carter MRN: [id number] Date of Birth: [birth year]  Transition of [facility]) CM/SW Contact  Kermit Balo, RN Phone Number: 09/03/2020, 10:24 AM  Clinical Narrative:    CM left message for Kristine Garbe at [facility] treatment center. She was no available currently and CM will get a call back when she is able to do a phone interview with the patient.   Expected Discharge Plan:  (Substance use residential treatment) Barriers to Discharge: Continued Medical Work up  Expected Discharge Plan and Services Expected Discharge Plan:  (Substance use residential treatment)       Living arrangements for the past 2 months: Single Family Home                                       Social Determinants of Health (SDOH) Interventions    Readmission Risk Interventions No flowsheet data found.

## 2020-09-03 NOTE — Progress Notes (Addendum)
   Subjective:  Pt is seen at rounds today. Denies any anxiety, diarrhea. He states he called the rehab place and don't know if he is able to go there today or not as it is holiday. Explained that the assessment was not completed yesterday and he would have to call again. He agrees with the plan. Denies any complains or concerns.   Objective:  Vital signs in last 24 hours: Vitals:   09/02/20 1700 09/02/20 1951 09/03/20 0000 09/03/20 0329  BP: 113/84 108/73 105/76 119/73  Pulse: 60 75 63 83  Resp: 18 20 19 16   Temp: 98 F (36.7 C) 98.3 F (36.8 C) 98.5 F (36.9 C) 98.1 F (36.7 C)  TempSrc: Oral Oral Oral Oral  SpO2: 100% 98% 100% 97%  Weight:      Height:       Physical Exam Constitutional: Pt is sitting in bed. Alter and oriented x3.  Head- Normocephalic, atraumatic, no icterus.  Pulmonary: Normal effort, breathing well at room air Abdominal: soft, non distended, non tender. BS +nt Musculoskeletal: No Edema, Rt leg below knee amputated. Skin: warm, dry Neurological: Awake, alert and oriented x3. Knows he is in [place] in a hospital, Knows its May, 2022.   Assessment/Plan: Dominic Carter is a 65 y.o. male with hx of HTN, h/o DVT, Dyslipidemia, Alcoholic hepatitis, Alcoholic abuse, tobacco use presented with witnessed seizure at home. 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 Focal seizure due to prior stroke likely provoked by alcohol withdrawal. No more seizure reported.  -Neurology consulted. Appreciate recs  -Seizure, aspiration and fall precautions.   -Continue Keppra 500 mg BID.  Alcohol Withdrawal - resolved Alcohol Use Disorder CIWA-4 yesterday at 1700. No ativan given .  Last Ativan on 5/28 - As per CSW note he couldn't complete phone screening with rehab yesterday. Will try again today. -Ativan withdrawal protocol -Continue multivitamin, thiamine, folic acid. -Delirium Precautions. -Can be discharge if accepted to Rehab today.  Essential HTN (chronic) -Continue Home Norvasc 10 mg Daily. -Continue Lisinopril 2.5 mg daily   Diet: Regular IVF: None VTE: SCDs Prior to Admission Living Arrangement: Home Anticipated Discharge Location: Possible rehab placement.  Barriers to Discharge: Medically stable for discharge waiting for  Rehab placement Dispo: Anticipated discharge in 0-1 days.   6/28, MD 09/03/2020, 6:01 AM Pager: [phone] After 5pm on weekdays and 1pm on weekends: On Call pager [phone]

## 2020-09-03 NOTE — Progress Notes (Signed)
No sitter available to sit with patient. Patient high fall risk and sitting up in bed, redirectable but unsafe. MD notified, no medication to be given at this time. Try tele sitter. Continue to monitor.

## 2020-09-04 DIAGNOSIS — R569 Unspecified convulsions: Secondary | ICD-10-CM | POA: Diagnosis not present

## 2020-09-04 MED ORDER — HYDROCORTISONE 1 % EX CREA
1.0000 "application " | TOPICAL_CREAM | Freq: Two times a day (BID) | CUTANEOUS | Status: DC
  Administered 2020-09-04 – 2020-09-06 (×4): 1 via TOPICAL
  Filled 2020-09-04: qty 28

## 2020-09-04 NOTE — Evaluation (Signed)
Physical Therapy Evaluation Patient Details Name: Dominic Carter MRN: [id number] DOB: Jan 30, 1956 Today's Date: 09/04/2020   History of Present Illness  65 y.o. male admitted on 08/28/20 for seizure.  Pt dx with focal seizures due to prior stroke in the setting of alcohol withdrawal.  Pt with significant PMH of HTN, R BKA, DVT.  Clinical Impression  Pt is likely very close to his baseline level of functioning.  Pt was able to hop across the room with RW, however, he would be safer and able to achieve mod I level of care for both ADLs and mobility better from a WC level.  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 His niece is supposed to bring his WC and walker up to the hospital and I asked him if she could bring his prosthetic leg so that we might begin the process of getting it repaired if we can prior to d/c (if possible, if not re-connect him with the prosthetic company that made it).   PT to follow acutely for deficits listed below.    Follow Up Recommendations Other (comment) (pt planning to go to alcohol rehab center)    Equipment Recommendations  None recommended by PT    Recommendations for Other Services       Precautions / Restrictions        Mobility  Bed Mobility               General bed mobility comments: Pt was OOB in the recliner chair.    Transfers Overall transfer level: Needs assistance Equipment used: Rolling walker (2 wheeled) Transfers: Sit to/from Stand Sit to Stand: Modified independent (Device/Increase time)         General transfer comment: Pt able to stand and stabilize in standing both with RW and by proping his residual limb in the recliner chair.  Ambulation/Gait Ambulation/Gait assistance: Supervision;Min guard Gait Distance (Feet): 25 Feet Assistive device: Rolling walker (2 wheeled) Gait Pattern/deviations: Step-to pattern;Step-through pattern     General Gait  Details: Pt started with hop to and then progressed with hop through gait pattern with RW.  Cues to slow speed.  Started as min guard and progressed to close supervision for gait.  He would be mod I at [facility] level.  Stairs            Wheelchair Mobility    Modified Rankin (Stroke Patients Only)       Balance Overall balance assessment: Needs assistance Sitting-balance support: Feet supported;No upper extremity supported Sitting balance-Leahy Scale: Good     Standing balance support: Bilateral upper extremity supported;Single extremity supported Standing balance-Leahy Scale: Good Standing balance comment: Pt able to stand as long as he is stabilized against a support surface.  At the sink washing his hands he was able to remove both hands as long as his trunk was leaning against the sink for balance, then he would wash hands and return hands to walker when ready to walk away.                             Pertinent Vitals/Pain Pain Assessment: No/denies pain    Home Living Family/patient expects to be discharged to:: Private residence Living Arrangements: Other relatives (niece) Available Help at Discharge: Available PRN/intermittently (niece works) Type of Home: House Home Access: Stairs to enter Entrance Stairs-Rails: Right Entrance Stairs-Number of Steps:  4 in front, 3 in back Home Layout: One level Home Equipment: Walker - 2 wheels;Cane - single point;Wheelchair - manual Additional Comments: Per pt his R leg prosthesis is broken and he never got it fixed. Parks his WC outside    Prior Function Level of [place]: Independent with assistive device(s)         Comments: Uses RW for ambulation from bed to bathroom and kitchen; does not wear prosthesis too much. Does ADLs, does some cooking. Does not drive. no falls last 6 months.     Hand Dominance   Dominant Hand: Right    Extremity/Trunk Assessment   Upper Extremity Assessment Upper Extremity  Assessment: Overall WFL for tasks assessed    Lower Extremity Assessment RLE Deficits / Details: right leg with BKA, good ROM and strength, rests it on things for balance in standing (ie rested it on the toilet seat while urinating in standing).    Cervical / Trunk Assessment Cervical / Trunk Assessment: Normal  Communication   Communication: No difficulties  Cognition Arousal/Alertness: Awake/alert Behavior During Therapy: Impulsive (moves quickly) Overall Cognitive Status: No family/caregiver present to determine baseline cognitive functioning                                        General Comments      Exercises     Assessment/Plan    PT Assessment Patient needs continued PT services  PT Problem List Decreased balance;Decreased mobility;Decreased knowledge of use of DME;Decreased knowledge of precautions       PT Treatment Interventions DME instruction;Gait training;Therapeutic exercise;Therapeutic activities;Stair training;Functional mobility training;Balance training;Cognitive remediation;Patient/family education;Wheelchair mobility training    PT Goals (Current goals can be found in the Care Plan section)  Acute Rehab PT Goals Patient Stated Goal: to get better, go to rehab PT Goal Formulation: With patient Time For Goal Achievement: 09/18/20 Potential to Achieve Goals: Good    Frequency Min 3X/week   Barriers to discharge        Co-evaluation               AM-PAC PT "6 Clicks" Mobility  Outcome Measure Help needed turning from your back to your side while in a flat bed without using bedrails?: None Help needed moving from lying on your back to sitting on the side of a flat bed without using bedrails?: None Help needed moving to and from a bed to a chair (including a wheelchair)?: None Help needed standing up from a chair using your arms (e.g., wheelchair or bedside chair)?: None Help needed to walk in hospital room?: A Little Help  needed climbing 3-5 steps with a railing? : A Little 6 Click Score: 22    End of Session Equipment Utilized During Treatment: Gait belt Activity Tolerance: Patient tolerated treatment well Patient left: in chair;with call bell/phone within reach;with nursing/sitter in room   PT Visit Diagnosis: Difficulty in walking, not elsewhere classified (R26.2);Other abnormalities of gait and mobility (R26.89)    Time: 3716-9678 PT Time Calculation (min) (ACUTE ONLY): 14 min   Charges:   PT Evaluation $PT Eval Moderate Complexity: 1 Mod          Corinna Capra, PT, DPT  Acute Rehabilitation Ortho Tech Supervisor [phone] pager [phone]) [phone] office

## 2020-09-04 NOTE — Care Management Important Message (Signed)
Important Message  Patient Details  Name: Dominic Carter MRN: [id number] Date of Birth: May 01, 1955   Medicare Important Message Given:  Yes     Dorena Bodo 09/04/2020, 3:45 PM

## 2020-09-04 NOTE — Plan of Care (Signed)
  Problem: Health Behavior/Discharge Planning: Goal: Ability to manage health-related needs will improve Outcome: Progressing   Problem: Clinical Measurements: Goal: Ability to maintain clinical measurements within normal limits will improve Outcome: Progressing Goal: Will remain free from infection Outcome: Progressing Goal: Diagnostic test results will improve Outcome: Progressing   Problem: Nutrition: Goal: Adequate nutrition will be maintained Outcome: Progressing   Problem: Coping: Goal: Level of anxiety will decrease Outcome: Progressing   Problem: Elimination: Goal: Will not experience complications related to bowel motility Outcome: Progressing Goal: Will not experience complications related to urinary retention Outcome: Progressing   Problem: Pain Managment: Goal: General experience of comfort will improve Outcome: Progressing   Problem: Safety: Goal: Ability to remain free from injury will improve Outcome: Progressing   Problem: Skin Integrity: Goal: Risk for impaired skin integrity will decrease Outcome: Progressing   Problem: Coping: Goal: Ability to adjust to condition or change in health will improve Outcome: Progressing   Problem: Health Behavior/Discharge Planning: Goal: Compliance with prescribed medication regimen will improve Outcome: Progressing   Problem: Medication: Goal: Risk for medication side effects will decrease Outcome: Progressing   Problem: Clinical Measurements: Goal: Complications related to the disease process, condition or treatment will be avoided or minimized Outcome: Progressing Goal: Diagnostic test results will improve Outcome: Progressing   Problem: Safety: Goal: Verbalization of understanding the information provided will improve Outcome: Progressing   Problem: Self-Concept: Goal: Ability to verbalize feelings about condition will improve Outcome: Progressing   Problem: Safety: Goal: Non-violent  Restraint(s) Outcome: Progressing   Problem: Safety: Goal: Violent Restraint(s) Outcome: Progressing

## 2020-09-04 NOTE — Progress Notes (Signed)
Patient can safely stand and pivot from bed to bedside commode.

## 2020-09-04 NOTE — Progress Notes (Signed)
   Subjective:  Pt seen at rounds. Pt states he is doing good and likes the help of 1:1 sitter. Pt states he had a haircut and shave recently and feels like a new person. Pt denies any complaints. Explained that we are just waiting from the [facility] Rehab [facility]. Pt agrees with plan. Objective:  Vital signs in last 24 hours: Vitals:   08/28/20 0932 09/04/20 0416 09/04/20 0751 09/04/20 1200  BP:  122/75 114/65 108/70  Pulse:  88 68 86  Resp:  18 20 20   Temp:  98.3 F (36.8 C) 98.1 F (36.7 C) 97.9 F (36.6 C)  TempSrc:  Oral Oral Oral  SpO2:  99% 100% 100%  Weight: 68.7 kg     Height: 5\' 11"  (1.803 m)      Physical Exam Constitutional: Pt sitting in chair comfortably. Not in acute distress. Head- Normocephalic Pulmonary: Normal effort. Breathing comfortably at room air.  Musculoskeletal: Rt leg below knee amputated.  Skin: Warm, dry, Multiple eczematous lesions present on legs.  Neurological: Awake and alert. Speech- normal, no dysarthria.   Assessment/Plan: Dominic Carter is a 65 y.o. male with hx of HTN, h/o DVT, Dyslipidemia, Alcoholic hepatitis, Alcoholic abuse, tobacco use presented with witnessed seizure at home. Medically stable for discharge waiting for Rehab placement.   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 Focal seizure due to prior stroke likely provoked by alcohol withdrawal. No more seizure reported.  -Neurology consulted. Appreciate recs  -Seizure, aspiration and fall precautions.   -Continue Keppra 500 mg BID.  Alcohol Withdrawal - resolved Alcohol Use Disorder Last Ativan on 5/28.  -Continue multivitamin, thiamine, folic acid.  Essential HTN (chronic) -Continue Home Norvasc 10 mg Daily. -Continue Lisinopril 2.5 mg  daily  TOC CSW and case manager contacted 76. They want PT evaluation prior to accepting Pt to know if Pt is independent with ADL's and transfers. Once that is done they will review it tomorrow and will update CM. They wont have transport for the patient until Thursday.  -PT Eval and Treat. -Case manager left message with the Niece to bring his wheelchair and walker.   Diet: Regular IVF: None VTE: SCDs Prior to Admission Living Arrangement: Home Anticipated Discharge Location: Possible Rehab placement.  Barriers to Discharge: Medically stable for discharge waiting for Rehab placement Dispo: Anticipated discharge in 1-2 days.   [employer], MD 09/04/2020, 3:02 PM Pager: [phone] After 5pm on weekdays and 1pm on weekends: On Call pager [phone]

## 2020-09-04 NOTE — TOC Progression Note (Signed)
Transition of Care [facility]) - Progression Note    Patient Details  Name: Dominic Carter MRN: [id number] Date of Birth: 12-Apr-1955  [facility]) CM/SW Contact  Kermit Balo, RN Phone Number: 09/04/2020, 2:53 PM  Clinical Narrative:    CM has provided pt phone for interview with [facility] treatment center. They requested clinicals which were faxed: [phone]. They also need a PT eval that pt is independent with ADLs and transfers. CM has updated MD.  Vivia Budge will review information and update CM tomorrow. They wont have transport for the patient until Thursday.  CM has left voicemail for pts niece to try and get walker and wheelchair from home for [employer].  TOC following.   Expected Discharge Plan:  (Substance use residential treatment) Barriers to Discharge: Continued Medical Work up  Expected Discharge Plan and Services Expected Discharge Plan:  (Substance use residential treatment)       Living arrangements for the past 2 months: Single Family Home                                       Social Determinants of Health (SDOH) Interventions    Readmission Risk Interventions No flowsheet data found.

## 2020-09-05 ENCOUNTER — Other Ambulatory Visit (HOSPITAL_COMMUNITY): Payer: Self-pay

## 2020-09-05 DIAGNOSIS — R569 Unspecified convulsions: Secondary | ICD-10-CM | POA: Diagnosis not present

## 2020-09-05 DIAGNOSIS — F10232 Alcohol dependence with withdrawal with perceptual disturbance: Secondary | ICD-10-CM | POA: Diagnosis not present

## 2020-09-05 DIAGNOSIS — I10 Essential (primary) hypertension: Secondary | ICD-10-CM | POA: Diagnosis not present

## 2020-09-05 DIAGNOSIS — F1010 Alcohol abuse, uncomplicated: Secondary | ICD-10-CM | POA: Diagnosis not present

## 2020-09-05 MED ORDER — ADULT MULTIVITAMIN W/MINERALS CH: 1.0000 | ORAL_TABLET | Freq: Every day | ORAL | Status: DC

## 2020-09-05 MED ORDER — HYDROCORTISONE 1 % EX CREA
1.0000 "application " | TOPICAL_CREAM | Freq: Two times a day (BID) | CUTANEOUS | 0 refills | Status: DC
  Filled 2020-09-05: qty 30, 10d supply, fill #0

## 2020-09-05 MED ORDER — AMLODIPINE BESYLATE 10 MG PO TABS
10.0000 mg | ORAL_TABLET | Freq: Every day | ORAL | 0 refills | Status: DC
  Filled 2020-09-05: qty 30, 30d supply, fill #0

## 2020-09-05 MED ORDER — LISINOPRIL 2.5 MG PO TABS
2.5000 mg | ORAL_TABLET | Freq: Every day | ORAL | 0 refills | Status: DC
  Filled 2020-09-05: qty 30, 30d supply, fill #0

## 2020-09-05 MED ORDER — THIAMINE HCL 100 MG PO TABS
100.0000 mg | ORAL_TABLET | Freq: Every day | ORAL | 0 refills | Status: DC
  Filled 2020-09-05: qty 30, 30d supply, fill #0

## 2020-09-05 MED ORDER — FOLIC ACID 1 MG PO TABS
1.0000 mg | ORAL_TABLET | Freq: Every day | ORAL | 0 refills | Status: DC
  Filled 2020-09-05: qty 30, 30d supply, fill #0

## 2020-09-05 MED ORDER — LEVETIRACETAM 500 MG PO TABS
500.0000 mg | ORAL_TABLET | Freq: Two times a day (BID) | ORAL | 0 refills | Status: DC
  Filled 2020-09-05: qty 60, 30d supply, fill #0

## 2020-09-05 NOTE — Progress Notes (Signed)
   Subjective:  Pt seen at rounds. States he is doing good.  Denies any headache, nausea, vomiting, abdominal pain, diarrhea.  Denies any complaints.  Discussed that we are waiting for the rehab placement and we have asked his niece to bring his walker and wheelchair to the hospital before he goes to the substance abuse treatment center.  Objective:  Vital signs in last 24 hours: Vitals:   09/04/20 1657 09/04/20 1919 09/04/20 2316 09/05/20 0356  BP: 101/66 137/72 112/63 114/61  Pulse: 69 76 72 66  Resp: 18 18 16 17   Temp: 97.8 F (36.6 C) 97.9 F (36.6 C) 97.8 F (36.6 C) 97.9 F (36.6 C)  TempSrc: Oral Oral Oral Oral  SpO2: 99% 100% 98% 100%  Weight:      Height:       Physical Exam Constitutional: Pt lying in bed comfortably. Not in acute distress. Head-normocephalic. Pulmonary: Normal effort, breathing comfortably on room air.  Musculoskeletal: Rt leg below knee amputated.  Skin: Warm, dry Neurological: Awake and alert. Speech- normal, no dysarthria.   Assessment/Plan: Dominic Carter is a 65 y.o. male with hx of HTN, h/o DVT, Dyslipidemia, Alcoholic hepatitis, Alcoholic abuse, tobacco use presented with witnessed seizure at home. Medically stable for discharge waiting for Transportation to [facility] Can be transportable Tomorrow.   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 Focal seizure due to prior stroke likely provoked by alcohol withdrawal. No more seizure reported.  -Neurology consulted. Appreciate recs  -Seizure, aspiration and fall precautions.   -Continue Keppra 500 mg BID.  Alcohol Withdrawal (resolved) Alcohol Use Disorder Last Ativan on 5/28.  -Continue multivitamin, thiamine, folic acid. -Will be going for inpatient Alcohol rehab  place at [facility] tomorrow.   Essential HTN (chronic) -Continue Home Norvasc 10 mg Daily. -Continue Lisinopril 2.5 mg daily  Transition of Care Pt has been accepted to [facility] however they don't have transportation available today so he will be able to go Tomorrow. PT evaluation done. Niece is going to bring his wheelchair and walker before he goes to [facility] tomorrow.   Diet: Regular IVF: None VTE: SCDs Prior to Admission Living Arrangement: Home Anticipated Discharge Location: [facility] Barriers to Discharge: Medically stable for discharge waiting for Transportation to [facility]. Can be transportable Tomorrow.  Dispo: Anticipated discharge tomorrow.   [facility], MD 09/05/2020, 6:24 AM Pager: [phone] After 5pm on weekdays and 1pm on weekends: On Call pager [phone]

## 2020-09-05 NOTE — TOC Progression Note (Signed)
Transition of Care [facility]) - Progression Note    Patient Details  Name: Dominic Carter MRN: [id number] Date of Birth: 03-19-1956  Transition of Care [facility]) CM/SW Contact  Kermit Balo, RN Phone Number: 09/05/2020, 10:23 AM  Clinical Narrative:    Plan is for pt to d/c to [facility] tomorrow. Awaiting information on transport.  Pts niece to bring needed DME to the hospital today.  Pt will need to take 30 days worth of meds to the treatment facility. CM has updated the MD. TOC following.   Expected Discharge Plan:  (Substance use residential treatment) Barriers to Discharge: Continued Medical Work up  Expected Discharge Plan and Services Expected Discharge Plan:  (Substance use residential treatment)       Living arrangements for the past 2 months: Single Family Home                                       Social Determinants of Health (SDOH) Interventions    Readmission Risk Interventions No flowsheet data found.

## 2020-09-06 ENCOUNTER — Other Ambulatory Visit (HOSPITAL_COMMUNITY): Payer: Self-pay

## 2020-09-06 LAB — RESP PANEL BY RT-PCR (FLU A&B, COVID) ARPGX2
Influenza A by PCR: NEGATIVE
Influenza B by PCR: NEGATIVE
SARS Coronavirus 2 by RT PCR: NEGATIVE

## 2020-09-06 MED ORDER — PRAVASTATIN SODIUM 20 MG PO TABS
20.0000 mg | ORAL_TABLET | Freq: Every day | ORAL | 0 refills | Status: DC
  Filled 2020-09-06: qty 30, 30d supply, fill #0

## 2020-09-06 NOTE — TOC Transition Note (Signed)
Transition of Care [facility]) - CM/SW Discharge Note   Patient Details  Name: Dominic Carter MRN: [id number] Date of Birth: [birth year]  Transition of Care [facility]) CM/SW Contact:  Kermit Balo, RN Phone Number: 09/06/2020, 12:45 PM   Clinical Narrative:    Patient is discharging to alcohol treatment center in [place]. They have accepted him and will provide transport to the facility. Pt has 30 days of medications with him for the treatment center.  Pt personal wheelchair and walker are at the bedside.  3 in 1 to be delivered to the room per [employer].  Pts niece is aware of the plan.    Final next level of care: Other (comment) Economist) Barriers to Discharge: No Barriers Identified   Patient Goals and CMS Choice Patient states their goals for this hospitalization and ongoing recovery are:: "if they got a bed I will go"      Discharge Placement                       Discharge Plan and Services                                     Social Determinants of Health (SDOH) Interventions     Readmission Risk Interventions No flowsheet data found.

## 2020-09-06 NOTE — Discharge Instructions (Signed)
Dear Dominic Carter,   Thank you for letting us participate in your care! In this section, you will find a brief hospital admission summary of why you were admitted to the hospital, what happened during your admission, your diagnosis/diagnoses, and recommended follow up.   You were admitted because you were experiencing Seizure and Alcohol withdrawl.   Your testing revealed past stroke and Alcohol withdrawal    You were diagnosed with Seizure due to past stroke exacerbated by alcohol withdrawal.  You were treated with Antiepileptics and Benzodiazepines.   You were also seen by Neurology. They recommended antiepileptics.   Your symptoms improved and you were discharged from the hospital for meeting this goal.   Please follow Seizure precautions as below. No driving, swimming, climbing on a ladder for 6 months.    POST-HOSPITAL & CARE INSTRUCTIONS 1. Follow up with IM Clinic after rehab follow up. Please call for an appointment.  2. Please let PCP/Specialists know of any changes in medications that were made.  3. Please see medications section of this packet for any medication changes.   DOCTOR'S APPOINTMENTS & FOLLOW UP No future appointments.   Thank you for choosing [facility]! Take care and be well!  Internal Medicine Teaching Service Inpatient Team [place]  [facility]  [address] [place], [place] [address] [phone]   Seizure, Adult A seizure is a sudden burst of abnormal electrical and chemical activity in the brain. Seizures usually last from 30 seconds to 2 minutes. The abnormal activity temporarily interrupts normal brain function. Many types of seizures can affect adults. A seizure can cause many different symptoms depending on where in the brain it starts. What are the causes? Common causes of this condition include:  Fever or infection.  Brain injury, head trauma, bleeding in the brain, or a brain tumor.  Low levels of  blood sugar or salt (sodium).  Kidney problems or liver problems.  Metabolic disorders or other conditions that are passed from parent to child (are inherited).  Reaction to a substance, such as a drug or a medicine, or suddenly stopping the use of a substance (withdrawal).  A stroke.  Developmental disorders such as autism spectrum disorder or cerebral palsy. In some cases, the cause of a seizure may not be known. Some people who have a seizure never have another one. A person who has repeated seizures over time without a clear cause has a condition called epilepsy. What increases the risk? You are more likely to develop this condition if:  You have a family history of epilepsy.  You have had a tonic-clonic seizure before. This type of seizure causes tightening (contraction) of the muscles of the whole body and loss of consciousness.  You have a history of head trauma, lack of oxygen at birth, or strokes. What are the signs or symptoms? There are many different types of seizures. The symptoms vary depending on the type of seizure you have. Symptoms occur during the seizure. They may also occur before a seizure (aura) and after a seizure (postictal). Symptoms may include the following: Symptoms during a seizure  Uncontrollable shaking (convulsions) with fast, jerky movements of muscles.  Stiffening of the body.  Breathing problems.  Confusion, staring, or unresponsiveness.  Head nodding, eye blinking or fluttering, or rapid eye movements.  Drooling, grunting, or making clicking sounds with your mouth.  Loss of bladder control and bowel control. Symptoms before a seizure  Fear or anxiety.  Nausea.  Vertigo. This is a feeling like: ? You are moving when you are not. ? Your surroundings are moving when they are not.  Dj vu. This is a feeling of having seen or heard something before.  Odd tastes or smells.  Changes in vision, such as seeing flashing lights or  spots. Symptoms after a seizure  Confusion.  Sleepiness.  Headache.  Sore muscles. How is this diagnosed? This condition may be diagnosed based on:  A description of your symptoms. Video of your seizures can be helpful.  Your medical history.  A physical exam. You may also have tests, including:  Blood tests.  CT scan.  MRI.  Electroencephalogram (EEG). This test measures electrical activity in the brain. An EEG can predict whether seizures will return.  A spinal tap, also called a lumbar puncture. This is the removal and testing of fluid that surrounds the brain and spinal cord. How is this treated? Most seizures will stop on their own in less than 5 minutes, and no treatment is needed. Seizures that last longer than 5 minutes will usually need treatment. Seizures may be treated with:  Medicines given through an IV.  Avoiding known triggers, such as medicines that you take for another condition.  Medicines to control seizures or prevent future seizures (antiepileptics), if epilepsy caused your seizures.  Medical devices to prevent and control seizures.  Surgery to stop seizures or to reduce how often seizures happen, if you have epilepsy that does not respond to medicines.  A diet low in carbohydrates and high in fat (ketogenic diet). Follow these instructions at home: Medicines  Take over-the-counter and prescription medicines only as told by your health care provider.  Avoid any substances that may prevent your medicine from working properly, such as alcohol. Activity  Follow instructions about activities, such as driving or swimming, that would be dangerous if you had another seizure. Wait until your health care provider says it is safe to do them.  If you live in the [place] check with your local department of motor vehicles [facility]) to find out about local driving laws. Each state has specific rules about when you can legally drive again.  Get enough rest.  Lack of sleep can make seizures more likely to occur. Educating others  Teach friends and family what to do if you have a seizure. They should: ? Help you get down to the ground, to prevent a fall. ? Cushion your head and move items away from your body. ? Loosen any tight clothing around your neck. ? Turn you on your side. If you vomit, this helps keep your airway clear. ? Know whether or not you need emergency care. ? Stay with you until you recover.  Also, tell them what not to do if you have a seizure. Tell them: ? They should not hold you down. Holding you down will not stop the seizure. ? They should not put anything in your mouth.   General instructions  Avoid anything that has ever triggered a seizure for you.  Keep a seizure diary. Record what you remember about each seizure, especially anything that might have triggered it.  Keep all follow-up visits. This is important. Contact a health care provider if:  You have another seizure or seizures. Call each time you have a seizure.  Your seizure pattern changes.  You continue to have seizures with treatment.  You have symptoms of an infection or illness. Either of these might increase your risk of having a seizure.  You are unable to take your medicine. Get help right away if:  You have: ? A seizure that does not stop after 5 minutes. ? Several seizures in a row without a complete recovery between seizures. ? A seizure that makes it harder to breathe. ? A seizure that leaves you unable to speak or use a part of your body.  You do not wake up right away after a seizure.  You injure yourself during a seizure.  You have confusion or pain right after a seizure. These symptoms may represent a serious problem that is an emergency. Do not wait to see if the symptoms will go away. Get medical help right away. Call your local emergency services (911 in the [place] Do not drive yourself to the hospital. Summary  Seizures are  caused by abnormal electrical and chemical activity in the brain. The activity disrupts normal brain function and can cause various symptoms.  Seizures have many causes, including illness, head injuries, low levels of blood sugar or salt, and certain conditions.  Most seizures will stop on their own in less than 5 minutes. Seizures that last longer than 5 minutes are a medical emergency and need treatment right away.  Many medicines are used to treat seizures. Take over-the-counter and prescription medicines only as told by your health care provider. This information is not intended to replace advice given to you by your health care provider. Make sure you discuss any questions you have with your health care provider. Document Revised: 09/30/2019 Document Reviewed: 09/30/2019 Elsevier Patient Education  2021 [employer].

## 2020-11-11 ENCOUNTER — Emergency Department (HOSPITAL_COMMUNITY): Payer: Medicare Other

## 2020-11-11 ENCOUNTER — Other Ambulatory Visit: Payer: Self-pay

## 2020-11-11 ENCOUNTER — Emergency Department (HOSPITAL_COMMUNITY)
Admission: EM | Admit: 2020-11-11 | Discharge: 2020-11-11 | Disposition: A | Discharge status: Home / Self Care | Payer: Medicare Other | Attending: Emergency Medicine | Admitting: Emergency Medicine

## 2020-11-11 ENCOUNTER — Encounter (HOSPITAL_COMMUNITY): Payer: Self-pay

## 2020-11-11 DIAGNOSIS — Z20822 Contact with and (suspected) exposure to covid-19: Secondary | ICD-10-CM | POA: Diagnosis not present

## 2020-11-11 DIAGNOSIS — F17210 Nicotine dependence, cigarettes, uncomplicated: Secondary | ICD-10-CM | POA: Insufficient documentation

## 2020-11-11 DIAGNOSIS — F10220 Alcohol dependence with intoxication, uncomplicated: Secondary | ICD-10-CM | POA: Insufficient documentation

## 2020-11-11 DIAGNOSIS — I10 Essential (primary) hypertension: Secondary | ICD-10-CM | POA: Insufficient documentation

## 2020-11-11 DIAGNOSIS — Z7982 Long term (current) use of aspirin: Secondary | ICD-10-CM | POA: Insufficient documentation

## 2020-11-11 DIAGNOSIS — Z79899 Other long term (current) drug therapy: Secondary | ICD-10-CM | POA: Diagnosis not present

## 2020-11-11 DIAGNOSIS — Y901 Blood alcohol level of 20-39 mg/100 ml: Secondary | ICD-10-CM | POA: Diagnosis not present

## 2020-11-11 DIAGNOSIS — F1010 Alcohol abuse, uncomplicated: Secondary | ICD-10-CM

## 2020-11-11 DIAGNOSIS — R519 Headache, unspecified: Secondary | ICD-10-CM | POA: Insufficient documentation

## 2020-11-11 LAB — URINALYSIS, ROUTINE W REFLEX MICROSCOPIC
Bacteria, UA: NONE SEEN
Bilirubin Urine: NEGATIVE
Glucose, UA: NEGATIVE mg/dL
Ketones, ur: NEGATIVE mg/dL
Leukocytes,Ua: NEGATIVE
Nitrite: NEGATIVE
Protein, ur: 30 mg/dL — AB
Specific Gravity, Urine: 1.014 (ref 1.005–1.030)
pH: 5 (ref 5.0–8.0)

## 2020-11-11 LAB — CBC WITH DIFFERENTIAL/PLATELET
Abs Immature Granulocytes: 0.04 10*3/uL (ref 0.00–0.07)
Basophils Absolute: 0.1 10*3/uL (ref 0.0–0.1)
Basophils Relative: 1 %
Eosinophils Absolute: 0 10*3/uL (ref 0.0–0.5)
Eosinophils Relative: 0 %
HCT: 40.1 % (ref 39.0–52.0)
Hemoglobin: 13.3 g/dL (ref 13.0–17.0)
Immature Granulocytes: 0 %
Lymphocytes Relative: 6 %
Lymphs Abs: 0.6 10*3/uL — ABNORMAL LOW (ref 0.7–4.0)
MCH: 30 pg (ref 26.0–34.0)
MCHC: 33.2 g/dL (ref 30.0–36.0)
MCV: 90.5 fL (ref 80.0–100.0)
Monocytes Absolute: 0.9 10*3/uL (ref 0.1–1.0)
Monocytes Relative: 9 %
Neutro Abs: 7.9 10*3/uL — ABNORMAL HIGH (ref 1.7–7.7)
Neutrophils Relative %: 84 %
Platelets: 165 10*3/uL (ref 150–400)
RBC: 4.43 MIL/uL (ref 4.22–5.81)
RDW: 13.4 % (ref 11.5–15.5)
WBC: 9.4 10*3/uL (ref 4.0–10.5)
nRBC: 0 % (ref 0.0–0.2)

## 2020-11-11 LAB — COMPREHENSIVE METABOLIC PANEL
ALT: 16 U/L (ref 0–44)
AST: 35 U/L (ref 15–41)
Albumin: 4 g/dL (ref 3.5–5.0)
Alkaline Phosphatase: 70 U/L (ref 38–126)
Anion gap: 15 (ref 5–15)
BUN: 10 mg/dL (ref 8–23)
CO2: 18 mmol/L — ABNORMAL LOW (ref 22–32)
Calcium: 8.7 mg/dL — ABNORMAL LOW (ref 8.9–10.3)
Chloride: 103 mmol/L (ref 98–111)
Creatinine, Ser: 1.05 mg/dL (ref 0.61–1.24)
GFR, Estimated: >60 mL/min (ref >60)
Glucose, Bld: 86 mg/dL (ref 70–99)
Potassium: 4.1 mmol/L (ref 3.5–5.1)
Sodium: 136 mmol/L (ref 135–145)
Total Bilirubin: 0.9 mg/dL (ref 0.3–1.2)
Total Protein: 7.6 g/dL (ref 6.5–8.1)

## 2020-11-11 LAB — AMMONIA: Ammonia: 36 umol/L — ABNORMAL HIGH (ref 9–35)

## 2020-11-11 LAB — ETHANOL: Alcohol, Ethyl (B): 30 mg/dL — ABNORMAL HIGH (ref <10)

## 2020-11-11 LAB — RESP PANEL BY RT-PCR (FLU A&B, COVID) ARPGX2
Influenza A by PCR: NEGATIVE
Influenza B by PCR: NEGATIVE
SARS Coronavirus 2 by RT PCR: NEGATIVE

## 2020-11-11 LAB — MAGNESIUM: Magnesium: 2.4 mg/dL (ref 1.7–2.4)

## 2020-11-11 LAB — LIPASE, BLOOD: Lipase: 38 U/L (ref 11–51)

## 2020-11-11 IMAGING — CT CT HEAD W/O CM
3 series · 14 of 47 positions shown, 16 images · non-contrast
Comparison: [DATE]

CLINICAL DATA: Head injury, intoxication, Unwitnessed fall

EXAM:
CT HEAD WITHOUT CONTRAST
TECHNIQUE: Contiguous axial images were obtained from the base of the skull
through the vertex without intravenous contrast.

[Series 2: head wo · axial · 0.47mm/px · z∈[+1214,+1358]mm · 8 of 35 slices shown, 10 images]
[im 3/35  brain]
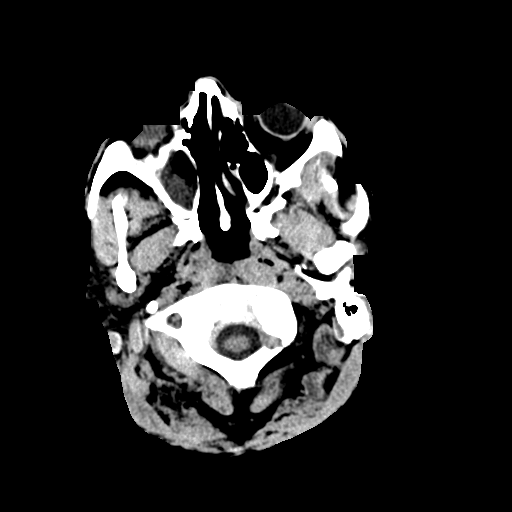
[im 3/35  bone]
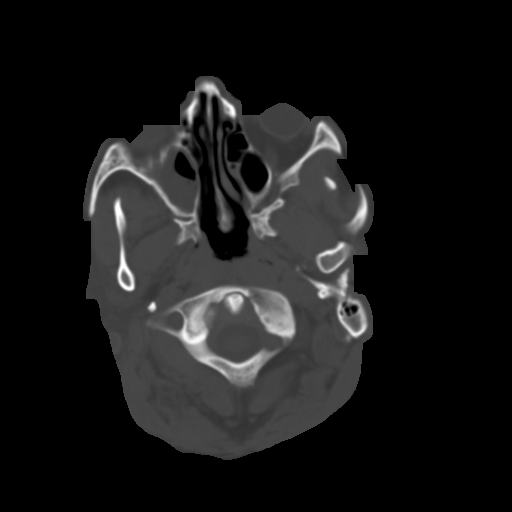
[im 8/35  brain]
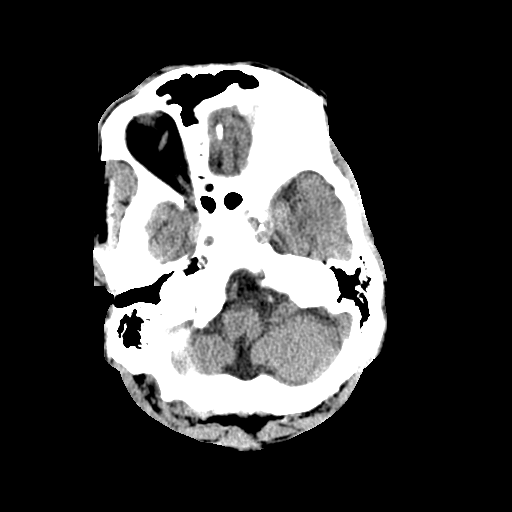
[im 11/35  brain]
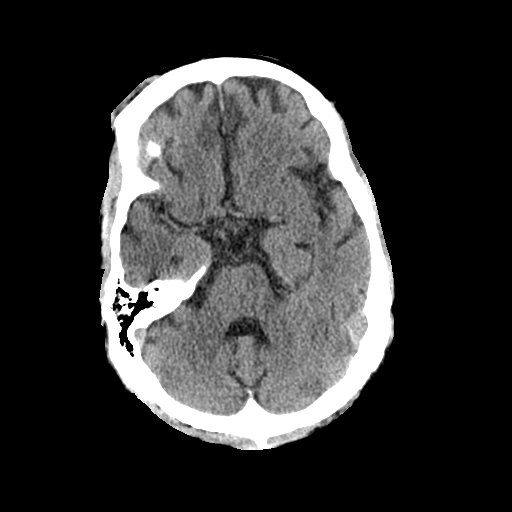
[im 16/35  brain]
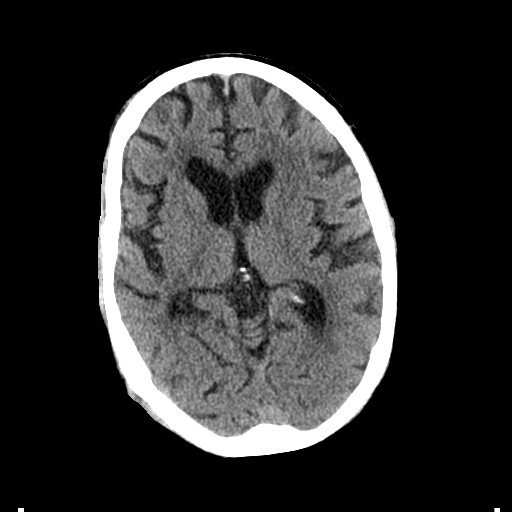
[im 19/35  brain]
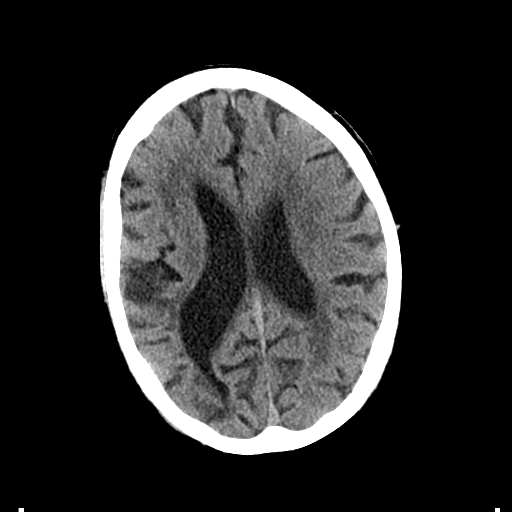
[im 19/35  bone]
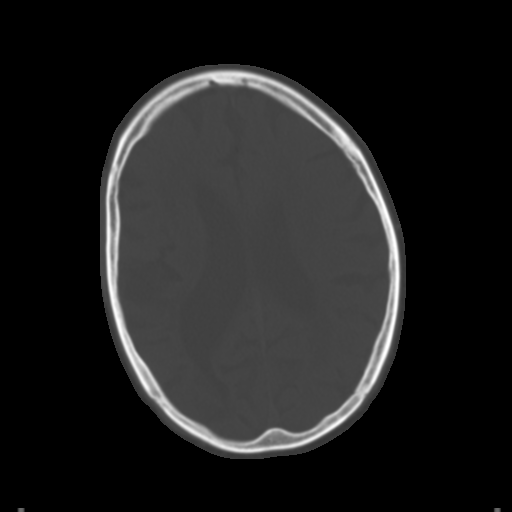
[im 24/35  brain]
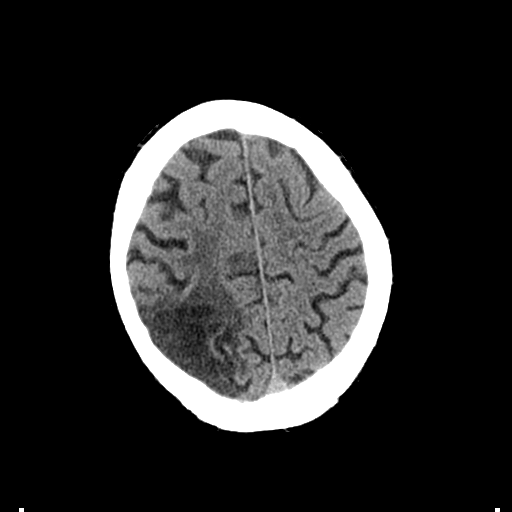
[im 27/35  brain]
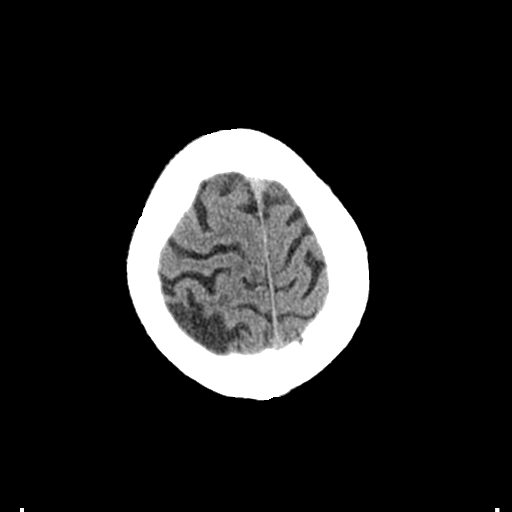
[im 32/35  brain]
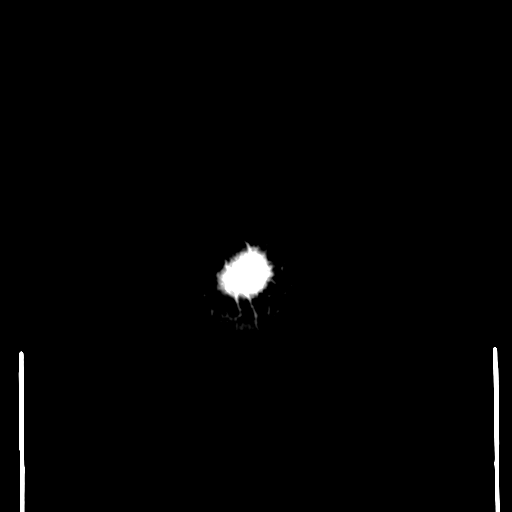

[Series 5: coronal soft tissue · coronal · 0.33mm/px · 3 of 75 slices shown]
[im 25/75  brain]
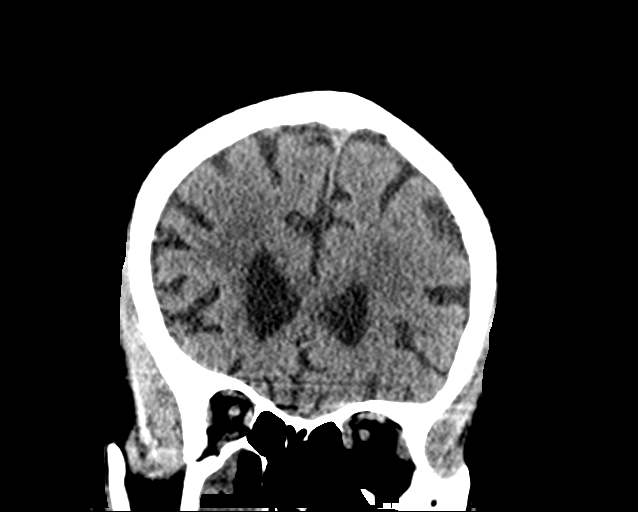
[im 33/75  brain]
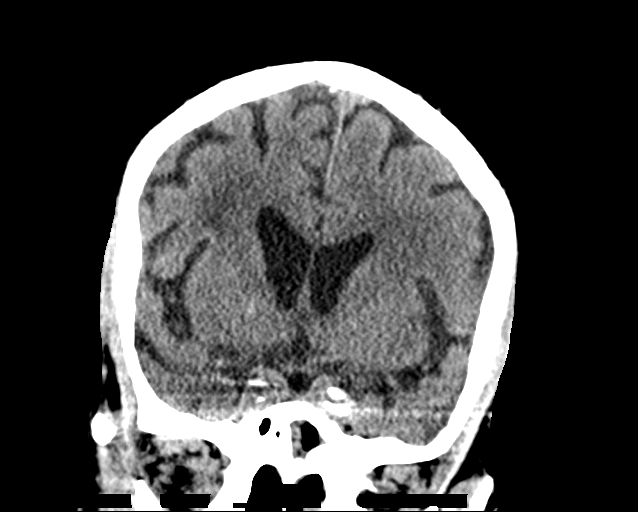
[im 42/75  brain]
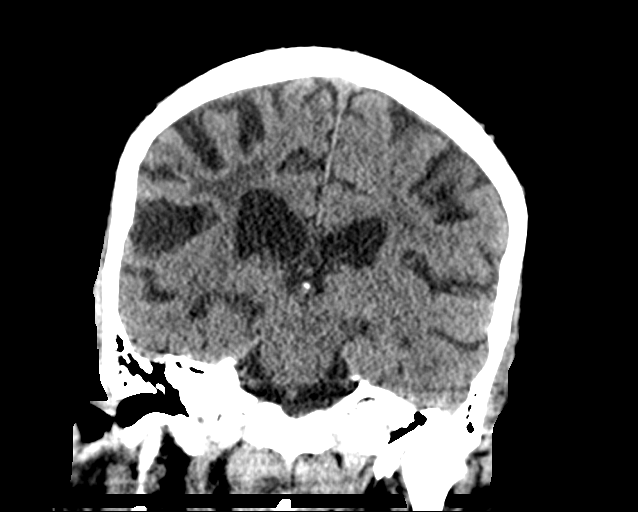

[Series 6: sagittal soft tissue · sagittal · 0.33mm/px · 3 of 56 slices shown]
[im 19/56  brain]
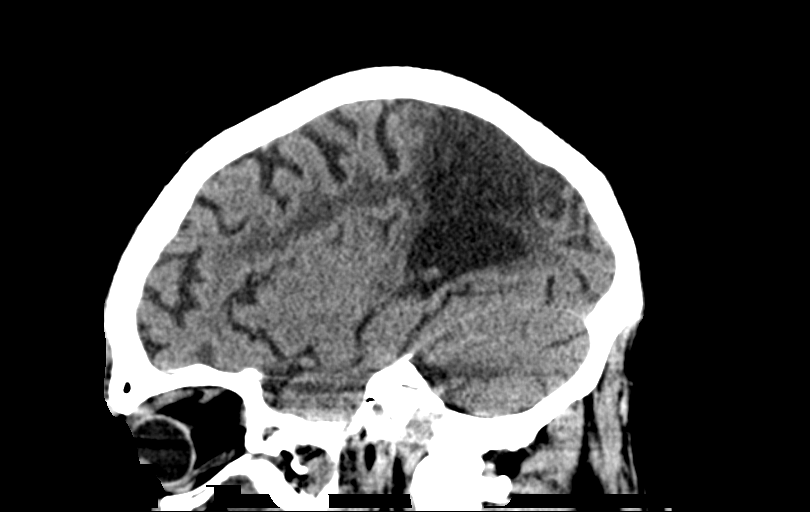
[im 28/56  brain]
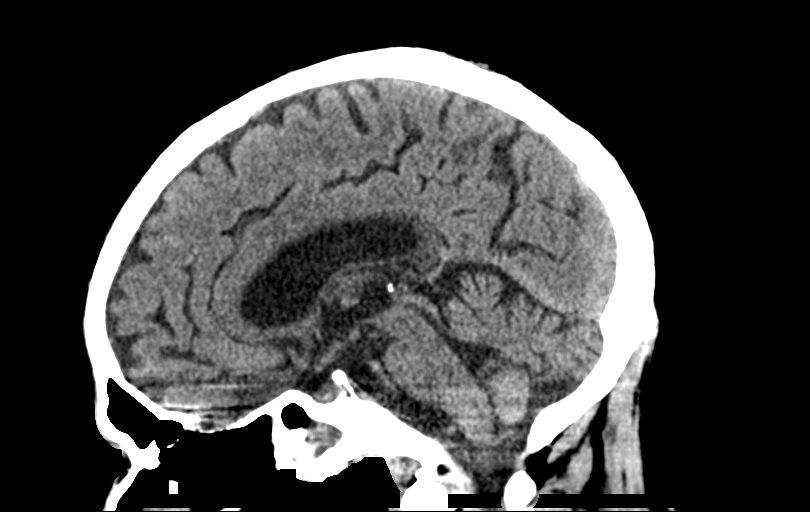
[im 37/56  brain]
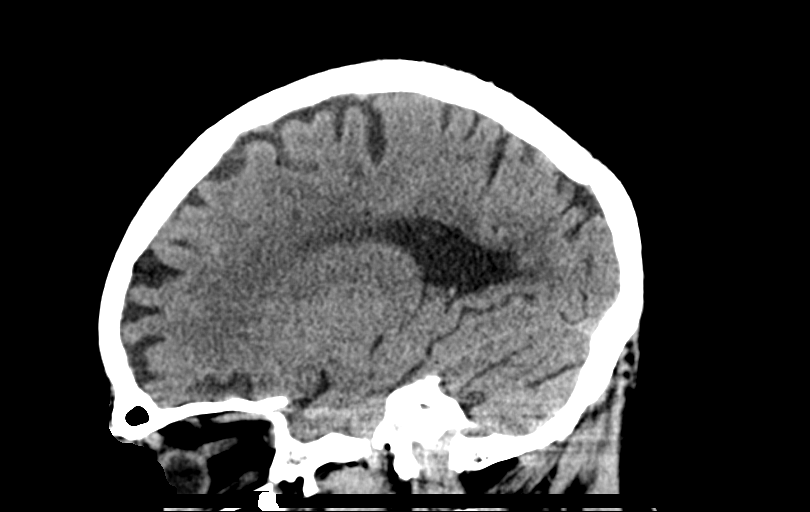

[14 of 47 positions shown; findings below may reference images not displayed]

FINDINGS: Brain: Right temporoparietal cortical encephalomalacia is unchanged.
Parenchymal volume loss is commensurate with the patient's age. Mild
periventricular white matter changes are present likely reflecting
the sequela of small vessel ischemia. No abnormal intra or
extra-axial mass lesion or fluid collection. No abnormal mass effect
or midline shift. No evidence of acute intracranial hemorrhage or
infarct. Ventricular size is normal. Cerebellum unremarkable.

Vascular: No asymmetric hyperdense vasculature at the skull base.

Skull: Intact

Sinuses/Orbits: There is dense opacification of the visualized right
maxillary sinus. Moderate mucosal thickening within the left
sphenoid sinus with bubbly secretions noted. Remaining paranasal
sinuses are clear. The orbits are unremarkable.

Other: Mastoid air cells and middle ear cavities are clear. Moderate
right supraorbital soft tissue swelling.
IMPRESSION: Right supraorbital soft tissue swelling. No acute intracranial
injury. No calvarial fracture.

Stable right temporoparietal encephalomalacia

Progressive paranasal sinus disease.

## 2020-11-11 IMAGING — DX DG CHEST 1V PORT
1 series · 1 of 1 positions shown · non-contrast
Comparison: [DATE]

CLINICAL DATA: 65-year-old male with altered mental status status
post fall.

EXAM:
PORTABLE CHEST - 1 VIEW

[chest ap]
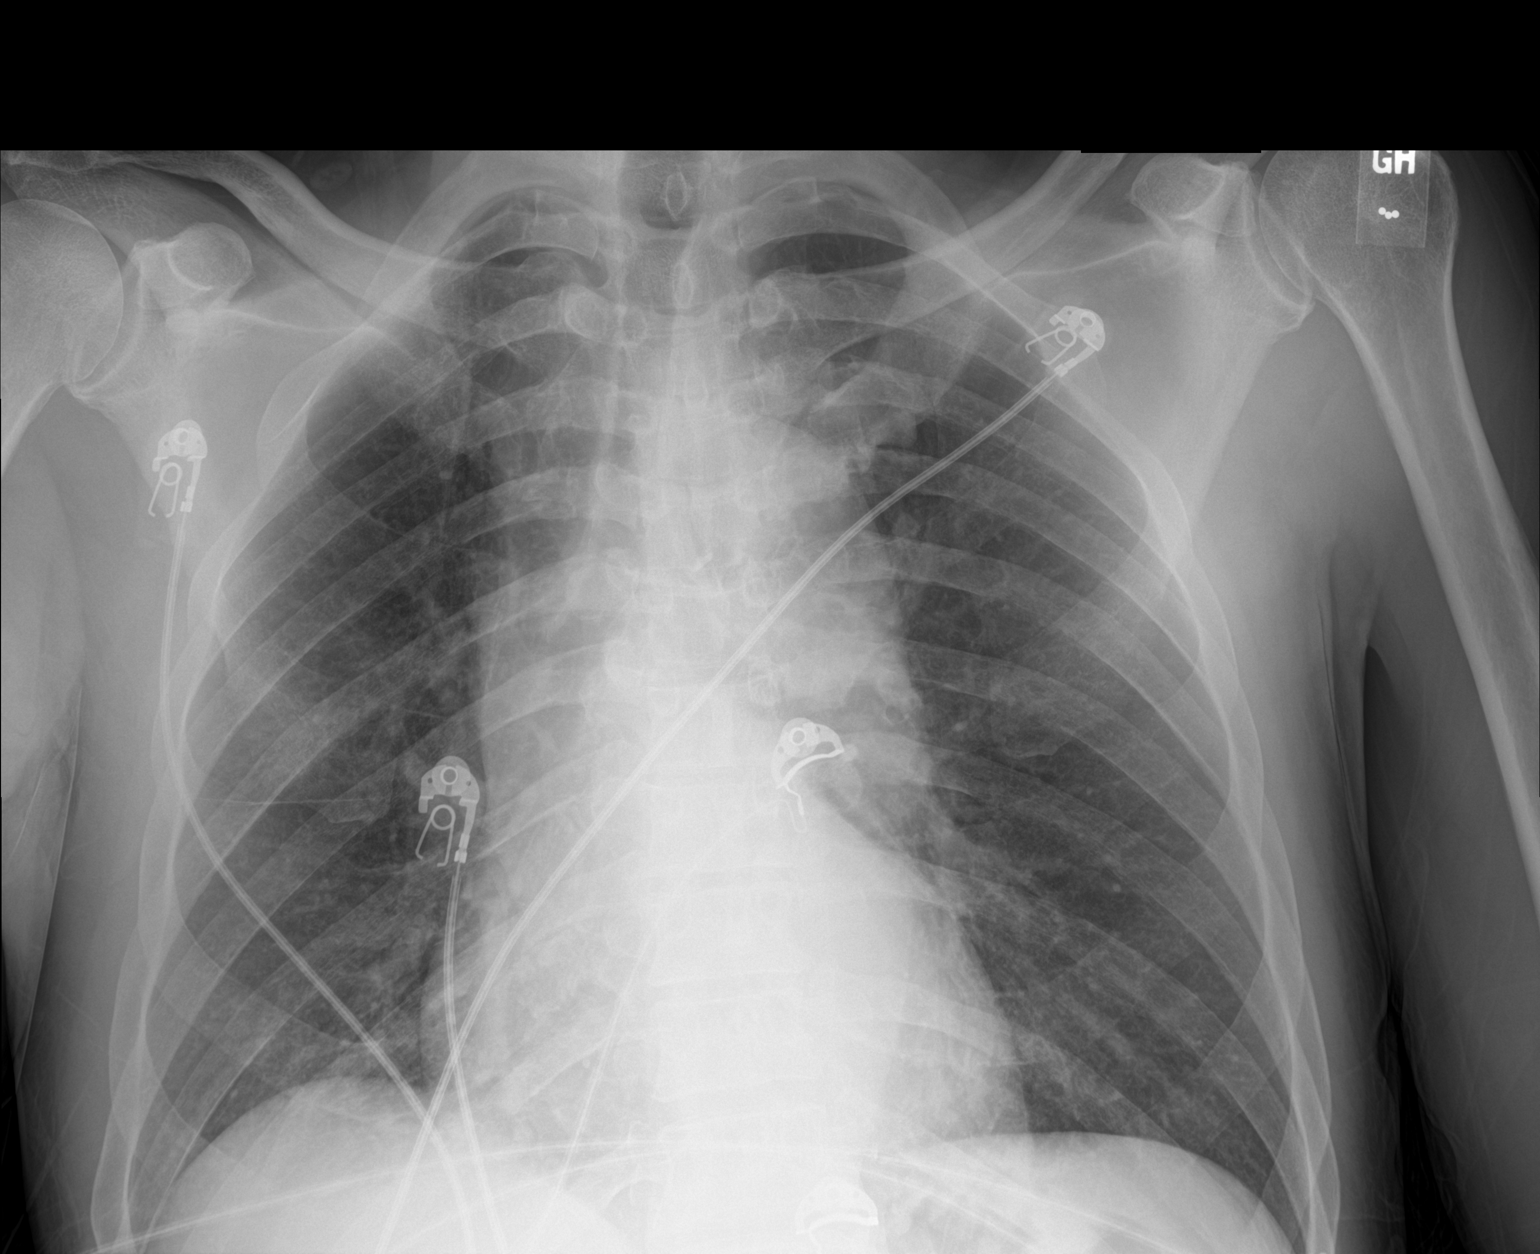

[1 of 1 positions shown; findings below may reference images not displayed]

FINDINGS: The mediastinal contours are within normal limits. No cardiomegaly.
The lungs are clear bilaterally without evidence of focal
consolidation, pleural effusion, or pneumothorax. No acute osseous
abnormality.
IMPRESSION: No acute cardiopulmonary process.

## 2020-11-11 MED ORDER — SODIUM CHLORIDE 0.9 % IV BOLUS
1000.0000 mL | Freq: Once | INTRAVENOUS | Status: AC
  Administered 2020-11-11: 1000 mL via INTRAVENOUS

## 2020-11-11 MED ORDER — ONDANSETRON HCL 4 MG/2ML IJ SOLN
4.0000 mg | Freq: Once | INTRAMUSCULAR | Status: AC
  Administered 2020-11-11: 4 mg via INTRAVENOUS
  Filled 2020-11-11: qty 2

## 2020-11-11 NOTE — ED Provider Notes (Signed)
[facility]-EMERGENCY DEPT Provider Note   CSN: [id number] Arrival date & time: 11/11/20  1356     History Chief Complaint  Patient presents with   Alcohol Intoxication    Dominic Carter is a 65 y.o. male.  Pt presents to the ED today because he was found passed out.  Pt has a hx of etoh abuse and drinks 1/2 L to 1 L per day of liquor.  He said he last drank this am.  He was in alcohol rehab until 7/1.  Pt denies any known falls, but c/o pain to his right forehead.        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)    Dyslipidemia 01/30/2020   Hepatitis, alcoholic, acute 01/30/2020   Seizure (HCC) 01/20/2020   ETOH abuse 01/20/2020   HTN (hypertension) 01/20/2020   Chronic ischemic right MCA stroke 01/20/2020    Past Surgical History:  Procedure Laterality Date   BELOW KNEE LEG AMPUTATION Right        Family History  Problem Relation Age of Onset   Hypertension Mother    Hypertension Sister    Hypertension Brother    Hypertension Maternal Grandmother    Hypertension Maternal Grandfather     Social History   Tobacco Use   Smoking status: Every Day    Packs/day: 1.00    Types: Cigarettes   Smokeless tobacco: Never  Vaping Use   Vaping Use: Never used  Substance Use Topics   Alcohol use: Yes    Comment: @ least 40 oz beer/day   Drug use: No    Home Medications Prior to Admission medications   Medication Sig Start Date End Date Taking? Authorizing Provider  amLODipine (NORVASC) 10 MG tablet Take 1 tablet (10 mg total) by mouth daily. 09/05/20   Karsten Ro, MD  aspirin EC 81 MG EC tablet Take 1 tablet (81 mg total) by mouth daily. Swallow whole. Patient not taking: Reported on  08/28/2020 01/27/20   Maretta Bees, MD  famotidine (PEPCID) 40 MG tablet Take 40 mg by mouth every evening. 08/08/20   [provider]  folic acid (FOLVITE) 1 MG tablet Take 1 tablet (1 mg total) by mouth daily. 09/06/20   Karsten Ro, MD  hydrocortisone cream 1 % Apply 1 application topically 2 (two) times daily. 09/05/20   Karsten Ro, MD  levETIRAcetam (KEPPRA) 500 MG tablet Take 1 tablet (500 mg total) by mouth 2 (two) times daily. 09/05/20   Karsten Ro, MD  lisinopril (ZESTRIL) 2.5 MG tablet Take 1 tablet (2.5 mg total) by mouth daily. 09/05/20   Karsten Ro, MD  Multiple Vitamin (MULTIVITAMIN WITH MINERALS) TABS tablet Take 1 tablet by mouth daily. 09/05/20   Karsten Ro, MD  nicotine (NICODERM CQ - DOSED IN MG/24 HOURS) 21 mg/24hr patch Place 1 patch (21 mg total) onto the skin daily. Patient not taking: Reported on 08/28/2020 02/09/20   Medina-Vargas, Monina C, NP  pravastatin (PRAVACHOL) 20 MG tablet Take 1 tablet (20 mg total) by mouth daily. 09/06/20   Karsten Ro, MD  thiamine 100 MG tablet Take 1 tablet (100 mg total) by mouth daily. 09/06/20  Karsten Ro, MD    Allergies    Patient has no known allergies.  Review of Systems   Review of Systems  Neurological:  Positive for headaches.  All other systems reviewed and are negative.  Physical Exam Updated Vital Signs BP (!) 148/75   Pulse 93   Temp 98.3 F (36.8 C) (Oral)   Resp 16   Ht 5\' 11"  (1.803 m)   Wt 69 kg   SpO2 91%   BMI 21.22 kg/m   Physical Exam Vitals and nursing note reviewed.  Constitutional:      Appearance: Normal appearance.  HENT:     Head: Normocephalic.      Right Ear: External ear normal.     Left Ear: External ear normal.     Nose: Nose normal.     Mouth/Throat:     Mouth: Mucous membranes are dry.  Eyes:     Extraocular Movements: Extraocular movements intact.     Conjunctiva/sclera: Conjunctivae normal.     Pupils: Pupils are equal, round, and reactive to light.   Cardiovascular:     Rate and Rhythm: Normal rate and regular rhythm.     Pulses: Normal pulses.     Heart sounds: Normal heart sounds.  Pulmonary:     Effort: Pulmonary effort is normal.     Breath sounds: Normal breath sounds.  Abdominal:     General: Abdomen is flat. Bowel sounds are normal.     Palpations: Abdomen is soft.  Musculoskeletal:     Cervical back: Normal range of motion and neck supple.     Comments: Left bka  Skin:    General: Skin is warm.  Neurological:     General: No focal deficit present.     Mental Status: He is alert and oriented to person, place, and time.  Psychiatric:        Mood and Affect: Mood normal.        Behavior: Behavior normal.    ED Results / Procedures / Treatments   Labs (all labs ordered are listed, but only abnormal results are displayed) Labs Reviewed  CBC WITH DIFFERENTIAL/PLATELET - Abnormal; Notable for the following components:      Result Value   Neutro Abs 7.9 (*)    Lymphs Abs 0.6 (*)    All other components within normal limits  COMPREHENSIVE METABOLIC PANEL - Abnormal; Notable for the following components:   CO2 18 (*)    Calcium 8.7 (*)    All other components within normal limits  URINALYSIS, ROUTINE W REFLEX MICROSCOPIC - Abnormal; Notable for the following components:   Hgb urine dipstick MODERATE (*)    Protein, ur 30 (*)    All other components within normal limits  ETHANOL - Abnormal; Notable for the following components:   Alcohol, Ethyl (B) 30 (*)    All other components within normal limits  AMMONIA - Abnormal; Notable for the following components:   Ammonia 36 (*)    All other components within normal limits  RESP PANEL BY RT-PCR (FLU A&B, COVID) ARPGX2  LIPASE, BLOOD  MAGNESIUM    EKG EKG Interpretation  Date/Time:  Sunday November 11 2020 14:19:37 EDT Ventricular Rate:  92 PR Interval:  165 QRS Duration: 81 QT Interval:  370 QTC Calculation: 458 R Axis:   49 Text Interpretation: Sinus rhythm  Abnormal R-wave progression, early transition No significant change since last tracing Confirmed by 10-28-1970 [phone]) on 11/11/2020 2:22:17 PM  Radiology CT HEAD WO CONTRAST (  )  Result Date: 11/11/2020 CLINICAL DATA:  Head injury, intoxication, Unwitnessed fall EXAM: CT HEAD WITHOUT CONTRAST TECHNIQUE: Contiguous axial images were obtained from the base of the skull through the vertex without intravenous contrast. COMPARISON:  08/28/2020 FINDINGS: Brain: Right temporoparietal cortical encephalomalacia is unchanged. Parenchymal volume loss is commensurate with the patient's age. Mild periventricular white matter changes are present likely reflecting the sequela of small vessel ischemia. No abnormal intra or extra-axial mass lesion or fluid collection. No abnormal mass effect or midline shift. No evidence of acute intracranial hemorrhage or infarct. Ventricular size is normal. Cerebellum unremarkable. Vascular: No asymmetric hyperdense vasculature at the skull base. Skull: Intact Sinuses/Orbits: There is dense opacification of the visualized right maxillary sinus. Moderate mucosal thickening within the left sphenoid sinus with bubbly secretions noted. Remaining paranasal sinuses are clear. The orbits are unremarkable. Other: Mastoid air cells and middle ear cavities are clear. Moderate right supraorbital soft tissue swelling. IMPRESSION: Right supraorbital soft tissue swelling. No acute intracranial injury. No calvarial fracture. Stable right temporoparietal encephalomalacia Progressive paranasal sinus disease. Electronically Signed   By: Helyn Numbers MD   On: 11/11/2020 14:39   DG Chest Portable 1 View  Result Date: 11/11/2020 CLINICAL DATA:  65 year old male with altered mental status status post fall. EXAM: PORTABLE CHEST - 1 VIEW COMPARISON:  01/19/2020 FINDINGS: The mediastinal contours are within normal limits. No cardiomegaly. The lungs are clear bilaterally without evidence of focal  consolidation, pleural effusion, or pneumothorax. No acute osseous abnormality. IMPRESSION: No acute cardiopulmonary process. Electronically Signed   By: Marliss Coots MD   On: 11/11/2020 15:24    Procedures Procedures   Medications Ordered in ED Medications  sodium chloride 0.9 % bolus 1,000 mL (0 mLs Intravenous Stopped 11/11/20 1639)  ondansetron (ZOFRAN) injection 4 mg (4 mg Intravenous Given 11/11/20 1436)    ED Course  I have reviewed the triage vital signs and the nursing notes.  Pertinent labs & imaging results that were available during my care of the patient were reviewed by me and considered in my medical decision making (see chart for details).    MDM Rules/Calculators/A&P                           Pt is feeling better after fluids.  He is stable for d/c.  He is encouraged not to drink, but it is unlikely that he will stop drinking.  Pt knows to return if worse.  Final Clinical Impression(s) / ED Diagnoses Final diagnoses:  Alcohol abuse    Rx / DC Orders ED Discharge Orders     None        Jacalyn Lefevre, MD 11/11/20 1644

## 2020-11-11 NOTE — ED Notes (Signed)
Patient presents after being passed out by family. Patient very malodorous and appears to have urinated on himself. Very poor personal hygiene. Patient admits to drinking ~ 1/2 of a liter of alcohol today.

## 2020-11-11 NOTE — ED Notes (Signed)
Patient awaiting [facility] transport home

## 2020-11-11 NOTE — ED Triage Notes (Addendum)
Patient brought from home by EMS. Patient finished inpatient rehab for ETOH abuse on 7/1. Today patient was found passed out after alcohol consumption. Patient admits to 1/2 liter of liquor today. Denies falling, hitting his head or any pain.

## 2020-11-11 NOTE — ED Notes (Signed)
[facility] arrives to take patient home. Patient states he has the code to the door to get and there is a ramp. Patient given discharge paperwork. All questions answered.

## 2020-11-25 ENCOUNTER — Emergency Department (HOSPITAL_COMMUNITY)
Admission: EM | Admit: 2020-11-25 | Discharge: 2020-11-26 | Disposition: A | Discharge status: Home / Self Care | Payer: Medicare Other | Attending: Emergency Medicine | Admitting: Emergency Medicine

## 2020-11-25 ENCOUNTER — Encounter (HOSPITAL_COMMUNITY): Payer: Self-pay | Admitting: Emergency Medicine

## 2020-11-25 ENCOUNTER — Emergency Department (HOSPITAL_COMMUNITY): Payer: Medicare Other

## 2020-11-25 ENCOUNTER — Other Ambulatory Visit: Payer: Self-pay

## 2020-11-25 DIAGNOSIS — I10 Essential (primary) hypertension: Secondary | ICD-10-CM | POA: Diagnosis not present

## 2020-11-25 DIAGNOSIS — Z79899 Other long term (current) drug therapy: Secondary | ICD-10-CM | POA: Diagnosis not present

## 2020-11-25 DIAGNOSIS — R079 Chest pain, unspecified: Secondary | ICD-10-CM | POA: Diagnosis not present

## 2020-11-25 DIAGNOSIS — Z7982 Long term (current) use of aspirin: Secondary | ICD-10-CM | POA: Diagnosis not present

## 2020-11-25 DIAGNOSIS — F17210 Nicotine dependence, cigarettes, uncomplicated: Secondary | ICD-10-CM | POA: Diagnosis not present

## 2020-11-25 DIAGNOSIS — R569 Unspecified convulsions: Secondary | ICD-10-CM | POA: Diagnosis present

## 2020-11-25 IMAGING — CT CT HEAD W/O CM
3 series · 15 of 47 positions shown, 18 images · non-contrast
Comparison: Head CT dated [DATE].

CLINICAL DATA: Seizure.

EXAM:
CT HEAD WITHOUT CONTRAST
TECHNIQUE: Contiguous axial images were obtained from the base of the skull
through the vertex without intravenous contrast.

[Series 3: head wo · axial · 0.46mm/px · z∈[-138,+2]mm · 9 of 34 slices shown, 12 images]
[im 3/34  brain]
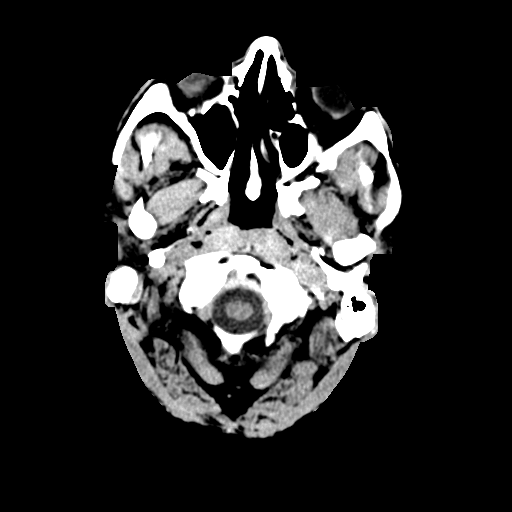
[im 3/34  bone]
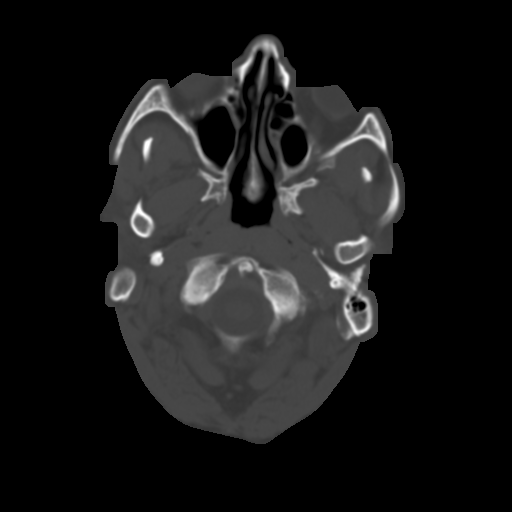
[im 6/34  brain]
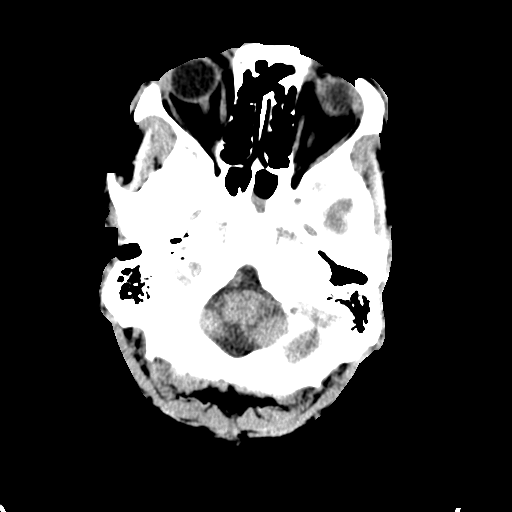
[im 10/34  brain]
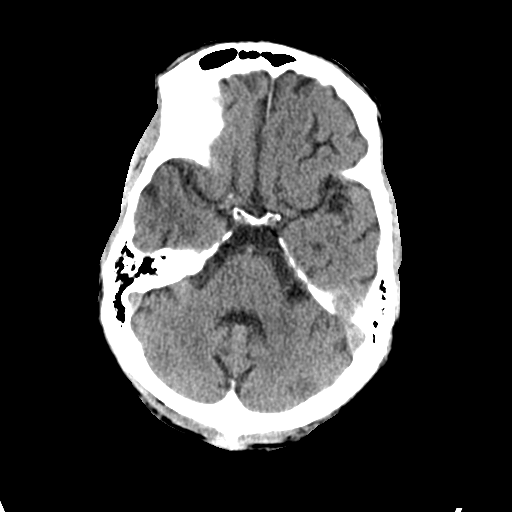
[im 13/34  brain]
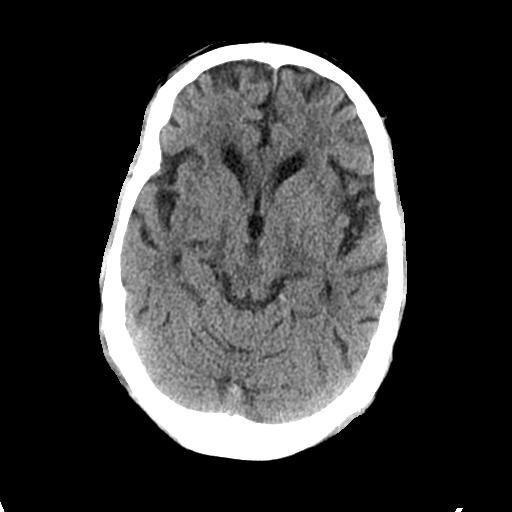
[im 18/34  brain]
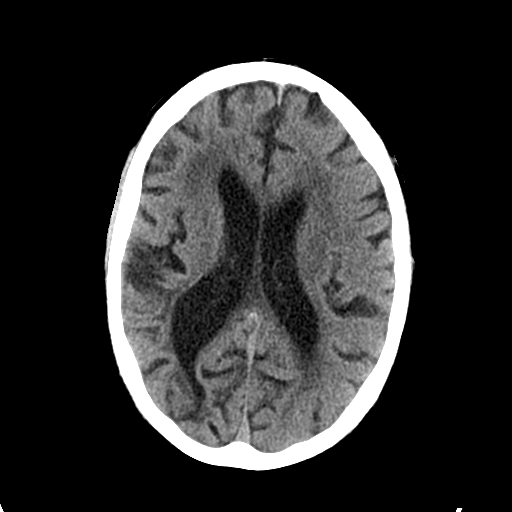
[im 18/34  bone]
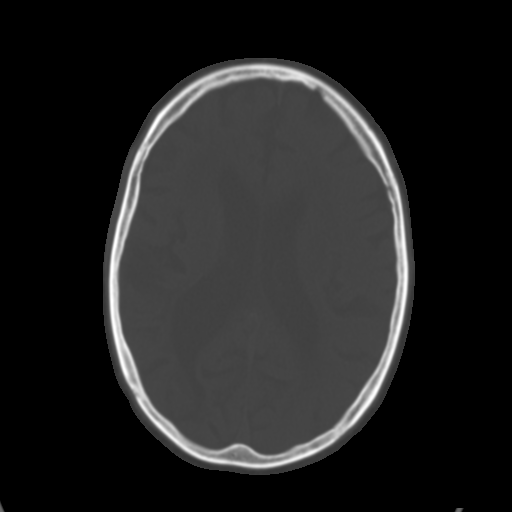
[im 21/34  brain]
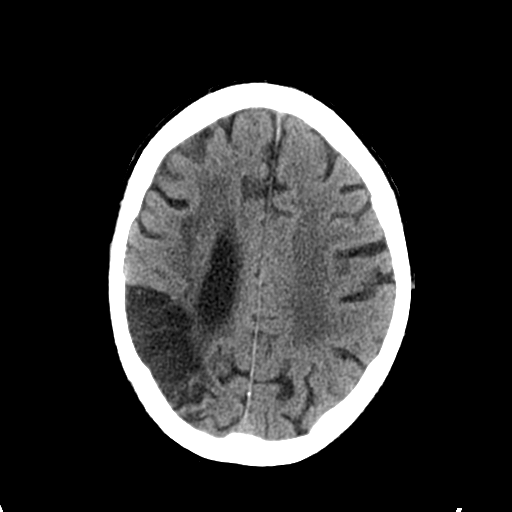
[im 24/34  brain]
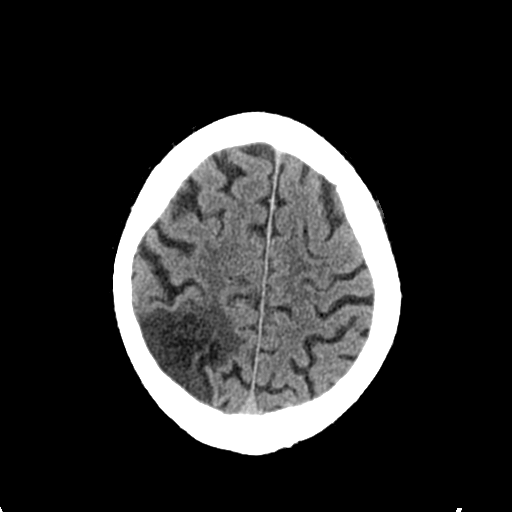
[im 28/34  brain]
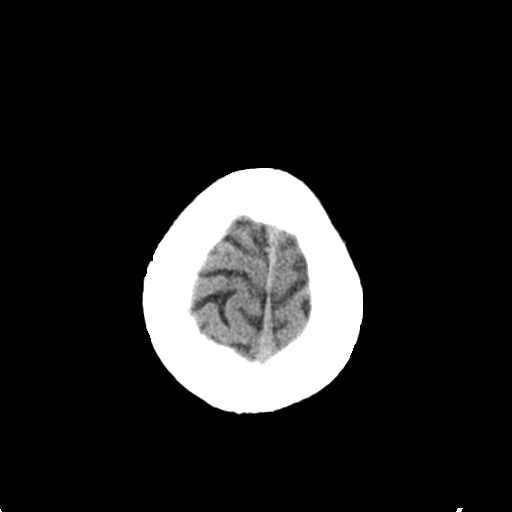
[im 31/34  brain]
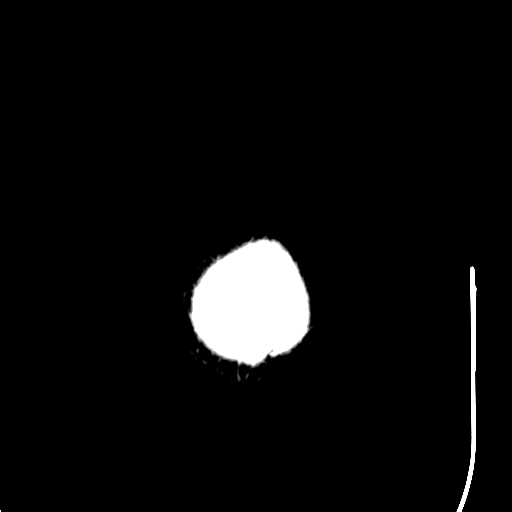
[im 31/34  bone]
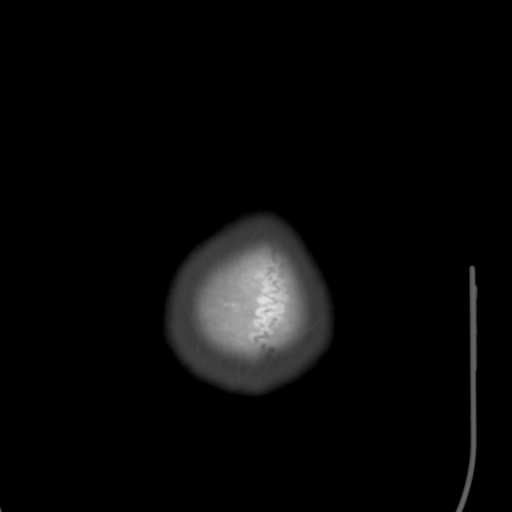

[Series 5: coronal soft tissue · coronal · 0.34mm/px · 3 of 74 slices shown]
[im 25/74  brain]
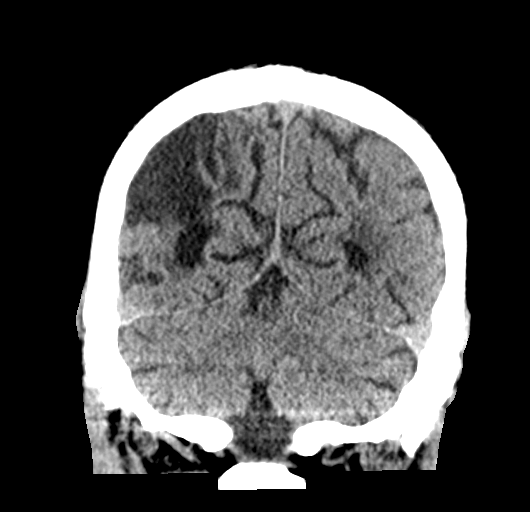
[im 33/74  brain]
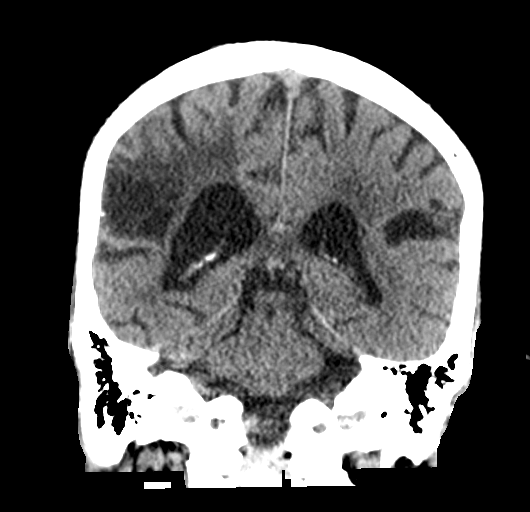
[im 41/74  brain]
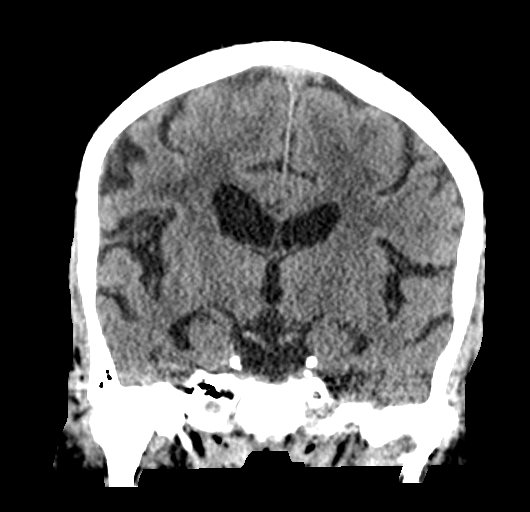

[Series 6: sagittal soft tissue · sagittal · 0.34mm/px · 3 of 60 slices shown]
[im 20/60  brain]
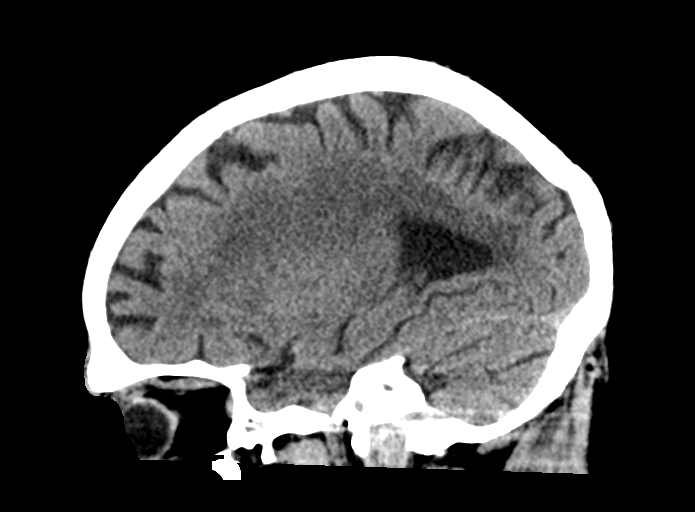
[im 30/60  brain]
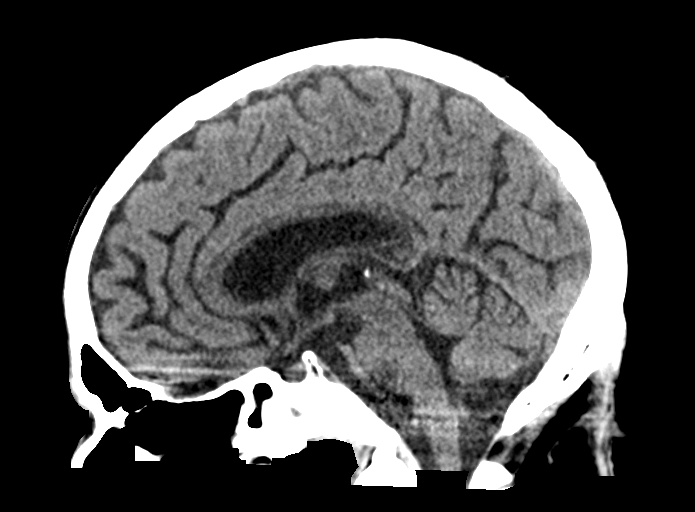
[im 40/60  brain]
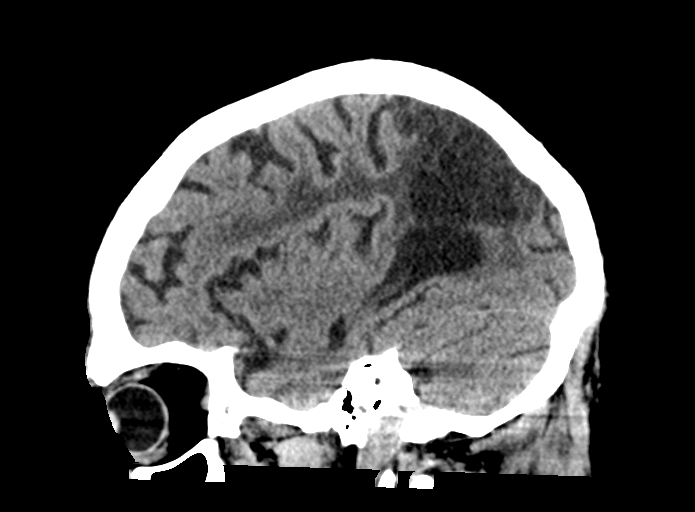

[15 of 47 positions shown; findings below may reference images not displayed]

FINDINGS: Brain: Mild age-related atrophy and chronic microvascular ischemic
changes. Area of old infarct and encephalomalacia involving the
right temporoparietal lobe. There is no acute intracranial
hemorrhage. No mass effect or midline shift. No extra-axial fluid
collection.

Vascular: No hyperdense vessel or unexpected calcification.

Skull: Normal. Negative for fracture or focal lesion.

Sinuses/Orbits: There is mucoperiosteal thickening of paranasal
sinuses with partial opacification of left sphenoid and right
maxillary sinuses. No air-fluid level. The mastoid air cells are
clear.

Other: None
IMPRESSION: 1. No acute intracranial pathology.
2. Mild age-related atrophy and chronic microvascular ischemic
changes. Old right temporoparietal infarct and encephalomalacia.

## 2020-11-25 MED ORDER — LEVETIRACETAM IN NACL 1500 MG/100ML IV SOLN
1500.0000 mg | Freq: Once | INTRAVENOUS | Status: AC
  Administered 2020-11-26: 1500 mg via INTRAVENOUS
  Filled 2020-11-25: qty 100

## 2020-11-25 NOTE — ED Provider Notes (Signed)
[facility]-EMERGENCY DEPT Provider Note   CSN: [id number] Arrival date & time: 11/25/20  2229     History Chief Complaint  Patient presents with   Seizures    Dominic Carter is a 65 y.o. male.  The history is provided by the patient and medical records.  Seizures Dominic Carter is a 65 y.o. male who presents to the Emergency Department complaining of seizure. He presents the emergency department by EMS for evaluation following his seizure at that per EMS lasted about one minute with associated incontinence. Patient states that he believes he had a seizure earlier today but does not recall the full events. He denies any injuries due to the seizure. He denies any recent illnesses. He states he has a history of seizures but has been noncompliant with his seizure medication since last hospital discharge in June. Denies any fevers, headache, chest pain, nausea, vomiting, diarrhea.    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)    Dyslipidemia 01/30/2020   Hepatitis, alcoholic, acute 01/30/2020   Seizure (HCC) 01/20/2020   ETOH abuse 01/20/2020   HTN (hypertension) 01/20/2020   Chronic ischemic right MCA stroke 01/20/2020    Past Surgical History:  Procedure Laterality Date   BELOW KNEE LEG AMPUTATION Right        Family History  Problem Relation Age of Onset   Hypertension Mother    Hypertension Sister    Hypertension Brother    Hypertension Maternal Grandmother    Hypertension Maternal Grandfather     Social History   Tobacco Use   Smoking status: Every Day    Packs/day: 1.00    Types: Cigarettes   Smokeless tobacco: Never  Vaping Use   Vaping Use: Never used  Substance Use Topics    Alcohol use: Yes    Comment: @ least 40 oz beer/day   Drug use: No    Home Medications Prior to Admission medications   Medication Sig Start Date End Date Taking? Authorizing Provider  amLODipine (NORVASC) 10 MG tablet Take 1 tablet (10 mg total) by mouth daily. 09/05/20   Karsten Ro, MD  aspirin EC 81 MG EC tablet Take 1 tablet (81 mg total) by mouth daily. Swallow whole. Patient not taking: Reported on 08/28/2020 01/27/20   Maretta Bees, MD  famotidine (PEPCID) 40 MG tablet Take 40 mg by mouth every evening. 08/08/20   [provider]  folic acid (FOLVITE) 1 MG tablet Take 1 tablet (1 mg total) by mouth daily. 09/06/20   Karsten Ro, MD  hydrocortisone cream 1 % Apply 1 application topically 2 (two) times daily. 09/05/20   Karsten Ro, MD  levETIRAcetam (KEPPRA) 500 MG tablet Take 1 tablet (500 mg total) by mouth 2 (two) times daily. 11/26/20   Tilden Fossa, MD  lisinopril (ZESTRIL) 2.5 MG tablet Take 1 tablet (2.5 mg total) by mouth daily. 09/05/20   Karsten Ro, MD  Multiple Vitamin (MULTIVITAMIN WITH MINERALS) TABS tablet Take 1 tablet by mouth daily. 09/05/20   Karsten Ro, MD  nicotine (NICODERM CQ - DOSED IN MG/24 HOURS) 21 mg/24hr patch Place 1 patch (21 mg total) onto the skin daily. Patient not  taking: Reported on 08/28/2020 02/09/20   Medina-Vargas, Monina C, NP  pravastatin (PRAVACHOL) 20 MG tablet Take 1 tablet (20 mg total) by mouth daily. 09/06/20   Karsten Ro, MD  thiamine 100 MG tablet Take 1 tablet (100 mg total) by mouth daily. 09/06/20   Karsten Ro, MD    Allergies    Patient has no known allergies.  Review of Systems   Review of Systems  Neurological:  Positive for seizures.  All other systems reviewed and are negative.  Physical Exam Updated Vital Signs BP 120/79 (BP Location: Left Arm)   Pulse 96   Temp 98.6 F (37 C) (Oral)   Resp 16   Ht 5\' 11"  (1.803 m)   Wt 69 kg   SpO2 98%   BMI 21.22 kg/m   Physical Exam Vitals and nursing note  reviewed.  Constitutional:      Appearance: He is well-developed.  HENT:     Head: Normocephalic and atraumatic.  Cardiovascular:     Rate and Rhythm: Normal rate and regular rhythm.     Heart sounds: No murmur heard. Pulmonary:     Effort: Pulmonary effort is normal. No respiratory distress.     Breath sounds: Normal breath sounds.  Abdominal:     Palpations: Abdomen is soft.     Tenderness: There is no abdominal tenderness. There is no guarding or rebound.  Musculoskeletal:        General: No tenderness.     Comments: Right BKA.  Skin:    General: Skin is warm and dry.  Neurological:     Mental Status: He is alert and oriented to person, place, and time.     Comments: No asymmetry of facial movements. Visual fields grossly intact. Generalized weakness.  Psychiatric:        Behavior: Behavior normal.    ED Results / Procedures / Treatments   Labs (all labs ordered are listed, but only abnormal results are displayed) Labs Reviewed  COMPREHENSIVE METABOLIC PANEL - Abnormal; Notable for the following components:      Result Value   Glucose, Bld 100 (*)    All other components within normal limits  CBC WITH DIFFERENTIAL/PLATELET - Abnormal; Notable for the following components:   RBC 4.19 (*)    Hemoglobin 12.5 (*)    HCT 37.1 (*)    Platelets 141 (*)    All other components within normal limits  ETHANOL  MAGNESIUM  URINALYSIS, ROUTINE W REFLEX MICROSCOPIC    EKG None  Radiology CT Head Wo Contrast  Result Date: 11/25/2020 CLINICAL DATA:  Seizure. EXAM: CT HEAD WITHOUT CONTRAST TECHNIQUE: Contiguous axial images were obtained from the base of the skull through the vertex without intravenous contrast. COMPARISON:  Head CT dated 11/11/2020. FINDINGS: Brain: Mild age-related atrophy and chronic microvascular ischemic changes. Area of old infarct and encephalomalacia involving the right temporoparietal lobe. There is no acute intracranial hemorrhage. No mass effect or  midline shift. No extra-axial fluid collection. Vascular: No hyperdense vessel or unexpected calcification. Skull: Normal. Negative for fracture or focal lesion. Sinuses/Orbits: There is mucoperiosteal thickening of paranasal sinuses with partial opacification of left sphenoid and right maxillary sinuses. No air-fluid level. The mastoid air cells are clear. Other: None IMPRESSION: 1. No acute intracranial pathology. 2. Mild age-related atrophy and chronic microvascular ischemic changes. Old right temporoparietal infarct and encephalomalacia. Electronically Signed   By: 01/11/2021 M.D.   On: 11/25/2020 23:51    Procedures Procedures   Medications Ordered in  ED Medications  levETIRAcetam (KEPPRA) IVPB 1500 mg/ 100 mL premix (0 mg Intravenous Stopped 11/26/20 0141)    ED Course  I have reviewed the triage vital signs and the nursing notes.  Pertinent labs & imaging results that were available during my care of the patient were reviewed by me and considered in my medical decision making (see chart for details).    MDM Rules/Calculators/A&P                          patient with history of alcohol abuse, seizure disorder, CVA here for evaluation of seizure. He is at his baseline on ED assessment. No clinical evidence of acute alcohol withdrawals. He states that he has been noncompliant with the seizure medication. Imaging is negative for acute abnormality. Labs without significant electrolyte abnormality. Discussed with patient home care for seizures. Plan to discharge home with outpatient follow-up and return precautions.  Final Clinical Impression(s) / ED Diagnoses Final diagnoses:  Seizure [facility])    Rx / DC Orders ED Discharge Orders          Ordered    levETIRAcetam (KEPPRA) 500 MG tablet  2 times daily        11/26/20 0133             Tilden Fossa, MD 11/26/20 [phone]

## 2020-11-25 NOTE — ED Triage Notes (Signed)
Pt BIB from home after seizure that last about 1 min per EMS. +incontinence. No fall or head injury. No mouth trauma or other trauma noted. Pt refused IV and EKG with EMS.

## 2020-11-26 ENCOUNTER — Emergency Department (HOSPITAL_COMMUNITY)
Admission: EM | Admit: 2020-11-26 | Discharge: 2020-11-26 | Disposition: A | Discharge status: Home / Self Care | Payer: Medicare Other | Source: Home / Self Care | Attending: Emergency Medicine | Admitting: Emergency Medicine

## 2020-11-26 ENCOUNTER — Encounter (HOSPITAL_COMMUNITY): Payer: Self-pay

## 2020-11-26 ENCOUNTER — Emergency Department (HOSPITAL_COMMUNITY): Payer: Medicare Other

## 2020-11-26 ENCOUNTER — Other Ambulatory Visit: Payer: Self-pay

## 2020-11-26 DIAGNOSIS — Z7982 Long term (current) use of aspirin: Secondary | ICD-10-CM | POA: Insufficient documentation

## 2020-11-26 DIAGNOSIS — Z79899 Other long term (current) drug therapy: Secondary | ICD-10-CM | POA: Insufficient documentation

## 2020-11-26 DIAGNOSIS — R079 Chest pain, unspecified: Secondary | ICD-10-CM | POA: Insufficient documentation

## 2020-11-26 DIAGNOSIS — F17210 Nicotine dependence, cigarettes, uncomplicated: Secondary | ICD-10-CM | POA: Insufficient documentation

## 2020-11-26 DIAGNOSIS — I10 Essential (primary) hypertension: Secondary | ICD-10-CM | POA: Insufficient documentation

## 2020-11-26 DIAGNOSIS — R569 Unspecified convulsions: Secondary | ICD-10-CM | POA: Insufficient documentation

## 2020-11-26 DIAGNOSIS — R072 Precordial pain: Secondary | ICD-10-CM

## 2020-11-26 LAB — COMPREHENSIVE METABOLIC PANEL
ALT: 16 U/L (ref 0–44)
AST: 34 U/L (ref 15–41)
Albumin: 3.7 g/dL (ref 3.5–5.0)
Alkaline Phosphatase: 72 U/L (ref 38–126)
Anion gap: 11 (ref 5–15)
BUN: 11 mg/dL (ref 8–23)
CO2: 25 mmol/L (ref 22–32)
Calcium: 9.2 mg/dL (ref 8.9–10.3)
Chloride: 99 mmol/L (ref 98–111)
Creatinine, Ser: 0.85 mg/dL (ref 0.61–1.24)
GFR, Estimated: >60 mL/min (ref >60)
Glucose, Bld: 100 mg/dL — ABNORMAL HIGH (ref 70–99)
Potassium: 3.9 mmol/L (ref 3.5–5.1)
Sodium: 135 mmol/L (ref 135–145)
Total Bilirubin: 1 mg/dL (ref 0.3–1.2)
Total Protein: 7.4 g/dL (ref 6.5–8.1)

## 2020-11-26 LAB — TROPONIN I (HIGH SENSITIVITY)
Troponin I (High Sensitivity): 14 ng/L (ref <18)
Troponin I (High Sensitivity): 12 ng/L (ref <18)

## 2020-11-26 LAB — CBC WITH DIFFERENTIAL/PLATELET
Abs Immature Granulocytes: 0.02 10*3/uL (ref 0.00–0.07)
Basophils Absolute: 0 10*3/uL (ref 0.0–0.1)
Basophils Relative: 1 %
Eosinophils Absolute: 0 10*3/uL (ref 0.0–0.5)
Eosinophils Relative: 0 %
HCT: 37.1 % — ABNORMAL LOW (ref 39.0–52.0)
Hemoglobin: 12.5 g/dL — ABNORMAL LOW (ref 13.0–17.0)
Immature Granulocytes: 0 %
Lymphocytes Relative: 11 %
Lymphs Abs: 0.9 10*3/uL (ref 0.7–4.0)
MCH: 29.8 pg (ref 26.0–34.0)
MCHC: 33.7 g/dL (ref 30.0–36.0)
MCV: 88.5 fL (ref 80.0–100.0)
Monocytes Absolute: 0.9 10*3/uL (ref 0.1–1.0)
Monocytes Relative: 11 %
Neutro Abs: 6.3 10*3/uL (ref 1.7–7.7)
Neutrophils Relative %: 77 %
Platelets: 141 10*3/uL — ABNORMAL LOW (ref 150–400)
RBC: 4.19 MIL/uL — ABNORMAL LOW (ref 4.22–5.81)
RDW: 14.5 % (ref 11.5–15.5)
WBC: 8.1 10*3/uL (ref 4.0–10.5)
nRBC: 0 % (ref 0.0–0.2)

## 2020-11-26 LAB — ETHANOL: Alcohol, Ethyl (B): <10 mg/dL (ref <10)

## 2020-11-26 LAB — MAGNESIUM: Magnesium: 2.1 mg/dL (ref 1.7–2.4)

## 2020-11-26 IMAGING — CR DG CHEST 2V
2 series · 2 of 2 positions shown · non-contrast
Comparison: Chest x-ray [DATE].

CLINICAL DATA: 65-year-old male with history of chest pain.

EXAM:
CHEST - 2 VIEW

[w chest pa]
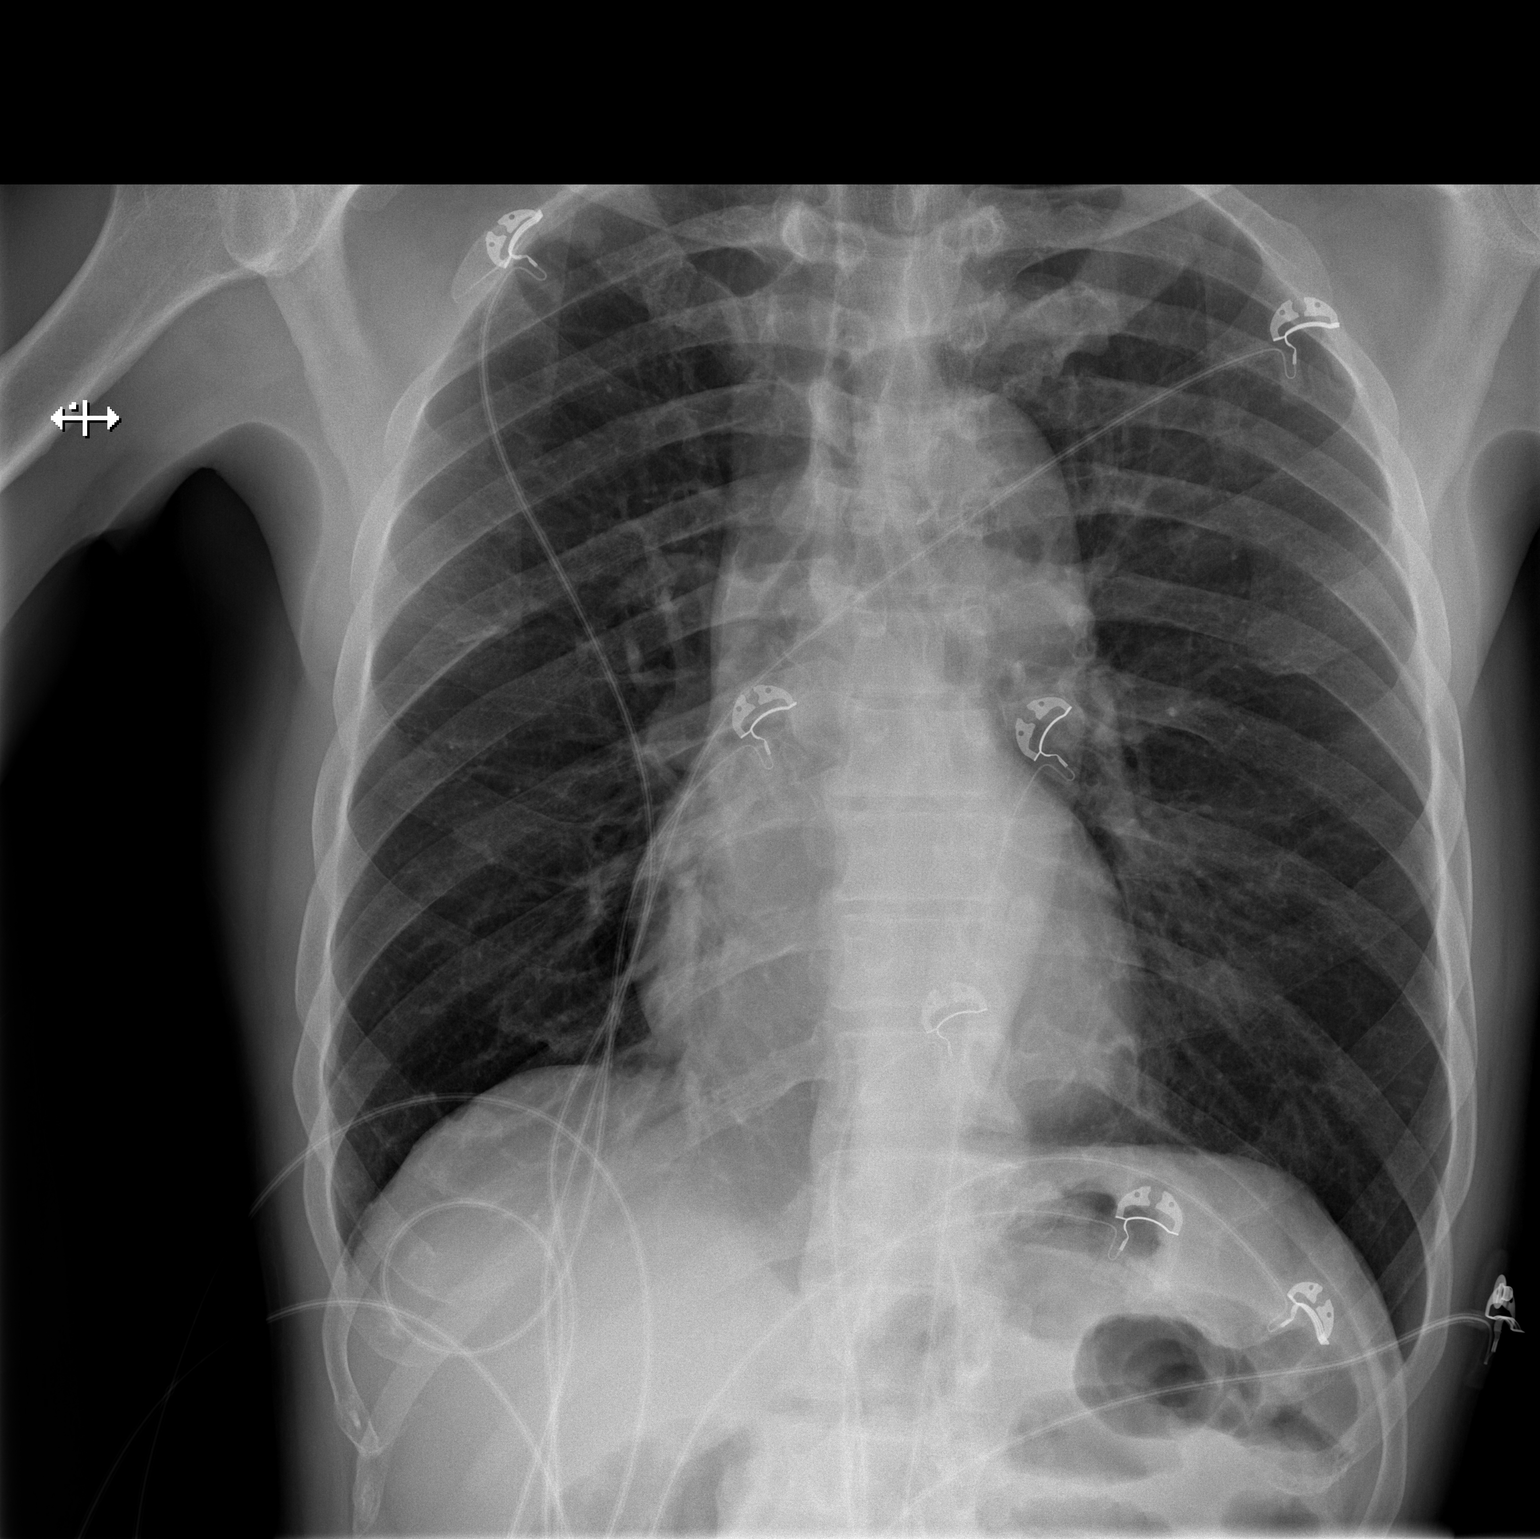

[w chest lat]
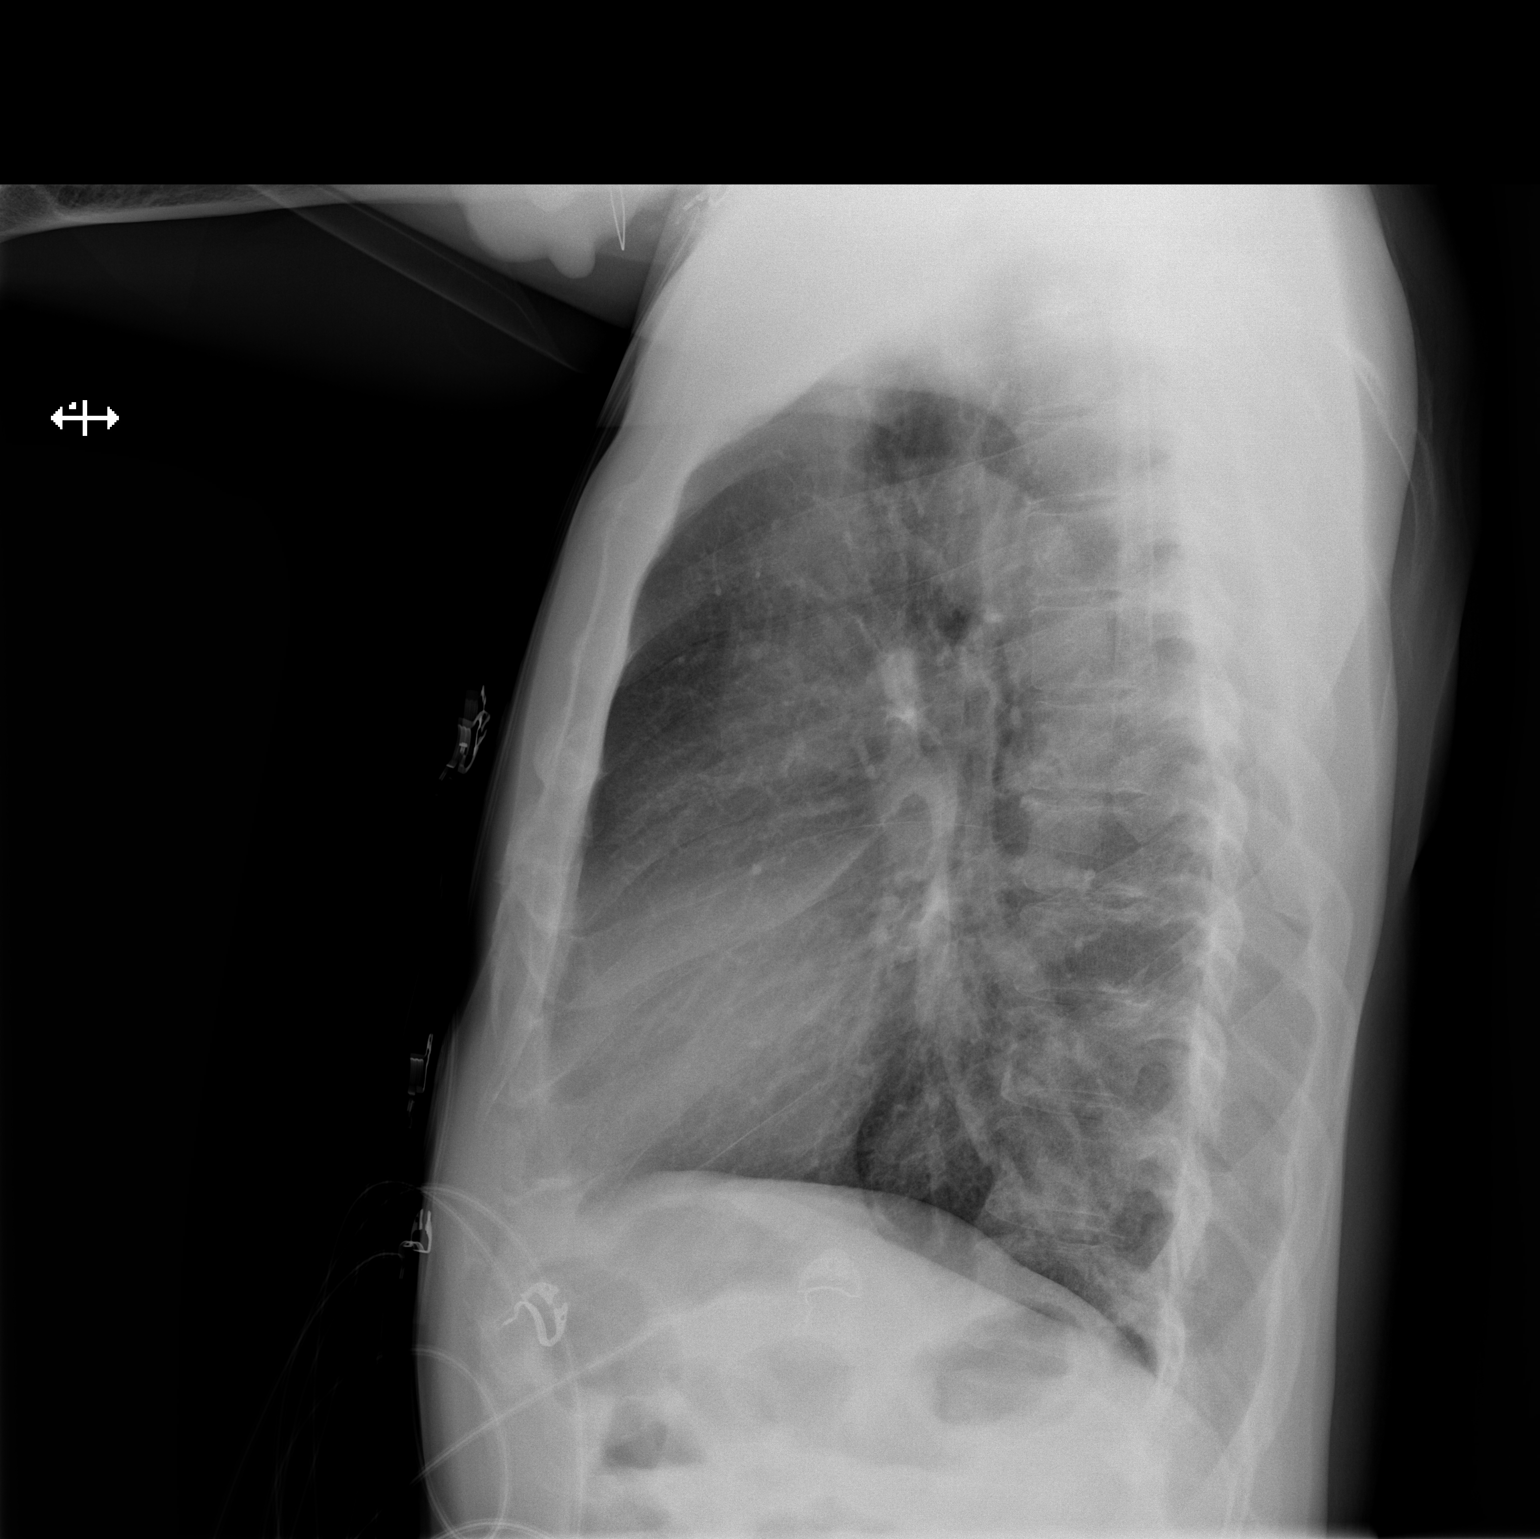

[2 of 2 positions shown; findings below may reference images not displayed]

FINDINGS: Lung volumes are normal. No consolidative airspace disease. No
pleural effusions. No pneumothorax. No pulmonary nodule or mass
noted. Pulmonary vasculature and the cardiomediastinal silhouette
are within normal limits.
IMPRESSION: No radiographic evidence of acute cardiopulmonary disease.

## 2020-11-26 MED ORDER — LEVETIRACETAM 500 MG PO TABS: 500.0000 mg | ORAL_TABLET | Freq: Two times a day (BID) | ORAL | 0 refills | Status: DC

## 2020-11-26 MED ORDER — ALUM & MAG HYDROXIDE-SIMETH 200-200-20 MG/5ML PO SUSP
30.0000 mL | Freq: Once | ORAL | Status: AC
  Administered 2020-11-26: 30 mL via ORAL
  Filled 2020-11-26: qty 30

## 2020-11-26 NOTE — ED Triage Notes (Signed)
Patient complaining of not having a ride home. Patient states he has chest pain due to not finding a ride home.

## 2020-11-26 NOTE — ED Notes (Signed)
Requested urine from patient. 

## 2020-11-26 NOTE — ED Notes (Signed)
Patient transported to X-ray 

## 2020-11-26 NOTE — ED Notes (Signed)
[facility] called for transportation back home. 

## 2020-11-26 NOTE — ED Provider Notes (Signed)
  Physical Exam  BP 129/83 (BP Location: Right Arm)   Pulse 85   Temp 97.8 F (36.6 C) (Oral)   Resp 16   SpO2 100%   Physical Exam Vitals and nursing note reviewed.  Constitutional:      General: He is not in acute distress.    Appearance: He is well-developed.  HENT:     Head: Normocephalic and atraumatic.     Right Ear: External ear normal.     Left Ear: External ear normal.     Mouth/Throat:     Mouth: Mucous membranes are moist.  Eyes:     General: No scleral icterus. Cardiovascular:     Rate and Rhythm: Normal rate and regular rhythm.     Pulses: Normal pulses.     Heart sounds: Normal heart sounds.  Pulmonary:     Effort: Pulmonary effort is normal. No respiratory distress.     Breath sounds: Normal breath sounds.  Abdominal:     General: Abdomen is flat.     Palpations: Abdomen is soft.     Tenderness: There is no abdominal tenderness.  Musculoskeletal:        General: Normal range of motion.     Cervical back: Normal range of motion.     Right lower leg: No edema.     Left lower leg: No edema.  Skin:    General: Skin is warm and dry.     Capillary Refill: Capillary refill takes less than 2 seconds.  Neurological:     Mental Status: He is alert and oriented to person, place, and time.  Psychiatric:        Mood and Affect: Mood normal.        Behavior: Behavior normal.    ED Course/Procedures     Procedures  MDM   Patient received at handoff  See previous ER note from earlier in the day.  65 year old male with history of chronic alcohol abuse, previous stroke.  Patient history of recurrent seizure, he was evaluated for this earlier in the ER and was discharged in stable condition.  Patient unable to get a ride home so he returned to the ER complaining of chest pain.  Pain described as similar to acid reflux.  Nonradiating.  Pain relieved with Maalox.  EKG reviewed and is stable.   Favor atypical chest pain.  Patient in no distress and overall  condition improved here in the ED. Troponin negative x2. CXR reviewed. Labs without demonstration of acute pathology unless otherwise noted above.  Given the extremely low risk further testing and evaluation as an inpatient does not appear to be indicated at this time. Detailed discussions were had with the patient regarding current findings, and is agreeable to follow up with their PCP or cardiologist within the next few days. The patient has been instructed to return immediately if the symptoms worsen in any way for re-evaluation. Patient verbalized understanding and is in agreement with current care plan. All questions answered prior to discharge.    Sloan Leiter, DO 11/26/20 1536

## 2020-11-26 NOTE — ED Provider Notes (Signed)
[facility]-EMERGENCY DEPT Provider Note   CSN: [id number] Arrival date & time: 11/26/20  0414     History Chief Complaint  Patient presents with   Chest Pain    Dominic Carter is a 65 y.o. male.  The history is provided by the patient.  Chest Pain Dominic Carter is a 65 y.o. male who presents to the Emergency Department complaining of chest pain. He presents today the emergency department complaining of left-sided chest pain. He states that this started just after being discharged from the emergency department following this evaluation for seizure. He states that the pain started in the context of not being able to find a ride home. He thinks that it might be indigestion. He denies any associated fevers, shortness of breath, cough, nausea, vomiting. Symptoms are mild in nature. Pain is described as a hurting that comes and goes.    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)    Dyslipidemia 01/30/2020   Hepatitis, alcoholic, acute 01/30/2020   Seizure (HCC) 01/20/2020   ETOH abuse 01/20/2020   HTN (hypertension) 01/20/2020   Chronic ischemic right MCA stroke 01/20/2020    Past Surgical History:  Procedure Laterality Date   BELOW KNEE LEG AMPUTATION Right        Family History  Problem Relation Age of Onset   Hypertension Mother    Hypertension Sister    Hypertension Brother    Hypertension Maternal Grandmother    Hypertension Maternal Grandfather     Social History   Tobacco Use   Smoking status: Every Day    Packs/day: 1.00    Types: Cigarettes   Smokeless tobacco: Never  Vaping Use   Vaping Use: Never used  Substance Use Topics   Alcohol use: Yes    Comment: @ least 40 oz  beer/day   Drug use: No    Home Medications Prior to Admission medications   Medication Sig Start Date End Date Taking? Authorizing Provider  amLODipine (NORVASC) 10 MG tablet Take 1 tablet (10 mg total) by mouth daily. 09/05/20   Karsten Ro, MD  aspirin EC 81 MG EC tablet Take 1 tablet (81 mg total) by mouth daily. Swallow whole. Patient not taking: Reported on 08/28/2020 01/27/20   Maretta Bees, MD  famotidine (PEPCID) 40 MG tablet Take 40 mg by mouth every evening. 08/08/20   [provider]  folic acid (FOLVITE) 1 MG tablet Take 1 tablet (1 mg total) by mouth daily. 09/06/20   Karsten Ro, MD  hydrocortisone cream 1 % Apply 1 application topically 2 (two) times daily. 09/05/20   Karsten Ro, MD  levETIRAcetam (KEPPRA) 500 MG tablet Take 1 tablet (500 mg total) by mouth 2 (two) times daily. 11/26/20   Tilden Fossa, MD  lisinopril (ZESTRIL) 2.5 MG tablet Take 1 tablet (2.5 mg total) by mouth daily. 09/05/20   Karsten Ro, MD  Multiple Vitamin (MULTIVITAMIN WITH MINERALS) TABS tablet Take 1 tablet by mouth daily. 09/05/20   Karsten Ro, MD  nicotine (NICODERM CQ - DOSED IN MG/24 HOURS) 21 mg/24hr patch Place 1 patch (21 mg total) onto the skin daily. Patient not taking: Reported on 08/28/2020 02/09/20  Medina-Vargas, Monina C, NP  pravastatin (PRAVACHOL) 20 MG tablet Take 1 tablet (20 mg total) by mouth daily. 09/06/20   Karsten Ro, MD  thiamine 100 MG tablet Take 1 tablet (100 mg total) by mouth daily. 09/06/20   Karsten Ro, MD    Allergies    Patient has no known allergies.  Review of Systems   Review of Systems  Cardiovascular:  Positive for chest pain.  All other systems reviewed and are negative.  Physical Exam Updated Vital Signs BP 138/80   Pulse 92   Temp 97.8 F (36.6 C) (Oral)   Resp 11   SpO2 100%   Physical Exam Vitals and nursing note reviewed.  Constitutional:      Appearance: He is well-developed.  HENT:     Head: Normocephalic and  atraumatic.  Cardiovascular:     Rate and Rhythm: Normal rate and regular rhythm.     Heart sounds: No murmur heard. Pulmonary:     Effort: Pulmonary effort is normal. No respiratory distress.     Breath sounds: Normal breath sounds.  Abdominal:     Palpations: Abdomen is soft.     Tenderness: There is no abdominal tenderness. There is no guarding or rebound.  Musculoskeletal:        General: No swelling or tenderness.     Comments: Right lower extremity BKA.  Skin:    General: Skin is warm and dry.  Neurological:     Mental Status: He is alert and oriented to person, place, and time.  Psychiatric:        Behavior: Behavior normal.    ED Results / Procedures / Treatments   Labs (all labs ordered are listed, but only abnormal results are displayed) Labs Reviewed  TROPONIN I (HIGH SENSITIVITY)    EKG EKG Interpretation  Date/Time:  Monday November 26 2020 04:33:27 EDT Ventricular Rate:  80 PR Interval:  156 QRS Duration: 85 QT Interval:  379 QTC Calculation: 438 R Axis:   36 Text Interpretation: Sinus rhythm Abnormal R-wave progression, early transition Borderline ST elevation, anterior leads  c/w early repolarization 12 Lead; Mason-Likar Confirmed by Tilden Fossa [phone]) on 11/26/2020 4:58:29 AM  Radiology CT Head Wo Contrast  Result Date: 11/25/2020 CLINICAL DATA:  Seizure. EXAM: CT HEAD WITHOUT CONTRAST TECHNIQUE: Contiguous axial images were obtained from the base of the skull through the vertex without intravenous contrast. COMPARISON:  Head CT dated 11/11/2020. FINDINGS: Brain: Mild age-related atrophy and chronic microvascular ischemic changes. Area of old infarct and encephalomalacia involving the right temporoparietal lobe. There is no acute intracranial hemorrhage. No mass effect or midline shift. No extra-axial fluid collection. Vascular: No hyperdense vessel or unexpected calcification. Skull: Normal. Negative for fracture or focal lesion. Sinuses/Orbits: There is  mucoperiosteal thickening of paranasal sinuses with partial opacification of left sphenoid and right maxillary sinuses. No air-fluid level. The mastoid air cells are clear. Other: None IMPRESSION: 1. No acute intracranial pathology. 2. Mild age-related atrophy and chronic microvascular ischemic changes. Old right temporoparietal infarct and encephalomalacia. Electronically Signed   By: Elgie Collard M.D.   On: 11/25/2020 23:51    Procedures Procedures   Medications Ordered in ED Medications  alum & mag hydroxide-simeth (MAALOX/MYLANTA) 200-200-20 MG/5ML suspension 30 mL (has no administration in time range)    ED Course  I have reviewed the triage vital signs and the nursing notes.  Pertinent labs & imaging results that were available during my care of the patient were reviewed by me and considered  in my medical decision making (see chart for details).    MDM Rules/Calculators/A&P                          patient here for evaluation of chest pain. Chest pain developed in the setting of him not having transportation home following ED evaluation for seizure. EKG is similar when compared to priors in initial troponin is negative. He does have a history of DVT but current presentation is not consistent with PE. He has no lower extremity edema, no shortness of breath and he is setting 100% on room air. Patient care transferred pending repeat troponin.  Final Clinical Impression(s) / ED Diagnoses Final diagnoses:  None    Rx / DC Orders ED Discharge Orders     None        Tilden Fossa, MD 11/26/20 [phone]

## 2020-11-26 NOTE — ED Notes (Signed)
Pt stated, "man, I need to find a ride home after this." Then asked, "when did the ambulance stop bringing people home?"

## 2020-12-09 ENCOUNTER — Emergency Department (HOSPITAL_COMMUNITY)
Admission: EM | Admit: 2020-12-09 | Discharge: 2020-12-09 | Disposition: A | Discharge status: Home / Self Care | Payer: Medicare Other | Attending: Emergency Medicine | Admitting: Emergency Medicine

## 2020-12-09 ENCOUNTER — Encounter (HOSPITAL_COMMUNITY): Payer: Self-pay | Admitting: Emergency Medicine

## 2020-12-09 ENCOUNTER — Other Ambulatory Visit: Payer: Self-pay

## 2020-12-09 DIAGNOSIS — R000 Tachycardia, unspecified: Secondary | ICD-10-CM | POA: Diagnosis not present

## 2020-12-09 DIAGNOSIS — E875 Hyperkalemia: Secondary | ICD-10-CM

## 2020-12-09 DIAGNOSIS — Y908 Blood alcohol level of 240 mg/100 ml or more: Secondary | ICD-10-CM | POA: Diagnosis not present

## 2020-12-09 DIAGNOSIS — I10 Essential (primary) hypertension: Secondary | ICD-10-CM | POA: Insufficient documentation

## 2020-12-09 DIAGNOSIS — Z79899 Other long term (current) drug therapy: Secondary | ICD-10-CM | POA: Insufficient documentation

## 2020-12-09 DIAGNOSIS — F17210 Nicotine dependence, cigarettes, uncomplicated: Secondary | ICD-10-CM | POA: Insufficient documentation

## 2020-12-09 DIAGNOSIS — Z89511 Acquired absence of right leg below knee: Secondary | ICD-10-CM | POA: Insufficient documentation

## 2020-12-09 DIAGNOSIS — R4781 Slurred speech: Secondary | ICD-10-CM | POA: Diagnosis not present

## 2020-12-09 DIAGNOSIS — F10920 Alcohol use, unspecified with intoxication, uncomplicated: Secondary | ICD-10-CM

## 2020-12-09 DIAGNOSIS — F10120 Alcohol abuse with intoxication, uncomplicated: Secondary | ICD-10-CM | POA: Diagnosis present

## 2020-12-09 LAB — COMPREHENSIVE METABOLIC PANEL
ALT: 32 U/L (ref 0–44)
AST: 76 U/L — ABNORMAL HIGH (ref 15–41)
Albumin: 3.6 g/dL (ref 3.5–5.0)
Alkaline Phosphatase: 74 U/L (ref 38–126)
Anion gap: 10 (ref 5–15)
BUN: 6 mg/dL — ABNORMAL LOW (ref 8–23)
CO2: 25 mmol/L (ref 22–32)
Calcium: 8.7 mg/dL — ABNORMAL LOW (ref 8.9–10.3)
Chloride: 100 mmol/L (ref 98–111)
Creatinine, Ser: 0.72 mg/dL (ref 0.61–1.24)
GFR, Estimated: >60 mL/min (ref >60)
Glucose, Bld: 153 mg/dL — ABNORMAL HIGH (ref 70–99)
Potassium: 5.5 mmol/L — ABNORMAL HIGH (ref 3.5–5.1)
Sodium: 135 mmol/L (ref 135–145)
Total Bilirubin: 0.9 mg/dL (ref 0.3–1.2)
Total Protein: 7.3 g/dL (ref 6.5–8.1)

## 2020-12-09 LAB — CBC WITH DIFFERENTIAL/PLATELET
Abs Immature Granulocytes: 0 10*3/uL (ref 0.00–0.07)
Basophils Absolute: 0.1 10*3/uL (ref 0.0–0.1)
Basophils Relative: 1 %
Eosinophils Absolute: 0 10*3/uL (ref 0.0–0.5)
Eosinophils Relative: 1 %
HCT: 40.8 % (ref 39.0–52.0)
Hemoglobin: 13.7 g/dL (ref 13.0–17.0)
Immature Granulocytes: 0 %
Lymphocytes Relative: 28 %
Lymphs Abs: 1.7 10*3/uL (ref 0.7–4.0)
MCH: 30.4 pg (ref 26.0–34.0)
MCHC: 33.6 g/dL (ref 30.0–36.0)
MCV: 90.7 fL (ref 80.0–100.0)
Monocytes Absolute: 0.9 10*3/uL (ref 0.1–1.0)
Monocytes Relative: 15 %
Neutro Abs: 3.4 10*3/uL (ref 1.7–7.7)
Neutrophils Relative %: 55 %
Platelets: 233 10*3/uL (ref 150–400)
RBC: 4.5 MIL/uL (ref 4.22–5.81)
RDW: 15.3 % (ref 11.5–15.5)
WBC: 6.1 10*3/uL (ref 4.0–10.5)
nRBC: 0 % (ref 0.0–0.2)

## 2020-12-09 LAB — ETHANOL: Alcohol, Ethyl (B): 427 mg/dL (ref <10)

## 2020-12-09 MED ORDER — SODIUM CHLORIDE 0.9 % IV BOLUS
1000.0000 mL | Freq: Once | INTRAVENOUS | Status: AC
  Administered 2020-12-09: 1000 mL via INTRAVENOUS

## 2020-12-09 MED ORDER — THIAMINE HCL 100 MG PO TABS
100.0000 mg | ORAL_TABLET | Freq: Once | ORAL | Status: AC
  Administered 2020-12-09: 100 mg via ORAL
  Filled 2020-12-09: qty 1

## 2020-12-09 NOTE — ED Triage Notes (Signed)
Pt BIB [facility] for HTN. Pt is also an alcoholic, requesting detox. Per EMS, pt afraid of withdrawal seizures as this has happened before, has had seizures every time he tries to quit. Last drink earlier today per pt. Pt smells of urine. Pt unable to provide detailed information at this time. Per pt, usually drinks 4x 40s per day.

## 2020-12-09 NOTE — ED Provider Notes (Signed)
[facility] EMERGENCY DEPARTMENT Provider Note   CSN: [id number] Arrival date & time: 12/09/20  1608     History Chief Complaint  Patient presents with  . Hypertension    Dominic Carter is a 65 y.o. male.  HPI 65 year old male presents with concern for possible seizure.  History is hard to follow as he is acutely intoxicated.  Apparently when he woke up he did not feel right but he cannot tell me what that feeling was.  However he was worried he might have a seizure which he has had a couple times before.  He denies any other symptoms including no headache, chest pain or shortness of breath.  He does endorse drinking alcohol this morning, telling me he drank 4 large beers.  He drinks daily.  He is wanting to get detox from alcohol.  Denies suicidal thoughts.  Right now he does not feel like he is going to have a seizure and he feels pretty good.  Past Medical History:  Diagnosis Date  . DVT (deep venous thrombosis) (HCC)   . Dyslipidemia   . ETOH abuse   . Hx of BKA, right (HCC)   . Hypertension   . Seizure disorder [facility])     Patient Active Problem List   Diagnosis Date Noted  . 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 Dyslipidemia 01/30/2020  . Hepatitis, alcoholic, acute 01/30/2020  . Seizure (HCC) 01/20/2020  . ETOH abuse 01/20/2020  . HTN (hypertension) 01/20/2020  . 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 BELOW KNEE LEG AMPUTATION Right        Family History  Problem Relation Age of Onset  . Hypertension Mother   . Hypertension Sister   . Hypertension Brother   . Hypertension Maternal Grandmother   . Hypertension Maternal Grandfather     Social History   Tobacco Use  . Smoking status: Every Day    Packs/day: 1.00    Types: Cigarettes  . Smokeless tobacco: Never  Vaping Use  . Vaping Use: Never used  Substance Use Topics  . Alcohol use: Yes     Comment: @ least 40 oz beer/day  . Drug use: No    Home Medications Prior to Admission medications   Medication Sig Start Date End Date Taking? Authorizing Provider  amLODipine (NORVASC) 10 MG tablet Take 1 tablet (10 mg total) by mouth daily. 09/05/20   Karsten Ro, MD  aspirin EC 81 MG EC tablet Take 1 tablet (81 mg total) by mouth daily. Swallow whole. Patient not taking: Reported on 08/28/2020 01/27/20   Maretta Bees, MD  famotidine (PEPCID) 40 MG tablet Take 40 mg by mouth every evening. 08/08/20   [provider]  folic acid (FOLVITE) 1 MG tablet Take 1 tablet (1 mg total) by mouth daily. 09/06/20   Karsten Ro, MD  hydrocortisone cream 1 % Apply 1 application topically 2 (two) times daily. 09/05/20   Karsten Ro, MD  levETIRAcetam (KEPPRA) 500 MG tablet Take 1 tablet (500 mg total) by mouth 2 (two) times daily. 11/26/20   Tilden Fossa, MD  Multiple Vitamin (MULTIVITAMIN WITH MINERALS) TABS tablet Take 1 tablet by mouth daily. 09/05/20   Karsten Ro, MD  nicotine (NICODERM CQ - DOSED IN MG/24 HOURS) 21 mg/24hr patch Place 1 patch (21 mg total) onto the skin daily. Patient not taking: Reported on 08/28/2020 02/09/20   Medina-Vargas, [employer]  C, NP  pravastatin (PRAVACHOL) 20 MG tablet Take 1 tablet (20 mg total) by mouth daily. 09/06/20   Karsten Ro, MD  thiamine 100 MG tablet Take 1 tablet (100 mg total) by mouth daily. 09/06/20   Karsten Ro, MD    Allergies    Patient has no known allergies.  Review of Systems   Review of Systems  Constitutional:  Negative for fever.  Respiratory:  Negative for shortness of breath.   Cardiovascular:  Negative for chest pain.  Gastrointestinal:  Negative for abdominal pain.  Neurological:  Negative for headaches.  All other systems reviewed and are negative.  Physical Exam Updated Vital Signs BP 132/87   Pulse 99   Temp 98.2 F (36.8 C) (Oral)   Resp (!) 29   Ht 5\' 11"  (1.803 m)   Wt 79.4 kg   SpO2 94%   BMI 24.41 kg/m    Physical Exam Vitals and nursing note reviewed.  Constitutional:      Appearance: He is well-developed.     Comments: Acutely intoxicated  HENT:     Head: Normocephalic and atraumatic.     Right Ear: External ear normal.     Left Ear: External ear normal.     Nose: Nose normal.  Eyes:     General:        Right eye: No discharge.        Left eye: No discharge.  Cardiovascular:     Rate and Rhythm: Regular rhythm. Tachycardia present.     Heart sounds: Normal heart sounds.     Comments: HR~100 Pulmonary:     Effort: Pulmonary effort is normal.     Breath sounds: Normal breath sounds.  Abdominal:     General: There is no distension.     Palpations: Abdomen is soft.     Tenderness: There is no abdominal tenderness.  Musculoskeletal:     Cervical back: Neck supple.     Comments: Right BKA  Skin:    General: Skin is warm and dry.  Neurological:     Mental Status: He is alert.     Comments: CN 3-12 grossly intact. 5/5 strength in all 4 extremities. Grossly normal sensation. Mild difficulty with finger to nose bilaterally. Alert and oriented  Psychiatric:        Mood and Affect: Mood is not anxious.        Speech: Speech is slurred.        Thought Content: Thought content does not include suicidal ideation.    ED Results / Procedures / Treatments   Labs (all labs ordered are listed, but only abnormal results are displayed) Labs Reviewed  COMPREHENSIVE METABOLIC PANEL - Abnormal; Notable for the following components:      Result Value   Potassium 5.5 (*)    Glucose, Bld 153 (*)    BUN 6 (*)    Calcium 8.7 (*)    AST 76 (*)    All other components within normal limits  ETHANOL - Abnormal; Notable for the following components:   Alcohol, Ethyl (B) 427 (*)    All other components within normal limits  CBC WITH DIFFERENTIAL/PLATELET    EKG EKG Interpretation  Date/Time:  Sunday December 09 2020 16:23:00 EDT Ventricular Rate:  98 PR Interval:  186 QRS  Duration: 87 QT Interval:  362 QTC Calculation: 463 R Axis:   27 Text Interpretation: Sinus rhythm Abnormal R-wave progression, early transition Minimal ST depression, lateral leads similar to Aug 2022 Confirmed  by Pricilla Loveless [phone]) on 12/09/2020 4:24:55 PM  Radiology No results found.  Procedures Procedures   Medications Ordered in ED Medications  sodium chloride 0.9 % bolus 1,000 mL (0 mLs Intravenous Stopped 12/09/20 2241)  thiamine tablet 100 mg (100 mg Oral Given 12/09/20 1717)    ED Course  I have reviewed the triage vital signs and the nursing notes.  Pertinent labs & imaging results that were available during my care of the patient were reviewed by me and considered in my medical decision making (see chart for details).    MDM Rules/Calculators/A&P                           Patient was acutely intoxicated on arrival.  He has significantly improved.  Labs are unremarkable besides the alcohol level and mild hyperkalemia.  He was given IV fluids and has no acute ECG changes.  I will have him stop the lisinopril.  He is no longer intoxicated and able to get a ride home.  No other complaints besides wanting detox.  He can get this done as an outpatient.  No acute psychosis or suicidal thoughts. Final Clinical Impression(s) / ED Diagnoses Final diagnoses:  Alcoholic intoxication without complication (HCC)  Hyperkalemia    Rx / DC Orders ED Discharge Orders     None        Pricilla Loveless, MD 12/09/20 2337

## 2020-12-09 NOTE — Discharge Instructions (Addendum)
Your potassium was mildly elevated today.  Do not take your lisinopril and get your numbers rechecked by your primary doctor next week.

## 2020-12-09 NOTE — ED Notes (Signed)
Spoke w/pt's family, [name]. She will be picking up from lobby. Relayed discharge instructions to family. Pt to lobby to wait.

## 2021-07-12 ENCOUNTER — Encounter (HOSPITAL_COMMUNITY): Payer: Self-pay | Admitting: Pharmacy Technician

## 2021-07-12 ENCOUNTER — Other Ambulatory Visit: Payer: Self-pay

## 2021-07-12 ENCOUNTER — Emergency Department (HOSPITAL_COMMUNITY)
Admission: EM | Admit: 2021-07-12 | Discharge: 2021-07-12 | Disposition: A | Discharge status: Home / Self Care | Payer: Medicare Other | Attending: Emergency Medicine | Admitting: Emergency Medicine

## 2021-07-12 DIAGNOSIS — Z7982 Long term (current) use of aspirin: Secondary | ICD-10-CM | POA: Diagnosis not present

## 2021-07-12 DIAGNOSIS — I10 Essential (primary) hypertension: Secondary | ICD-10-CM

## 2021-07-12 DIAGNOSIS — L239 Allergic contact dermatitis, unspecified cause: Secondary | ICD-10-CM | POA: Diagnosis not present

## 2021-07-12 DIAGNOSIS — Z79899 Other long term (current) drug therapy: Secondary | ICD-10-CM | POA: Diagnosis not present

## 2021-07-12 DIAGNOSIS — L259 Unspecified contact dermatitis, unspecified cause: Secondary | ICD-10-CM

## 2021-07-12 DIAGNOSIS — R220 Localized swelling, mass and lump, head: Secondary | ICD-10-CM | POA: Diagnosis present

## 2021-07-12 MED ORDER — PREDNISONE 10 MG PO TABS: 20.0000 mg | ORAL_TABLET | Freq: Every day | ORAL | 0 refills | Status: AC

## 2021-07-12 MED ORDER — AMLODIPINE BESYLATE 10 MG PO TABS: 10.0000 mg | ORAL_TABLET | Freq: Every day | ORAL | 0 refills | Status: DC

## 2021-07-12 MED ORDER — AMLODIPINE BESYLATE 5 MG PO TABS
10.0000 mg | ORAL_TABLET | Freq: Once | ORAL | Status: AC
  Administered 2021-07-12: 10 mg via ORAL
  Filled 2021-07-12: qty 2

## 2021-07-12 NOTE — ED Provider Triage Note (Signed)
Emergency Medicine Provider Triage Evaluation Note ? ?Estill Bakes , a 66 y.o. male  was evaluated in triage.  Pt complains of facial swelling for the past 5 days gradually. The swelling is in the periorbital region. The patient denies any pain or itching. Denies any trauma. Denies any lip, tongue, or throat swelling. Denies any new medications or foods. He has not taken any of his daily medications in a couple of months- around 6 months. Denies any chest pain or SOB. ? ?Review of Systems  ?Positive:  ?Negative:  ? ?Physical Exam  ?BP (!) 188/117 (BP Location: Right Arm)   Pulse (!) 117   Temp 99 ?F (37.2 ?C)   Resp 16   SpO2 98%  ?Gen:   Awake, no distress   ?Resp:  Normal effort  ?MSK:   Moves extremities without difficulty  ?Other:  Bilateral periorbital swelling. Mildly tachycardic at 106. ? ?Medical Decision Making  ?Medically screening exam initiated at 4:18 PM.  Appropriate orders placed.  Joram Sherrilee Gilles was informed that the remainder of the evaluation will be completed by another provider, this initial triage assessment does not replace that evaluation, and the importance of remaining in the ED until their evaluation is complete. ? ?NAD. CTAB. Will order basic labs and his home medication amlodopine ?  ?Sherrell Puller, PA-C ?07/12/21 1623 ? ?

## 2021-07-12 NOTE — Discharge Instructions (Signed)
Please continue Benadryl 25 mg every 6 hours until symptoms resolve ?Use a nonallergenic skin wash ?Take the prednisone as prescribed ?Please have your Norvasc refilled and have your blood pressure rechecked next week with your doctor ?Return if you are having any increased swelling, difficulty speaking or breathing, or other new symptoms ?

## 2021-07-12 NOTE — ED Notes (Signed)
Family at bedside requesting doctor come and look at pt head. Pt did not voice any issue with his head when doctor was previously in for assessment nor to this nurse. ?

## 2021-07-12 NOTE — ED Provider Notes (Signed)
?[place] ?Provider Note ? ? ?CSN: [id number] ?Arrival date & time: 07/12/21  1535 ? ?  ? ?History ? ?Chief Complaint  ?Patient presents with  ? Facial Swelling  ? ? ?Dominic Carter is a 66 y.o. male. ? ?HPI ?66 year old male presents today complaining of allergic reaction.  He states that he is having some swelling to his face began earlier this week.  He has been taking Benadryl.  He has not had any mouth swelling, difficulty breathing, shortness of breath, lightheadedness, or GI symptoms.  Has not had any changes medication.  In fact he has not been taking any of his prescription medicines.  He has been taking Benadryl over-the-counter with some relief.  He has not had any similar symptoms in the past except to seasonal allergens.  Symptoms are mainly some redness and swelling in his face under his left eye and around his right eye. ? ?  ? ?Home Medications ?Prior to Admission medications   ?Medication Sig Start Date End Date Taking? Authorizing Provider  ?predniSONE (DELTASONE) 10 MG tablet Take 2 tablets (20 mg total) by mouth daily for 5 days. 07/12/21 07/17/21 Yes [name], MD  ?amLODipine (NORVASC) 10 MG tablet Take 1 tablet (10 mg total) by mouth daily. 07/12/21   [name], MD  ?aspirin EC 81 MG EC tablet Take 1 tablet (81 mg total) by mouth daily. Swallow whole. ?Patient not taking: Reported on 08/28/2020 01/27/20   Jonetta Osgood, MD  ?famotidine (PEPCID) 40 MG tablet Take 40 mg by mouth every evening. 08/08/20   [provider]  ?folic acid (FOLVITE) 1 MG tablet Take 1 tablet (1 mg total) by mouth daily. 09/06/20   Armando Reichert, MD  ?hydrocortisone cream 1 % Apply 1 application topically 2 (two) times daily. 09/05/20   Armando Reichert, MD  ?levETIRAcetam (KEPPRA) 500 MG tablet Take 1 tablet (500 mg total) by mouth 2 (two) times daily. 11/26/20   Quintella Reichert, MD  ?Multiple Vitamin (MULTIVITAMIN WITH MINERALS) TABS tablet Take 1 tablet by mouth daily.  09/05/20   Armando Reichert, MD  ?nicotine (NICODERM CQ - DOSED IN MG/24 HOURS) 21 mg/24hr patch Place 1 patch (21 mg total) onto the skin daily. ?Patient not taking: Reported on 08/28/2020 02/09/20   Medina-Vargas, Monina C, NP  ?pravastatin (PRAVACHOL) 20 MG tablet Take 1 tablet (20 mg total) by mouth daily. 09/06/20   Armando Reichert, MD  ?thiamine 100 MG tablet Take 1 tablet (100 mg total) by mouth daily. 09/06/20   Armando Reichert, MD  ?   ? ?Allergies    ?Patient has no known allergies.   ? ?Review of Systems   ?Review of Systems  ?All other systems reviewed and are negative. ? ?Physical Exam ?Updated Vital Signs ?BP (!) 186/96   Pulse 94   Temp 99 ?F (37.2 ?C)   Resp 18   SpO2 99%  ?Physical Exam ?Vitals and nursing note reviewed.  ?Constitutional:   ?   Appearance: He is well-developed.  ?HENT:  ?   Head: Normocephalic and atraumatic.  ?   Right Ear: External ear normal.  ?   Left Ear: External ear normal.  ?   Nose: Nose normal.  ?Eyes:  ?   Conjunctiva/sclera: Conjunctivae normal.  ?   Pupils: Pupils are equal, round, and reactive to light.  ?   Comments: Mild periorbital swelling on the left with some erythema on right no eye injection noted  ?Cardiovascular:  ?  Rate and Rhythm: Normal rate and regular rhythm.  ?   Heart sounds: Normal heart sounds.  ?Pulmonary:  ?   Effort: Pulmonary effort is normal.  ?   Breath sounds: Normal breath sounds.  ?Abdominal:  ?   General: Bowel sounds are normal.  ?   Palpations: Abdomen is soft.  ?Musculoskeletal:     ?   General: Normal range of motion.  ?   Cervical back: Normal range of motion and neck supple.  ?Skin: ?   General: Skin is warm and dry.  ?Neurological:  ?   Mental Status: He is alert and oriented to person, place, and time.  ?   Deep Tendon Reflexes: Reflexes are normal and symmetric.  ?Psychiatric:     ?   Behavior: Behavior normal.     ?   Thought Content: Thought content normal.     ?   Judgment: Judgment normal.  ? ? ?ED Results / Procedures / Treatments    ?Labs ?(all labs ordered are listed, but only abnormal results are displayed) ?Labs Reviewed - No data to display ? ?EKG ?None ? ?Radiology ?No results found. ? ?Procedures ?Procedures  ? ? ?Medications Ordered in ED ?Medications  ?amLODipine (NORVASC) tablet 10 mg (10 mg Oral Given 07/12/21 1631)  ? ? ?ED Course/ Medical Decision Making/ A&P ?  ?                        ?Medical Decision Making ?66 year old man presents today with some facial itching, redness, and mild swelling.  He has been taking Benadryl.  Symptoms most consistent with allergic reaction.  He has not been on his medication so doubt acute medication reaction.  But consistent with contact dermatitis.  He has not noted any new things that he has been taking.  He has been outdoors some.  Plan we will add steroids.  We will continue Benadryl.  Patient advised regarding return precautions and voices understanding.  I will also refill his Norvasc as he has been out of this and significantly hypertensive.  He is advised regarding his need for follow-up or having his all of his medications refilled. ? ? ? ? ? ? ? ? ? ? ?Final Clinical Impression(s) / ED Diagnoses ?Final diagnoses:  ?Contact dermatitis, unspecified contact dermatitis type, unspecified trigger  ? ? ?Rx / DC Orders ?ED Discharge Orders   ? ?      Ordered  ?  predniSONE (DELTASONE) 10 MG tablet  Daily       ? 07/12/21 1833  ?  amLODipine (NORVASC) 10 MG tablet  Daily       ? 07/12/21 1834  ? ?  ?  ? ?  ? ? ?  ?[name], MD ?07/12/21 1834 ? ?

## 2021-07-12 NOTE — ED Triage Notes (Signed)
Pt here POV with reports of facial swelling since Monday. Denies known allergies. Pt has been taking benadryl at home. No new soaps.  ?

## 2021-08-10 ENCOUNTER — Emergency Department (HOSPITAL_COMMUNITY)
Admission: EM | Admit: 2021-08-10 | Discharge: 2021-08-10 | Disposition: A | Discharge status: Home / Self Care | Payer: Medicare Other | Attending: Emergency Medicine | Admitting: Emergency Medicine

## 2021-08-10 ENCOUNTER — Encounter (HOSPITAL_COMMUNITY): Payer: Self-pay

## 2021-08-10 ENCOUNTER — Other Ambulatory Visit: Payer: Self-pay

## 2021-08-10 DIAGNOSIS — Z4801 Encounter for change or removal of surgical wound dressing: Secondary | ICD-10-CM | POA: Insufficient documentation

## 2021-08-10 DIAGNOSIS — S0990XA Unspecified injury of head, initial encounter: Secondary | ICD-10-CM | POA: Diagnosis present

## 2021-08-10 DIAGNOSIS — B999 Unspecified infectious disease: Secondary | ICD-10-CM | POA: Insufficient documentation

## 2021-08-10 DIAGNOSIS — W19XXXA Unspecified fall, initial encounter: Secondary | ICD-10-CM | POA: Diagnosis not present

## 2021-08-10 DIAGNOSIS — L089 Local infection of the skin and subcutaneous tissue, unspecified: Secondary | ICD-10-CM

## 2021-08-10 DIAGNOSIS — S0101XA Laceration without foreign body of scalp, initial encounter: Secondary | ICD-10-CM | POA: Diagnosis not present

## 2021-08-10 DIAGNOSIS — Z7982 Long term (current) use of aspirin: Secondary | ICD-10-CM | POA: Insufficient documentation

## 2021-08-10 DIAGNOSIS — Z79899 Other long term (current) drug therapy: Secondary | ICD-10-CM | POA: Diagnosis not present

## 2021-08-10 MED ORDER — SULFAMETHOXAZOLE-TRIMETHOPRIM 800-160 MG PO TABS
1.0000 | ORAL_TABLET | Freq: Once | ORAL | Status: AC
  Administered 2021-08-10: 1 via ORAL
  Filled 2021-08-10: qty 1

## 2021-08-10 MED ORDER — SULFAMETHOXAZOLE-TRIMETHOPRIM 800-160 MG PO TABS: 1.0000 | ORAL_TABLET | Freq: Two times a day (BID) | ORAL | 0 refills | Status: DC

## 2021-08-10 NOTE — ED Triage Notes (Signed)
Pt has wound to scalp that he has had for 2 weeks. And tonight it started hurting and itching. Pt states he got the wound from a fall a couple of weeks ago. Pt would like wound to be checked ?

## 2021-08-10 NOTE — ED Triage Notes (Signed)
Pt picks at wound and wound has purulent drainage ? ?

## 2021-08-10 NOTE — Discharge Instructions (Addendum)
Take antibiotic as directed.  See your Physician for recheck on wound in 3-4 days  ?

## 2021-08-10 NOTE — ED Provider Notes (Signed)
?[facility] ?Provider Note ? ? ?CSN: [id number] ?Arrival date & time: 08/10/21  0344 ? ?  ? ?History ? ?Chief Complaint  ?Patient presents with  ? Wound Check  ? ? ?Dominic Carter is a 66 y.o. male. ? ?Pt reports he injured his scalp.  Pt reports he now has some oozing and infection.  Pt unsure of when he injured.  Pt reports he fell and cut scalp.  Pt has a history of alcohol abuse  ? ?The history is provided by the patient. No language interpreter was used.  ?Wound Check ?This is a new problem. Episode onset: weeks. The problem occurs constantly. The problem has been gradually worsening. Nothing aggravates the symptoms. Nothing relieves the symptoms. He has tried nothing for the symptoms. The treatment provided no relief.  ? ?  ? ?Home Medications ?Prior to Admission medications   ?Medication Sig Start Date End Date Taking? Authorizing Provider  ?amLODipine (NORVASC) 10 MG tablet Take 1 tablet (10 mg total) by mouth daily. 07/12/21   [name], MD  ?aspirin EC 81 MG EC tablet Take 1 tablet (81 mg total) by mouth daily. Swallow whole. ?Patient not taking: Reported on 08/28/2020 01/27/20   Jonetta Osgood, MD  ?famotidine (PEPCID) 40 MG tablet Take 40 mg by mouth every evening. 08/08/20   [provider]  ?folic acid (FOLVITE) 1 MG tablet Take 1 tablet (1 mg total) by mouth daily. 09/06/20   Armando Reichert, MD  ?hydrocortisone cream 1 % Apply 1 application topically 2 (two) times daily. 09/05/20   Armando Reichert, MD  ?levETIRAcetam (KEPPRA) 500 MG tablet Take 1 tablet (500 mg total) by mouth 2 (two) times daily. 11/26/20   Quintella Reichert, MD  ?Multiple Vitamin (MULTIVITAMIN WITH MINERALS) TABS tablet Take 1 tablet by mouth daily. 09/05/20   Armando Reichert, MD  ?nicotine (NICODERM CQ - DOSED IN MG/24 HOURS) 21 mg/24hr patch Place 1 patch (21 mg total) onto the skin daily. ?Patient not taking: Reported on 08/28/2020 02/09/20   Medina-Vargas, Monina C, NP  ?pravastatin  (PRAVACHOL) 20 MG tablet Take 1 tablet (20 mg total) by mouth daily. 09/06/20   Armando Reichert, MD  ?thiamine 100 MG tablet Take 1 tablet (100 mg total) by mouth daily. 09/06/20   Armando Reichert, MD  ?   ? ?Allergies    ?Patient has no known allergies.   ? ?Review of Systems   ?Review of Systems  ?All other systems reviewed and are negative. ? ?Physical Exam ?Updated Vital Signs ?BP 130/80 (BP Location: Left Arm)   Pulse 84   Temp (!) 97.5 ?F (36.4 ?C)   Resp 16   Ht 5\' 11"  (1.803 m)   Wt 79.2 kg   SpO2 97%   BMI 24.35 kg/m?  ?Physical Exam ?Vitals reviewed.  ?Constitutional:   ?   Appearance: Normal appearance.  ?Eyes:  ?   Pupils: Pupils are equal, round, and reactive to light.  ?Cardiovascular:  ?   Rate and Rhythm: Normal rate.  ?   Pulses: Normal pulses.  ?Pulmonary:  ?   Effort: Pulmonary effort is normal.  ?Musculoskeletal:     ?   General: Normal range of motion.  ?Skin: ?   Comments: 10x5 cm area scabbed,  oozing from sides    ?Neurological:  ?   General: No focal deficit present.  ?   Mental Status: He is alert.  ?Psychiatric:     ?   Mood and Affect: Mood  normal.  ? ? ?ED Results / Procedures / Treatments   ?Labs ?(all labs ordered are listed, but only abnormal results are displayed) ?Labs Reviewed - No data to display ? ?EKG ?None ? ?Radiology ?No results found. ? ?Procedures ?Procedures  ? ? ?Medications Ordered in ED ?Medications  ?sulfamethoxazole-trimethoprim (BACTRIM DS) 800-160 MG per tablet 1 tablet (has no administration in time range)  ? ? ?ED Course/ Medical Decision Making/ A&P ?  ?                        ?Medical Decision Making ?Risk ?Prescription drug management. ? ? ?MDM:  Pt advised to clean area twice a day.  Pt given rx for Bactrim .  Pt advised to recheck with his primary care in 1 week  ? ? ? ? ? ? ? ?Final Clinical Impression(s) / ED Diagnoses ?Final diagnoses:  ?Wound infection  ?Scalp laceration, initial encounter  ? ? ?Rx / DC Orders ?ED Discharge Orders   ? ? None  ? ?  ? ?An  After Visit Summary was printed and given to the patient.  ?  ?Fransico Meadow, PA-C ?08/10/21 1255 ? ?  ?Wyvonnia Dusky, MD ?08/10/21 1257 ? ?

## 2021-09-01 ENCOUNTER — Encounter (HOSPITAL_COMMUNITY): Payer: Self-pay

## 2021-09-01 ENCOUNTER — Other Ambulatory Visit: Payer: Self-pay

## 2021-09-01 ENCOUNTER — Inpatient Hospital Stay (HOSPITAL_COMMUNITY)
Admission: EM | Admit: 2021-09-01 | Discharge: 2021-09-07 | DRG: 101 | Disposition: A | Discharge status: Skilled Nursing Facility | Payer: Medicare Other | Attending: Internal Medicine | Admitting: Internal Medicine

## 2021-09-01 ENCOUNTER — Emergency Department (HOSPITAL_COMMUNITY): Payer: Medicare Other

## 2021-09-01 DIAGNOSIS — Z91018 Allergy to other foods: Secondary | ICD-10-CM

## 2021-09-01 DIAGNOSIS — R569 Unspecified convulsions: Secondary | ICD-10-CM | POA: Diagnosis not present

## 2021-09-01 DIAGNOSIS — Z89511 Acquired absence of right leg below knee: Secondary | ICD-10-CM

## 2021-09-01 DIAGNOSIS — W19XXXA Unspecified fall, initial encounter: Secondary | ICD-10-CM | POA: Diagnosis not present

## 2021-09-01 DIAGNOSIS — Y906 Blood alcohol level of 120-199 mg/100 ml: Secondary | ICD-10-CM | POA: Diagnosis present

## 2021-09-01 DIAGNOSIS — F1010 Alcohol abuse, uncomplicated: Secondary | ICD-10-CM | POA: Diagnosis present

## 2021-09-01 DIAGNOSIS — F17210 Nicotine dependence, cigarettes, uncomplicated: Secondary | ICD-10-CM | POA: Diagnosis present

## 2021-09-01 DIAGNOSIS — E785 Hyperlipidemia, unspecified: Secondary | ICD-10-CM | POA: Diagnosis present

## 2021-09-01 DIAGNOSIS — I10 Essential (primary) hypertension: Secondary | ICD-10-CM | POA: Diagnosis not present

## 2021-09-01 DIAGNOSIS — Z91148 Patient's other noncompliance with medication regimen for other reason: Secondary | ICD-10-CM

## 2021-09-01 DIAGNOSIS — G4089 Other seizures: Principal | ICD-10-CM | POA: Diagnosis present

## 2021-09-01 DIAGNOSIS — G40909 Epilepsy, unspecified, not intractable, without status epilepticus: Secondary | ICD-10-CM | POA: Diagnosis present

## 2021-09-01 DIAGNOSIS — F10131 Alcohol abuse with withdrawal delirium: Secondary | ICD-10-CM | POA: Diagnosis present

## 2021-09-01 DIAGNOSIS — Z79899 Other long term (current) drug therapy: Secondary | ICD-10-CM

## 2021-09-01 DIAGNOSIS — E876 Hypokalemia: Secondary | ICD-10-CM | POA: Diagnosis not present

## 2021-09-01 DIAGNOSIS — Z91048 Other nonmedicinal substance allergy status: Secondary | ICD-10-CM

## 2021-09-01 DIAGNOSIS — R7401 Elevation of levels of liver transaminase levels: Secondary | ICD-10-CM | POA: Diagnosis present

## 2021-09-01 DIAGNOSIS — Z8673 Personal history of transient ischemic attack (TIA), and cerebral infarction without residual deficits: Secondary | ICD-10-CM

## 2021-09-01 DIAGNOSIS — S0100XA Unspecified open wound of scalp, initial encounter: Secondary | ICD-10-CM | POA: Diagnosis present

## 2021-09-01 DIAGNOSIS — Y92239 Unspecified place in hospital as the place of occurrence of the external cause: Secondary | ICD-10-CM | POA: Diagnosis not present

## 2021-09-01 DIAGNOSIS — Z720 Tobacco use: Secondary | ICD-10-CM | POA: Diagnosis present

## 2021-09-01 LAB — CBC WITH DIFFERENTIAL/PLATELET
Abs Immature Granulocytes: 0.02 10*3/uL (ref 0.00–0.07)
Basophils Absolute: 0.1 10*3/uL (ref 0.0–0.1)
Basophils Relative: 1 %
Eosinophils Absolute: 0 10*3/uL (ref 0.0–0.5)
Eosinophils Relative: 1 %
HCT: 38.4 % — ABNORMAL LOW (ref 39.0–52.0)
Hemoglobin: 12.4 g/dL — ABNORMAL LOW (ref 13.0–17.0)
Immature Granulocytes: 0 %
Lymphocytes Relative: 17 %
Lymphs Abs: 0.8 10*3/uL (ref 0.7–4.0)
MCH: 28.9 pg (ref 26.0–34.0)
MCHC: 32.3 g/dL (ref 30.0–36.0)
MCV: 89.5 fL (ref 80.0–100.0)
Monocytes Absolute: 0.8 10*3/uL (ref 0.1–1.0)
Monocytes Relative: 16 %
Neutro Abs: 3.1 10*3/uL (ref 1.7–7.7)
Neutrophils Relative %: 65 %
Platelets: 153 10*3/uL (ref 150–400)
RBC: 4.29 MIL/uL (ref 4.22–5.81)
RDW: 17.4 % — ABNORMAL HIGH (ref 11.5–15.5)
WBC: 4.8 10*3/uL (ref 4.0–10.5)
nRBC: 0 % (ref 0.0–0.2)

## 2021-09-01 LAB — I-STAT VENOUS BLOOD GAS, ED
Acid-Base Excess: 2 mmol/L (ref 0.0–2.0)
Bicarbonate: 25.6 mmol/L (ref 20.0–28.0)
Calcium, Ion: 1.02 mmol/L — ABNORMAL LOW (ref 1.15–1.40)
HCT: 38 % — ABNORMAL LOW (ref 39.0–52.0)
Hemoglobin: 12.9 g/dL — ABNORMAL LOW (ref 13.0–17.0)
O2 Saturation: 89 %
Potassium: 3.9 mmol/L (ref 3.5–5.1)
Sodium: 143 mmol/L (ref 135–145)
TCO2: 27 mmol/L (ref 22–32)
pCO2, Ven: 37.7 mmHg — ABNORMAL LOW (ref 44–60)
pH, Ven: 7.44 — ABNORMAL HIGH (ref 7.25–7.43)
pO2, Ven: 54 mmHg — ABNORMAL HIGH (ref 32–45)

## 2021-09-01 LAB — COMPREHENSIVE METABOLIC PANEL
ALT: 31 U/L (ref 0–44)
AST: 57 U/L — ABNORMAL HIGH (ref 15–41)
Albumin: 3.4 g/dL — ABNORMAL LOW (ref 3.5–5.0)
Alkaline Phosphatase: 73 U/L (ref 38–126)
Anion gap: 15 (ref 5–15)
BUN: 5 mg/dL — ABNORMAL LOW (ref 8–23)
CO2: 20 mmol/L — ABNORMAL LOW (ref 22–32)
Calcium: 8.4 mg/dL — ABNORMAL LOW (ref 8.9–10.3)
Chloride: 107 mmol/L (ref 98–111)
Creatinine, Ser: 1 mg/dL (ref 0.61–1.24)
GFR, Estimated: >60 mL/min (ref >60)
Glucose, Bld: 115 mg/dL — ABNORMAL HIGH (ref 70–99)
Potassium: 3.8 mmol/L (ref 3.5–5.1)
Sodium: 142 mmol/L (ref 135–145)
Total Bilirubin: 0.4 mg/dL (ref 0.3–1.2)
Total Protein: 7 g/dL (ref 6.5–8.1)

## 2021-09-01 LAB — ETHANOL: Alcohol, Ethyl (B): 135 mg/dL — ABNORMAL HIGH (ref <10)

## 2021-09-01 LAB — LIPASE, BLOOD: Lipase: 42 U/L (ref 11–51)

## 2021-09-01 LAB — AMMONIA: Ammonia: 30 umol/L (ref 9–35)

## 2021-09-01 LAB — HIV ANTIBODY (ROUTINE TESTING W REFLEX): HIV Screen 4th Generation wRfx: NONREACTIVE

## 2021-09-01 IMAGING — CT CT HEAD W/O CM
4 series · 15 of 47 positions shown, 17 images · non-contrast
Comparison: CT head [DATE].

CLINICAL DATA: Polytrauma, blunt



[Series 2: head wo · axial · 0.45mm/px · z∈[+1612,+1732]mm · 7 of 33 slices shown, 9 images]
[im 5/33  brain]
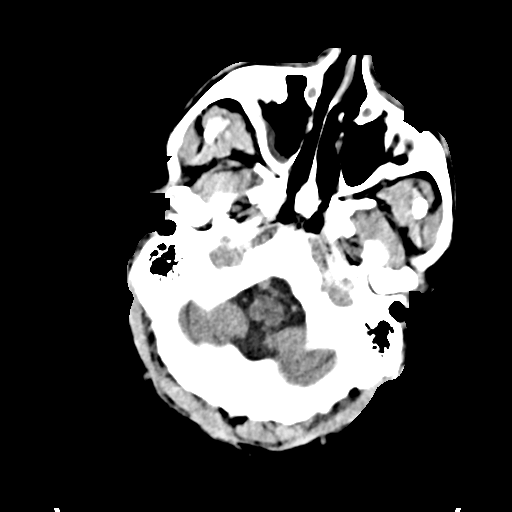
[im 5/33  bone]
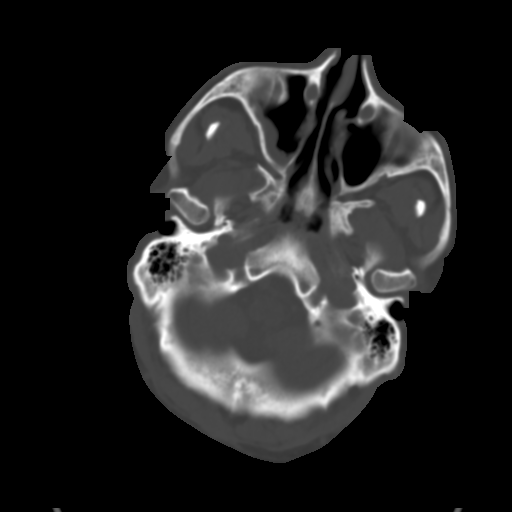
[im 9/33  brain]
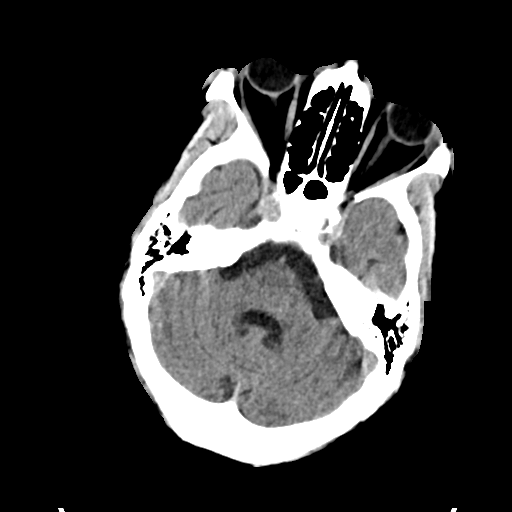
[im 13/33  brain]
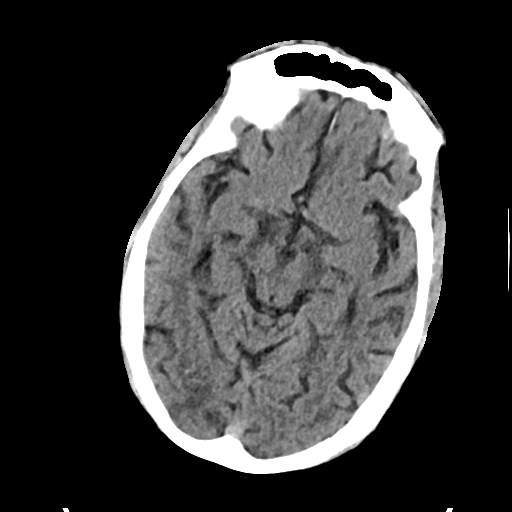
[im 17/33  brain]
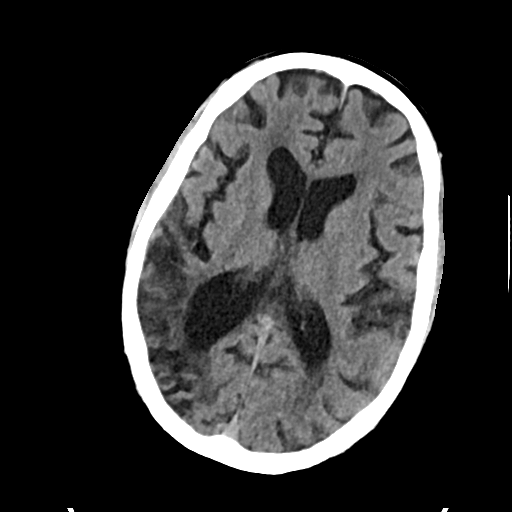
[im 21/33  brain]
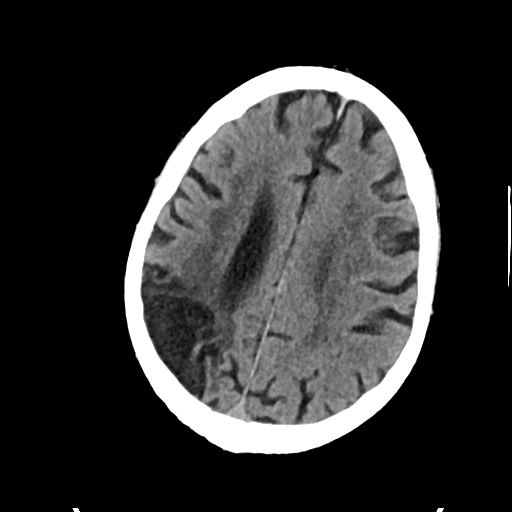
[im 21/33  bone]
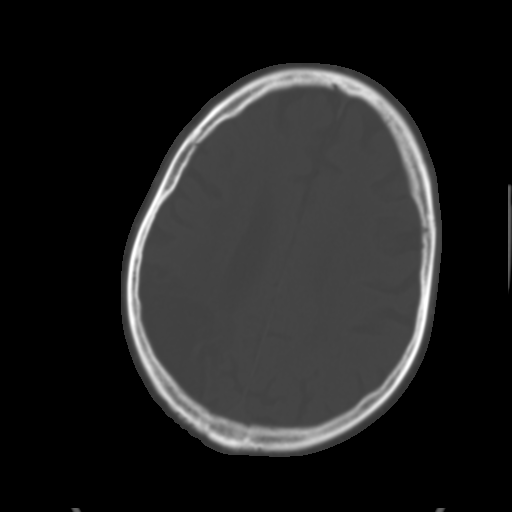
[im 25/33  brain]
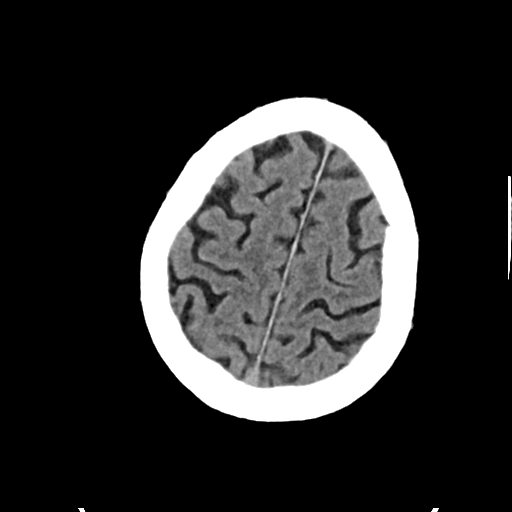
[im 29/33  brain]
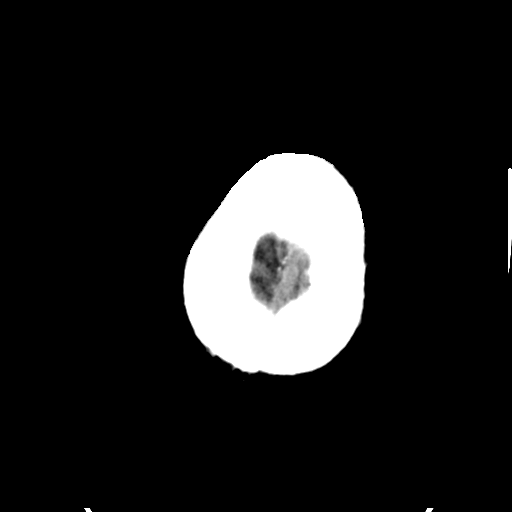

[Series 3: head bone · axial · 0.45mm/px · z∈[+1608,+1624]mm · 2 of 81 slices shown]
[im 9/81  bone]
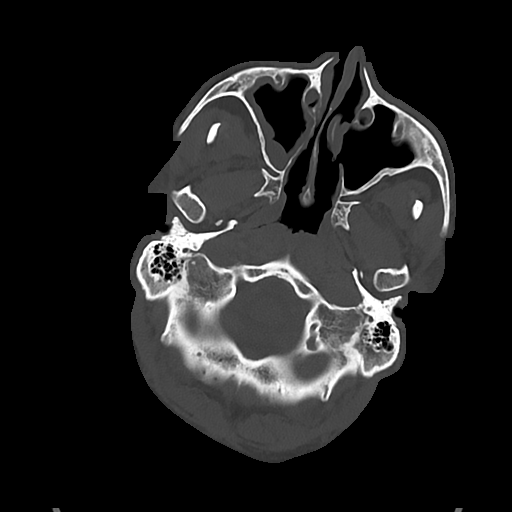
[im 17/81  bone]
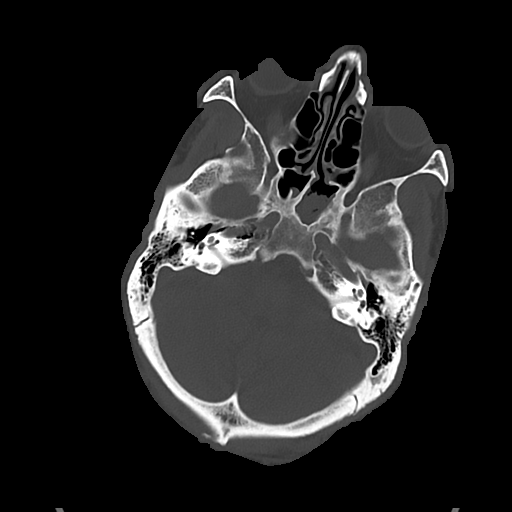

[Series 4: cor soft · coronal · 0.31mm/px · 3 of 74 slices shown]
[im 25/74  brain]
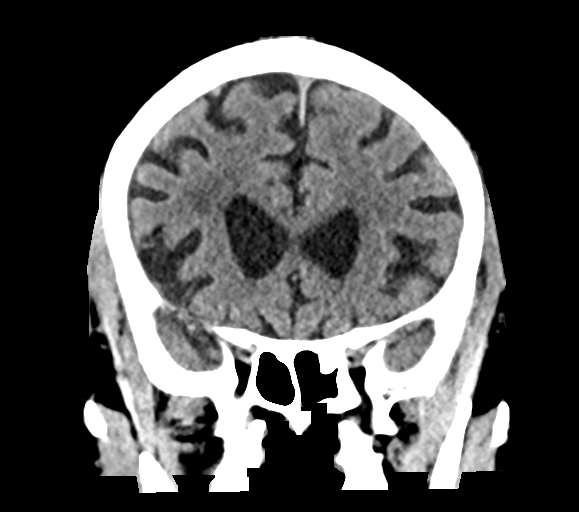
[im 33/74  brain]
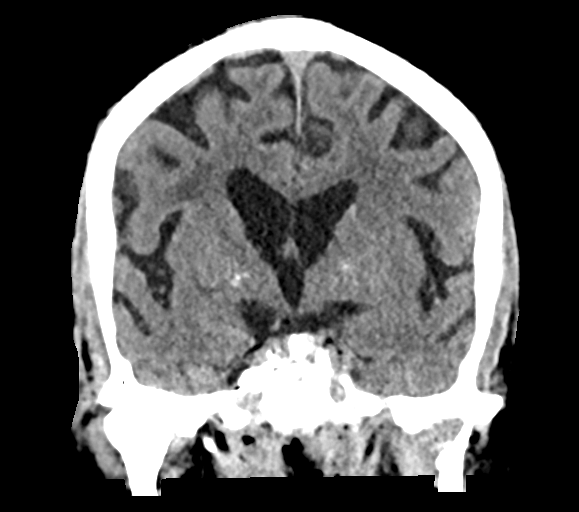
[im 41/74  brain]
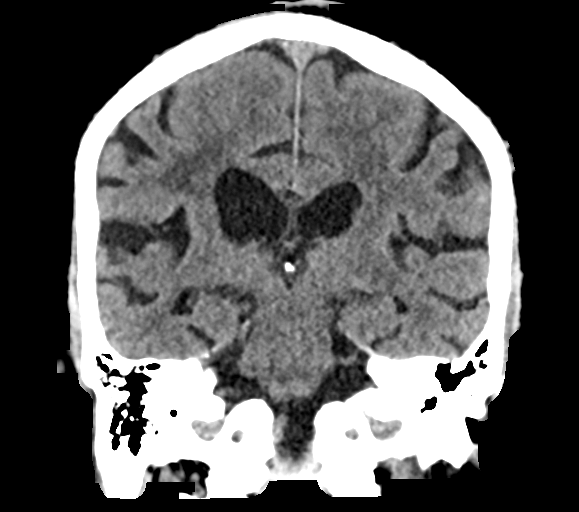

[Series 5: sag soft · sagittal · 0.31mm/px · 3 of 60 slices shown]
[im 20/60  brain]
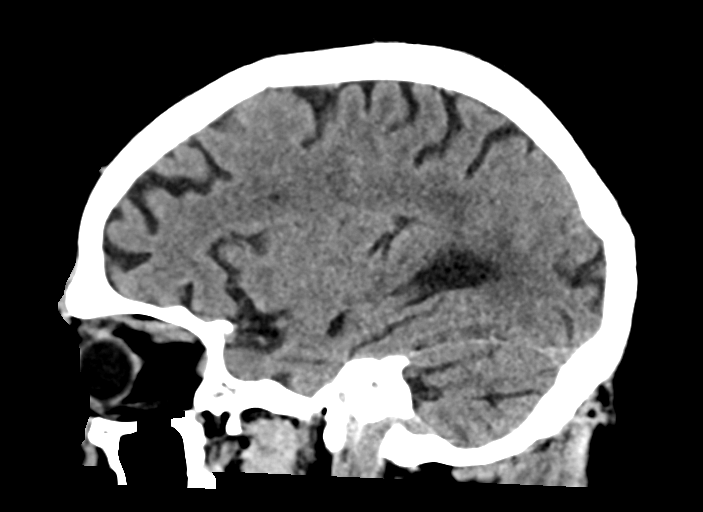
[im 30/60  brain]
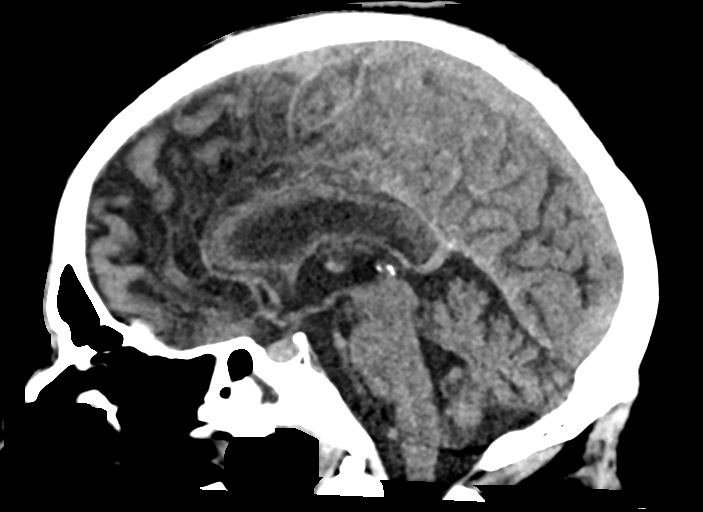
[im 40/60  brain]
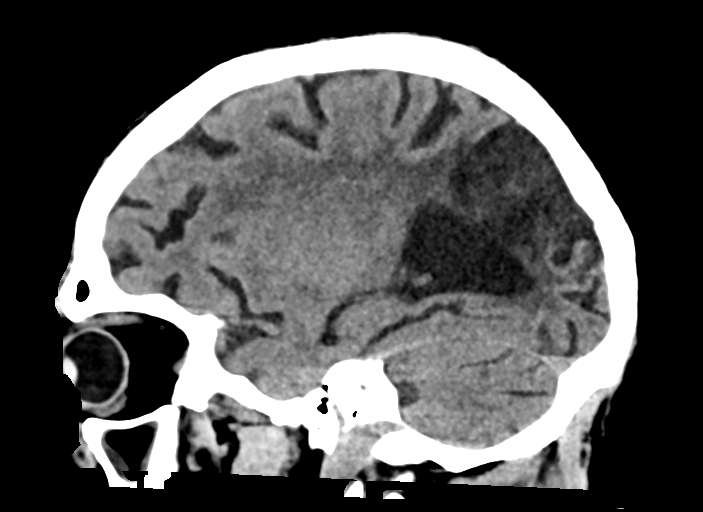

[15 of 47 positions shown; findings below may reference images not displayed]

FINDINGS: CT HEAD FINDINGS

Brain: Similar old infarct with encephalomalacia in the right
temporoparietal region. Remote left cerebellar lacunar infarct. No
evidence of acute large vascular territory infarct.

Vascular: No hyperdense vessel identified. Calcific intracranial
atherosclerosis

Skull: No acute fracture.

Sinuses/Orbits: Moderate mucosal thickening of the right maxillary
sinus and left sphenoid sinus with frothy secretions. No acute
orbital findings.

Other: No mastoid effusions.

CT CERVICAL SPINE FINDINGS

Motion limited.

Alignment: Straightening of normal cervical lordosis. No substantial
sagittal subluxation. Mild rotation of C1 on C2, probably
positional.

Skull base and vertebrae: Vertebral body heights are maintained. No
evidence of acute fracture.

Soft tissues and spinal canal: No prevertebral fluid or swelling. No
visible canal hematoma.

Disc levels: Moderate multilevel degenerative change, including
suspected ossification of the longitudinal ligament in the lower
cervical spine.

Upper chest: Visualized lung apices are clear.
IMPRESSION: CT head:

1. No evidence of acute intracranial abnormality.
2. Remote infarcts and chronic microvascular disease.

CT cervical spine:

1. No evidence of acute fracture or traumatic malalignment.
2. Degenerative changes, detailed above.

## 2021-09-01 IMAGING — CT CT CERVICAL SPINE W/O CM
3 of 4 series · 12 of 33 positions shown, 14 images · non-contrast
Comparison: CT head [DATE].

CLINICAL DATA: Polytrauma, blunt



[Series 6: sag bone · sagittal · 0.33mm/px · 5 of 71 slices shown, 6 images]
[im 24/71  bone]
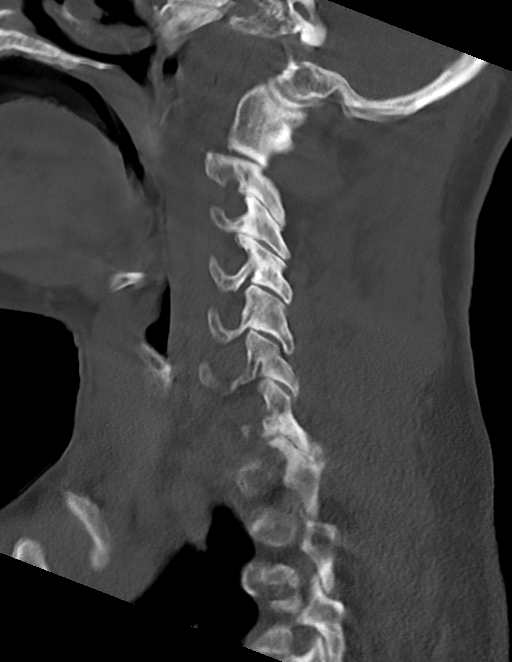
[im 30/71  bone]
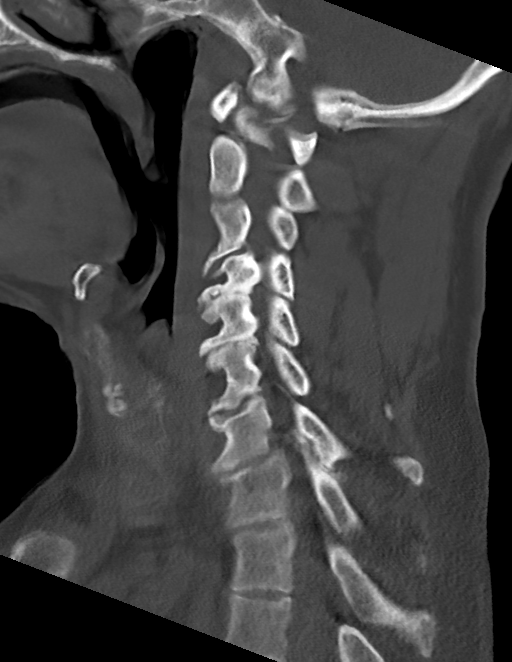
[im 36/71  soft-tissue]
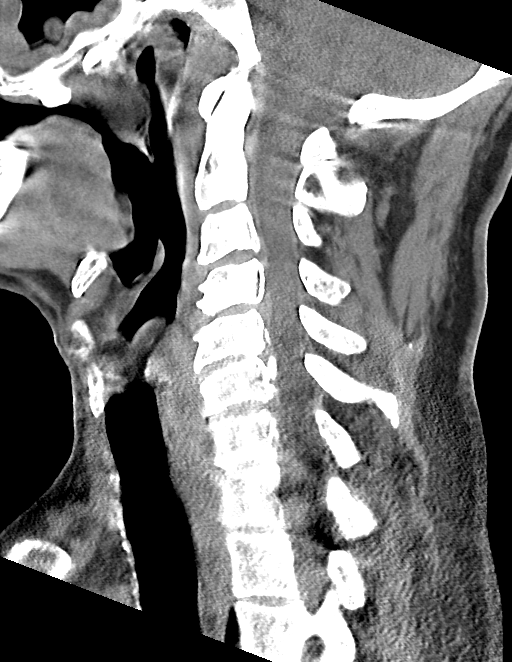
[im 36/71  bone]
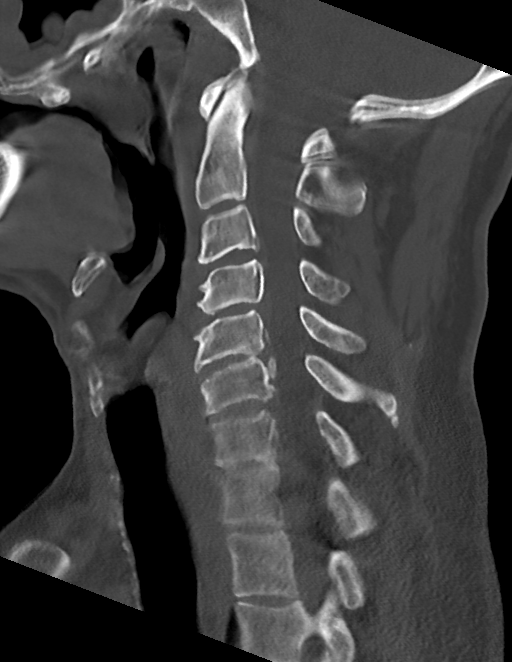
[im 41/71  bone]
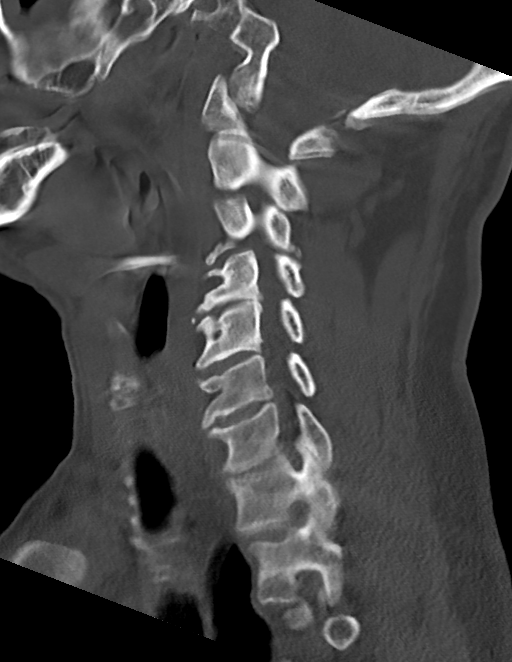
[im 47/71  bone]
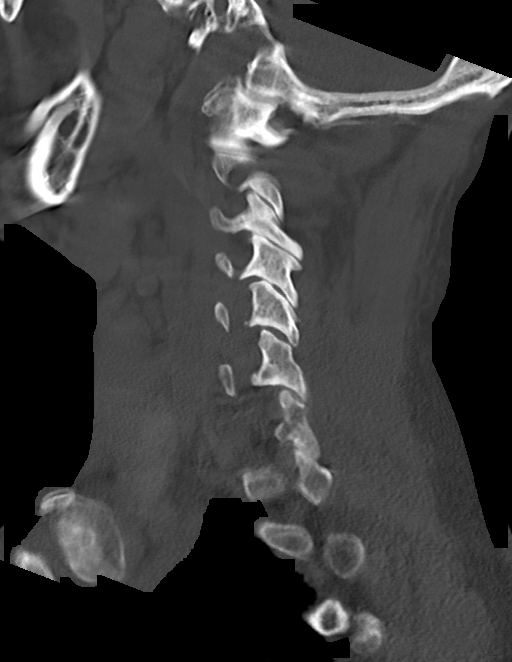

[Series 7: cor bone · coronal · 0.27mm/px · 3 of 84 slices shown]
[im 21/84  bone]
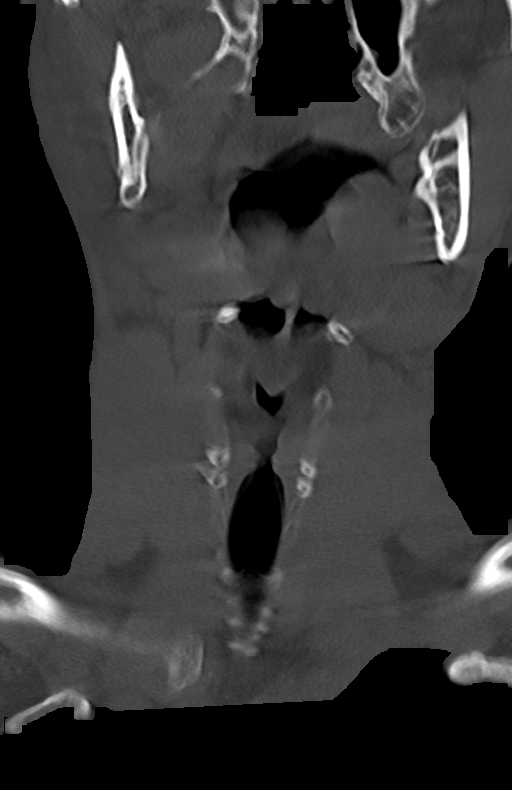
[im 35/84  bone]
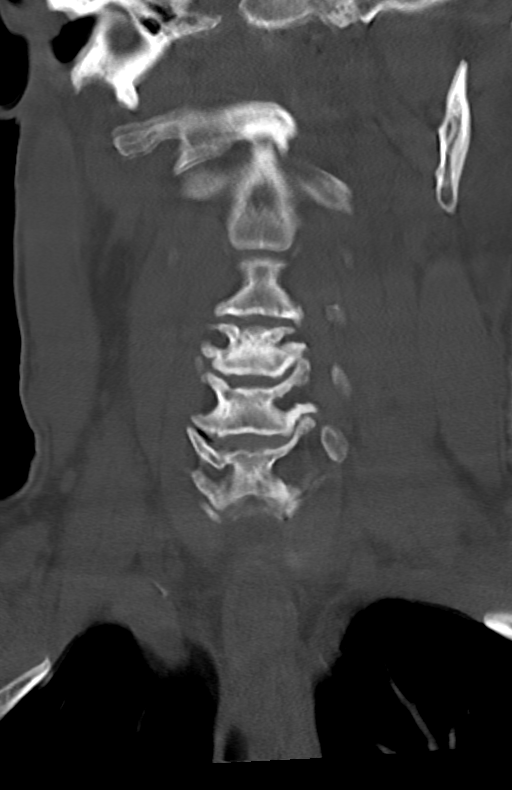
[im 49/84  bone]
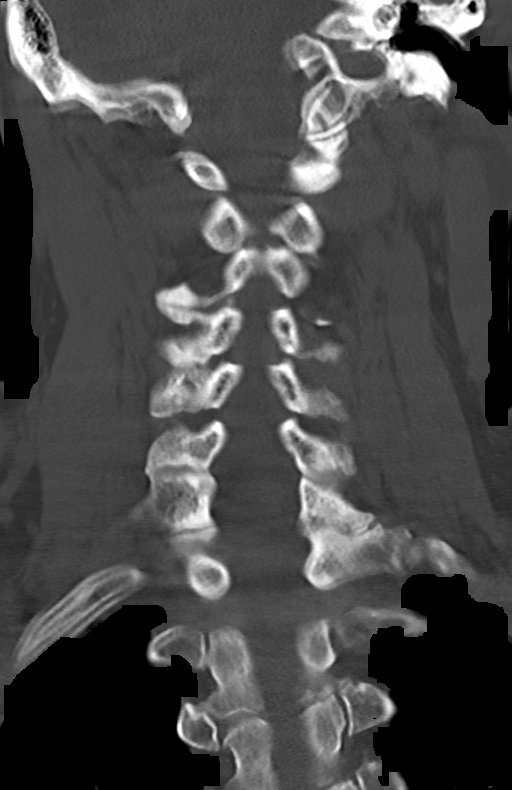

[Series 9: orthogonal axials · axial · 0.21mm/px · z∈[+1448,+1575]mm · 4 of 103 slices shown, 5 images]
[im 18/103  soft-tissue]
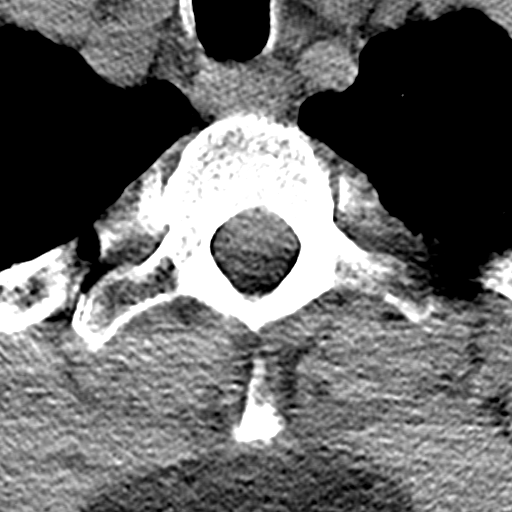
[im 18/103  bone]
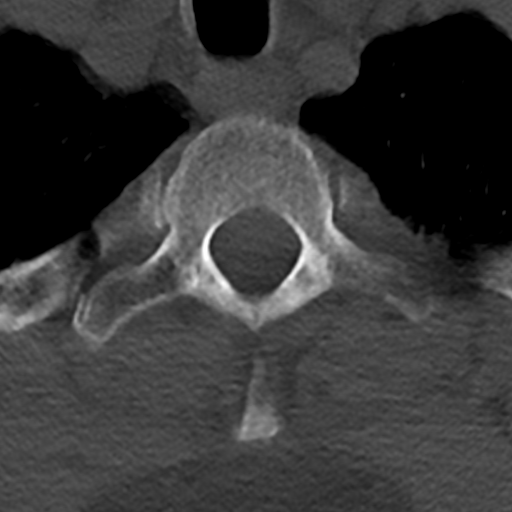
[im 35/103  bone]
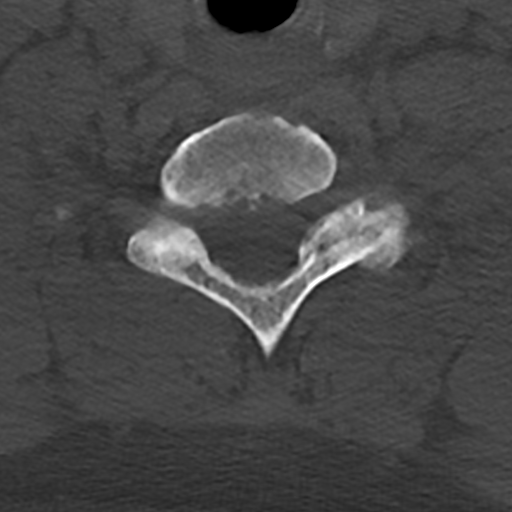
[im 69/103  bone]
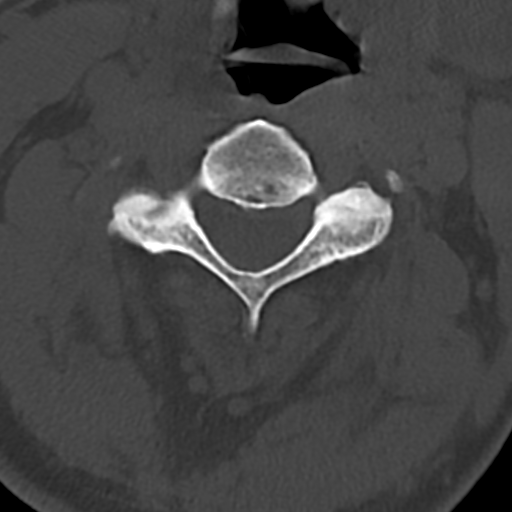
[im 86/103  bone]
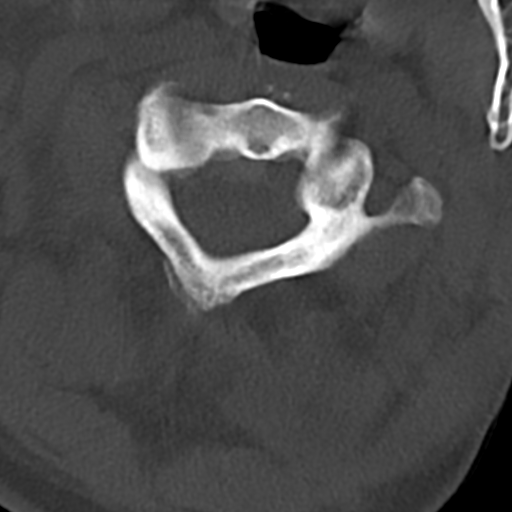

[12 of 33 positions shown; findings below may reference images not displayed]

FINDINGS: CT HEAD FINDINGS

Brain: Similar old infarct with encephalomalacia in the right
temporoparietal region. Remote left cerebellar lacunar infarct. No
evidence of acute large vascular territory infarct.

Vascular: No hyperdense vessel identified. Calcific intracranial
atherosclerosis

Skull: No acute fracture.

Sinuses/Orbits: Moderate mucosal thickening of the right maxillary
sinus and left sphenoid sinus with frothy secretions. No acute
orbital findings.

Other: No mastoid effusions.

CT CERVICAL SPINE FINDINGS

Motion limited.

Alignment: Straightening of normal cervical lordosis. No substantial
sagittal subluxation. Mild rotation of C1 on C2, probably
positional.

Skull base and vertebrae: Vertebral body heights are maintained. No
evidence of acute fracture.

Soft tissues and spinal canal: No prevertebral fluid or swelling. No
visible canal hematoma.

Disc levels: Moderate multilevel degenerative change, including
suspected ossification of the longitudinal ligament in the lower
cervical spine.

Upper chest: Visualized lung apices are clear.
IMPRESSION: CT head:

1. No evidence of acute intracranial abnormality.
2. Remote infarcts and chronic microvascular disease.

CT cervical spine:

1. No evidence of acute fracture or traumatic malalignment.
2. Degenerative changes, detailed above.

## 2021-09-01 IMAGING — DX DG CHEST 1V PORT
2 series · 2 of 2 positions shown · non-contrast
Comparison: Chest radiograph [DATE]

CLINICAL DATA: Seizure.

EXAM:
PORTABLE CHEST 1 VIEW

[chest ap (1 of 2)]
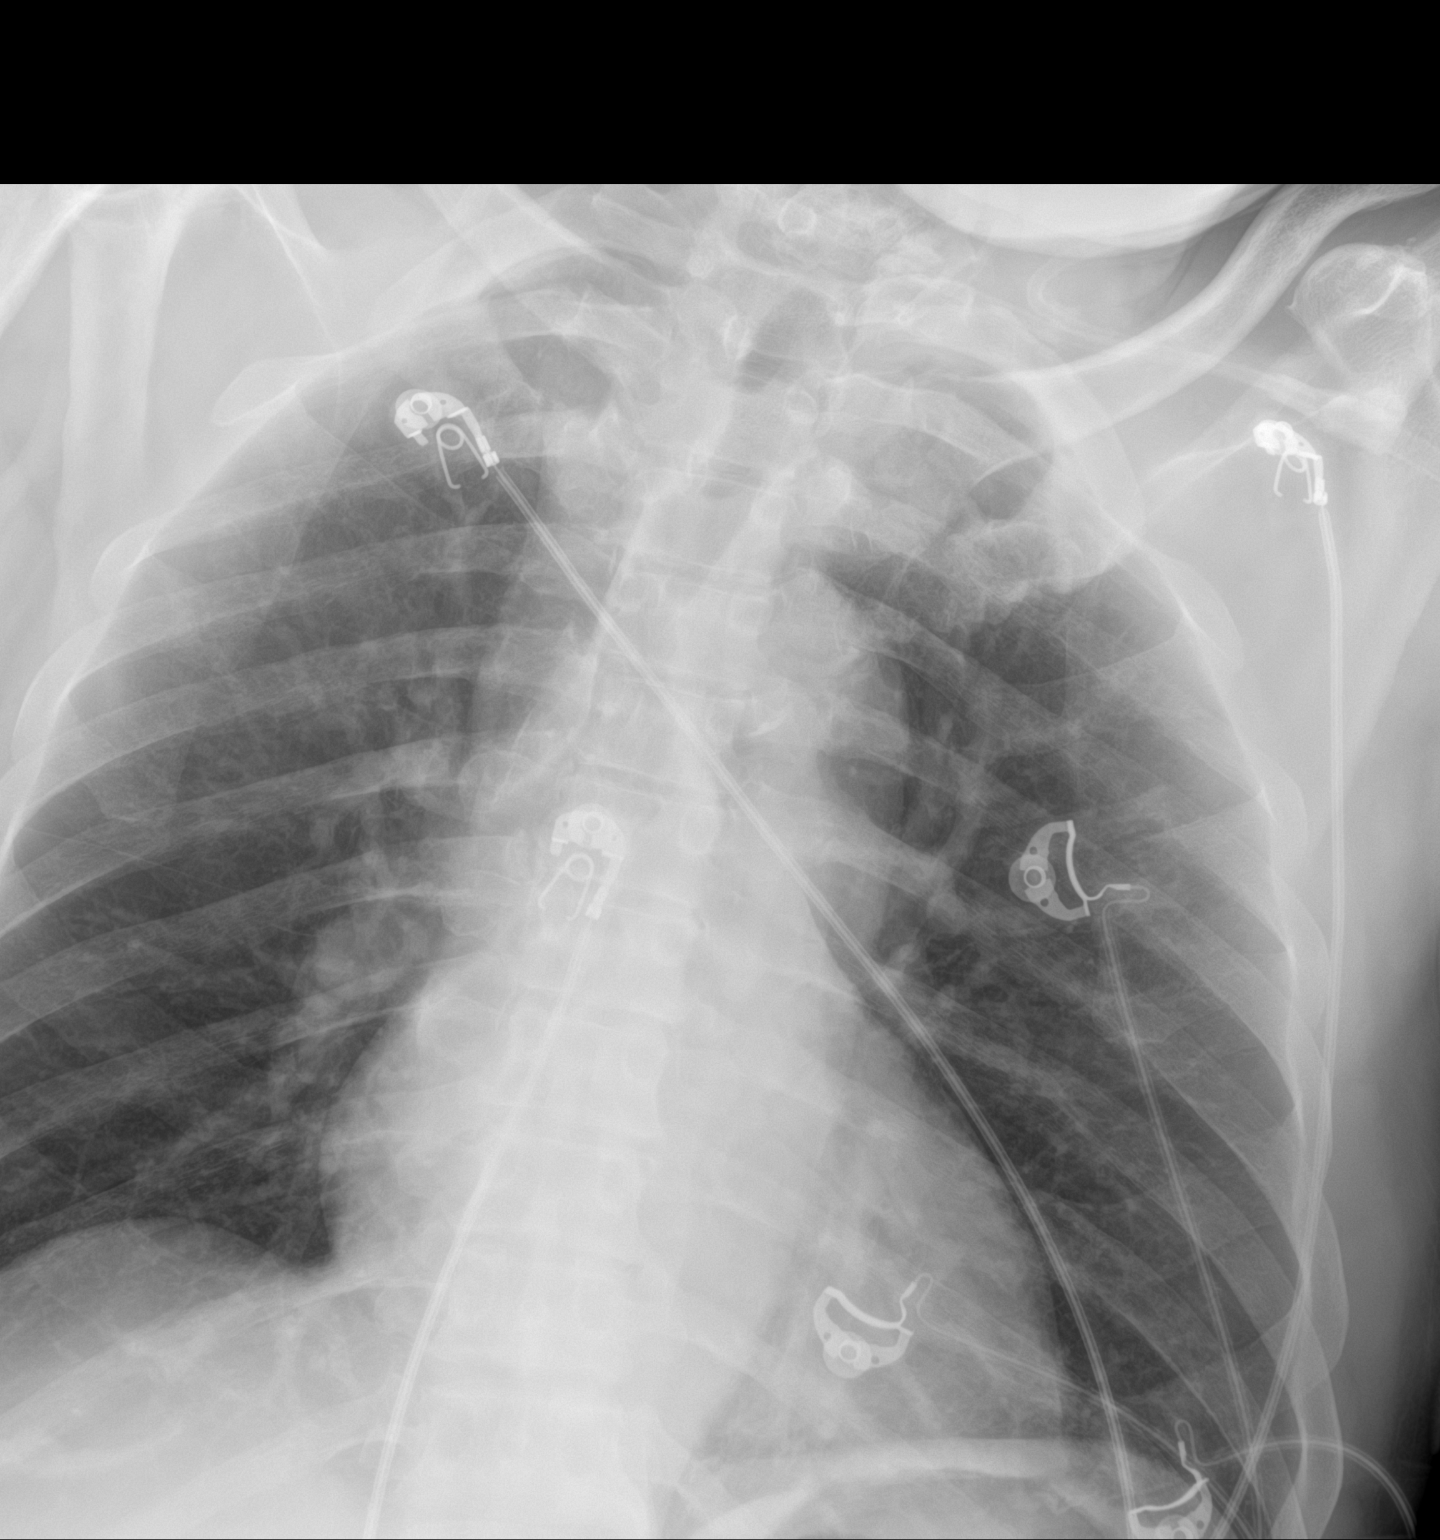

[chest ap (2 of 2)]
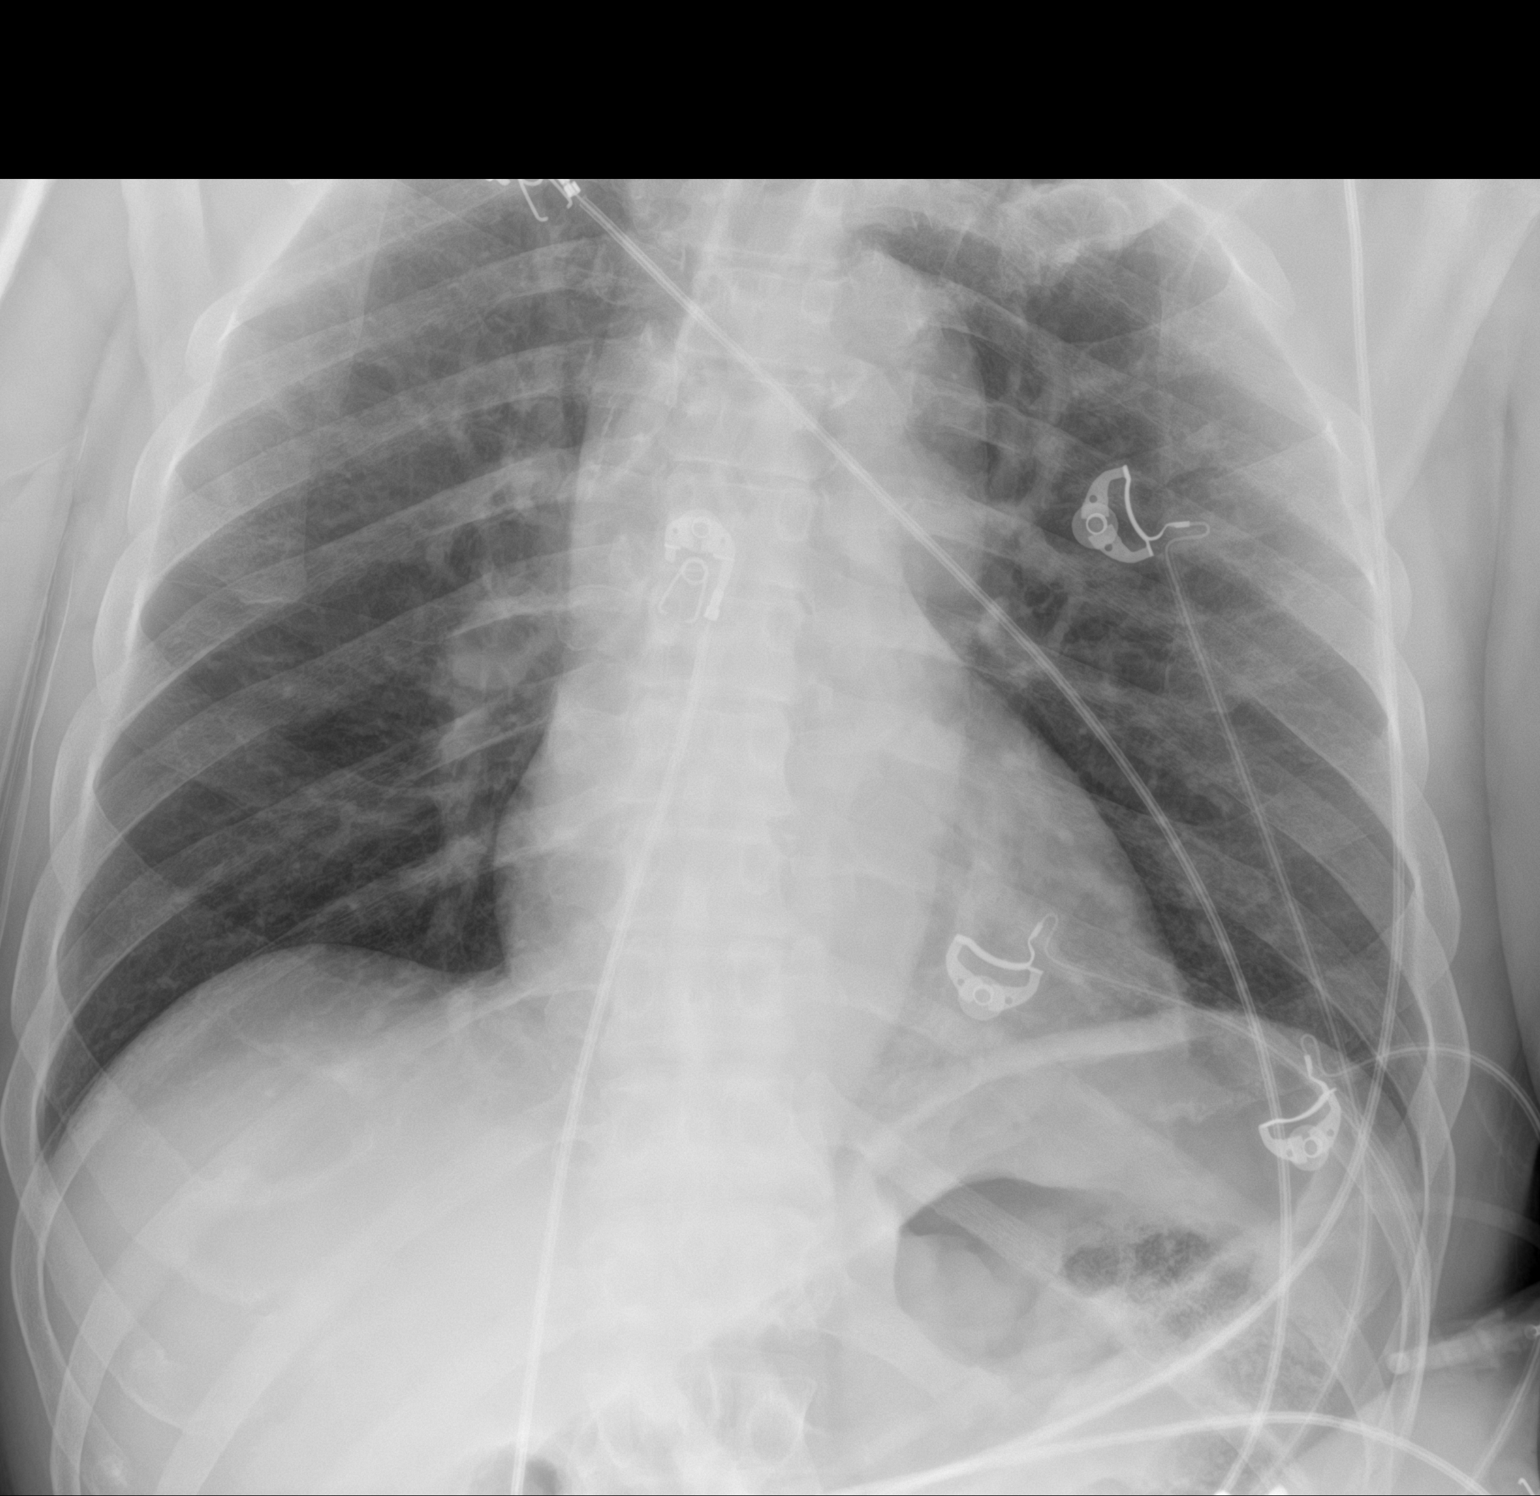

[2 of 2 positions shown; findings below may reference images not displayed]

FINDINGS: No consolidation. No visible pleural effusions or pneumothorax.
Cardiomediastinal silhouette is within normal limits. No displaced
fracture.
IMPRESSION: No evidence of acute cardiopulmonary disease.

## 2021-09-01 MED ORDER — ADULT MULTIVITAMIN W/MINERALS CH
1.0000 | ORAL_TABLET | Freq: Every day | ORAL | Status: DC
  Administered 2021-09-01 – 2021-09-07 (×7): 1 via ORAL
  Filled 2021-09-01 (×7): qty 1

## 2021-09-01 MED ORDER — ALBUTEROL SULFATE (2.5 MG/3ML) 0.083% IN NEBU: 2.5000 mg | INHALATION_SOLUTION | Freq: Four times a day (QID) | RESPIRATORY_TRACT | Status: DC | PRN

## 2021-09-01 MED ORDER — LORAZEPAM 1 MG PO TABS
0.0000 mg | ORAL_TABLET | Freq: Two times a day (BID) | ORAL | Status: AC
  Administered 2021-09-03 – 2021-09-04 (×2): 2 mg via ORAL
  Filled 2021-09-01 (×2): qty 2

## 2021-09-01 MED ORDER — LORAZEPAM 2 MG/ML IJ SOLN: 2.0000 mg | Freq: Once | INTRAMUSCULAR | Status: AC

## 2021-09-01 MED ORDER — ONDANSETRON HCL 4 MG PO TABS: 4.0000 mg | ORAL_TABLET | Freq: Four times a day (QID) | ORAL | Status: DC | PRN

## 2021-09-01 MED ORDER — LORAZEPAM 2 MG/ML IJ SOLN
0.0000 mg | Freq: Four times a day (QID) | INTRAMUSCULAR | Status: AC
  Administered 2021-09-01: 2 mg via INTRAVENOUS
  Filled 2021-09-01 (×2): qty 1

## 2021-09-01 MED ORDER — NICOTINE 21 MG/24HR TD PT24
21.0000 mg | MEDICATED_PATCH | Freq: Every day | TRANSDERMAL | Status: DC
Start: 2021-09-01 — End: 2021-09-07
  Administered 2021-09-01 – 2021-09-07 (×7): 21 mg via TRANSDERMAL
  Filled 2021-09-01 (×7): qty 1

## 2021-09-01 MED ORDER — LORAZEPAM 2 MG/ML IJ SOLN
0.0000 mg | Freq: Two times a day (BID) | INTRAMUSCULAR | Status: AC
  Administered 2021-09-05: 2 mg via INTRAVENOUS
  Filled 2021-09-01 (×2): qty 1

## 2021-09-01 MED ORDER — LORAZEPAM 1 MG PO TABS
1.0000 mg | ORAL_TABLET | ORAL | Status: AC | PRN
  Administered 2021-09-02: 2 mg via ORAL

## 2021-09-01 MED ORDER — LORAZEPAM 1 MG PO TABS
0.0000 mg | ORAL_TABLET | Freq: Four times a day (QID) | ORAL | Status: AC
  Administered 2021-09-02: 1 mg via ORAL
  Filled 2021-09-01: qty 2
  Filled 2021-09-01: qty 1

## 2021-09-01 MED ORDER — SODIUM CHLORIDE 0.9 % IV BOLUS
500.0000 mL | Freq: Once | INTRAVENOUS | Status: AC
  Administered 2021-09-01: 500 mL via INTRAVENOUS

## 2021-09-01 MED ORDER — FAMOTIDINE IN NACL 20-0.9 MG/50ML-% IV SOLN
20.0000 mg | Freq: Once | INTRAVENOUS | Status: AC
  Administered 2021-09-01: 20 mg via INTRAVENOUS
  Filled 2021-09-01: qty 50

## 2021-09-01 MED ORDER — ACETAMINOPHEN 325 MG PO TABS
650.0000 mg | ORAL_TABLET | Freq: Four times a day (QID) | ORAL | Status: DC | PRN
  Administered 2021-09-02 – 2021-09-05 (×2): 650 mg via ORAL
  Filled 2021-09-01 (×3): qty 2

## 2021-09-01 MED ORDER — FOLIC ACID 1 MG PO TABS
1.0000 mg | ORAL_TABLET | Freq: Every day | ORAL | Status: DC
  Administered 2021-09-01 – 2021-09-07 (×7): 1 mg via ORAL
  Filled 2021-09-01 (×7): qty 1

## 2021-09-01 MED ORDER — LORAZEPAM 2 MG/ML IJ SOLN
1.0000 mg | INTRAMUSCULAR | Status: AC | PRN
  Administered 2021-09-01: 1 mg via INTRAVENOUS
  Administered 2021-09-02 – 2021-09-04 (×8): 2 mg via INTRAVENOUS
  Filled 2021-09-01 (×9): qty 1

## 2021-09-01 MED ORDER — LORAZEPAM 2 MG/ML IJ SOLN
INTRAMUSCULAR | Status: AC
  Administered 2021-09-01: 2 mg via INTRAVENOUS
  Filled 2021-09-01: qty 1

## 2021-09-01 MED ORDER — ACETAMINOPHEN 650 MG RE SUPP: 650.0000 mg | Freq: Four times a day (QID) | RECTAL | Status: DC | PRN

## 2021-09-01 MED ORDER — SODIUM CHLORIDE 0.9% FLUSH
3.0000 mL | Freq: Two times a day (BID) | INTRAVENOUS | Status: DC
  Administered 2021-09-01 – 2021-09-07 (×10): 3 mL via INTRAVENOUS

## 2021-09-01 MED ORDER — THIAMINE HCL 100 MG PO TABS
100.0000 mg | ORAL_TABLET | Freq: Every day | ORAL | Status: DC
  Administered 2021-09-01 – 2021-09-07 (×7): 100 mg via ORAL
  Filled 2021-09-01 (×7): qty 1

## 2021-09-01 MED ORDER — ONDANSETRON HCL 4 MG/2ML IJ SOLN
4.0000 mg | Freq: Four times a day (QID) | INTRAMUSCULAR | Status: DC | PRN
Start: 2021-09-01 — End: 2021-09-07

## 2021-09-01 MED ORDER — AMLODIPINE BESYLATE 5 MG PO TABS
10.0000 mg | ORAL_TABLET | Freq: Every day | ORAL | Status: DC
  Administered 2021-09-01 – 2021-09-07 (×7): 10 mg via ORAL
  Filled 2021-09-01 (×9): qty 2

## 2021-09-01 MED ORDER — LEVETIRACETAM IN NACL 1000 MG/100ML IV SOLN
1000.0000 mg | Freq: Once | INTRAVENOUS | Status: AC
  Administered 2021-09-01: 1000 mg via INTRAVENOUS
  Filled 2021-09-01: qty 100

## 2021-09-01 MED ORDER — ONDANSETRON HCL 4 MG/2ML IJ SOLN
4.0000 mg | Freq: Once | INTRAMUSCULAR | Status: AC
  Administered 2021-09-01: 4 mg via INTRAVENOUS
  Filled 2021-09-01: qty 2

## 2021-09-01 MED ORDER — LEVETIRACETAM IN NACL 500 MG/100ML IV SOLN
500.0000 mg | Freq: Two times a day (BID) | INTRAVENOUS | Status: DC
  Administered 2021-09-01 – 2021-09-03 (×4): 500 mg via INTRAVENOUS
  Filled 2021-09-01 (×4): qty 100

## 2021-09-01 MED ORDER — THIAMINE HCL 100 MG/ML IJ SOLN: 100.0000 mg | Freq: Every day | INTRAMUSCULAR | Status: DC

## 2021-09-01 MED ORDER — ENOXAPARIN SODIUM 40 MG/0.4ML IJ SOSY
40.0000 mg | PREFILLED_SYRINGE | INTRAMUSCULAR | Status: DC
  Administered 2021-09-01 – 2021-09-06 (×6): 40 mg via SUBCUTANEOUS
  Filled 2021-09-01 (×6): qty 0.4

## 2021-09-01 NOTE — ED Provider Notes (Signed)
[facility] EMERGENCY DEPARTMENT Provider Note   CSN: [id number] Arrival date & time: 09/01/21  1202     History  Chief Complaint  Patient presents with   Seizures    Dominic Carter is a 66 y.o. male.  HPI   66 year old male with pmhx seizures on keppra, alcohol abuse presents to the ER after a reported witnessed seizure at home. EMS reports a 2 minute witnessed seizure episode. No noted tongue biting or incontinence. Patient unsure his compliance with Keppra, unclear if epilepsy vs alcohol withdrawal seizures. Last drink reported as last night.   Home Medications Prior to Admission medications   Medication Sig Start Date End Date Taking? Authorizing Provider  amLODipine (NORVASC) 10 MG tablet Take 1 tablet (10 mg total) by mouth daily. Patient taking differently: Take 10 mg by mouth See admin instructions. Take 10 mg by mouth once a day when able to remember. 07/12/21   [name], MD  aspirin EC 81 MG EC tablet Take 1 tablet (81 mg total) by mouth daily. Swallow whole. Patient not taking: Reported on 08/28/2020 01/27/20   Jonetta Osgood, MD  famotidine (PEPCID) 40 MG tablet Take 40 mg by mouth every evening. Patient not taking: Reported on 08/10/2021 08/08/20   [provider]  folic acid (FOLVITE) 1 MG tablet Take 1 tablet (1 mg total) by mouth daily. Patient not taking: Reported on 08/10/2021 09/06/20   Armando Reichert, MD  hydrocortisone cream 1 % Apply 1 application topically 2 (two) times daily. Patient not taking: Reported on 08/10/2021 09/05/20   Armando Reichert, MD  levETIRAcetam (KEPPRA) 500 MG tablet Take 1 tablet (500 mg total) by mouth 2 (two) times daily. Patient not taking: Reported on 08/10/2021 11/26/20   Quintella Reichert, MD  Multiple Vitamin (MULTIVITAMIN WITH MINERALS) TABS tablet Take 1 tablet by mouth daily. Patient not taking: Reported on 08/10/2021 09/05/20   Armando Reichert, MD  nicotine (NICODERM CQ - DOSED IN MG/24 HOURS) 21 mg/24hr patch  Place 1 patch (21 mg total) onto the skin daily. Patient not taking: Reported on 08/28/2020 02/09/20   Medina-Vargas, Monina C, NP  pravastatin (PRAVACHOL) 20 MG tablet Take 1 tablet (20 mg total) by mouth daily. Patient not taking: Reported on 08/10/2021 09/06/20   Armando Reichert, MD  sulfamethoxazole-trimethoprim (BACTRIM DS) 800-160 MG tablet Take 1 tablet by mouth 2 (two) times daily. 08/10/21   Fransico Meadow, PA-C  thiamine 100 MG tablet Take 1 tablet (100 mg total) by mouth daily. Patient not taking: Reported on 08/10/2021 09/06/20   Armando Reichert, MD      Allergies    Castor oil and Other    Review of Systems   Review of Systems  Physical Exam Updated Vital Signs BP (!) 172/96   Pulse (!) 102   Temp 97.6 F (36.4 C) (Oral)   Resp 12   SpO2 96%  Physical Exam  ED Results / Procedures / Treatments   Labs (all labs ordered are listed, but only abnormal results are displayed) Labs Reviewed  CBC WITH DIFFERENTIAL/PLATELET  COMPREHENSIVE METABOLIC PANEL  LIPASE, BLOOD  ETHANOL  LEVETIRACETAM LEVEL    EKG EKG Interpretation  Date/Time:  Sunday Sep 01 2021 12:19:54 EDT Ventricular Rate:  97 PR Interval:  159 QRS Duration: 89 QT Interval:  370 QTC Calculation: 470 R Axis:   40 Text Interpretation: Sinus rhythm RSR' in V1 or V2, right VCD or RVH Left ventricular hypertrophy Minimal ST elevation, anterolateral leads Partial  missing lead(s): V6 Confirmed by Lavenia Atlas [phone]) on 09/01/2021 1:30:59 PM  Radiology No results found.  Procedures Procedures    Medications Ordered in ED Medications  levETIRAcetam (KEPPRA) IVPB 1000 mg/100 mL premix (has no administration in time range)  sodium chloride 0.9 % bolus 500 mL (500 mLs Intravenous New Bag/Given 09/01/21 1240)  ondansetron (ZOFRAN) injection 4 mg (4 mg Intravenous Given 09/01/21 1234)  famotidine (PEPCID) IVPB 20 mg premix (20 mg Intravenous New Bag/Given 09/01/21 1238)  LORazepam (ATIVAN) injection 2 mg (2 mg  Intravenous Given 09/01/21 1320)    ED Course/ Medical Decision Making/ A&P                           Medical Decision Making Amount and/or Complexity of Data Reviewed Labs: ordered. Radiology: ordered.  Risk OTC drugs. Prescription drug management. Decision regarding hospitalization.   66 year old male with pmhx of seizures on keppra, possible alcohol withdrawal seizures, presents to the ER with a witnessed seizure at home. Patient has no recollection. Reports to me that family witnessed approx 2 min shaking episode, completed on EMS arrival. No seizure like activity en route. Patient states he has been baseline health, admits to daily alcohol use, believes his last drink was last night. Admits to seizure history, unsure when he took last Keppra dose, unsure if his seizures are alcohol WD related.   I was called to bedside while patient was having an active seizure.  Patient was displaying whole body generalized tonic-clonic seizure activity with sonorous respirations.  2 mg of Ativan given to the IV with cessation of shaking. Patient suctioned, post ictal.   On reevaluation patient opens his eyes to name, moves extremity, localizes to pain, continues to try to take off his nasal cannula.  Does not appear to be back to baseline however does not appear to be actively seizing.  Spoke with Dr. Theda Sers, neurology who agrees with ordering an EEG done here in the emergency department.  We will plan for admission.  VBG shows no hypercarbia, ammonia is normal.  Patients evaluation and results requires admission for further treatment and care.  Spoke with hospitalist Dr. Tamala Julian, reviewed patient's ED course and they accept admission.  Patient agrees with admission plan, offers no new complaints and is stable/unchanged at time of admit.        Final Clinical Impression(s) / ED Diagnoses Final diagnoses:  None    Rx / DC Orders ED Discharge Orders     None         Lorelle Gibbs,  DO 09/01/21 1714

## 2021-09-01 NOTE — ED Notes (Signed)
Pt had a witness seizure that lasted about 2-3 mins. EDP suctioned patient and was place on NRB.

## 2021-09-01 NOTE — ED Triage Notes (Signed)
Pt BIB [facility] from home after having ~31min witnessed seizure. Pt d/c-ed from rehab 6 months ago and is drinking again. Pt has hx of head lac x2 weeks, no f/u care. Currently vomiting.

## 2021-09-01 NOTE — H&P (Signed)
History and Physical    Patient: Dominic Carter FKC:[id number] DOB: 1955/12/02 DOA: 09/01/2021 DOS: the patient was seen and examined on 09/01/2021 PCP: Courtney Paris, NP  Patient coming from: Home  Chief Complaint:  Chief Complaint  Patient presents with   Seizures   HPI: Dominic Carter is a 66 y.o. male with medical history significant of hypertension, dyslipidemia, history of CVA,  DVT, history of seizures on Keppra,  s/p right BKA, and alcohol abuse who presents after being witnessed having seizure at home by his nephew.  In route with EMS patient was witnessed having an 2-minute seizure.   Over the phone his niece notes that the patient had been in rehab for alcohol abuse approximately 5 months ago.  He seemed to be doing well up until about a month ago when he started drinking again.  Patient reports that he drinks 2 "forties" of beer per day on average.  To her knowledge she is only taking some blood pressure medicine.  His niece also reports he has this wound on the top of his head that is been present since April.  It is not totally clear how the wound occurred.  Patient previously stated that he had fell and cut his scalp possibly on the fence.  However he was seen in the emergency department on 5/6 for the wound which was oozing at that time and started on Bactrim.  Since that time his niece has been keeping the wound clean, placing Neosporin, and covering it.  She reports that it looks better.  At this time she is out of town and is fearful for the patient being discharged home.  She wonders if the patient can be admitted to rehab from the hospital.  Upon admission to the emergency department patient was noted to be afebrile with pulse 86-107, blood pressure 157/97 to 172/96, and O2 saturation currently maintained on 2 L of nasal cannula oxygen.  Patient was witnessed having nausea and vomiting.  He was also  witnessed having generalized tonic-clonic seizure lasting 2-3 minutes.   Patient had been given 2 mg of Ativan and loaded with Keppra 1 g IV.  Labs significant for hemoglobin 12.4, CO2 20, calcium 8.4, anion gap 15, AST 57,  ALT 31, and alcohol level 135.  CT scan of the head did not note any acute abnormality and noted remote infarcts.  Chest x-ray was otherwise clear.  Dr. Thomasena Edis of neurology have been consulted and placed orders for stat EEG.  CIWA protocols have been initiated.  Review of Systems: As mentioned in the history of present illness. All other systems reviewed and are negative. Past Medical History:  Diagnosis Date   DVT (deep venous thrombosis) (HCC)    Dyslipidemia    ETOH abuse    Hx of BKA, right (HCC)    Hypertension    Seizure disorder (HCC)    Past Surgical History:  Procedure Laterality Date   BELOW KNEE LEG AMPUTATION Right    Social History:  reports that he has been smoking cigarettes. He has been smoking an average of 1 pack per day. He has never used smokeless tobacco. He reports current alcohol use. He reports that he does not use drugs.  Allergies  Allergen Reactions   Castor Oil Nausea And Vomiting   Other Nausea And Vomiting    Boiled Fat back meat    Family History  Problem Relation Age of Onset   Hypertension Mother    Hypertension Sister  Hypertension Brother    Hypertension Maternal Grandmother    Hypertension Maternal Grandfather     Prior to Admission medications   Medication Sig Start Date End Date Taking? Authorizing Provider  amLODipine (NORVASC) 10 MG tablet Take 1 tablet (10 mg total) by mouth daily. Patient taking differently: Take 10 mg by mouth See admin instructions. Take 10 mg by mouth once a day when able to remember. 07/12/21   Margarita Grizzleay, Danielle, MD  aspirin EC 81 MG EC tablet Take 1 tablet (81 mg total) by mouth daily. Swallow whole. Patient not taking: Reported on 08/28/2020 01/27/20   Maretta BeesGhimire, Shanker M, MD  famotidine (PEPCID) 40 MG tablet Take 40 mg by mouth every evening. Patient not taking:  Reported on 08/10/2021 08/08/20   [provider]  folic acid (FOLVITE) 1 MG tablet Take 1 tablet (1 mg total) by mouth daily. Patient not taking: Reported on 08/10/2021 09/06/20   Karsten Rooda, Vandana, MD  hydrocortisone cream 1 % Apply 1 application topically 2 (two) times daily. Patient not taking: Reported on 08/10/2021 09/05/20   Karsten Rooda, Vandana, MD  levETIRAcetam (KEPPRA) 500 MG tablet Take 1 tablet (500 mg total) by mouth 2 (two) times daily. Patient not taking: Reported on 08/10/2021 11/26/20   Tilden Fossaees, Elizabeth, MD  Multiple Vitamin (MULTIVITAMIN WITH MINERALS) TABS tablet Take 1 tablet by mouth daily. Patient not taking: Reported on 08/10/2021 09/05/20   Karsten Rooda, Vandana, MD  nicotine (NICODERM CQ - DOSED IN MG/24 HOURS) 21 mg/24hr patch Place 1 patch (21 mg total) onto the skin daily. Patient not taking: Reported on 08/28/2020 02/09/20   Medina-Vargas, Monina C, NP  pravastatin (PRAVACHOL) 20 MG tablet Take 1 tablet (20 mg total) by mouth daily. Patient not taking: Reported on 08/10/2021 09/06/20   Karsten Rooda, Vandana, MD  sulfamethoxazole-trimethoprim (BACTRIM DS) 800-160 MG tablet Take 1 tablet by mouth 2 (two) times daily. 08/10/21   Elson AreasSofia, Leslie K, PA-C  thiamine 100 MG tablet Take 1 tablet (100 mg total) by mouth daily. Patient not taking: Reported on 08/10/2021 09/06/20   Karsten Rooda, Vandana, MD    Physical Exam: Vitals:   09/01/21 1547 09/01/21 1615 09/01/21 1615 09/01/21 1645  BP: (!) 161/83 (!) 163/91 (!) 163/91 (!) 159/93  Pulse: 98 91 86 95  Resp: 16 18  19   Temp:      TempSrc:      SpO2: 97% 96%  98%   Exam  Constitutional: Elderly male currently in no distress Eyes: PERRL, lids and conjunctivae normal ENMT: Mucous membranes are moist. Posterior pharynx clear of any exudate or lesions. Edentulous Neck: normal, supple, no masses, no thyromegaly Respiratory: clear to auscultation bilaterally, no wheezing, no crackles. Normal respiratory effort. No accessory muscle use.  Cardiovascular: Regular rate and  rhythm, no murmurs / rubs / gallops. No extremity edema. 2+ pedal pulses. No carotid bruits.  Abdomen: no tenderness, no masses palpated. No hepatosplenomegaly. Bowel sounds positive.  Musculoskeletal: Clubbing present on the bilateral hands.  Right BKA. Skin: Healing wound the scalp as seen below  Neurologic: CN 2-12 grossly intact.  Able to move all extremities.  Tremor present.   Psychiatric: Normal judgment and insight.  Alert and oriented times person and place  Data Reviewed:  EKG reveals sinus rhythm at 97 bpm  Assessment and Plan: Seizures Patient presents after having seizure witnessed at home, and subsequent witnessed seizure while in the emergency department.  He has not been taking Keppra.  Patient currently appears to be somewhat postictal with tremor present.  Thought  to possibly be provoked in the setting of alcohol withdrawal versus focal seizure.  Patient has been loaded with Keppra 1 g. -Admit to a telemetry bed -Seizure precautions -Continuous pulse oximetry with oxygen maintain O2 saturations -Follow-up EEG monitoring -Continue Keppra 500 mg IV twice daily -Appreciate neurology consultative services, we will follow-up for any further recommendations  Alcohol abuse On admission alcohol level was noted to be 135.  Patient reports drinking two 40 ounce beers per day on average.  On physical exam tremor is present. -Continue CIWA protocols and added as needed Ativan every hour -Thiamine, Folate, MVI -Continue to counsel on need of cessation of alcohol -Please question if patient can be sent to rehab for alcohol abuse  Essential hypertension On admission blood pressures elevated up to 172/96.  Previous home blood pressure regimen include amlodipine 10 mg daily. -Resume amlodipine 10 mg daily -Hydralazine IV as needed for elevated blood pressures  Scalp wound Reported to have occurred after patient fell, but is not totally clear.  Wound appears to be healing well.   Previously, evaluated in the ED on 5/6 due to concern for infection treated with Bactrim. -Continue to keep wound clean and dry  Transaminitis Chronic.  AST 57 and ALT 31.  AST to ALT ratio consistent with alcohol abuse.  History of CVA  Right BKA  Tobacco abuse Patient smokes a pack of cigarettes per day on average -Nicotine patch offered  Advance Care Planning:   Code Status: Full Code    Consults: Neurology  Family Communication: Niece updated over the phone  Severity of Illness: The appropriate patient status for this patient is OBSERVATION. Observation status is judged to be reasonable and necessary in order to provide the required intensity of service to ensure the patient's safety. The patient's presenting symptoms, physical exam findings, and initial radiographic and laboratory data in the context of their medical condition is felt to place them at decreased risk for further clinical deterioration. Furthermore, it is anticipated that the patient will be medically stable for discharge from the hospital within 2 midnights of admission.   Author: Clydie Braun, MD 09/01/2021 4:55 PM  For on call review [email or web address].

## 2021-09-02 ENCOUNTER — Inpatient Hospital Stay (HOSPITAL_COMMUNITY): Payer: Medicare Other

## 2021-09-02 DIAGNOSIS — G40909 Epilepsy, unspecified, not intractable, without status epilepticus: Secondary | ICD-10-CM | POA: Diagnosis present

## 2021-09-02 DIAGNOSIS — Z91018 Allergy to other foods: Secondary | ICD-10-CM | POA: Diagnosis not present

## 2021-09-02 DIAGNOSIS — G4089 Other seizures: Secondary | ICD-10-CM | POA: Diagnosis present

## 2021-09-02 DIAGNOSIS — W19XXXA Unspecified fall, initial encounter: Secondary | ICD-10-CM | POA: Diagnosis not present

## 2021-09-02 DIAGNOSIS — Z8673 Personal history of transient ischemic attack (TIA), and cerebral infarction without residual deficits: Secondary | ICD-10-CM | POA: Diagnosis not present

## 2021-09-02 DIAGNOSIS — F17210 Nicotine dependence, cigarettes, uncomplicated: Secondary | ICD-10-CM | POA: Diagnosis present

## 2021-09-02 DIAGNOSIS — Y906 Blood alcohol level of 120-199 mg/100 ml: Secondary | ICD-10-CM | POA: Diagnosis present

## 2021-09-02 DIAGNOSIS — Z79899 Other long term (current) drug therapy: Secondary | ICD-10-CM | POA: Diagnosis not present

## 2021-09-02 DIAGNOSIS — Z89511 Acquired absence of right leg below knee: Secondary | ICD-10-CM | POA: Diagnosis not present

## 2021-09-02 DIAGNOSIS — Z91048 Other nonmedicinal substance allergy status: Secondary | ICD-10-CM | POA: Diagnosis not present

## 2021-09-02 DIAGNOSIS — R7401 Elevation of levels of liver transaminase levels: Secondary | ICD-10-CM | POA: Diagnosis present

## 2021-09-02 DIAGNOSIS — Z91148 Patient's other noncompliance with medication regimen for other reason: Secondary | ICD-10-CM | POA: Diagnosis not present

## 2021-09-02 DIAGNOSIS — E785 Hyperlipidemia, unspecified: Secondary | ICD-10-CM | POA: Diagnosis present

## 2021-09-02 DIAGNOSIS — I10 Essential (primary) hypertension: Secondary | ICD-10-CM | POA: Diagnosis present

## 2021-09-02 DIAGNOSIS — F10131 Alcohol abuse with withdrawal delirium: Secondary | ICD-10-CM | POA: Diagnosis present

## 2021-09-02 DIAGNOSIS — R569 Unspecified convulsions: Secondary | ICD-10-CM | POA: Diagnosis present

## 2021-09-02 DIAGNOSIS — E876 Hypokalemia: Secondary | ICD-10-CM | POA: Diagnosis not present

## 2021-09-02 DIAGNOSIS — F1010 Alcohol abuse, uncomplicated: Secondary | ICD-10-CM | POA: Diagnosis not present

## 2021-09-02 DIAGNOSIS — Y92239 Unspecified place in hospital as the place of occurrence of the external cause: Secondary | ICD-10-CM | POA: Diagnosis not present

## 2021-09-02 LAB — COMPREHENSIVE METABOLIC PANEL
ALT: 29 U/L (ref 0–44)
AST: 56 U/L — ABNORMAL HIGH (ref 15–41)
Albumin: 3.4 g/dL — ABNORMAL LOW (ref 3.5–5.0)
Alkaline Phosphatase: 66 U/L (ref 38–126)
Anion gap: 10 (ref 5–15)
BUN: 6 mg/dL — ABNORMAL LOW (ref 8–23)
CO2: 24 mmol/L (ref 22–32)
Calcium: 8.3 mg/dL — ABNORMAL LOW (ref 8.9–10.3)
Chloride: 103 mmol/L (ref 98–111)
Creatinine, Ser: 1.09 mg/dL (ref 0.61–1.24)
GFR, Estimated: >60 mL/min (ref >60)
Glucose, Bld: 80 mg/dL (ref 70–99)
Potassium: 3.6 mmol/L (ref 3.5–5.1)
Sodium: 137 mmol/L (ref 135–145)
Total Bilirubin: 0.9 mg/dL (ref 0.3–1.2)
Total Protein: 7.1 g/dL (ref 6.5–8.1)

## 2021-09-02 LAB — CBC
HCT: 37.4 % — ABNORMAL LOW (ref 39.0–52.0)
Hemoglobin: 12.2 g/dL — ABNORMAL LOW (ref 13.0–17.0)
MCH: 28.6 pg (ref 26.0–34.0)
MCHC: 32.6 g/dL (ref 30.0–36.0)
MCV: 87.6 fL (ref 80.0–100.0)
Platelets: 127 10*3/uL — ABNORMAL LOW (ref 150–400)
RBC: 4.27 MIL/uL (ref 4.22–5.81)
RDW: 16.9 % — ABNORMAL HIGH (ref 11.5–15.5)
WBC: 8.6 10*3/uL (ref 4.0–10.5)
nRBC: 0 % (ref 0.0–0.2)

## 2021-09-02 NOTE — Procedures (Signed)
Patient Name: Dominic Carter  MRN: [id number]  Epilepsy Attending: Charlsie Quest  Referring Physician/Provider: Rozelle Logan, DO Date: 09/02/2021 Duration: 21.34 mins  Patient history: 66 year old male with history of seizures, alcohol use who presented with breakthrough seizures.  EEG to evaluate for seizure.  Level of alertness: Awake, asleep  AEDs during EEG study: Ativan  Technical aspects: This EEG study was done with scalp electrodes positioned according to the 10-20 International system of electrode placement. Electrical activity was acquired at a sampling rate of 500Hz  and reviewed with a high frequency filter of 70Hz  and a low frequency filter of 1Hz . EEG data were recorded continuously and digitally stored.   Description: The posterior dominant rhythm consists of 8-9 Hz activity of moderate voltage (25-35 uV) seen predominantly in posterior head regions, symmetric and reactive to eye opening and eye closing. Sleep was characterized by vertex waves, sleep spindles (12 to 14 Hz), maximal frontocentral region. EEG showed intermittent generalized  3 to 6 Hz theta-delta slowing in right temporal region. Hyperventilation and photic stimulation were not performed.     ABNORMALITY - Intermittent slow, right temporal region  IMPRESSION: This study is suggestive of cortical dysfunction in right temporal region likely secondary to underlying stroke.  No seizures or epileptiform discharges were seen throughout the recording.  Brandin Dilday 

## 2021-09-02 NOTE — NC FL2 (Signed)
[place] MEDICAID FL2 LEVEL OF CARE SCREENING TOOL     IDENTIFICATION  Patient Name: Dominic Carter Birthdate: October 15, 1955 Sex: male Admission Date (Current Location): 09/01/2021  [facility] and [place] Number:  Producer, television/film/video and Address:  [facility] [place], 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)    Dyslipidemia 01/30/2020   Hepatitis, alcoholic, acute 01/30/2020   Seizure (HCC) 01/20/2020   ETOH abuse 01/20/2020   HTN (hypertension) 01/20/2020   Chronic ischemic right MCA stroke 01/20/2020    Orientation RESPIRATION BLADDER Height & Weight     Self, Situation, Place  O2 (2L Nasal cannula) Incontinent Weight:   Height:     BEHAVIORAL SYMPTOMS/MOOD NEUROLOGICAL BOWEL NUTRITION STATUS      Incontinent Diet (Please see DC Summary)  AMBULATORY STATUS COMMUNICATION OF NEEDS Skin   Limited Assist Verbally Other (Comment) (laceration on head)                       Personal Care Assistance Level of Assistance  Bathing, Feeding, Dressing Bathing Assistance: Maximum assistance Feeding assistance: Limited assistance Dressing Assistance: Limited assistance     Functional Limitations Info             SPECIAL CARE FACTORS FREQUENCY  PT (By licensed PT), OT (By licensed OT)     PT Frequency: 5x/week OT Frequency: 5x/week             Contractures Contractures Info: Not present    Additional Factors Info  Code Status, Allergies Code Status Info: Full Allergies Info: Castor oil;           Current Medications (09/02/2021):  This is the current hospital active medication list Current Facility-Administered Medications  Medication Dose Route Frequency Provider Last Rate Last Admin   acetaminophen (TYLENOL) tablet 650 mg  650 mg Oral Q6H PRN Madelyn Flavors A, MD   650 mg at 09/02/21 0830   Or   acetaminophen (TYLENOL) suppository 650 mg  650 mg Rectal Q6H PRN Madelyn Flavors A, MD       albuterol (PROVENTIL) (2.5 MG/3ML) 0.083% nebulizer solution 2.5 mg  2.5 mg Nebulization Q6H PRN Katrinka Blazing, Rondell A, MD       amLODipine (NORVASC) tablet 10 mg  10 mg Oral Daily Smith, Rondell A, MD   10 mg at 09/02/21 0830   enoxaparin (LOVENOX) injection 40 mg  40 mg Subcutaneous Q24H Smith, Rondell A, MD   40 mg at 09/01/21 1735   folic acid (FOLVITE) tablet 1 mg  1 mg Oral Daily Smith, Rondell A, MD   1 mg at 09/02/21 0830   levETIRAcetam (KEPPRA) IVPB 500 mg/100 mL premix  500 mg Intravenous Q12H Smith, Rondell A, MD 400 mL/hr at 09/02/21 0833 500 mg at 09/02/21 0833   LORazepam (ATIVAN) injection 0-4 mg  0-4 mg Intravenous Q6H Smith, Rondell A,  MD   2 mg at 09/01/21 1951   Or   LORazepam (ATIVAN) tablet 0-4 mg  0-4 mg Oral Q6H Smith, Rondell A, MD   1 mg at 09/02/21 0830   [START ON 09/03/2021] LORazepam (ATIVAN) injection 0-4 mg  0-4 mg Intravenous Q12H Smith, Rondell A, MD       Or   Melene Muller ON 09/03/2021] LORazepam (ATIVAN) tablet 0-4 mg  0-4 mg Oral Q12H Smith, Rondell A, MD       LORazepam (ATIVAN) tablet 1-4 mg  1-4 mg Oral Q1H PRN Madelyn Flavors A, MD   2 mg at 09/02/21 1247   Or   LORazepam (ATIVAN) injection 1-4 mg  1-4 mg Intravenous Q1H PRN Madelyn Flavors A, MD   2 mg at 09/02/21 0359   multivitamin with minerals tablet 1 tablet  1 tablet Oral Daily Madelyn Flavors A, MD   1 tablet at 09/02/21 0830   nicotine (NICODERM CQ -  dosed in mg/24 hours) patch 21 mg  21 mg Transdermal Daily Katrinka Blazing, Rondell A, MD   21 mg at 09/02/21 0831   ondansetron (ZOFRAN) tablet 4 mg  4 mg Oral Q6H PRN Madelyn Flavors A, MD       Or   ondansetron (ZOFRAN) injection 4 mg  4 mg Intravenous Q6H PRN Smith, Rondell A, MD       sodium chloride flush (NS) 0.9 % injection 3 mL  3 mL Intravenous Q12H Smith, Rondell A, MD   3 mL at 09/02/21 0835   thiamine tablet 100 mg  100 mg Oral Daily Smith, Rondell A, MD   100 mg at 09/02/21 0830   Or   thiamine (B-1) injection 100 mg  100 mg Intravenous Daily Clydie Braun, MD         Discharge Medications: Please see discharge summary for a list of discharge medications.  Relevant Imaging Results:  Relevant Lab Results:   Additional Information SS#: [id number]. Pfizer COVID-19 Vaccine 08/22/2019 , 07/05/2019  Mearl Latin, LCSW

## 2021-09-02 NOTE — Plan of Care (Signed)

## 2021-09-02 NOTE — Evaluation (Signed)
Physical Therapy Evaluation Patient Details Name: Dominic HurstRichard K Carter MRN: [id number] DOB: 05/24/1955 Today's Date: 09/02/2021  History of Present Illness  66 yo male admitted 5/28 with seizure. PMhx: seizure disorder, ETOH use, HTN, HLD, RT BKA, CVA  Clinical Impression  Pt with decreased attention , mumbled speech, decreased safety awareness and function, decreased strength and transfers who will benefit from acute therapy to maximize mobility and safety. Pt reports he lives with sister who works and states he is alone during the day. Pt unable to provide reliable PLOF or home setup without family present to confirm. Pt also stating he was in rehab previously, unable to state where and changed his mind 3x during session whether he felt rehab or home were better options. Pt admits to having fallen on his head and demonstrates poor safety awareness with transfers.        Recommendations for follow up therapy are one component of a multi-disciplinary discharge planning process, led by the attending physician.  Recommendations may be updated based on patient status, additional functional criteria and insurance authorization.  Follow Up Recommendations Skilled nursing-short term rehab (<3 hours/day)    Assistance Recommended at Discharge Frequent or constant Supervision/Assistance  Patient can return home with the following  A little help with walking and/or transfers;A little help with bathing/dressing/bathroom;Assistance with cooking/housework;Direct supervision/assist for financial management;Assist for transportation;Help with stairs or ramp for entrance;Direct supervision/assist for medications management    Equipment Recommendations None recommended by PT  Recommendations for Other Services       Functional Status Assessment Patient has had a recent decline in their functional status and demonstrates the ability to make significant improvements in function in a reasonable and predictable  amount of time.     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 Max cues for safety and sequence with pt ultimately putting residual limb underneath him to push into partial knee on bed on RLE and stand on LLE. Pt unable and unsafe to attempt hopping in standing and returned to sitting with pt placing residual limb back on bed prior to backing and sitting. Squat pivot bed to chair with max cues for safety and setup and mod assist to transfer    Ambulation/Gait               General Gait Details: pt unable  Stairs            Wheelchair Mobility    Modified Rankin (Stroke Patients Only)       Balance Overall balance assessment: Needs assistance   Sitting balance-Leahy Scale: Fair Sitting balance - Comments: able to static sit, impulsive with impaired balance with transfer attempts     Standing balance-Leahy Scale: Poor Standing balance comment: bil UE support and physical assist  Pertinent Vitals/Pain Pain Assessment Pain Assessment: No/denies pain    Home Living Family/patient expects to be discharged to:: Private residence Living Arrangements: Other relatives Available Help at Discharge: Available PRN/intermittently Type of Home:  House Home Access: Stairs to enter   [employer] of Steps: 3   Home Layout: One level Home Equipment: Agricultural consultant (2 wheels);Wheelchair - manual Additional Comments: uses RW or WC, no prosthesis, doesn't drive    Prior Function Prior Level of Function : Independent/Modified Independent             Mobility Comments: pt stating he walk short distances but unable to demonstrate and unable to state how he enters home with stairs ADLs Comments: pt reports he performs without assist but was incontinent of bowel immediately prior to eval     Hand Dominance        Extremity/Trunk Assessment   Upper Extremity Assessment Upper Extremity Assessment: Generalized weakness    Lower Extremity Assessment Lower Extremity Assessment: RLE deficits/detail RLE Deficits / Details: knee contracture grossly 20 degree flexion with noted skin hardening on anterior shin where pt weight bears on residual limb    Cervical / Trunk Assessment Cervical / Trunk Assessment: Normal;Other exceptions Cervical / Trunk Exceptions: pt with noted scab and bleeding to head. Pt reports where he fell on head then scratched it  Communication   Communication: Expressive difficulties  Cognition Arousal/Alertness: Awake/alert Behavior During Therapy: Flat affect Overall Cognitive Status: No family/caregiver present to determine baseline cognitive functioning Area of Impairment: Orientation, Attention, Memory, Following commands, Safety/judgement                 Orientation Level: Disoriented to, Time Current Attention Level: Sustained Memory: Decreased short-term memory Following Commands: Follows one step commands inconsistently, Follows one step commands with increased time Safety/Judgement: Decreased awareness of safety, Decreased awareness of deficits     General Comments: pt with mumbled speech at times and difficult to discern answers with lack of awareness for safety needing  repetition for mobility cues        General Comments      Exercises     Assessment/Plan    PT Assessment Patient needs continued PT services  PT Problem List Decreased mobility;Decreased coordination;Decreased activity tolerance;Decreased cognition;Decreased balance;Decreased knowledge of use of DME;Decreased skin integrity       PT Treatment Interventions DME instruction;Therapeutic exercise;Gait training;Balance training;Functional mobility training;Therapeutic activities;Patient/family education;Cognitive remediation;Stair training    PT Goals (Current goals can be found in the Care Plan section)  Acute Rehab PT Goals PT Goal Formulation: Patient unable to participate in goal setting Time For Goal Achievement: 09/16/21 Potential to Achieve Goals: Fair    Frequency Min 2X/week     Co-evaluation               AM-PAC PT "6 Clicks" Mobility  Outcome Measure Help needed turning from your back to your side while in a flat bed without using bedrails?: A Little Help needed moving from lying on your back to sitting on the side of a flat bed without using bedrails?: A Little Help needed moving to and from a bed to a chair (including a wheelchair)?: A Little Help needed standing up from a chair using your arms (e.g., wheelchair or bedside chair)?: A Little Help needed to walk in hospital room?: Total Help needed climbing 3-5 steps with a railing? : Total 6 Click Score: 14    End of Session Equipment Utilized During Treatment: Gait belt Activity Tolerance: Patient tolerated  treatment well Patient left: in chair;with call bell/phone within reach;with chair alarm set Nurse Communication: Mobility status PT Visit Diagnosis: Other abnormalities of gait and mobility (R26.89);Difficulty in walking, not elsewhere classified (R26.2);Muscle weakness (generalized) (M62.81);Repeated falls (R29.6);Unsteadiness on feet (R26.81)    Time: 1751-0258 PT Time Calculation (min) (ACUTE  ONLY): 27 min   Charges:   PT Evaluation $PT Eval Moderate Complexity: 1 Mod PT Treatments $Therapeutic Activity: 8-22 mins        Coumba Kellison P, PT Acute Rehabilitation Services Pager: [phone] Office: [phone]   Caron Ode B Andelyn Spade 09/02/2021, 11:10 AM

## 2021-09-02 NOTE — Progress Notes (Signed)
PROGRESS NOTE        PATIENT DETAILS Name: Dominic Carter Age: 66 y.o. Sex: male Date of Birth: 01/30/1956 Admit Date: 09/01/2021 Admitting Physician Clydie Braunondell A Smith, MD ZOX:WRUEAPCP:McCoy, Fleet Contrasachel, NP  Brief Summary: Patient is a 66 y.o.  male with history of right BKA, EtOH use, seizure disorder-who presented with breakthrough seizures.  Significant events: 5/28>> admit to [facility]-breakthrough seizures  Significant studies: 5/28>> CT head: No acute intracranial abnormality 5/28>> CT C-spine: No fracture/dislocation 5/28>> CXR: No acute cardiopulmonary disease  Significant microbiology data:   Procedures:   Consults: None   Subjective: Sleepy-slightly tremulous but awake and alert.  Objective: Vitals: Blood pressure 134/77, pulse 94, temperature 98.9 F (37.2 C), temperature source Oral, resp. rate 18, SpO2 97 %.   Exam: Gen Exam:Alert awake-not in any distress HEENT:atraumatic, normocephalic Chest: B/L clear to auscultation anteriorly CVS:S1S2 regular Abdomen:soft non tender, non distended Extremities:no edema Neurology: Non focal Skin: no rash  Pertinent Labs/Radiology:    Latest Ref Rng & Units 09/02/2021    1:07 AM 09/01/2021    3:52 PM 09/01/2021   12:26 PM  CBC  WBC 4.0 - 10.5 K/uL 8.6    4.8    Hemoglobin 13.0 - 17.0 g/dL 54.012.2   98.112.9   19.112.4    Hematocrit 39.0 - 52.0 % 37.4   38.0   38.4    Platelets 150 - 400 K/uL 127    153      Lab Results  Component Value Date   NA 137 09/02/2021   K 3.6 09/02/2021   CL 103 09/02/2021   CO2 24 09/02/2021      Assessment/Plan: Breakthrough seizures: In the setting of ongoing alcohol use and possible noncompliance with Keppra.  Loaded with Keppra in the emergency room-now on 500 mg of Keppra twice daily.  Awaiting EEG.  EtOH use/EtOH withdrawal: Sleepy-somewhat tremulous-but relatively awake and alert-continue Ativan per CIWA protocol.  HTN: BP stable on amlodipine.  Chronic scalp wound:  Present since April following a fall-does not appear infected-continue wound care as previous.  May need plastic referral as an outpatient.  History of CVA  History of right BKA  Tobacco abuse: On transdermal nicotine.  BMI: Estimated body mass index is 24.35 kg/m as calculated from the following:   Height as of 08/10/21: 5\' 11"  (1.803 m).   Weight as of 08/10/21: 79.2 kg.   Code status:   Code Status: Full Code   DVT Prophylaxis: enoxaparin (LOVENOX) injection 40 mg Start: 09/01/21 1800   Family Communication: Niece-Brittany-[phone]-left VM on 5/29  Disposition Plan: Status is: Observation The patient will require care spanning > 2 midnights and should be moved to inpatient because: Seizure disorder-awaiting EEG-alcohol withdrawal-noted stable for discharge.   Planned Discharge Destination:Home   Diet: Diet Order             Diet regular Room service appropriate? Yes; Fluid consistency: Thin  Diet effective now                     Antimicrobial agents: Anti-infectives (From admission, onward)    None        MEDICATIONS: Scheduled Meds:  amLODipine  10 mg Oral Daily   enoxaparin (LOVENOX) injection  40 mg Subcutaneous Q24H   folic acid  1 mg Oral Daily   LORazepam  0-4 mg Intravenous Q6H  Or   LORazepam  0-4 mg Oral Q6H   [START ON 09/03/2021] LORazepam  0-4 mg Intravenous Q12H   Or   [START ON 09/03/2021] LORazepam  0-4 mg Oral Q12H   multivitamin with minerals  1 tablet Oral Daily   nicotine  21 mg Transdermal Daily   sodium chloride flush  3 mL Intravenous Q12H   thiamine  100 mg Oral Daily   Or   thiamine  100 mg Intravenous Daily   Continuous Infusions:  levETIRAcetam 500 mg (09/02/21 0833)   PRN Meds:.acetaminophen **OR** acetaminophen, albuterol, LORazepam **OR** LORazepam, ondansetron **OR** ondansetron (ZOFRAN) IV   I have personally reviewed following labs and imaging studies  LABORATORY DATA: CBC: Recent Labs  Lab  09/01/21 1226 09/01/21 1552 09/02/21 0107  WBC 4.8  --  8.6  NEUTROABS 3.1  --   --   HGB 12.4* 12.9* 12.2*  HCT 38.4* 38.0* 37.4*  MCV 89.5  --  87.6  PLT 153  --  127*    Basic Metabolic Panel: Recent Labs  Lab 09/01/21 1226 09/01/21 1552 09/02/21 0107  NA 142 143 137  K 3.8 3.9 3.6  CL 107  --  103  CO2 20*  --  24  GLUCOSE 115*  --  80  BUN 5*  --  6*  CREATININE 1.00  --  1.09  CALCIUM 8.4*  --  8.3*    GFR: CrCl cannot be calculated (Unknown ideal weight.).  Liver Function Tests: Recent Labs  Lab 09/01/21 1226 09/02/21 0107  AST 57* 56*  ALT 31 29  ALKPHOS 73 66  BILITOT 0.4 0.9  PROT 7.0 7.1  ALBUMIN 3.4* 3.4*   Recent Labs  Lab 09/01/21 1226  LIPASE 42   Recent Labs  Lab 09/01/21 1545  AMMONIA 30    Coagulation Profile: No results for input(s): INR, PROTIME in the last 168 hours.  Cardiac Enzymes: No results for input(s): CKTOTAL, CKMB, CKMBINDEX, TROPONINI in the last 168 hours.  BNP (last 3 results) No results for input(s): PROBNP in the last 8760 hours.  Lipid Profile: No results for input(s): CHOL, HDL, LDLCALC, TRIG, CHOLHDL, LDLDIRECT in the last 72 hours.  Thyroid Function Tests: No results for input(s): TSH, T4TOTAL, FREET4, T3FREE, THYROIDAB in the last 72 hours.  Anemia Panel: No results for input(s): VITAMINB12, FOLATE, FERRITIN, TIBC, IRON, RETICCTPCT in the last 72 hours.  Urine analysis:    Component Value Date/Time   COLORURINE YELLOW 11/11/2020 1604   APPEARANCEUR CLEAR 11/11/2020 1604   LABSPEC 1.014 11/11/2020 1604   PHURINE 5.0 11/11/2020 1604   GLUCOSEU NEGATIVE 11/11/2020 1604   HGBUR MODERATE (A) 11/11/2020 1604   BILIRUBINUR NEGATIVE 11/11/2020 1604   KETONESUR NEGATIVE 11/11/2020 1604   PROTEINUR 30 (A) 11/11/2020 1604   NITRITE NEGATIVE 11/11/2020 1604   LEUKOCYTESUR NEGATIVE 11/11/2020 1604    Sepsis Labs: Lactic Acid, Venous No results found for: LATICACIDVEN  MICROBIOLOGY: No results found  for this or any previous visit (from the past 240 hour(s)).  RADIOLOGY STUDIES/RESULTS: CT Head Wo Contrast  Result Date: 09/01/2021 CLINICAL DATA:  Polytrauma, blunt EXAM: CT HEAD WITHOUT CONTRAST CT CERVICAL SPINE WITHOUT CONTRAST TECHNIQUE: Multidetector CT imaging of the head and cervical spine was performed following the standard protocol without intravenous contrast. Multiplanar CT image reconstructions of the cervical spine were also generated. RADIATION DOSE REDUCTION: This exam was performed according to the departmental dose-optimization program which includes automated exposure control, adjustment of the mA and/or kV according to patient  size and/or use of iterative reconstruction technique. COMPARISON:  CT head 11/25/2020. FINDINGS: CT HEAD FINDINGS Brain: Similar old infarct with encephalomalacia in the right temporoparietal region. Remote left cerebellar lacunar infarct. No evidence of acute large vascular territory infarct. Vascular: No hyperdense vessel identified. Calcific intracranial atherosclerosis Skull: No acute fracture. Sinuses/Orbits: Moderate mucosal thickening of the right maxillary sinus and left sphenoid sinus with frothy secretions. No acute orbital findings. Other: No mastoid effusions. CT CERVICAL SPINE FINDINGS Motion limited. Alignment: Straightening of normal cervical lordosis. No substantial sagittal subluxation. Mild rotation of C1 on C2, probably positional. Skull base and vertebrae: Vertebral body heights are maintained. No evidence of acute fracture. Soft tissues and spinal canal: No prevertebral fluid or swelling. No visible canal hematoma. 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 Upper chest: Visualized lung apices are clear. IMPRESSION: CT head: 1. No evidence of acute intracranial abnormality. 2. Remote infarcts and chronic microvascular disease. CT cervical spine:  1. No evidence of acute fracture or traumatic malalignment. 2. Degenerative changes, detailed above. Electronically Signed   By: Feliberto Harts M.D.   On: 09/01/2021 13:30   CT Cervical Spine Wo Contrast  Result Date: 09/01/2021 CLINICAL DATA:  Polytrauma, blunt EXAM: CT HEAD WITHOUT CONTRAST CT CERVICAL SPINE WITHOUT CONTRAST TECHNIQUE: Multidetector CT imaging of the head and cervical spine was performed following the standard protocol without intravenous contrast. Multiplanar CT image reconstructions of the cervical spine were also generated. RADIATION DOSE REDUCTION: This exam was performed according to the departmental dose-optimization program which includes automated exposure control, adjustment of the mA and/or kV according to patient size and/or use of iterative reconstruction technique. COMPARISON:  CT head 11/25/2020. FINDINGS: CT HEAD FINDINGS Brain: Similar old infarct with encephalomalacia in the right temporoparietal region. Remote left cerebellar lacunar infarct. No evidence of acute large vascular territory infarct. Vascular: No hyperdense vessel identified. Calcific intracranial atherosclerosis Skull: No acute fracture. Sinuses/Orbits: Moderate mucosal thickening of the right maxillary sinus and left sphenoid sinus with frothy secretions. No acute orbital findings. Other: No mastoid effusions. CT CERVICAL SPINE FINDINGS Motion limited. Alignment: Straightening of normal cervical lordosis. No substantial sagittal subluxation. Mild rotation of C1 on C2, probably positional. Skull base and vertebrae: Vertebral body heights are maintained. No evidence of acute fracture. Soft tissues and spinal canal: No prevertebral fluid or swelling. No visible canal hematoma. 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 Upper chest: Visualized lung apices are clear. IMPRESSION: CT head: 1. No evidence of acute  intracranial abnormality. 2. Remote infarcts and chronic microvascular disease. CT cervical spine: 1. No evidence of acute fracture or traumatic malalignment. 2. Degenerative changes, detailed above. Electronically Signed   By: Feliberto Harts M.D.   On: 09/01/2021 13:30   DG Chest Port 1 View  Result Date: 09/01/2021 CLINICAL DATA:  Seizure. EXAM: PORTABLE CHEST 1 VIEW COMPARISON:  Chest radiograph 11/26/2020 FINDINGS: No consolidation. No visible pleural effusions or pneumothorax. Cardiomediastinal silhouette is within normal limits. No displaced fracture. IMPRESSION: No evidence of acute cardiopulmonary disease. Electronically Signed   By: Feliberto Harts M.D.   On: 09/01/2021 14:22     LOS: 0 days   Jeoffrey Massed, MD  [facility]    To contact the attending provider between 7A-7P or the covering provider during after hours 7P-7A, please log into the web site [email or web address] and access using universal [place] password for that  web site. If you do not have the password, please call the hospital operator.  09/02/2021, 8:45 AM

## 2021-09-02 NOTE — Evaluation (Signed)
Occupational Therapy Evaluation Patient Details Name: Dominic Carter MRN: [id number] DOB: 1955-04-11 Today's Date: 09/02/2021   History of Present Illness 66 yo male admitted 5/28 with seizure. PMhx: seizure disorder, ETOH use, HTN, HLD, RT BKA, CVA   Clinical Impression   Pt with lethargy and slurred speech. Reports he used a w/c for mobility and performed his own transfers and basic ADLs. He lives with his sister who works. Pt presents with generalized weakness and incontinence. He needs set up to total assist for ADLs and min assist to stand for pericare. Recommending SNF at this time. Will updated recommendation as pt progresses.      Recommendations for follow up therapy are one component of a multi-disciplinary discharge planning process, led by the attending physician.  Recommendations may be updated based on patient status, additional functional criteria and insurance authorization.   Follow Up Recommendations  Skilled nursing-short term rehab (<3 hours/day)    Assistance Recommended at Discharge Frequent or constant Supervision/Assistance  Patient can return home with the following A little help with walking and/or transfers;A lot of help with bathing/dressing/bathroom;Assistance with cooking/housework;Direct supervision/assist for medications management;Direct supervision/assist for financial management;Assist for transportation;Help with stairs or ramp for entrance    Functional Status Assessment  Patient has had a recent decline in their functional status and demonstrates the ability to make significant improvements in function in a reasonable and predictable amount of time.  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      Mobility Bed Mobility Overal bed mobility: Needs  Assistance Bed Mobility: Supine to Sit, Sit to Supine     Supine to sit: Min guard Sit to supine: Modified independent (Device/Increase time)   General bed mobility comments: HOB flat, pulls into long sitting and pivoted to EOB, returned to supine without assist    Transfers Overall transfer level: Needs assistance Equipment used: Rolling walker (2 wheels) Transfers: Sit to/from Stand Sit to Stand: Min assist           General transfer comment: stood for pericare, min assist to rise and steady      Balance Overall balance assessment: Needs assistance   Sitting balance-Leahy Scale: Fair       Standing balance-Leahy Scale: Poor Standing balance comment: bil UE support and min physical assist                           ADL either performed or assessed with clinical judgement   ADL Overall ADL's : Needs assistance/impaired Eating/Feeding: Set up;Sitting Eating/Feeding Details (indicate cue type and reason): only observed drinking with cup with straw Grooming: Wash/dry face;Sitting;Supervision/safety Grooming Details (indicate cue type and reason): decreased thoroughness Upper Body Bathing: Maximal assistance;Sitting   Lower Body Bathing: Total assistance;Sit to/from stand   Upper Body Dressing : Moderate assistance;Sitting   Lower Body Dressing: Total assistance;Bed level       Toileting- Clothing Manipulation and Hygiene: Total assistance;Sit to/from stand               Vision Ability to See in Adequate Light: 0 Adequate Patient Visual Report: No change from baseline       Perception     Praxis      Pertinent Vitals/Pain Pain Assessment Pain Assessment: No/denies pain     Hand Dominance Right  Extremity/Trunk Assessment Upper Extremity Assessment Upper Extremity Assessment: Generalized weakness   Lower Extremity Assessment Lower Extremity Assessment: Defer to PT evaluation RLE Deficits / Details: knee contracture grossly 20  degree flexion with noted skin hardening on anterior shin where pt weight bears on residual limb   Cervical / Trunk Assessment Cervical / Trunk Assessment: Normal;Other exceptions Cervical / Trunk Exceptions: wound on top of head   Communication Communication Communication: Expressive difficulties   Cognition Arousal/Alertness: Awake/alert Behavior During Therapy: Flat affect Overall Cognitive Status: Impaired/Different from baseline Area of Impairment: Orientation, Attention, Memory, Following commands, Safety/judgement, Awareness, Problem solving                 Orientation Level: Time, Situation Current Attention Level: Sustained Memory: Decreased short-term memory, Decreased recall of precautions Following Commands: Follows one step commands inconsistently, Follows one step commands with increased time Safety/Judgement: Decreased awareness of safety, Decreased awareness of deficits Awareness: Intellectual Problem Solving: Slow processing, Difficulty sequencing, Decreased initiation, Requires verbal cues, Requires tactile cues General Comments: assisted to place call to sister, but recalled her phone number, pt with lethargy during interview     General Comments       Exercises     Shoulder Instructions      Home Living Family/patient expects to be discharged to:: Private residence Living Arrangements: Other relatives Available Help at Discharge: Available PRN/intermittently Type of Home: House Home Access: Stairs to enter [employer] of Steps: 3   Home Layout: One level     Bathroom Shower/Tub: Chief Strategy Officer: Standard     Home Equipment: Agricultural consultant (2 wheels);Wheelchair - manual   Additional Comments: uses RW or WC, no prosthesis, doesn't drive      Prior Functioning/Environment Prior Level of Function : Independent/Modified Independent             Mobility Comments: reports he uses w/c as primary means of  mobility ADLs Comments: reports independence        OT Problem List: Impaired balance (sitting and/or standing);Decreased activity tolerance;Decreased strength;Decreased knowledge of use of DME or AE;Decreased cognition;Decreased safety awareness      OT Treatment/Interventions: Self-care/ADL training;DME and/or AE instruction;Patient/family education;Balance training;Cognitive remediation/compensation;Therapeutic activities    OT Goals(Current goals can be found in the care plan section) Acute Rehab OT Goals OT Goal Formulation: Patient unable to participate in goal setting Time For Goal Achievement: 09/16/21 Potential to Achieve Goals: Good ADL Goals Pt Will Perform Grooming: with supervision;sitting Pt Will Perform Upper Body Bathing: with supervision;sitting Pt Will Perform Lower Body Bathing: with supervision;sitting/lateral leans Pt Will Perform Upper Body Dressing: with supervision;sitting Pt Will Perform Lower Body Dressing: with supervision;sit to/from stand Pt Will Transfer to Toilet: with supervision;stand pivot transfer;bedside commode Pt Will Perform Toileting - Clothing Manipulation and hygiene: with supervision;sitting/lateral leans  OT Frequency: Min 2X/week    Co-evaluation              AM-PAC OT "6 Clicks" Daily Activity     Outcome Measure Help from another person eating meals?: A Little Help from another person taking care of personal grooming?: A Little Help from another person toileting, which includes using toliet, bedpan, or urinal?: Total Help from another person bathing (including washing, rinsing, drying)?: A Lot Help from another person to put on and taking off regular upper body clothing?: A Lot Help from another person to put on and taking off regular lower body clothing?: Total 6 Click Score: 12   End of Session Equipment  Utilized During Treatment: Gait belt;Rolling walker (2 wheels)  Activity Tolerance: Patient tolerated treatment  well Patient left: in bed;with call bell/phone within reach;with bed alarm set  OT Visit Diagnosis: Unsteadiness on feet (R26.81);Other abnormalities of gait and mobility (R26.89);Muscle weakness (generalized) (M62.81);Other symptoms and signs involving cognitive function                Time: 1143-1200 OT Time Calculation (min): 17 min Charges:  OT General Charges $OT Visit: 1 Visit OT Evaluation $OT Eval Moderate Complexity: 1 Mod  Martie RoundJulie Kairi Tufo, OTR/L Acute Rehabilitation Services Pager: [phone] Office: [phone]   Evern BioMayberry, Omesha Bowerman Lynn 09/02/2021, 2:20 PM

## 2021-09-02 NOTE — Progress Notes (Signed)
Patient unable to redirect.  He continuously attempts to roll over bed rails.  Orders received for a Air cabin crew.

## 2021-09-02 NOTE — Progress Notes (Signed)
EEG complete - results pending 

## 2021-09-02 NOTE — TOC Initial Note (Signed)
Transition of Care [facility]) - Initial/Assessment Note    Patient Details  Name: Dominic Carter MRN: [id number] Date of Birth: 29-Jan-1956  [facility]) CM/SW Contact:    Benard Halsted, LCSW Phone Number: 09/02/2021, 2:29 PM  Clinical Narrative:                 CSW received consult for possible SNF placement at time of discharge. CSW spoke with patient's niece. She reported that patient's lives with her and her son but they are currently unable to care for patient at their home given patient's current physical needs and fall risk. She expressed understanding of PT recommendation and is agreeable to SNF placement at time of discharge. CSW discussed insurance authorization process and will provide Medicare SNF ratings list. Patient has received COVID vaccines. CSW will send out referrals for review once CIWA score is less. CSW also went over need for a Medicaid application if she feels patient does not progress at rehab. [facility].   Skilled Nursing Rehab Facilities-   [email or web address]   Ratings out of 5 possible   Name Address  Phone # [place] Inspection Overall  [facility] [address] [place] 4 5 2 3   [facility]  [address] [place], [place] [phone] 3 2 5 5   [facility] [place], [place] 3 1 1 1   [place], [place] 3 2 4 4   [facility] [address], [place] 1 1 2 1   [place]. [address] [place] 2 1 4 3   [facility] [address] [place] 5 2 3 4   [facility] [address] [place] 5 2 2 3   [address] ([place]) [place], [place] [phone] 5 1 2 2   [facility] [phone], [place] [phone] 4 1 2 1   [facility]  [address], [facility] [phone] 4 1 2 1   [facility] ([place]) [place]. [place], [place] [phone] 4 1 1 1   [facility] Flock [address] [id number] 3 2 4 4           [facility] [address], [address] [phone]      [facility] [place] 4 2 3 3   [facility] [place] [address] [place] 4 1 5 4   [facility], [place] [address], [place] [phone] 2 1 1 1   [facility] [address], [facility] [phone] 2 1 3 2           [address] (no [facility]) [place] [facility], [place] [phone] 4 5 5 5   [facility] (No Humana) [address], [place] 3 1 4 3   [place] (No UHC) [place], [place] 5 5 5 5   [facility] [address] [facility] [phone] 3 2 4 4   [place] [address] [place] 1 1 2 1   [place] [address], [address] 2 1 1 1   [place], [place] 5 2 4 5   [facility] [address], [place] 3 1 1 1   [facility] [place], [place] 2 1 2 1           [facility] [address] [place]  [phone] 1 1 1 1   [place] [address] [place]  [phone] 2 4 2 2   [facility] [place] [address], [place] [phone] 5 2 3 4   [facility] [place] [address], [place] 2 1 1 1   [place] (No Humana) [address] [place], [place] 2 1 3 2   [facility] [address], [place] [phone] 3 1 1 1           [facility] [place], [place] 5 4 5 5   [facility])  [facility] [address], [place] 2 2 3 3   [facility]) [place], [place] 3 2 4 4   [place] [address] [place] 4 3 4 4   [address] [place], [place] 3 3 1 1   [place] [facility]) [address] [place] [phone] 2 2 4 4      Expected Discharge Plan: [place] Barriers to Discharge: Continued Medical Work up, [address], SNF Pending bed offer   Patient Goals and CMS Choice Patient states their goals for this hospitalization and ongoing recovery are:: Rehab CMS Medicare.gov Compare Post Acute Care list provided to:: Patient Represenative (must comment) Choice offered to / list presented to :  (Niece)  Expected Discharge Plan and Services Expected Discharge Plan: [place] In-house Referral: Clinical Social Work   Post Acute Care Choice: [place] Living arrangements for the past 2 months: [place]                                      Prior Living Arrangements/Services Living arrangements for the past 2 months: Single Family Home Lives with:: Relatives (Niece) Patient language and need for interpreter reviewed:: Yes Do you feel safe going back to the place where you live?: Yes      Need for Family Participation in Patient Care: Yes (Comment) Care giver support system in place?: Yes (comment)   Criminal Activity/Legal Involvement Pertinent to Current Situation/Hospitalization: No - Comment as needed  Activities of Daily Living      Permission Sought/Granted Permission sought to share information with : Facility [phone], Family Supports Permission granted to share information with : Yes, Verbal Permission Granted  Share Information with NAME: Sport and exercise psychologist  Permission granted to share info w AGENCY: SNFs  Permission granted to share info w Relationship: Niece  Permission granted to share info w Contact Information: [phone]  Emotional Assessment Appearance:: Appears stated age Attitude/Demeanor/Rapport: Unable to Assess Affect  (typically observed): Unable to Assess Orientation: : Oriented to Self, Oriented to Place, Oriented to  Time Alcohol / Substance Use: Alcohol Use Psych Involvement: No (comment)  Admission diagnosis:  Seizure [facility]) [R56.9] Patient Active Problem List   Diagnosis Date Noted   Transaminitis 09/01/2021   Tobacco abuse 09/01/2021   History of CVA (cerebrovascular accident) 09/01/2021   Scalp wound 09/01/2021   Alcohol withdrawal syndrome with perceptual disturbance ([place])    Dyslipidemia 01/30/2020   Hepatitis, alcoholic, acute 02/12/2020   Seizure ([place]) 01/20/2020   ETOH abuse 01/20/2020   HTN (hypertension) 01/20/2020   Chronic ischemic right MCA stroke 01/20/2020   PCP:  02/02/2020, NP Pharmacy:   [place], [place] [address] [place], [place] Phone: [phone]  Fax: [phone]  [facility] [address] [place] [address] Phone: [phone] Fax: [phone]  [facility] [phone] - [place], [place] [address] Phone: [phone] Fax: [phone]  [facility] [place], [address] [place] [place] [address] Phone: [phone] Fax: [phone]     Social Determinants of Health (SDOH) Interventions    Readmission Risk Interventions     View : No data to display.

## 2021-09-03 DIAGNOSIS — I10 Essential (primary) hypertension: Secondary | ICD-10-CM | POA: Diagnosis not present

## 2021-09-03 DIAGNOSIS — Z8673 Personal history of transient ischemic attack (TIA), and cerebral infarction without residual deficits: Secondary | ICD-10-CM | POA: Diagnosis not present

## 2021-09-03 DIAGNOSIS — R569 Unspecified convulsions: Secondary | ICD-10-CM | POA: Diagnosis not present

## 2021-09-03 DIAGNOSIS — F1010 Alcohol abuse, uncomplicated: Secondary | ICD-10-CM | POA: Diagnosis not present

## 2021-09-03 LAB — BASIC METABOLIC PANEL
Anion gap: 11 (ref 5–15)
BUN: 5 mg/dL — ABNORMAL LOW (ref 8–23)
CO2: 24 mmol/L (ref 22–32)
Calcium: 8.6 mg/dL — ABNORMAL LOW (ref 8.9–10.3)
Chloride: 102 mmol/L (ref 98–111)
Creatinine, Ser: 0.93 mg/dL (ref 0.61–1.24)
GFR, Estimated: >60 mL/min (ref >60)
Glucose, Bld: 79 mg/dL (ref 70–99)
Potassium: 3.2 mmol/L — ABNORMAL LOW (ref 3.5–5.1)
Sodium: 137 mmol/L (ref 135–145)

## 2021-09-03 LAB — PHOSPHORUS: Phosphorus: 2.7 mg/dL (ref 2.5–4.6)

## 2021-09-03 LAB — MAGNESIUM: Magnesium: 1.9 mg/dL (ref 1.7–2.4)

## 2021-09-03 LAB — LEVETIRACETAM LEVEL: Levetiracetam Lvl: 18.7 ug/mL (ref 10.0–40.0)

## 2021-09-03 MED ORDER — MAGNESIUM SULFATE IN D5W 1-5 GM/100ML-% IV SOLN
1.0000 g | Freq: Once | INTRAVENOUS | Status: AC
  Administered 2021-09-03: 1 g via INTRAVENOUS
  Filled 2021-09-03: qty 100

## 2021-09-03 MED ORDER — LEVETIRACETAM 500 MG PO TABS
500.0000 mg | ORAL_TABLET | Freq: Two times a day (BID) | ORAL | Status: DC
  Administered 2021-09-03 – 2021-09-07 (×8): 500 mg via ORAL
  Filled 2021-09-03 (×8): qty 1

## 2021-09-03 MED ORDER — POTASSIUM PHOSPHATES 15 MMOLE/5ML IV SOLN
30.0000 mmol | Freq: Once | INTRAVENOUS | Status: AC
  Administered 2021-09-03: 30 mmol via INTRAVENOUS
  Filled 2021-09-03: qty 10

## 2021-09-03 NOTE — Progress Notes (Signed)
   09/03/21 1900  Assess: MEWS Score  Temp (!) 97.4 F (36.3 C)  BP (!) 154/82  Pulse Rate (!) 118  ECG Heart Rate (!) 118  Resp (!) 30  SpO2 95 %  Assess: MEWS Score  MEWS Temp 0  MEWS Systolic 0  MEWS Pulse 2  MEWS RR 2  MEWS LOC 0  MEWS Score 4  MEWS Score Color Red  Assess: if the MEWS score is Yellow or Red  Were vital signs taken at a resting state? No  Focused Assessment No change from prior assessment  Early Detection of Sepsis Score *See Row Information* Low  MEWS guidelines implemented *See Row Information* No, vital signs rechecked   Vital signed retaken.  Patient is actively trying to get out of bed.  Patient is on CIWA protocol.  Will attempt to retake vitals after patient is more calm.

## 2021-09-03 NOTE — Progress Notes (Addendum)
PROGRESS NOTE        PATIENT DETAILS Name: Dominic Carter Age: 66 y.o. Sex: male Date of Birth: 11-09-1955 Admit Date: 09/01/2021 Admitting Physician Dewayne Shorter Levora Dredge, MD WYO:VZCHY, Fleet Contras, NP  Brief Summary: Patient is a 66 y.o.  male with history of right BKA, EtOH use, seizure disorder-who presented with breakthrough seizures.  Significant events: 5/28>> admit to [facility]-breakthrough seizures  Significant studies: 5/28>> CT head: No acute intracranial abnormality 5/28>> CT C-spine: No fracture/dislocation 5/28>> CXR: No acute cardiopulmonary disease 5/29>> EEG: No seizures.  Significant microbiology data:   Procedures:   Consults: None   Subjective: Had some delirium last night-relatively awake and alert this morning.  Able to tell me his name-follow commands.  Moves all 4 extremities when asked.  Objective: Vitals: Blood pressure (!) 116/94, pulse 94, temperature 97.7 F (36.5 C), temperature source Oral, resp. rate 15, SpO2 96 %.   Exam: Gen Exam:Alert awake-not in any distress HEENT:atraumatic, normocephalic Chest: B/L clear to auscultation anteriorly CVS:S1S2 regular Abdomen:soft non tender, non distended Extremities: S/p right BKA. Neurology: Non focal Skin: no rash   Pertinent Labs/Radiology:    Latest Ref Rng & Units 09/02/2021    1:07 AM 09/01/2021    3:52 PM 09/01/2021   12:26 PM  CBC  WBC 4.0 - 10.5 K/uL 8.6    4.8    Hemoglobin 13.0 - 17.0 g/dL 85.0   27.7   41.2    Hematocrit 39.0 - 52.0 % 37.4   38.0   38.4    Platelets 150 - 400 K/uL 127    153      Lab Results  Component Value Date   NA 137 09/03/2021   K 3.2 (L) 09/03/2021   CL 102 09/03/2021   CO2 24 09/03/2021       Assessment/Plan: Breakthrough seizures: In the setting of ongoing alcohol use and possible noncompliance with Keppra.  Continue Keppra-EEG negative for seizures.    EtOH use/EtOH withdrawal: Apparently was delirious last night-continue  Ativan per CIWA protocol.  Currently awake-and relatively alert.  Once he is out of withdrawal-we will need counseling.    Hypokalemia: Replete and recheck.  HTN: BP stable on amlodipine.  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  May need plastic referral as an outpatient.  History of CVA  History of right BKA  Tobacco abuse: On transdermal nicotine.  BMI: Estimated body mass index is 24.35 kg/m as calculated from the following:   Height as of 08/10/21: 5\' 11"  (1.803 m).   Weight as of 08/10/21: 79.2 kg.   Code status:   Code Status: Full Code   DVT Prophylaxis: enoxaparin (LOVENOX) injection 40 mg Start: 09/01/21 1800   Family Communication: Niece-Brittany-[phone]- on 5/29 (she called back)-left a voicemail on 5/30  Disposition Plan: Status is: Observation The patient will require care spanning > 2 midnights and should be moved to inpatient because: Seizure disorder-awaiting EEG-alcohol withdrawal-noted stable for discharge.   Planned Discharge Destination:Home   Diet: Diet Order             Diet regular Room service appropriate? Yes; Fluid consistency: Thin  Diet effective now                     Antimicrobial agents: Anti-infectives (From admission, onward)  None        MEDICATIONS: Scheduled Meds:  amLODipine  10 mg Oral Daily   enoxaparin (LOVENOX) injection  40 mg Subcutaneous Q24H   folic acid  1 mg Oral Daily   LORazepam  0-4 mg Intravenous Q6H   Or   LORazepam  0-4 mg Oral Q6H   LORazepam  0-4 mg Intravenous Q12H   Or   LORazepam  0-4 mg Oral Q12H   multivitamin with minerals  1 tablet Oral Daily   nicotine  21 mg Transdermal Daily   sodium chloride flush  3 mL Intravenous Q12H   thiamine  100 mg Oral Daily   Or   thiamine  100 mg Intravenous Daily   Continuous Infusions:  levETIRAcetam 500 mg (09/03/21 1007)   potassium PHOSPHATE IVPB (in mmol) 30 mmol  (09/03/21 1059)   PRN Meds:.acetaminophen **OR** acetaminophen, albuterol, LORazepam **OR** LORazepam, ondansetron **OR** ondansetron (ZOFRAN) IV   I have personally reviewed following labs and imaging studies  LABORATORY DATA: CBC: Recent Labs  Lab 09/01/21 1226 09/01/21 1552 09/02/21 0107  WBC 4.8  --  8.6  NEUTROABS 3.1  --   --   HGB 12.4* 12.9* 12.2*  HCT 38.4* 38.0* 37.4*  MCV 89.5  --  87.6  PLT 153  --  127*     Basic Metabolic Panel: Recent Labs  Lab 09/01/21 1226 09/01/21 1552 09/02/21 0107 09/03/21 0212  NA 142 143 137 137  K 3.8 3.9 3.6 3.2*  CL 107  --  103 102  CO2 20*  --  24 24  GLUCOSE 115*  --  80 79  BUN 5*  --  6* 5*  CREATININE 1.00  --  1.09 0.93  CALCIUM 8.4*  --  8.3* 8.6*  MG  --   --   --  1.9  PHOS  --   --   --  2.7     GFR: CrCl cannot be calculated (Unknown ideal weight.).  Liver Function Tests: Recent Labs  Lab 09/01/21 1226 09/02/21 0107  AST 57* 56*  ALT 31 29  ALKPHOS 73 66  BILITOT 0.4 0.9  PROT 7.0 7.1  ALBUMIN 3.4* 3.4*    Recent Labs  Lab 09/01/21 1226  LIPASE 42    Recent Labs  Lab 09/01/21 1545  AMMONIA 30     Coagulation Profile: No results for input(s): INR, PROTIME in the last 168 hours.  Cardiac Enzymes: No results for input(s): CKTOTAL, CKMB, CKMBINDEX, TROPONINI in the last 168 hours.  BNP (last 3 results) No results for input(s): PROBNP in the last 8760 hours.  Lipid Profile: No results for input(s): CHOL, HDL, LDLCALC, TRIG, CHOLHDL, LDLDIRECT in the last 72 hours.  Thyroid Function Tests: No results for input(s): TSH, T4TOTAL, FREET4, T3FREE, THYROIDAB in the last 72 hours.  Anemia Panel: No results for input(s): VITAMINB12, FOLATE, FERRITIN, TIBC, IRON, RETICCTPCT in the last 72 hours.  Urine analysis:    Component Value Date/Time   COLORURINE YELLOW 11/11/2020 1604   APPEARANCEUR CLEAR 11/11/2020 1604   LABSPEC 1.014 11/11/2020 1604   PHURINE 5.0 11/11/2020 1604    GLUCOSEU NEGATIVE 11/11/2020 1604   HGBUR MODERATE (A) 11/11/2020 1604   BILIRUBINUR NEGATIVE 11/11/2020 1604   KETONESUR NEGATIVE 11/11/2020 1604   PROTEINUR 30 (A) 11/11/2020 1604   NITRITE NEGATIVE 11/11/2020 1604   LEUKOCYTESUR NEGATIVE 11/11/2020 1604    Sepsis Labs: Lactic Acid, Venous No results found for: LATICACIDVEN  MICROBIOLOGY: No results found for this or  any previous visit (from the past 240 hour(s)).  RADIOLOGY STUDIES/RESULTS: CT Head Wo Contrast  Result Date: 09/01/2021 CLINICAL DATA:  Polytrauma, blunt EXAM: CT HEAD WITHOUT CONTRAST CT CERVICAL SPINE WITHOUT CONTRAST TECHNIQUE: Multidetector CT imaging of the head and cervical spine was performed following the standard protocol without intravenous contrast. Multiplanar CT image reconstructions of the cervical spine were also generated. RADIATION DOSE REDUCTION: This exam was performed according to the departmental dose-optimization program which includes automated exposure control, adjustment of the mA and/or kV according to patient size and/or use of iterative reconstruction technique. COMPARISON:  CT head 11/25/2020. FINDINGS: CT HEAD FINDINGS Brain: Similar old infarct with encephalomalacia in the right temporoparietal region. Remote left cerebellar lacunar infarct. No evidence of acute large vascular territory infarct. Vascular: No hyperdense vessel identified. Calcific intracranial atherosclerosis Skull: No acute fracture. Sinuses/Orbits: Moderate mucosal thickening of the right maxillary sinus and left sphenoid sinus with frothy secretions. No acute orbital findings. Other: No mastoid effusions. CT CERVICAL SPINE FINDINGS Motion limited. Alignment: Straightening of normal cervical lordosis. No substantial sagittal subluxation. Mild rotation of C1 on C2, probably positional. Skull base and vertebrae: Vertebral body heights are maintained. No evidence of acute fracture. Soft tissues and spinal canal: No prevertebral fluid or  swelling. No visible canal hematoma. 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 Upper chest: Visualized lung apices are clear. IMPRESSION: CT head: 1. No evidence of acute intracranial abnormality. 2. Remote infarcts and chronic microvascular disease. CT cervical spine: 1. No evidence of acute fracture or traumatic malalignment. 2. Degenerative changes, detailed above. Electronically Signed   By: Feliberto Harts M.D.   On: 09/01/2021 13:30   CT Cervical Spine Wo Contrast  Result Date: 09/01/2021 CLINICAL DATA:  Polytrauma, blunt EXAM: CT HEAD WITHOUT CONTRAST CT CERVICAL SPINE WITHOUT CONTRAST TECHNIQUE: Multidetector CT imaging of the head and cervical spine was performed following the standard protocol without intravenous contrast. Multiplanar CT image reconstructions of the cervical spine were also generated. RADIATION DOSE REDUCTION: This exam was performed according to the departmental dose-optimization program which includes automated exposure control, adjustment of the mA and/or kV according to patient size and/or use of iterative reconstruction technique. COMPARISON:  CT head 11/25/2020. FINDINGS: CT HEAD FINDINGS Brain: Similar old infarct with encephalomalacia in the right temporoparietal region. Remote left cerebellar lacunar infarct. No evidence of acute large vascular territory infarct. Vascular: No hyperdense vessel identified. Calcific intracranial atherosclerosis Skull: No acute fracture. Sinuses/Orbits: Moderate mucosal thickening of the right maxillary sinus and left sphenoid sinus with frothy secretions. No acute orbital findings. Other: No mastoid effusions. CT CERVICAL SPINE FINDINGS Motion limited. Alignment: Straightening of normal cervical lordosis. No substantial sagittal subluxation. Mild rotation of C1 on C2, probably positional. Skull base and vertebrae: Vertebral body heights are  maintained. No evidence of acute fracture. Soft tissues and spinal canal: No prevertebral fluid or swelling. No visible canal hematoma. 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 Upper chest: Visualized lung apices are clear. IMPRESSION: CT head: 1. No evidence of acute intracranial abnormality. 2. Remote infarcts and chronic microvascular disease. CT cervical spine: 1. No evidence of acute fracture or traumatic malalignment. 2. Degenerative changes, detailed above. Electronically Signed   By: Feliberto Harts M.D.   On: 09/01/2021 13:30   DG Chest Port 1 View  Result Date: 09/01/2021 CLINICAL DATA:  Seizure. EXAM: PORTABLE CHEST 1 VIEW COMPARISON:  Chest radiograph 11/26/2020 FINDINGS: No consolidation. No visible pleural effusions or pneumothorax. Cardiomediastinal silhouette is within normal limits. No displaced fracture. IMPRESSION: No evidence of acute cardiopulmonary disease. Electronically Signed   By: Feliberto HartsFrederick S Jones M.D.   On: 09/01/2021 14:22   EEG adult  Result Date: 09/02/2021 Charlsie QuestYadav, Priyanka O, MD     09/02/2021  2:04 PM Patient Name: Levan HurstRichard K Sirico MRN: [id number] Epilepsy Attending: Charlsie QuestPriyanka O Yadav Referring Physician/Provider: Rozelle LoganHorton, Kristie M, DO Date: 09/02/2021 Duration: 21.34 mins Patient history: 66 year old male with history of seizures, alcohol use who presented with breakthrough seizures.  EEG to evaluate for seizure. Level of alertness: Awake, asleep AEDs during EEG study: Ativan Technical aspects: This EEG study was done with scalp electrodes positioned according to the 10-20 International system of electrode placement. Electrical activity was acquired at a sampling rate of 500Hz  and reviewed with a high frequency filter of 70Hz  and a low frequency filter of 1Hz . EEG data were recorded continuously and digitally stored. Description: The posterior dominant rhythm consists of 8-9  Hz activity of moderate voltage (25-35 uV) seen predominantly in posterior head regions, symmetric and reactive to eye opening and eye closing. Sleep was characterized by vertex waves, sleep spindles (12 to 14 Hz), maximal frontocentral region. EEG showed intermittent generalized  3 to 6 Hz theta-delta slowing in right temporal region. Hyperventilation and photic stimulation were not performed.   ABNORMALITY - Intermittent slow, right temporal region IMPRESSION: This study is suggestive of cortical dysfunction in right temporal region likely secondary to underlying stroke.  No seizures or epileptiform discharges were seen throughout the recording. Priyanka Annabelle Harman Yadav     LOS: 1 day   Jeoffrey MassedShanker Tametra Ahart, MD  [facility]    To contact the attending provider between 7A-7P or the covering provider during after hours 7P-7A, please log into the web site [email or web address] and access using universal [place] password for that web site. If you do not have the password, please call the hospital operator.  09/03/2021, 11:53 AM

## 2021-09-03 NOTE — Progress Notes (Signed)
PHARMACIST - PHYSICIAN COMMUNICATION  DR:   Jerral Ralph  CONCERNING: IV to Oral Route Change Policy  RECOMMENDATION: This patient is receiving Keppra by the intravenous route.  Based on criteria approved by the Pharmacy and Therapeutics Committee, the intravenous medication(s) is/are being converted to the equivalent oral dose form(s).   DESCRIPTION: These criteria include: The patient is eating (either orally or via tube) and/or has been taking other orally administered medications for a least 24 hours The patient has no evidence of active gastrointestinal bleeding or impaired GI absorption (gastrectomy, short bowel, patient on TNA or NPO).  If you have questions about this conversion, please contact the Pharmacy Department  []   [phone] )  ( [phone] []   [phone] )  [facility] [x]   [phone] )  [facility] []   [phone] )  [facility] []   [phone] )  [facility]   [place], PharmD, [place], AAHIVP, CPP Infectious Disease Pharmacist 09/03/2021 2:33 PM

## 2021-09-04 ENCOUNTER — Inpatient Hospital Stay (HOSPITAL_COMMUNITY): Payer: Medicare Other

## 2021-09-04 DIAGNOSIS — F1010 Alcohol abuse, uncomplicated: Secondary | ICD-10-CM | POA: Diagnosis not present

## 2021-09-04 DIAGNOSIS — I10 Essential (primary) hypertension: Secondary | ICD-10-CM | POA: Diagnosis not present

## 2021-09-04 DIAGNOSIS — Z8673 Personal history of transient ischemic attack (TIA), and cerebral infarction without residual deficits: Secondary | ICD-10-CM | POA: Diagnosis not present

## 2021-09-04 DIAGNOSIS — R569 Unspecified convulsions: Secondary | ICD-10-CM | POA: Diagnosis not present

## 2021-09-04 LAB — BASIC METABOLIC PANEL
Anion gap: 9 (ref 5–15)
BUN: 8 mg/dL (ref 8–23)
CO2: 24 mmol/L (ref 22–32)
Calcium: 9.1 mg/dL (ref 8.9–10.3)
Chloride: 103 mmol/L (ref 98–111)
Creatinine, Ser: 1.05 mg/dL (ref 0.61–1.24)
GFR, Estimated: >60 mL/min (ref >60)
Glucose, Bld: 107 mg/dL — ABNORMAL HIGH (ref 70–99)
Potassium: 4.1 mmol/L (ref 3.5–5.1)
Sodium: 136 mmol/L (ref 135–145)

## 2021-09-04 LAB — PHOSPHORUS: Phosphorus: 3.2 mg/dL (ref 2.5–4.6)

## 2021-09-04 LAB — MAGNESIUM: Magnesium: 1.8 mg/dL (ref 1.7–2.4)

## 2021-09-04 MED ORDER — MELATONIN 3 MG PO TABS
3.0000 mg | ORAL_TABLET | Freq: Every evening | ORAL | Status: DC | PRN
  Administered 2021-09-04: 3 mg via ORAL
  Filled 2021-09-04: qty 1

## 2021-09-04 MED ORDER — LORAZEPAM 2 MG/ML IJ SOLN
1.0000 mg | INTRAMUSCULAR | Status: DC | PRN
  Administered 2021-09-05: 1 mg via INTRAVENOUS
  Administered 2021-09-05: 2 mg via INTRAVENOUS
  Filled 2021-09-04: qty 1

## 2021-09-04 MED ORDER — LORAZEPAM 1 MG PO TABS
1.0000 mg | ORAL_TABLET | ORAL | Status: DC | PRN
  Administered 2021-09-06: 2 mg via ORAL
  Filled 2021-09-04: qty 2

## 2021-09-04 MED ORDER — MAGNESIUM SULFATE IN D5W 1-5 GM/100ML-% IV SOLN
1.0000 g | Freq: Once | INTRAVENOUS | Status: AC
  Administered 2021-09-04: 1 g via INTRAVENOUS
  Filled 2021-09-04: qty 100

## 2021-09-04 NOTE — Progress Notes (Signed)
   09/04/21 1808  What Happened  Was fall witnessed? No  Who witnessed fall? N/A  Patients activity before fall other (comment) (up in bed)  Point of contact head;buttocks;arm/shoulder;hip/leg;back  Was patient injured? No  Patient found on floor  Found by Staff-comment  Stated prior activity other (comment) (in bed)  Follow Up  MD notified S. Ghimire  Time MD notified 64  Family notified Yes - comment (attempted to call no answer)  Time family notified 1825  Additional tests Yes-comment (stat head ct)  Simple treatment Other (comment) (n/a)  Progress note created (see row info) Yes  Adult Fall Risk Assessment  Risk Factor Category (scoring not indicated) High fall risk per protocol (document High fall risk)  Patient Fall Risk Level High fall risk  Adult Fall Risk Interventions  Required Bundle Interventions *See Row Information* High fall risk - low, moderate, and high requirements implemented  Additional Interventions Room near nurses station  Screening for Fall Injury Risk (To be completed on HIGH fall risk patients) - Assessing Need for Floor Mats  Risk For Fall Injury- Criteria for Floor Mats Confusion/dementia (+NuDESC, CIWA, TBI, etc.)  Will Implement Floor Mats Yes  Vitals  BP (!) 165/93  MAP (mmHg) 114  BP Location Left Arm  BP Method Automatic  Patient Position (if appropriate) Lying  Pulse Rate (!) 104  Pulse Rate Source Monitor  ECG Heart Rate (!) 106  Cardiac Rhythm ST  Resp 20  Oxygen Therapy  SpO2 91 %  O2 Device Room Air  Pain Assessment  Pain Scale 0-10  Pain Score 0  PCA/Epidural/Spinal Assessment  Respiratory Pattern Regular;Unlabored  Neurological  Neuro (WDL) X  Level of Consciousness Alert  Orientation Level Oriented to person;Oriented to place;Disoriented to time;Disoriented to situation  Cognition Impulsive;Poor attention/concentration;Poor judgement;Poor safety awareness  Seizure Activity  Interventions Head Protection;Seizure  Pads;Side-lying Position  Glasgow Coma Scale  Eye Opening 4  Best Verbal Response (NON-intubated) 5  Best Motor Response 6  Glasgow Coma Scale Score 15  Musculoskeletal  Musculoskeletal (WDL) X  Assistive Device None  Generalized Weakness Yes  Weight Bearing Restrictions No  Musculoskeletal Details  RLE BKA  Integumentary  Integumentary (WDL) X  Skin Color Appropriate for ethnicity  Skin Condition Dry  Skin Integrity Other (Comment) (see LDA for wound)  Skin Turgor Non-tenting

## 2021-09-04 NOTE — Progress Notes (Incomplete)
Staff notified Charge RN of patient being on floor. Charge RN notified MD Ghimire, and STAT head CT  was ordered. Pt assese

## 2021-09-04 NOTE — Progress Notes (Signed)
Occupational Therapy Treatment Patient Details Name: Dominic Carter MRN: [id number] DOB: March 10, 1956 Today's Date: 09/04/2021   History of present illness 66 yo male admitted 5/28 with seizure. PMhx: seizure disorder, ETOH use, HTN, HLD, RT BKA, CVA   OT comments  Pt restless and wanting to "speak to management" about when he was getting out of the hospital. Pt satisfied to transfer to manual w/c and propel himself throughout unit. Pt requiring min guard assist for lateral scoot to and from w/c. Remains appropriate for SNF level rehab.    Recommendations for follow up therapy are one component of a multi-disciplinary discharge planning process, led by the attending physician.  Recommendations may be updated based on patient status, additional functional criteria and insurance authorization.    Follow Up Recommendations  Skilled nursing-short term rehab (<3 hours/day)    Assistance Recommended at Discharge Frequent or constant Supervision/Assistance  Patient can return home with the following  A little help with walking and/or transfers;Assistance with cooking/housework;Direct supervision/assist for medications management;Direct supervision/assist for financial management;Assist for transportation;Help with stairs or ramp for entrance;A little help with bathing/dressing/bathroom   Equipment Recommendations  None recommended by OT    Recommendations for Other Services      Precautions / Restrictions Precautions Precautions: Fall Precaution Comments: Rt BKA with contracture and no prosthesis Restrictions Weight Bearing Restrictions: No       Mobility Bed Mobility Overal bed mobility: Needs Assistance Bed Mobility: Supine to Sit, Sit to Supine     Supine to sit: Supervision Sit to supine: Supervision   General bed mobility comments: for lines and safety    Transfers Overall transfer level: Needs assistance   Transfers: Bed to chair/wheelchair/BSC             Lateral/Scoot Transfers: Min guard General transfer comment: bed<>w/c     Balance Overall balance assessment: Needs assistance   Sitting balance-Leahy Scale: Good                                     ADL either performed or assessed with clinical judgement   ADL                   Upper Body Dressing : Set up;Sitting                          Extremity/Trunk Assessment              Vision       Perception     Praxis      Cognition Arousal/Alertness: Awake/alert Behavior During Therapy: Flat affect, Impulsive Overall Cognitive Status: Impaired/Different from baseline Area of Impairment: Attention, Following commands, Safety/judgement, Awareness, Problem solving, Orientation, Memory                 Orientation Level: Time, Situation, Place Current Attention Level: Sustained Memory: Decreased short-term memory, Decreased recall of precautions Following Commands: Follows one step commands inconsistently, Follows one step commands with increased time Safety/Judgement: Decreased awareness of safety, Decreased awareness of deficits Awareness: Intellectual Problem Solving: Slow processing, Difficulty sequencing, Decreased initiation, Requires verbal cues, Requires tactile cues          Exercises      Shoulder Instructions       General Comments propelled w/c independently, max assist to use brakes    Pertinent Vitals/ Pain       Pain  Assessment Pain Assessment: Faces Faces Pain Scale: No hurt Pain Intervention(s): Monitored during session  Home Living                                          Prior Functioning/Environment              Frequency  Min 2X/week        Progress Toward Goals  OT Goals(current goals can now be found in the care plan section)  Progress towards OT goals: Progressing toward goals  Acute Rehab OT Goals OT Goal Formulation: Patient unable to participate in goal  setting Time For Goal Achievement: 09/16/21 Potential to Achieve Goals: Good  Plan Discharge plan remains appropriate    Co-evaluation                 AM-PAC OT "6 Clicks" Daily Activity     Outcome Measure   Help from another person eating meals?: None Help from another person taking care of personal grooming?: A Little Help from another person toileting, which includes using toliet, bedpan, or urinal?: A Lot Help from another person bathing (including washing, rinsing, drying)?: A Lot Help from another person to put on and taking off regular upper body clothing?: A Little Help from another person to put on and taking off regular lower body clothing?: A Lot 6 Click Score: 16    End of Session    OT Visit Diagnosis: Unsteadiness on feet (R26.81);Other abnormalities of gait and mobility (R26.89);Muscle weakness (generalized) (M62.81);Other symptoms and signs involving cognitive function   Activity Tolerance Patient tolerated treatment well   Patient Left in bed;with call bell/phone within reach;with nursing/sitter in room   Nurse Communication          Time: 1420-1446 OT Time Calculation (min): 26 min  Charges: OT General Charges $OT Visit: 1 Visit OT Treatments $Therapeutic Activity: 23-37 mins  Martie Round, OTR/L Acute Rehabilitation Services Pager: [phone] Office: [phone]   Evern Bio 09/04/2021, 3:29 PM

## 2021-09-04 NOTE — TOC Progression Note (Addendum)
Transition of Care [facility]) - Progression Note    Patient Details  Name: Dominic Carter MRN: [id number] Date of Birth: 11-23-1955  [facility]) CM/SW [place], LCSW Phone Number: 09/04/2021, 3:36 PM  Clinical Narrative:    3:36pm-CSW contacted patient's niece to provide SNF bed offers but voicemail is full. CSW sent HIPPA compliant text requesting the best method (I.e. email) to provide her with bed offers. CSW started insurance authorization pending a facility choice, Ref# [id number].  4pm-Niece returned call and provided email address ([email or web address]). She will review offers and let CSW know.    Expected Discharge Plan: [place] Barriers to Discharge: Continued Medical Work up, Ship broker, SNF Pending bed offer  Expected Discharge Plan and Services Expected Discharge Plan: [place] In-house Referral: Clinical Social Work   Post Acute Care Choice: [place] Living arrangements for the past 2 months: Single Family Home                                       Social Determinants of Health (SDOH) Interventions    Readmission Risk Interventions     View : No data to display.

## 2021-09-04 NOTE — Progress Notes (Signed)
PROGRESS NOTE        PATIENT DETAILS Name: Dominic Carter Age: 66 y.o. Sex: male Date of Birth: 30-May-1955 Admit Date: 09/01/2021 Admitting Physician Evalee Mutton Kristeen Mans, MD [id number], Apolonio Schneiders, NP  Brief Summary: Patient is a 65 y.o.  male with history of right BKA, EtOH use, seizure disorder-who presented with breakthrough seizures.  Significant events: 5/28>> admit to [facility]-breakthrough seizures  Significant studies: 5/28>> CT head: No acute intracranial abnormality 5/28>> CT C-spine: No fracture/dislocation 5/28>> CXR: No acute cardiopulmonary disease 5/29>> EEG: No seizures.  Significant microbiology data:   Procedures:   Consults: None   Subjective: Sleepy this morning but answers questions appropriately-had some delirium last night.  Objective: Vitals: Blood pressure 128/81, pulse (!) 111, temperature 98.4 F (36.9 C), temperature source Axillary, resp. rate (!) 22, SpO2 100 %.   Exam: Gen Exam:Alert awake-not in any distress HEENT:atraumatic, normocephalic Chest: B/L clear to auscultation anteriorly CVS:S1S2 regular Abdomen:soft non tender, non distended Extremities:no edema Neurology: Non focal Skin: no rash   Pertinent Labs/Radiology:    Latest Ref Rng & Units 09/02/2021    1:07 AM 09/01/2021    3:52 PM 09/01/2021   12:26 PM  CBC  WBC 4.0 - 10.5 K/uL 8.6    4.8    Hemoglobin 13.0 - 17.0 g/dL 12.2   12.9   12.4    Hematocrit 39.0 - 52.0 % 37.4   38.0   38.4    Platelets 150 - 400 K/uL 127    153      Lab Results  Component Value Date   NA 136 09/04/2021   K 4.1 09/04/2021   CL 103 09/04/2021   CO2 24 09/04/2021       Assessment/Plan: Breakthrough seizures: In the setting of ongoing alcohol use and possible noncompliance with Keppra.  Continue Keppra-EEG negative for seizures.    EtOH use/EtOH withdrawal: Continues to have withdrawal symptoms-confused last night-remains on Ativan per CIWA protocol.  Relatively awake  and alert this morning-continue supportive care.  Hypokalemia: Repleted.  HTN: BP stable on amlodipine.  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  May need plastic referral as an outpatient.  History of CVA  History of right BKA  Tobacco abuse: On transdermal nicotine.  BMI: Estimated body mass index is 24.35 kg/m as calculated from the following:   Height as of 08/10/21: 5\' 11"  (1.803 m).   Weight as of 08/10/21: 79.2 kg.   Code status:   Code Status: Full Code   DVT Prophylaxis: enoxaparin (LOVENOX) injection 40 mg Start: 09/01/21 1800   Family Communication: Niece-Brittany-[phone]- on 5/29 (she called back)-left a voicemail on 5/30  Disposition Plan: Status is: Observation The patient will require care spanning > 2 midnights and should be moved to inpatient because: Seizure disorder-awaiting EEG-alcohol withdrawal-noted stable for discharge.   Planned Discharge Destination: SNF when out of withdrawal symptoms.   Diet: Diet Order             Diet regular Room service appropriate? No; Fluid consistency: Thin  Diet effective now                     Antimicrobial agents: Anti-infectives (From admission, onward)    None        MEDICATIONS: Scheduled Meds:  amLODipine  10 mg Oral  Daily   enoxaparin (LOVENOX) injection  40 mg Subcutaneous [id number]   folic acid  1 mg Oral Daily   levETIRAcetam  500 mg Oral BID   LORazepam  0-4 mg Intravenous Q12H   Or   LORazepam  0-4 mg Oral Q12H   multivitamin with minerals  1 tablet Oral Daily   nicotine  21 mg Transdermal Daily   sodium chloride flush  3 mL Intravenous Q12H   thiamine  100 mg Oral Daily   Or   thiamine  100 mg Intravenous Daily   Continuous Infusions:  magnesium sulfate bolus IVPB 1 g (09/04/21 0949)   PRN Meds:.acetaminophen **OR** acetaminophen, albuterol, LORazepam **OR** LORazepam, ondansetron **OR** ondansetron  (ZOFRAN) IV   I have personally reviewed following labs and imaging studies  LABORATORY DATA: CBC: Recent Labs  Lab 09/01/21 1226 09/01/21 1552 09/02/21 0107  WBC 4.8  --  8.6  NEUTROABS 3.1  --   --   HGB 12.4* 12.9* 12.2*  HCT 38.4* 38.0* 37.4*  MCV 89.5  --  87.6  PLT 153  --  127*     Basic Metabolic Panel: Recent Labs  Lab 09/01/21 1226 09/01/21 1552 09/02/21 0107 09/03/21 0212 09/04/21 0116  NA 142 143 137 137 136  K 3.8 3.9 3.6 3.2* 4.1  CL 107  --  103 102 103  CO2 20*  --  24 24 24   GLUCOSE 115*  --  80 79 107*  BUN 5*  --  6* 5* 8  CREATININE 1.00  --  1.09 0.93 1.05  CALCIUM 8.4*  --  8.3* 8.6* 9.1  MG  --   --   --  1.9 1.8  PHOS  --   --   --  2.7 3.2     GFR: CrCl cannot be calculated (Unknown ideal weight.).  Liver Function Tests: Recent Labs  Lab 09/01/21 1226 09/02/21 0107  AST 57* 56*  ALT 31 29  ALKPHOS 73 66  BILITOT 0.4 0.9  PROT 7.0 7.1  ALBUMIN 3.4* 3.4*    Recent Labs  Lab 09/01/21 1226  LIPASE 42    Recent Labs  Lab 09/01/21 1545  AMMONIA 30     Coagulation Profile: No results for input(s): INR, PROTIME in the last 168 hours.  Cardiac Enzymes: No results for input(s): CKTOTAL, CKMB, CKMBINDEX, TROPONINI in the last 168 hours.  BNP (last 3 results) No results for input(s): PROBNP in the last 8760 hours.  Lipid Profile: No results for input(s): CHOL, HDL, LDLCALC, TRIG, CHOLHDL, LDLDIRECT in the last 72 hours.  Thyroid Function Tests: No results for input(s): TSH, T4TOTAL, FREET4, T3FREE, THYROIDAB in the last 72 hours.  Anemia Panel: No results for input(s): VITAMINB12, FOLATE, FERRITIN, TIBC, IRON, RETICCTPCT in the last 72 hours.  Urine analysis:    Component Value Date/Time   COLORURINE YELLOW 11/11/2020 [place] 11/11/2020 1604   LABSPEC 1.014 11/11/2020 1604   PHURINE 5.0 11/11/2020 1604   GLUCOSEU NEGATIVE 11/11/2020 1604   HGBUR MODERATE (A) 11/11/2020 1604   BILIRUBINUR  NEGATIVE 11/11/2020 [place] 11/11/2020 1604   PROTEINUR 30 (A) 11/11/2020 1604   NITRITE NEGATIVE 11/11/2020 1604   LEUKOCYTESUR NEGATIVE 11/11/2020 1604    Sepsis Labs: Lactic Acid, Venous No results found for: LATICACIDVEN  MICROBIOLOGY: No results found for this or any previous visit (from the past 240 hour(s)).  RADIOLOGY STUDIES/RESULTS: EEG adult  Result Date: 09/02/2021 Lora Havens, MD  09/02/2021  2:04 PM Patient Name: Dominic Carter MRN: [id number] Epilepsy Attending: Lora Havens Referring Physician/Provider: Lorelle Gibbs, DO Date: 09/02/2021 Duration: 21.34 mins Patient history: 66 year old male with history of seizures, alcohol use who presented with breakthrough seizures.  EEG to evaluate for seizure. Level of alertness: Awake, asleep AEDs during EEG study: Ativan Technical aspects: This EEG study was done with scalp electrodes positioned according to the 10-20 International system of electrode placement. Electrical activity was acquired at a sampling rate of 500Hz  and reviewed with a high frequency filter of 70Hz  and a low frequency filter of 1Hz . EEG data were recorded continuously and digitally stored. Description: The posterior dominant rhythm consists of 8-9 Hz activity of moderate voltage (25-35 uV) seen predominantly in posterior head regions, symmetric and reactive to eye opening and eye closing. Sleep was characterized by vertex waves, sleep spindles (12 to 14 Hz), maximal frontocentral region. EEG showed intermittent generalized  3 to 6 Hz theta-delta slowing in right temporal region. Hyperventilation and photic stimulation were not performed.   ABNORMALITY - Intermittent slow, right temporal region IMPRESSION: This study is suggestive of cortical dysfunction in right temporal region likely secondary to underlying stroke.  No seizures or epileptiform discharges were seen throughout the recording. Priyanka Barbra Sarks     LOS: 2 days    Oren Binet, MD  [facility]    To contact the attending provider between 7A-7P or the covering provider during after hours 7P-7A, please log into the web site [email or web address] and access using universal [place] password for that web site. If you do not have the password, please call the hospital operator.  09/04/2021, 10:15 AM

## 2021-09-04 NOTE — TOC Progression Note (Signed)
Transition of Care [facility]) - Progression Note    Patient Details  Name: Dominic Carter MRN: [id number] Date of Birth: May 08, 1955  [facility]) CM/SW Contact  Mearl Latin, LCSW Phone Number: 09/04/2021, 9:08 AM  Clinical Narrative:    [name] has improved. CSW sent referrals to SNF for review and will present bed offers to patient's niece as available.    Expected Discharge Plan: Skilled Nursing Facility Barriers to Discharge: Continued Medical Work up, English as a second language teacher, SNF Pending bed offer  Expected Discharge Plan and Services Expected Discharge Plan: Skilled Nursing Facility In-house Referral: Clinical Social Work   Post Acute Care Choice: Skilled Nursing Facility Living arrangements for the past 2 months: Single Family Home                                       Social Determinants of Health (SDOH) Interventions    Readmission Risk Interventions     View : No data to display.

## 2021-09-04 NOTE — Plan of Care (Signed)
  Problem: Education: Goal: Knowledge of General Education information will improve Description Including pain rating scale, medication(s)/side effects and non-pharmacologic comfort measures Outcome: Progressing   Problem: Health Behavior/Discharge Planning: Goal: Ability to manage health-related needs will improve Outcome: Progressing   

## 2021-09-05 DIAGNOSIS — Z8673 Personal history of transient ischemic attack (TIA), and cerebral infarction without residual deficits: Secondary | ICD-10-CM | POA: Diagnosis not present

## 2021-09-05 DIAGNOSIS — F1010 Alcohol abuse, uncomplicated: Secondary | ICD-10-CM | POA: Diagnosis not present

## 2021-09-05 DIAGNOSIS — R569 Unspecified convulsions: Secondary | ICD-10-CM | POA: Diagnosis not present

## 2021-09-05 DIAGNOSIS — I10 Essential (primary) hypertension: Secondary | ICD-10-CM | POA: Diagnosis not present

## 2021-09-05 LAB — PHOSPHORUS: Phosphorus: 4 mg/dL (ref 2.5–4.6)

## 2021-09-05 LAB — BASIC METABOLIC PANEL
Anion gap: 8 (ref 5–15)
BUN: 7 mg/dL — ABNORMAL LOW (ref 8–23)
CO2: 24 mmol/L (ref 22–32)
Calcium: 8.9 mg/dL (ref 8.9–10.3)
Chloride: 103 mmol/L (ref 98–111)
Creatinine, Ser: 0.79 mg/dL (ref 0.61–1.24)
GFR, Estimated: >60 mL/min (ref >60)
Glucose, Bld: 105 mg/dL — ABNORMAL HIGH (ref 70–99)
Potassium: 3.4 mmol/L — ABNORMAL LOW (ref 3.5–5.1)
Sodium: 135 mmol/L (ref 135–145)

## 2021-09-05 LAB — MAGNESIUM: Magnesium: 2 mg/dL (ref 1.7–2.4)

## 2021-09-05 MED ORDER — HALOPERIDOL LACTATE 5 MG/ML IJ SOLN
5.0000 mg | Freq: Once | INTRAMUSCULAR | Status: DC | PRN
Start: 2021-09-05 — End: 2021-09-07

## 2021-09-05 MED ORDER — QUETIAPINE FUMARATE 25 MG PO TABS
25.0000 mg | ORAL_TABLET | Freq: Every evening | ORAL | Status: DC | PRN
  Administered 2021-09-05: 25 mg via ORAL
  Filled 2021-09-05: qty 1

## 2021-09-05 MED ORDER — MELATONIN 5 MG PO TABS
5.0000 mg | ORAL_TABLET | Freq: Every day | ORAL | Status: DC
  Administered 2021-09-05 – 2021-09-06 (×2): 5 mg via ORAL
  Filled 2021-09-05 (×2): qty 1

## 2021-09-05 MED ORDER — POTASSIUM CHLORIDE CRYS ER 20 MEQ PO TBCR
40.0000 meq | EXTENDED_RELEASE_TABLET | ORAL | Status: AC
  Administered 2021-09-05 (×2): 40 meq via ORAL
  Filled 2021-09-05 (×2): qty 2

## 2021-09-05 MED ORDER — SALINE SPRAY 0.65 % NA SOLN
1.0000 | NASAL | Status: DC | PRN
  Filled 2021-09-05: qty 44

## 2021-09-05 NOTE — Progress Notes (Signed)
Physical Therapy Treatment Patient Details Name: Dominic Carter MRN: [id number] DOB: [birth year] Today's Date: 09/05/2021   History of Present Illness 66 yo male admitted 5/28 with seizure. PMhx: seizure disorder, ETOH use, HTN, HLD, RT BKA, CVA    PT Comments    Patient remains moderately confused, but able to follow PT instructions and not impulsive during session. (Had a fall overnight and now has a Comptroller). Able to transfer OOB with RW and min assist and reinforced need to have someone with him if he wants to transfer back to bed. Followed all cues for exercises.     Recommendations for follow up therapy are one component of a multi-disciplinary discharge planning process, led by the attending physician.  Recommendations may be updated based on patient status, additional functional criteria and insurance authorization.  Follow Up Recommendations  Skilled nursing-short term rehab (<3 hours/day)     Assistance Recommended at Discharge Frequent or constant Supervision/Assistance  Patient can return home with the following A little help with walking and/or transfers;A little help with bathing/dressing/bathroom;Assistance with cooking/housework;Direct supervision/assist for financial management;Assist for transportation;Help with stairs or ramp for entrance;Direct supervision/assist for medications management   Equipment Recommendations  None recommended by PT    Recommendations for Other Services       Precautions / Restrictions Precautions Precautions: Fall Precaution Comments: Rt BKA and no prosthesis Restrictions Weight Bearing Restrictions: No     Mobility  Bed Mobility Overal bed mobility: Needs Assistance Bed Mobility: Supine to Sit     Supine to sit: Supervision     General bed mobility comments: for lines and safety due to posterior bias    Transfers Overall transfer level: Needs assistance Equipment used: Rolling walker (2 wheels) Transfers: Bed to  chair/wheelchair/BSC, Sit to/from Stand Sit to Stand: Min assist   Step pivot transfers: Min assist       General transfer comment: bed to recliner; pt mostly swivels on left foot instead of hopping    Ambulation/Gait               General Gait Details: pt unable   Stairs             Wheelchair Mobility    Modified Rankin (Stroke Patients Only)       Balance Overall balance assessment: Needs assistance Sitting-balance support: No upper extremity supported, Feet unsupported Sitting balance-Leahy Scale: Fair Sitting balance - Comments: initial posterior bias when feet not on the floor     Standing balance-Leahy Scale: Poor Standing balance comment: bil UE support and min physical assist                            Cognition Arousal/Alertness: Awake/alert Behavior During Therapy: Flat affect Overall Cognitive Status: No family/caregiver present to determine baseline cognitive functioning Area of Impairment: Attention, Following commands, Safety/judgement, Awareness, Problem solving, Orientation, Memory                 Orientation Level: Time, Situation, Place Current Attention Level: Sustained Memory: Decreased short-term memory, Decreased recall of precautions Following Commands: Follows one step commands inconsistently, Follows one step commands with increased time Safety/Judgement: Decreased awareness of safety, Decreased awareness of deficits Awareness: Intellectual Problem Solving: Slow processing, Difficulty sequencing, Decreased initiation, Requires verbal cues, Requires tactile cues General Comments: asked pt about his fall last night and he described a fall when he was outside, fell out of w/c, and cut his head on a fence; not  oriented to day or place; not impulsive today        Exercises Amputee Exercises Hip Extension: PROM, Sidelying (hip stretches to neutral) Knee Extension: AROM, Right, 5 reps, Seated Straight Leg Raises:  AROM, Both, 10 reps, Supine Chair Push Up: AROM, Both, 10 reps Other Exercises Other Exercises: bridging x 10 reps    General Comments General comments (skin integrity, edema, etc.): pt uses rt knee to prop on in seat of the chair as he gets close enough (during transfer)      Pertinent Vitals/Pain Pain Assessment Pain Assessment: No/denies pain Faces Pain Scale: No hurt    Home Living                          Prior Function            PT Goals (current goals can now be found in the care plan section) Acute Rehab PT Goals Time For Goal Achievement: 09/16/21 Potential to Achieve Goals: Fair Progress towards PT goals: Progressing toward goals    Frequency    Min 2X/week      PT Plan Current plan remains appropriate    Co-evaluation              AM-PAC PT "6 Clicks" Mobility   Outcome Measure  Help needed turning from your back to your side while in a flat bed without using bedrails?: A Little Help needed moving from lying on your back to sitting on the side of a flat bed without using bedrails?: A Little Help needed moving to and from a bed to a chair (including a wheelchair)?: A Little Help needed standing up from a chair using your arms (e.g., wheelchair or bedside chair)?: A Little Help needed to walk in hospital room?: Total Help needed climbing 3-5 steps with a railing? : Total 6 Click Score: 14    End of Session Equipment Utilized During Treatment: Gait belt Activity Tolerance: Patient tolerated treatment well Patient left: in chair;with call bell/phone within reach;with chair alarm set;with nursing/sitter in room Nurse Communication: Mobility status PT Visit Diagnosis: Other abnormalities of gait and mobility (R26.89);Difficulty in walking, not elsewhere classified (R26.2);Muscle weakness (generalized) (M62.81);Repeated falls (R29.6);Unsteadiness on feet (R26.81)     Time: 1694-5038 PT Time Calculation (min) (ACUTE ONLY): 20  min  Charges:  $Therapeutic Exercise: 8-22 mins                      Jerolyn Center, PT Acute Rehabilitation Services  Pager [phone] Office [phone]    Zena Amos 09/05/2021, 2:36 PM

## 2021-09-05 NOTE — Progress Notes (Signed)
Patient agitated, attempting to leave the bed, removing equipment.   PRN dose of Ativan administered.

## 2021-09-05 NOTE — Progress Notes (Signed)
Per, Dr. Antionette Char, telemetry may be discontinued throughout the night to promote rest and decrease agitation.   Will resume once patient's agitation has decreased, and is compliant with intervention.

## 2021-09-05 NOTE — Plan of Care (Signed)
  Problem: Education: Goal: Knowledge of General Education information will improve Description Including pain rating scale, medication(s)/side effects and non-pharmacologic comfort measures Outcome: Progressing   Problem: Health Behavior/Discharge Planning: Goal: Ability to manage health-related needs will improve Outcome: Progressing   

## 2021-09-05 NOTE — Progress Notes (Signed)
PROGRESS NOTE        PATIENT DETAILS Name: Dominic HurstRichard K Carter Age: 66 y.o. Sex: male Date of Birth: 07/01/1955 Admit Date: 09/01/2021 Admitting Physician Dewayne ShorterShanker Levora DredgeM Zineb Glade, MD ZOX:WRUEAPCP:McCoy, Fleet Contrasachel, NP  Brief Summary: Patient is a 66 y.o.  male with history of right BKA, EtOH use, seizure disorder-who presented with breakthrough seizures.  Significant events: 5/28>> admit to [facility]-breakthrough seizures 5/31>> fall while delirious-CT head negative for acute abnormalities.  Significant studies: 5/28>> CT head: No acute intracranial abnormality 5/28>> CT C-spine: No fracture/dislocation 5/28>> CXR: No acute cardiopulmonary disease 5/29>> EEG: No seizures. 5/31>> CT head: No acute abnormalities.  Significant microbiology data:   Procedures:   Consults: None   Subjective: Much more awake and alert today.  Continues to have delirious episodes at night-but in the daytime he is calm and quiet.  Denies any pain in his joints.  Objective: Vitals: Blood pressure 109/77, pulse 82, temperature 98.5 F (36.9 C), temperature source Oral, resp. rate 20, SpO2 97 %.   Exam: Gen Exam:Alert awake-not in any distress HEENT:atraumatic, normocephalic Chest: B/L clear to auscultation anteriorly CVS:S1S2 regular Abdomen:soft non tender, non distended Extremities:no edema-s/p right BKA. Neurology: Non focal Skin: no rash   Pertinent Labs/Radiology:    Latest Ref Rng & Units 09/02/2021    1:07 AM 09/01/2021    3:52 PM 09/01/2021   12:26 PM  CBC  WBC 4.0 - 10.5 K/uL 8.6    4.8    Hemoglobin 13.0 - 17.0 g/dL 54.012.2   98.112.9   19.112.4    Hematocrit 39.0 - 52.0 % 37.4   38.0   38.4    Platelets 150 - 400 K/uL 127    153      Lab Results  Component Value Date   NA 135 09/05/2021   K 3.4 (L) 09/05/2021   CL 103 09/05/2021   CO2 24 09/05/2021       Assessment/Plan: Breakthrough seizures: In the setting of ongoing alcohol use and possible noncompliance with Keppra.   Continue Keppra-EEG negative for seizures.  No further seizures since admission.  EtOH use/EtOH withdrawal: Improving-continues to have delirium episodes at night-remains on Ativan per CIWA protocol.  He is much more awake and alert today compared to the past few days.  Mechanical fall on 5/31: No obvious injuries-CT head was unremarkable.  Bedside sitter in place.  Continue to maintain fall precautions as much as possible.  Hypokalemia: Replete and recheck.  HTN: BP stable on amlodipine.  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  May need plastic referral as an outpatient.  History of CVA  History of right BKA  Tobacco abuse: On transdermal nicotine.  BMI: Estimated body mass index is 24.35 kg/m as calculated from the following:   Height as of 08/10/21: 5\' 11"  (1.803 m).   Weight as of 08/10/21: 79.2 kg.   Code status:   Code Status: Full Code   DVT Prophylaxis: enoxaparin (LOVENOX) injection 40 mg Start: 09/01/21 1800   Family Communication: Niece-Brittany-[phone]-updated over the phone on 6/1  Disposition Plan: Status is: Observation The patient will require care spanning > 2 midnights and should be moved to inpatient because: Seizure disorder-awaiting EEG-alcohol withdrawal-noted stable for discharge.   Planned Discharge Destination: SNF when out of withdrawal symptoms.   Diet: Diet Order  Diet regular Room service appropriate? No; Fluid consistency: Thin  Diet effective now                     Antimicrobial agents: Anti-infectives (From admission, onward)    None        MEDICATIONS: Scheduled Meds:  amLODipine  10 mg Oral Daily   enoxaparin (LOVENOX) injection  40 mg Subcutaneous Q24H   folic acid  1 mg Oral Daily   levETIRAcetam  500 mg Oral BID   LORazepam  0-4 mg Intravenous Q12H   Or   LORazepam  0-4 mg Oral Q12H   multivitamin with minerals  1 tablet  Oral Daily   nicotine  21 mg Transdermal Daily   sodium chloride flush  3 mL Intravenous Q12H   thiamine  100 mg Oral Daily   Or   thiamine  100 mg Intravenous Daily   Continuous Infusions:   PRN Meds:.acetaminophen **OR** acetaminophen, albuterol, haloperidol lactate, LORazepam **OR** LORazepam, melatonin, ondansetron **OR** ondansetron (ZOFRAN) IV, sodium chloride   I have personally reviewed following labs and imaging studies  LABORATORY DATA: CBC: Recent Labs  Lab 09/01/21 1226 09/01/21 1552 09/02/21 0107  WBC 4.8  --  8.6  NEUTROABS 3.1  --   --   HGB 12.4* 12.9* 12.2*  HCT 38.4* 38.0* 37.4*  MCV 89.5  --  87.6  PLT 153  --  127*     Basic Metabolic Panel: Recent Labs  Lab 09/01/21 1226 09/01/21 1552 09/02/21 0107 09/03/21 0212 09/04/21 0116 09/05/21 0750  NA 142 143 137 137 136 135  K 3.8 3.9 3.6 3.2* 4.1 3.4*  CL 107  --  103 102 103 103  CO2 20*  --  24 24 24 24   GLUCOSE 115*  --  80 79 107* 105*  BUN 5*  --  6* 5* 8 7*  CREATININE 1.00  --  1.09 0.93 1.05 0.79  CALCIUM 8.4*  --  8.3* 8.6* 9.1 8.9  MG  --   --   --  1.9 1.8 2.0  PHOS  --   --   --  2.7 3.2 4.0     GFR: CrCl cannot be calculated (Unknown ideal weight.).  Liver Function Tests: Recent Labs  Lab 09/01/21 1226 09/02/21 0107  AST 57* 56*  ALT 31 29  ALKPHOS 73 66  BILITOT 0.4 0.9  PROT 7.0 7.1  ALBUMIN 3.4* 3.4*    Recent Labs  Lab 09/01/21 1226  LIPASE 42    Recent Labs  Lab 09/01/21 1545  AMMONIA 30     Coagulation Profile: No results for input(s): INR, PROTIME in the last 168 hours.  Cardiac Enzymes: No results for input(s): CKTOTAL, CKMB, CKMBINDEX, TROPONINI in the last 168 hours.  BNP (last 3 results) No results for input(s): PROBNP in the last 8760 hours.  Lipid Profile: No results for input(s): CHOL, HDL, LDLCALC, TRIG, CHOLHDL, LDLDIRECT in the last 72 hours.  Thyroid Function Tests: No results for input(s): TSH, T4TOTAL, FREET4, T3FREE,  THYROIDAB in the last 72 hours.  Anemia Panel: No results for input(s): VITAMINB12, FOLATE, FERRITIN, TIBC, IRON, RETICCTPCT in the last 72 hours.  Urine analysis:    Component Value Date/Time   COLORURINE YELLOW 11/11/2020 1604   APPEARANCEUR CLEAR 11/11/2020 1604   LABSPEC 1.014 11/11/2020 1604   PHURINE 5.0 11/11/2020 1604   GLUCOSEU NEGATIVE 11/11/2020 1604   HGBUR MODERATE (A) 11/11/2020 1604   BILIRUBINUR NEGATIVE 11/11/2020 1604   KETONESUR  NEGATIVE 11/11/2020 1604   PROTEINUR 30 (A) 11/11/2020 1604   NITRITE NEGATIVE 11/11/2020 1604   LEUKOCYTESUR NEGATIVE 11/11/2020 1604    Sepsis Labs: Lactic Acid, Venous No results found for: LATICACIDVEN  MICROBIOLOGY: No results found for this or any previous visit (from the past 240 hour(s)).  RADIOLOGY STUDIES/RESULTS: CT HEAD WO CONTRAST ( )  Result Date: 09/04/2021 CLINICAL DATA:  Head trauma EXAM: CT HEAD WITHOUT CONTRAST TECHNIQUE: Contiguous axial images were obtained from the base of the skull through the vertex without intravenous contrast. RADIATION DOSE REDUCTION: This exam was performed according to the departmental dose-optimization program which includes automated exposure control, adjustment of the mA and/or kV according to patient size and/or use of iterative reconstruction technique. COMPARISON:  09/01/2021 FINDINGS: Brain: No evidence of acute infarction, hemorrhage, hydrocephalus, extra-axial collection or mass lesion/mass effect. Encephalomalacic changes related to old right posterior MCA distribution infarct. Mild encephalomalacia changes in the right occipital lobe. Subcortical white matter and periventricular small vessel ischemic changes. Vascular: No hyperdense vessel or unexpected calcification. Skull: Normal. Negative for fracture or focal lesion. Sinuses/Orbits: Partial opacification of the right maxillary sinus. Visualized paranasal sinuses and mastoid air cells are otherwise clear. Other: None. IMPRESSION:  No evidence of acute intracranial abnormality. Old right infarcts. Small vessel ischemic changes. Electronically Signed   By: Charline Bills M.D.   On: 09/04/2021 22:02     LOS: 3 days   Jeoffrey Massed, MD  [facility]    To contact the attending provider between 7A-7P or the covering provider during after hours 7P-7A, please log into the web site [email or web address] and access using universal [place] password for that web site. If you do not have the password, please call the hospital operator.  09/05/2021, 11:56 AM

## 2021-09-05 NOTE — Care Management Important Message (Signed)
Important Message  Patient Details  Name: Dominic Carter MRN: [id number] Date of Birth: Jun 08, 1955   Medicare Important Message Given:  Yes     Orbie Pyo 09/05/2021, 2:14 PM

## 2021-09-05 NOTE — Progress Notes (Signed)
Patient continues to be agitated, removing tele leads, refusing tele monitor.   Is not easily reoriented. Attempts to hit staff when they attempt to replace leads.   Will inform MD.

## 2021-09-06 LAB — PHOSPHORUS: Phosphorus: 4.5 mg/dL (ref 2.5–4.6)

## 2021-09-06 LAB — BASIC METABOLIC PANEL
Anion gap: 8 (ref 5–15)
BUN: 8 mg/dL (ref 8–23)
CO2: 24 mmol/L (ref 22–32)
Calcium: 9.2 mg/dL (ref 8.9–10.3)
Chloride: 103 mmol/L (ref 98–111)
Creatinine, Ser: 0.93 mg/dL (ref 0.61–1.24)
GFR, Estimated: >60 mL/min (ref >60)
Glucose, Bld: 88 mg/dL (ref 70–99)
Potassium: 4.6 mmol/L (ref 3.5–5.1)
Sodium: 135 mmol/L (ref 135–145)

## 2021-09-06 LAB — MAGNESIUM: Magnesium: 2.1 mg/dL (ref 1.7–2.4)

## 2021-09-06 NOTE — TOC Progression Note (Signed)
Transition of Care [facility]) - Progression Note    Patient Details  Name: Dominic Carter MRN: [id number] Date of Birth: 1955-04-12  Transition of Care [facility]) CM/SW Contact  Mearl Latin, LCSW Phone Number: 09/06/2021, 9:08 AM  Clinical Narrative:    CSW left voicemail for patient's niece to obtain SNF choice to provide to insurance so they can complete the authorization.    Expected Discharge Plan: Skilled Nursing Facility Barriers to Discharge: Continued Medical Work up, English as a second language teacher, SNF Pending bed offer  Expected Discharge Plan and Services Expected Discharge Plan: Skilled Nursing Facility In-house Referral: Clinical Social Work   Post Acute Care Choice: Skilled Nursing Facility Living arrangements for the past 2 months: Single Family Home                                       Social Determinants of Health (SDOH) Interventions    Readmission Risk Interventions     View : No data to display.

## 2021-09-06 NOTE — TOC Progression Note (Addendum)
Transition of Care [facility] - Progression Note    Patient Details  Name: Dominic Carter MRN: [id number] Date of Birth: [birth year]  Transition of Care [facility]) CM/SW Dick[place], LCSW Phone Number: 09/06/2021, 3:56 PM  Clinical Narrative:    3:56pm-CSW left another voicemail for patient's niece regarding SNF choice. Navi will need to be updated with choice.   4:09pm-CSW received return call from patent's niece. She has chosen [employer] [facility] and requested to please contact her if patient tries to say that he is able to return home because she states he is not safe to do that now. CSW updated Navi on choice and [facility] is able to accept patient over weekend if stable.   Auth approved for [facility], Ref# [id number], 09/07/2021-09/10/2021.    Expected Discharge Plan: [place] Barriers to Discharge: Continued Medical Work up, Ship broker, SNF Pending bed offer  Expected Discharge Plan and Services Expected Discharge Plan: [place] In-house Referral: Clinical Social Work   Post Acute Care Choice: [place] Living arrangements for the past 2 months: Single Family Home                                       Social Determinants of Health (SDOH) Interventions    Readmission Risk Interventions     View : No data to display.

## 2021-09-06 NOTE — Progress Notes (Signed)
PROGRESS NOTE        PATIENT DETAILS Name: Dominic HurstRichard K Viramontes Age: 66 y.o. Sex: male Date of Birth: 05/27/1955 Admit Date: 09/01/2021 Admitting Physician Dewayne ShorterShanker Levora DredgeM Promiss Labarbera, MD WUJ:[id number]:McCoy, Fleet Contrasachel, NP  Brief Summary: Patient is a 66 y.o.  male with history of right BKA, EtOH use, seizure disorder-who presented with breakthrough seizures.  Significant events: 5/28>> admit to [facility]-breakthrough seizures 5/31>> fall while delirious-CT head negative for acute abnormalities.  Significant studies: 5/28>> CT head: No acute intracranial abnormality 5/28>> CT C-spine: No fracture/dislocation 5/28>> CXR: No acute cardiopulmonary disease 5/29>> EEG: No seizures. 5/31>> CT head: No acute abnormalities.  Significant microbiology data:   Procedures:   Consults: None   Subjective: No major issues overnight-awake and alert this morning.  Answers questions appropriately hardly any tremors.  Objective: Vitals: Blood pressure 113/79, pulse 73, temperature 97.6 F (36.4 C), temperature source Oral, resp. rate 18, SpO2 93 %.   Exam: Gen Exam:Alert awake-not in any distress HEENT:atraumatic, normocephalic Chest: B/L clear to auscultation anteriorly CVS:S1S2 regular Abdomen:soft non tender, non distended Extremities:no edema-s/p right BKA. Neurology: Non focal Skin: no rash   Pertinent Labs/Radiology:    Latest Ref Rng & Units 09/02/2021    1:07 AM 09/01/2021    3:52 PM 09/01/2021   12:26 PM  CBC  WBC 4.0 - 10.5 K/uL 8.6    4.8    Hemoglobin 13.0 - 17.0 g/dL 47.812.2   29.512.9   62.112.4    Hematocrit 39.0 - 52.0 % 37.4   38.0   38.4    Platelets 150 - 400 K/uL 127    153      Lab Results  Component Value Date   NA 135 09/06/2021   K 4.6 09/06/2021   CL 103 09/06/2021   CO2 24 09/06/2021       Assessment/Plan: Breakthrough seizures: In the setting of ongoing alcohol use and possible noncompliance with Keppra.  Continue Keppra-EEG negative for seizures.  No  further seizures since admission.  EtOH use/EtOH withdrawal: Mental status much improved-much more awake and alert today.  On Ativan per CIWA protocol.   Mechanical fall on 5/31: No obvious injuries-CT head was unremarkable.  Maintain fall precautions.  Hypokalemia: Repleted.  HTN: BP stable on amlodipine.  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  May need plastic referral as an outpatient.  History of CVA  History of right BKA  Tobacco abuse: On transdermal nicotine.  BMI: Estimated body mass index is 24.35 kg/m as calculated from the following:   Height as of 08/10/21: 5\' 11"  (1.803 m).   Weight as of 08/10/21: 79.2 kg.   Code status:   Code Status: Full Code   DVT Prophylaxis: enoxaparin (LOVENOX) injection 40 mg Start: 09/01/21 1800   Family Communication: Niece-Brittany-[phone]-updated over the phone on 6/1  Disposition Plan: Status is: Observation The patient will require care spanning > 2 midnights and should be moved to inpatient because: Seizure disorder-awaiting EEG-alcohol withdrawal-noted stable for discharge.   Planned Discharge Destination: SNF hopefully on 6/3.   Diet: Diet Order             Diet regular Room service appropriate? No; Fluid consistency: Thin  Diet effective now  Antimicrobial agents: Anti-infectives (From admission, onward)    None        MEDICATIONS: Scheduled Meds:  amLODipine  10 mg Oral Daily   enoxaparin (LOVENOX) injection  40 mg Subcutaneous Q24H   folic acid  1 mg Oral Daily   levETIRAcetam  500 mg Oral BID   melatonin  5 mg Oral QHS   multivitamin with minerals  1 tablet Oral Daily   nicotine  21 mg Transdermal Daily   sodium chloride flush  3 mL Intravenous Q12H   thiamine  100 mg Oral Daily   Or   thiamine  100 mg Intravenous Daily   Continuous Infusions:   PRN Meds:.acetaminophen **OR** acetaminophen,  albuterol, haloperidol lactate, LORazepam **OR** LORazepam, ondansetron **OR** ondansetron (ZOFRAN) IV, QUEtiapine, sodium chloride   I have personally reviewed following labs and imaging studies  LABORATORY DATA: CBC: Recent Labs  Lab 09/01/21 1226 09/01/21 1552 09/02/21 0107  WBC 4.8  --  8.6  NEUTROABS 3.1  --   --   HGB 12.4* 12.9* 12.2*  HCT 38.4* 38.0* 37.4*  MCV 89.5  --  87.6  PLT 153  --  127*     Basic Metabolic Panel: Recent Labs  Lab 09/02/21 0107 09/03/21 0212 09/04/21 0116 09/05/21 0750 09/06/21 0128  NA 137 137 136 135 135  K 3.6 3.2* 4.1 3.4* 4.6  CL 103 102 103 103 103  CO2 24 24 24 24 24   GLUCOSE 80 79 107* 105* 88  BUN 6* 5* 8 7* 8  CREATININE 1.09 0.93 1.05 0.79 0.93  CALCIUM 8.3* 8.6* 9.1 8.9 9.2  MG  --  1.9 1.8 2.0 2.1  PHOS  --  2.7 3.2 4.0 4.5     GFR: CrCl cannot be calculated (Unknown ideal weight.).  Liver Function Tests: Recent Labs  Lab 09/01/21 1226 09/02/21 0107  AST 57* 56*  ALT 31 29  ALKPHOS 73 66  BILITOT 0.4 0.9  PROT 7.0 7.1  ALBUMIN 3.4* 3.4*    Recent Labs  Lab 09/01/21 1226  LIPASE 42    Recent Labs  Lab 09/01/21 1545  AMMONIA 30     Coagulation Profile: No results for input(s): INR, PROTIME in the last 168 hours.  Cardiac Enzymes: No results for input(s): CKTOTAL, CKMB, CKMBINDEX, TROPONINI in the last 168 hours.  BNP (last 3 results) No results for input(s): PROBNP in the last 8760 hours.  Lipid Profile: No results for input(s): CHOL, HDL, LDLCALC, TRIG, CHOLHDL, LDLDIRECT in the last 72 hours.  Thyroid Function Tests: No results for input(s): TSH, T4TOTAL, FREET4, T3FREE, THYROIDAB in the last 72 hours.  Anemia Panel: No results for input(s): VITAMINB12, FOLATE, FERRITIN, TIBC, IRON, RETICCTPCT in the last 72 hours.  Urine analysis:    Component Value Date/Time   COLORURINE YELLOW 11/11/2020 1604   APPEARANCEUR CLEAR 11/11/2020 1604   LABSPEC 1.014 11/11/2020 1604   PHURINE 5.0  11/11/2020 1604   GLUCOSEU NEGATIVE 11/11/2020 1604   HGBUR MODERATE (A) 11/11/2020 1604   BILIRUBINUR NEGATIVE 11/11/2020 1604   KETONESUR NEGATIVE 11/11/2020 1604   PROTEINUR 30 (A) 11/11/2020 1604   NITRITE NEGATIVE 11/11/2020 1604   LEUKOCYTESUR NEGATIVE 11/11/2020 1604    Sepsis Labs: Lactic Acid, Venous No results found for: LATICACIDVEN  MICROBIOLOGY: No results found for this or any previous visit (from the past 240 hour(s)).  RADIOLOGY STUDIES/RESULTS: CT HEAD WO CONTRAST (01/11/2021)  Result Date: 09/04/2021 CLINICAL DATA:  Head trauma EXAM: CT HEAD WITHOUT CONTRAST TECHNIQUE: Contiguous axial  images were obtained from the base of the skull through the vertex without intravenous contrast. RADIATION DOSE REDUCTION: This exam was performed according to the departmental dose-optimization program which includes automated exposure control, adjustment of the mA and/or kV according to patient size and/or use of iterative reconstruction technique. COMPARISON:  09/01/2021 FINDINGS: Brain: No evidence of acute infarction, hemorrhage, hydrocephalus, extra-axial collection or mass lesion/mass effect. Encephalomalacic changes related to old right posterior MCA distribution infarct. Mild encephalomalacia changes in the right occipital lobe. Subcortical white matter and periventricular small vessel ischemic changes. Vascular: No hyperdense vessel or unexpected calcification. Skull: Normal. Negative for fracture or focal lesion. Sinuses/Orbits: Partial opacification of the right maxillary sinus. Visualized paranasal sinuses and mastoid air cells are otherwise clear. Other: None. IMPRESSION: No evidence of acute intracranial abnormality. Old right infarcts. Small vessel ischemic changes. Electronically Signed   By: Charline Bills M.D.   On: 09/04/2021 22:02     LOS: 4 days   Jeoffrey Massed, MD  [facility]    To contact the attending provider between 7A-7P or the covering provider during  after hours 7P-7A, please log into the web site [email or web address] and access using universal [place] password for that web site. If you do not have the password, please call the hospital operator.  09/06/2021, 1:58 PM

## 2021-09-07 MED ORDER — MELATONIN 5 MG PO TABS: 5.0000 mg | ORAL_TABLET | Freq: Every day | ORAL | 0 refills | Status: DC

## 2021-09-07 NOTE — Progress Notes (Signed)
Attempted to call report again with no answer. Report was given to PTAR. IV was removed. Patient discharged with belongings via Corey Harold to [facility].

## 2021-09-07 NOTE — TOC Transition Note (Signed)
Transition of Care [facility]) - CM/SW Discharge Note   Patient Details  Name: DATRON LOPIANO MRN: [id number] Date of Birth: December 09, 1955  [facility]) CM/SW Contact:  Ina Homes, [place] Phone Number: 09/07/2021, 10:27 AM   Clinical Narrative:     Per MD, pt medically ready for d/c today.   SW spoke with Juliann Pulse [employer] [phone]) confirmed able to accept pt today. Room 101b Call Report: [phone]  SW attempted to call [name] [phone]) [phone] but no answer and unable to leave VM.   PTAR called   Final next level of care: Skilled Nursing Facility Barriers to Discharge: Barriers Resolved   Patient Goals and CMS Choice Patient states their goals for this hospitalization and ongoing recovery are:: Rehab CMS Medicare.gov Compare Post Acute Care list provided to:: Patient Represenative (must comment) Choice offered to / list presented to :  (Niece)  Discharge Placement              Patient chooses bed at: [facility] Patient to be transferred to facility by: Home Name of family member notified: Britany Patient and family notified of of transfer: 09/07/21  Discharge Plan and Services In-house Referral: Clinical Social Work   Post Acute Care Choice: [place]                               Social Determinants of Health (SDOH) Interventions     Readmission Risk Interventions     View : No data to display.

## 2021-09-07 NOTE — Progress Notes (Signed)
Attempted to call report to [facility] twice with no answer. Will try again in 15 minutes.

## 2021-09-07 NOTE — Discharge Summary (Signed)
PATIENT DETAILS Name: Dominic Carter Age: 66 y.o. Sex: male Date of Birth: 1956-01-05 MRN: [id number]. Admitting Physician: Maretta Bees, MD WUJ:[id number], Fleet Contras, NP  Admit Date: 09/01/2021 Discharge date: 09/07/2021  Recommendations for Outpatient Follow-up:  Follow up with PCP in 1-2 weeks Please obtain CMP/CBC in one week Continue to counsel regarding importance of avoiding further EtOH use. Please consider outpatient referral to plastic surgery for chronic scalp wound.  Admitted From:  Home  Disposition: Skilled nursing facility   Discharge Condition: good  CODE STATUS:   Code Status: Full Code   Diet recommendation:  Diet Order             Diet - low sodium heart healthy           Diet regular Room service appropriate? No; Fluid consistency: Thin  Diet effective now                    Brief Summary: Patient is a 66 y.o.  male with history of right BKA, EtOH use, seizure disorder-who presented with breakthrough seizures.   Significant events: 5/28>> admit to [facility]-breakthrough seizures 5/31>> fall while delirious-CT head negative for acute abnormalities.   Significant studies: 5/28>> CT head: No acute intracranial abnormality 5/28>> CT C-spine: No fracture/dislocation 5/28>> CXR: No acute cardiopulmonary disease 5/29>> EEG: No seizures. 5/31>> CT head: No acute abnormalities.   Significant microbiology data:     Procedures:     Consults: None   Brief Hospital Course: Breakthrough seizures: In the setting of ongoing alcohol use and possible noncompliance with Keppra.  Continue Keppra-EEG negative for seizures.  No further seizures since admission.   EtOH use/EtOH withdrawal: Mental status much improved-much more awake and alert today.  Managed with Ativan per CIWA protocol.   Mechanical fall on 5/31: No obvious injuries-CT head was unremarkable.  Maintain fall precautions.   Hypokalemia: Repleted.   HTN: BP stable on amlodipine.    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  May need plastic referral as an outpatient.   History of CVA   History of right BKA   Tobacco abuse: On transdermal nicotine.  Debility/deconditioning: Evaluated by PT/OT-recommendations of SNF.   BMI: Estimated body mass index is 24.35 kg/m as calculated from the following:   Height as of 08/10/21:  (1.803 m).   Weight as of 08/10/21: 79.2 kg.    Discharge Diagnoses:  Principal Problem:   Seizure (HCC) Active Problems:   ETOH abuse   HTN (hypertension)   Scalp wound   Transaminitis   History of CVA (cerebrovascular accident)   Tobacco abuse   Discharge Instructions:  Activity:  As tolerated with Full fall precautions use walker/cane & assistance as needed   Discharge Instructions     Ambulatory referral to Neurology   Complete by: As directed    An appointment is requested in approximately: 4 weeks   Ambulatory referral to Plastic Surgery   Complete by: As directed    Diet - low sodium heart healthy   Complete by: As directed    Increase activity slowly   Complete by: As directed    No wound care   Complete by: As directed       Allergies as of 09/07/2021       Reactions   Castor Oil Nausea And Vomiting   Other Nausea And Vomiting   Boiled Fat back meat  Medication List     STOP taking these medications    famotidine 40 MG tablet Commonly known as: PEPCID   Hydrocortisone Max St 1 % Generic drug: hydrocortisone cream   sulfamethoxazole-trimethoprim 800-160 MG tablet Commonly known as: BACTRIM DS       TAKE these medications    amLODipine 10 MG tablet Commonly known as: NORVASC Take 1 tablet (10 mg total) by mouth daily.   aspirin EC 81 MG tablet Take 1 tablet (81 mg total) by mouth daily. Swallow whole.   folic acid 1 MG tablet Commonly known as: FOLVITE Take 1 tablet (1 mg total) by mouth daily.    levETIRAcetam 500 MG tablet Commonly known as: KEPPRA Take 1 tablet (500 mg total) by mouth 2 (two) times daily.   melatonin 5 MG Tabs Take 1 tablet (5 mg total) by mouth at bedtime.   multivitamin with minerals Tabs tablet Take 1 tablet by mouth daily.   nicotine 21 mg/24hr patch Commonly known as: NICODERM CQ - dosed in mg/24 hours Place 1 patch (21 mg total) onto the skin daily.   pravastatin 20 MG tablet Commonly known as: PRAVACHOL Take 1 tablet (20 mg total) by mouth daily.   thiamine 100 MG tablet Take 1 tablet (100 mg total) by mouth daily.        Contact information for follow-up providers     Courtney Paris, NP. Schedule an appointment as soon as possible for a visit in 1 week(s).   Specialty: Nurse Practitioner Contact information: [address] [place] [address] [phone]         [facility] Follow up.   Specialty: Plastic Surgery Why: Office will call with date/time, If you dont hear from them,please give them a call Contact information: [address] [place] [address] [phone]        [facility] Follow up.   Specialty: Neurology Why: Office will call with date/time, If you dont hear from them,please give them a call Contact information: [address] [place] [address] [phone]             Contact information for after-discharge care     Destination     [facility] Preferred SNF .   Service: Skilled Nursing Contact information: [address] [place] [address] [phone]                    Allergies  Allergen Reactions   Castor Oil Nausea And Vomiting   Other Nausea And Vomiting    Boiled Fat back meat     Other Procedures/Studies: CT HEAD WO CONTRAST ( )  Result Date: 09/04/2021 CLINICAL DATA:  Head trauma EXAM: CT HEAD WITHOUT CONTRAST TECHNIQUE: Contiguous axial  images were obtained from the base of the skull through the vertex without intravenous contrast. RADIATION DOSE REDUCTION: This exam was performed according to the departmental dose-optimization program which includes automated exposure control, adjustment of the mA and/or kV according to patient size and/or use of iterative reconstruction technique. COMPARISON:  09/01/2021 FINDINGS: Brain: No evidence of acute infarction, hemorrhage, hydrocephalus, extra-axial collection or mass lesion/mass effect. Encephalomalacic changes related to old right posterior MCA distribution infarct. Mild encephalomalacia changes in the right occipital lobe. Subcortical white matter and periventricular small vessel ischemic changes. Vascular: No hyperdense vessel or unexpected calcification. Skull: Normal. Negative for fracture or focal lesion. Sinuses/Orbits: Partial opacification of the right maxillary sinus. Visualized paranasal sinuses  and mastoid air cells are otherwise clear. Other: None. IMPRESSION: No evidence of acute intracranial abnormality. Old right infarcts. Small vessel ischemic changes. Electronically Signed   By: Charline Bills M.D.   On: 09/04/2021 22:02   CT Head Wo Contrast  Result Date: 09/01/2021 CLINICAL DATA:  Polytrauma, blunt EXAM: CT HEAD WITHOUT CONTRAST CT CERVICAL SPINE WITHOUT CONTRAST TECHNIQUE: Multidetector CT imaging of the head and cervical spine was performed following the standard protocol without intravenous contrast. Multiplanar CT image reconstructions of the cervical spine were also generated. RADIATION DOSE REDUCTION: This exam was performed according to the departmental dose-optimization program which includes automated exposure control, adjustment of the mA and/or kV according to patient size and/or use of iterative reconstruction technique. COMPARISON:  CT head 11/25/2020. FINDINGS: CT HEAD FINDINGS Brain: Similar old infarct with encephalomalacia in the right temporoparietal region.  Remote left cerebellar lacunar infarct. No evidence of acute large vascular territory infarct. Vascular: No hyperdense vessel identified. Calcific intracranial atherosclerosis Skull: No acute fracture. Sinuses/Orbits: Moderate mucosal thickening of the right maxillary sinus and left sphenoid sinus with frothy secretions. No acute orbital findings. Other: No mastoid effusions. CT CERVICAL SPINE FINDINGS Motion limited. Alignment: Straightening of normal cervical lordosis. No substantial sagittal subluxation. Mild rotation of C1 on C2, probably positional. Skull base and vertebrae: Vertebral body heights are maintained. No evidence of acute fracture. Soft tissues and spinal canal: No prevertebral fluid or swelling. No visible canal hematoma. 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 Upper chest: Visualized lung apices are clear. IMPRESSION: CT head: 1. No evidence of acute intracranial abnormality. 2. Remote infarcts and chronic microvascular disease. CT cervical spine: 1. No evidence of acute fracture or traumatic malalignment. 2. Degenerative changes, detailed above. Electronically Signed   By: Feliberto Harts M.D.   On: 09/01/2021 13:30   CT Cervical Spine Wo Contrast  Result Date: 09/01/2021 CLINICAL DATA:  Polytrauma, blunt EXAM: CT HEAD WITHOUT CONTRAST CT CERVICAL SPINE WITHOUT CONTRAST TECHNIQUE: Multidetector CT imaging of the head and cervical spine was performed following the standard protocol without intravenous contrast. Multiplanar CT image reconstructions of the cervical spine were also generated. RADIATION DOSE REDUCTION: This exam was performed according to the departmental dose-optimization program which includes automated exposure control, adjustment of the mA and/or kV according to patient size and/or use of iterative reconstruction technique. COMPARISON:  CT head 11/25/2020. FINDINGS: CT  HEAD FINDINGS Brain: Similar old infarct with encephalomalacia in the right temporoparietal region. Remote left cerebellar lacunar infarct. No evidence of acute large vascular territory infarct. Vascular: No hyperdense vessel identified. Calcific intracranial atherosclerosis Skull: No acute fracture. Sinuses/Orbits: Moderate mucosal thickening of the right maxillary sinus and left sphenoid sinus with frothy secretions. No acute orbital findings. Other: No mastoid effusions. CT CERVICAL SPINE FINDINGS Motion limited. Alignment: Straightening of normal cervical lordosis. No substantial sagittal subluxation. Mild rotation of C1 on C2, probably positional. Skull base and vertebrae: Vertebral body heights are maintained. No evidence of acute fracture. Soft tissues and spinal canal: No prevertebral fluid or swelling. No visible canal hematoma. 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 Upper chest: Visualized lung apices are clear. IMPRESSION: CT head: 1. No evidence of acute intracranial abnormality. 2. Remote infarcts and chronic microvascular disease. CT cervical spine: 1. No evidence of acute fracture or traumatic malalignment. 2. Degenerative changes, detailed above. Electronically Signed   By: Feliberto Harts  M.D.   On: 09/01/2021 13:30   DG Chest Port 1 View  Result Date: 09/01/2021 CLINICAL DATA:  Seizure. EXAM: PORTABLE CHEST 1 VIEW COMPARISON:  Chest radiograph 11/26/2020 FINDINGS: No consolidation. No visible pleural effusions or pneumothorax. Cardiomediastinal silhouette is within normal limits. No displaced fracture. IMPRESSION: No evidence of acute cardiopulmonary disease. Electronically Signed   By: Feliberto Harts M.D.   On: 09/01/2021 14:22   EEG adult  Result Date: 09/02/2021 Charlsie Quest, MD     09/02/2021  2:04 PM Patient Name: Dominic Carter MRN: [id number] Epilepsy Attending:  Charlsie Quest Referring Physician/Provider: Rozelle Logan, DO Date: 09/02/2021 Duration: 21.34 mins Patient history: 66 year old male with history of seizures, alcohol use who presented with breakthrough seizures.  EEG to evaluate for seizure. Level of alertness: Awake, asleep AEDs during EEG study: Ativan Technical aspects: This EEG study was done with scalp electrodes positioned according to the 10-20 International system of electrode placement. Electrical activity was acquired at a sampling rate of 500Hz  and reviewed with a high frequency filter of 70Hz  and a low frequency filter of 1Hz . EEG data were recorded continuously and digitally stored. Description: The posterior dominant rhythm consists of 8-9 Hz activity of moderate voltage (25-35 uV) seen predominantly in posterior head regions, symmetric and reactive to eye opening and eye closing. Sleep was characterized by vertex waves, sleep spindles (12 to 14 Hz), maximal frontocentral region. EEG showed intermittent generalized  3 to 6 Hz theta-delta slowing in right temporal region. Hyperventilation and photic stimulation were not performed.   ABNORMALITY - Intermittent slow, right temporal region IMPRESSION: This study is suggestive of cortical dysfunction in right temporal region likely secondary to underlying stroke.  No seizures or epileptiform discharges were seen throughout the recording. Priyanka     TODAY-DAY OF DISCHARGE:  Subjective:   Dariyon Urquilla today has no headache,no chest abdominal pain,no new weakness tingling or numbness, feels much better wants to go home today.   Objective:   Blood pressure 121/76, pulse 87, temperature 97.8 F (36.6 C), temperature source Oral, resp. rate 15, SpO2 98 %.  Intake/Output Summary (Last 24 hours) at 09/07/2021 0923 Last data filed at 09/07/2021 0542 Gross per 24 hour  Intake 240 ml  Output 1200 ml  Net -960 ml   There were no vitals filed for this visit.  Exam: Awake Alert,  Oriented *3, No new F.N deficits, Normal affect .AT,PERRAL Supple Neck,No JVD, No cervical lymphadenopathy appriciated.  Symmetrical Chest wall movement, Good air movement bilaterally, CTAB RRR,No Gallops,Rubs or new Murmurs, No Parasternal Heave +ve B.Sounds, Abd Soft, Non tender, No organomegaly appriciated, No rebound -guarding or rigidity. No Cyanosis, Clubbing or edema, No new Rash or bruise   PERTINENT RADIOLOGIC STUDIES: No results found.   PERTINENT LAB RESULTS: CBC: No results for input(s): WBC, HGB, HCT, PLT in the last 72 hours. CMET CMP     Component Value Date/Time   NA 135 09/06/2021 0128   NA 141 02/03/2020 0000   NA 142 07/29/2011 1632   K 4.6 09/06/2021 0128   K 3.7 07/29/2011 1632   CL 103 09/06/2021 0128   CL 106 07/29/2011 1632   CO2 24 09/06/2021 0128   CO2 27 07/29/2011 1632   GLUCOSE 88 09/06/2021 0128   GLUCOSE 108 (H) 07/29/2011 1632   BUN 8 09/06/2021 0128   BUN 8 02/03/2020 0000   BUN 5 (L) 07/29/2011 1632   CREATININE 0.93 09/06/2021 0128   CREATININE 0.94 07/29/2011  1632   CALCIUM 9.2 09/06/2021 0128   CALCIUM 8.0 (L) 07/29/2011 1632   PROT 7.1 09/02/2021 0107   PROT 8.0 07/29/2011 1632   ALBUMIN 3.4 (L) 09/02/2021 0107   ALBUMIN 3.6 07/29/2011 1632   AST 56 (H) 09/02/2021 0107   AST 50 (H) 07/29/2011 1632   ALT 29 09/02/2021 0107   ALT 47 07/29/2011 1632   ALKPHOS 66 09/02/2021 0107   ALKPHOS 102 07/29/2011 1632   BILITOT 0.9 09/02/2021 0107   BILITOT 0.1 (L) 07/29/2011 1632   GFRNONAA >60 09/06/2021 0128   GFRNONAA >60 07/29/2011 1632   GFRAA 90 02/03/2020 0000   GFRAA >60 07/29/2011 1632    GFR CrCl cannot be calculated (Unknown ideal weight.). No results for input(s): LIPASE, AMYLASE in the last 72 hours. No results for input(s): CKTOTAL, CKMB, CKMBINDEX, TROPONINI in the last 72 hours. Invalid input(s): POCBNP No results for input(s): DDIMER in the last 72 hours. No results for input(s): HGBA1C in the last 72 hours. No  results for input(s): CHOL, HDL, LDLCALC, TRIG, CHOLHDL, LDLDIRECT in the last 72 hours. No results for input(s): TSH, T4TOTAL, T3FREE, THYROIDAB in the last 72 hours.  Invalid input(s): FREET3 No results for input(s): VITAMINB12, FOLATE, FERRITIN, TIBC, IRON, RETICCTPCT in the last 72 hours. Coags: No results for input(s): INR in the last 72 hours.  Invalid input(s): PT Microbiology: No results found for this or any previous visit (from the past 240 hour(s)).  FURTHER DISCHARGE INSTRUCTIONS:  Get Medicines reviewed and adjusted: Please take all your medications with you for your next visit with your Primary MD  Laboratory/radiological data: Please request your Primary MD to go over all hospital tests and procedure/radiological results at the follow up, please ask your Primary MD to get all Hospital records sent to his/her office.  In some cases, they will be blood work, cultures and biopsy results pending at the time of your discharge. Please request that your primary care M.D. goes through all the records of your hospital data and follows up on these results.  Also Note the following: If you experience worsening of your admission symptoms, develop shortness of breath, life threatening emergency, suicidal or homicidal thoughts you must seek medical attention immediately by calling 911 or calling your MD immediately  if symptoms less severe.  You must read complete instructions/literature along with all the possible adverse reactions/side effects for all the Medicines you take and that have been prescribed to you. Take any new Medicines after you have completely understood and accpet all the possible adverse reactions/side effects.   Do not drive when taking Pain medications or sleeping medications (Benzodaizepines)  Do not take more than prescribed Pain, Sleep and Anxiety Medications. It is not advisable to combine anxiety,sleep and pain medications without talking with your primary  care practitioner  Special Instructions: If you have smoked or chewed Tobacco  in the last 2 yrs please stop smoking, stop any regular Alcohol  and or any Recreational drug use.  Wear Seat belts while driving.  Please note: You were cared for by a hospitalist during your hospital stay. Once you are discharged, your primary care physician will handle any further medical issues. Please note that NO REFILLS for any discharge medications will be authorized once you are discharged, as it is imperative that you return to your primary care physician (or establish a relationship with a primary care physician if you do not have one) for your post hospital discharge needs so that they can reassess  your need for medications and monitor your lab values.  Total Time spent coordinating discharge including counseling, education and face to face time equals greater than 30 minutes.  SignedJeoffrey Massed: Dalaysia Harms 09/07/2021 9:23 AM

## 2021-10-02 ENCOUNTER — Encounter: Payer: Self-pay | Admitting: Neurology

## 2021-10-02 ENCOUNTER — Ambulatory Visit: Payer: Medicare Other | Admitting: Neurology

## 2021-10-04 ENCOUNTER — Institutional Professional Consult (permissible substitution): Payer: Medicare Other | Admitting: Plastic Surgery

## 2022-01-05 ENCOUNTER — Emergency Department (HOSPITAL_COMMUNITY): Payer: Medicare Other

## 2022-01-05 ENCOUNTER — Encounter (HOSPITAL_COMMUNITY): Payer: Self-pay | Admitting: Emergency Medicine

## 2022-01-05 ENCOUNTER — Inpatient Hospital Stay (HOSPITAL_COMMUNITY)
Admission: EM | Admit: 2022-01-05 | Discharge: 2022-01-10 | DRG: 919 | Disposition: A | Discharge status: Home / Self Care | Payer: Medicare Other | Attending: Family Medicine | Admitting: Family Medicine

## 2022-01-05 DIAGNOSIS — N151 Renal and perinephric abscess: Secondary | ICD-10-CM | POA: Diagnosis present

## 2022-01-05 DIAGNOSIS — Z91148 Patient's other noncompliance with medication regimen for other reason: Secondary | ICD-10-CM

## 2022-01-05 DIAGNOSIS — Z720 Tobacco use: Secondary | ICD-10-CM | POA: Diagnosis present

## 2022-01-05 DIAGNOSIS — Z79899 Other long term (current) drug therapy: Secondary | ICD-10-CM

## 2022-01-05 DIAGNOSIS — I10 Essential (primary) hypertension: Secondary | ICD-10-CM | POA: Diagnosis present

## 2022-01-05 DIAGNOSIS — T8579XA Infection and inflammatory reaction due to other internal prosthetic devices, implants and grafts, initial encounter: Principal | ICD-10-CM | POA: Diagnosis present

## 2022-01-05 DIAGNOSIS — Z89511 Acquired absence of right leg below knee: Secondary | ICD-10-CM

## 2022-01-05 DIAGNOSIS — Z716 Tobacco abuse counseling: Secondary | ICD-10-CM

## 2022-01-05 DIAGNOSIS — R569 Unspecified convulsions: Secondary | ICD-10-CM

## 2022-01-05 DIAGNOSIS — L02214 Cutaneous abscess of groin: Secondary | ICD-10-CM | POA: Diagnosis not present

## 2022-01-05 DIAGNOSIS — Z888 Allergy status to other drugs, medicaments and biological substances status: Secondary | ICD-10-CM

## 2022-01-05 DIAGNOSIS — Y832 Surgical operation with anastomosis, bypass or graft as the cause of abnormal reaction of the patient, or of later complication, without mention of misadventure at the time of the procedure: Secondary | ICD-10-CM | POA: Diagnosis present

## 2022-01-05 DIAGNOSIS — B954 Other streptococcus as the cause of diseases classified elsewhere: Secondary | ICD-10-CM | POA: Diagnosis present

## 2022-01-05 DIAGNOSIS — Z8249 Family history of ischemic heart disease and other diseases of the circulatory system: Secondary | ICD-10-CM

## 2022-01-05 DIAGNOSIS — Z86718 Personal history of other venous thrombosis and embolism: Secondary | ICD-10-CM

## 2022-01-05 DIAGNOSIS — F17210 Nicotine dependence, cigarettes, uncomplicated: Secondary | ICD-10-CM | POA: Diagnosis present

## 2022-01-05 DIAGNOSIS — N12 Tubulo-interstitial nephritis, not specified as acute or chronic: Secondary | ICD-10-CM | POA: Diagnosis present

## 2022-01-05 DIAGNOSIS — G40909 Epilepsy, unspecified, not intractable, without status epilepticus: Secondary | ICD-10-CM | POA: Diagnosis present

## 2022-01-05 DIAGNOSIS — I745 Embolism and thrombosis of iliac artery: Secondary | ICD-10-CM | POA: Diagnosis present

## 2022-01-05 DIAGNOSIS — B9620 Unspecified Escherichia coli [E. coli] as the cause of diseases classified elsewhere: Secondary | ICD-10-CM | POA: Diagnosis present

## 2022-01-05 DIAGNOSIS — F1010 Alcohol abuse, uncomplicated: Secondary | ICD-10-CM | POA: Diagnosis present

## 2022-01-05 DIAGNOSIS — Z8673 Personal history of transient ischemic attack (TIA), and cerebral infarction without residual deficits: Secondary | ICD-10-CM

## 2022-01-05 DIAGNOSIS — E785 Hyperlipidemia, unspecified: Secondary | ICD-10-CM | POA: Diagnosis present

## 2022-01-05 DIAGNOSIS — Z7982 Long term (current) use of aspirin: Secondary | ICD-10-CM

## 2022-01-05 LAB — COMPREHENSIVE METABOLIC PANEL
ALT: 31 U/L (ref 0–44)
AST: 54 U/L — ABNORMAL HIGH (ref 15–41)
Albumin: 2.5 g/dL — ABNORMAL LOW (ref 3.5–5.0)
Alkaline Phosphatase: 76 U/L (ref 38–126)
Anion gap: 10 (ref 5–15)
BUN: 7 mg/dL — ABNORMAL LOW (ref 8–23)
CO2: 20 mmol/L — ABNORMAL LOW (ref 22–32)
Calcium: 8.6 mg/dL — ABNORMAL LOW (ref 8.9–10.3)
Chloride: 102 mmol/L (ref 98–111)
Creatinine, Ser: 0.87 mg/dL (ref 0.61–1.24)
GFR, Estimated: >60 mL/min (ref >60)
Glucose, Bld: 95 mg/dL (ref 70–99)
Potassium: 4.2 mmol/L (ref 3.5–5.1)
Sodium: 132 mmol/L — ABNORMAL LOW (ref 135–145)
Total Bilirubin: 0.8 mg/dL (ref 0.3–1.2)
Total Protein: 8.9 g/dL — ABNORMAL HIGH (ref 6.5–8.1)

## 2022-01-05 LAB — URINALYSIS, ROUTINE W REFLEX MICROSCOPIC
Bilirubin Urine: NEGATIVE
Glucose, UA: NEGATIVE mg/dL
Hgb urine dipstick: NEGATIVE
Ketones, ur: NEGATIVE mg/dL
Leukocytes,Ua: NEGATIVE
Nitrite: NEGATIVE
Protein, ur: 30 mg/dL — AB
Specific Gravity, Urine: 1.023 (ref 1.005–1.030)
pH: 5 (ref 5.0–8.0)

## 2022-01-05 LAB — CBC
HCT: 46.6 % (ref 39.0–52.0)
Hemoglobin: 14.6 g/dL (ref 13.0–17.0)
MCH: 28.5 pg (ref 26.0–34.0)
MCHC: 31.3 g/dL (ref 30.0–36.0)
MCV: 91 fL (ref 80.0–100.0)
Platelets: 255 10*3/uL (ref 150–400)
RBC: 5.12 MIL/uL (ref 4.22–5.81)
RDW: 14.7 % (ref 11.5–15.5)
WBC: 7.8 10*3/uL (ref 4.0–10.5)
nRBC: 0 % (ref 0.0–0.2)

## 2022-01-05 LAB — LACTIC ACID, PLASMA: Lactic Acid, Venous: 1.6 mmol/L (ref 0.5–1.9)

## 2022-01-05 LAB — LIPASE, BLOOD: Lipase: 28 U/L (ref 11–51)

## 2022-01-05 MED ORDER — PIPERACILLIN-TAZOBACTAM 3.375 G IVPB
3.3750 g | Freq: Four times a day (QID) | INTRAVENOUS | Status: DC
  Administered 2022-01-05: 3.375 g via INTRAVENOUS
  Filled 2022-01-05: qty 50

## 2022-01-05 MED ORDER — LACTATED RINGERS IV BOLUS
1000.0000 mL | Freq: Once | INTRAVENOUS | Status: AC
  Administered 2022-01-05: 1000 mL via INTRAVENOUS

## 2022-01-05 MED ORDER — IOHEXOL 350 MG/ML SOLN
75.0000 mL | Freq: Once | INTRAVENOUS | Status: AC | PRN
  Administered 2022-01-05: 75 mL via INTRAVENOUS

## 2022-01-05 MED ORDER — LORAZEPAM 1 MG PO TABS: 1.0000 mg | ORAL_TABLET | ORAL | Status: AC | PRN

## 2022-01-05 MED ORDER — LORAZEPAM 2 MG/ML IJ SOLN: 1.0000 mg | INTRAMUSCULAR | Status: AC | PRN

## 2022-01-05 NOTE — ED Notes (Signed)
Pt found in lobby crawling on the floor. When asked what he was doing pt answered he was looking for his walker. No walker has been found at this time and pt shows no signs of fall or trauma. Triage nurse notified.

## 2022-01-05 NOTE — ED Triage Notes (Signed)
Patient BIB [facility] from home after quitting ETOH "cold [place]" on Friday, complains of right groin pain that feels like a hernia he had seven years ago.  BP 138/72 HR 88 SpO2 99% on room air CBG 132

## 2022-01-05 NOTE — ED Notes (Signed)
Patient transported to CT 

## 2022-01-05 NOTE — ED Notes (Signed)
Ct tech attempted to start iv x2 for ct scan but unsuccessful. Ct pending iv

## 2022-01-05 NOTE — Consult Note (Signed)
Reason for Consult: Right inguinal abscess Referring Physician: Dr. Marjo Bicker Dominic Carter is an 66 y.o. male.  HPI: Patient is a 66 year old male who comes in secondary to right inguinal pain.  Patient states that this has been going on for approximately a week.  He states that he has been having some swelling.  Patient does state that he had a previous hernia repair in approximately 2018 at [facility].  Patient's had no fevers or chills at home.  Patient underwent CT scan for EDP.  Patient was found to have large right inguinal abscess.  This appears to be associated with mesh, patient also with some subcutaneous extension.  Patient also with a left renal abscess.    Past Medical History:  Diagnosis Date   DVT (deep venous thrombosis) (HCC)    Dyslipidemia    ETOH abuse    Hx of BKA, right (HCC)    Hypertension    Seizure disorder (HCC)     Past Surgical History:  Procedure Laterality Date   BELOW KNEE LEG AMPUTATION Right     Family History  Problem Relation Age of Onset   Hypertension Mother    Hypertension Sister    Hypertension Brother    Hypertension Maternal Grandmother    Hypertension Maternal Grandfather     Social History:  reports that he has been smoking cigarettes. He has been smoking an average of 1 pack per day. He has never used smokeless tobacco. He reports current alcohol use. He reports that he does not use drugs.  Allergies:  Allergies  Allergen Reactions   Castor Oil Nausea And Vomiting   Other Nausea And Vomiting    Boiled Fat back meat    Medications: I have reviewed the patient's current medications.  Results for orders placed or performed during the hospital encounter of 01/05/22 (from the past 48 hour(s))  Lipase, blood     Status: None   Collection Time: 01/05/22  1:59 PM  Result Value Ref Range   Lipase 28 11 - 51 U/L    Comment: Performed at [facility], [address] [place], [place] [address]  Comprehensive metabolic  panel     Status: Abnormal   Collection Time: 01/05/22  1:59 PM  Result Value Ref Range   Sodium 132 (L) 135 - 145 mmol/L   Potassium 4.2 3.5 - 5.1 mmol/L   Chloride 102 98 - 111 mmol/L   CO2 20 (L) 22 - 32 mmol/L   Glucose, Bld 95 70 - 99 mg/dL    Comment: Glucose reference range applies only to samples taken after fasting for at least 8 hours.   BUN 7 (L) 8 - 23 mg/dL   Creatinine, Ser 6.04 0.61 - 1.24 mg/dL   Calcium 8.6 (L) 8.9 - 10.3 mg/dL   Total Protein 8.9 (H) 6.5 - 8.1 g/dL   Albumin 2.5 (L) 3.5 - 5.0 g/dL   AST 54 (H) 15 - 41 U/L   ALT 31 0 - 44 U/L   Alkaline Phosphatase 76 38 - 126 U/L   Total Bilirubin 0.8 0.3 - 1.2 mg/dL   GFR, Estimated >54 >09 mL/min    Comment: (NOTE) Calculated using the CKD-EPI Creatinine Equation (2021)    Anion gap 10 5 - 15    Comment: Performed at [facility], [address], [place], [place] [address]  CBC     Status: None   Collection Time: 01/05/22  1:59 PM  Result Value Ref  Range   WBC 7.8 4.0 - 10.5 K/uL   RBC 5.12 4.22 - 5.81 MIL/uL   Hemoglobin 14.6 13.0 - 17.0 g/dL   HCT 78.4 69.6 - 29.5 %   MCV 91.0 80.0 - 100.0 fL   MCH 28.5 26.0 - 34.0 pg   MCHC 31.3 30.0 - 36.0 g/dL   RDW 28.4 13.2 - 44.0 %   Platelets 255 150 - 400 K/uL   nRBC 0.0 0.0 - 0.2 %    Comment: Performed at [facility], [address] [place], [place] [address]  Urinalysis, Routine w reflex microscopic Urine, Clean Catch     Status: Abnormal   Collection Time: 01/05/22  1:59 PM  Result Value Ref Range   Color, Urine AMBER (A) YELLOW    Comment: BIOCHEMICALS MAY BE AFFECTED BY COLOR   APPearance HAZY (A) CLEAR   Specific Gravity, Urine 1.023 1.005 - 1.030   pH 5.0 5.0 - 8.0   Glucose, UA NEGATIVE NEGATIVE mg/dL   Hgb urine dipstick NEGATIVE NEGATIVE   Bilirubin Urine NEGATIVE NEGATIVE   Ketones, ur NEGATIVE NEGATIVE mg/dL   Protein, ur 30 (A) NEGATIVE mg/dL   Nitrite NEGATIVE NEGATIVE   Leukocytes,Ua NEGATIVE NEGATIVE   RBC / HPF 0-5 0 -  5 RBC/hpf   WBC, UA 0-5 0 - 5 WBC/hpf   Bacteria, UA RARE (A) NONE SEEN   Mucus PRESENT     Comment: Performed at [facility], [address] [place], [place] [address]    CT ABDOMEN PELVIS W CONTRAST  Result Date: 01/05/2022 CLINICAL DATA:  Right groin pain, initial encounter EXAM: CT ABDOMEN AND PELVIS WITH CONTRAST TECHNIQUE: Multidetector CT imaging of the abdomen and pelvis was performed using the standard protocol following bolus administration of intravenous contrast. RADIATION DOSE REDUCTION: This exam was performed according to the departmental dose-optimization program which includes automated exposure control, adjustment of the mA and/or kV according to patient size and/or use of iterative reconstruction technique. CONTRAST:  96mL OMNIPAQUE IOHEXOL 350 MG/ML SOLN COMPARISON:  05/10/2009 FINDINGS: Lower chest: No acute abnormality. Hepatobiliary: Mild fatty infiltration of the liver is noted. Gallbladder is within normal limits. Pancreas: Unremarkable. No pancreatic ductal dilatation or surrounding inflammatory changes. Spleen: Area of decreased enhancement along the inferior tip which does not persist on delayed images likely representing a small hemangioma. Adrenals/Urinary Tract: Adrenal glands are within normal limits. Kidneys demonstrate scattered areas of decreased enhancement in the left kidney which demonstrate a persistent decreased enhancement on delayed images. Some slight enhancement along the margin is noted laterally in the left kidney raising the suspicion of a developing renal abscess. This area of peripheral enhancement measures approximately 3.3 x 2.1 cm in greatest dimension and is best visualized on image number 6 of series 8. Normal excretion is noted bilaterally. The bladder is decompressed. Stomach/Bowel: No obstructive or inflammatory changes of the colon are seen. The colon is somewhat distended with gas. The appendix is well visualized and within normal limits.  Small bowel and stomach are unremarkable. Vascular/Lymphatic: Aortic atherosclerosis. Persistent left common iliac artery occlusion is noted similar to that seen on prior exam from 2011. Reconstitution at the level of the proximal external iliac artery on the left is noted via pelvic collaterals in the left internal iliac artery. New aortoiliac stent graft is seen on the right which is widely patent. No significant adenopathy is noted. Reproductive: Prostate is unremarkable. Other: No free fluid is noted. No hernia is seen. In the area  of prior hernia repair however there is a peripherally enhancing air-fluid collection within the abdominal cavity just beneath the rectus muscles which extends along the anterior aspect of the right iliacus muscle and also appears to extend through a defect in the anterior abdominal wall to the subcutaneous tissues. The component within the abdominal cavity measures at least 8.3 x 2.1 cm and extends for at least 5.6 cm in craniocaudad projection. The component in the subcutaneous region measures approximately 3.3 x 3.0 cm and extends for 5.8 cm in craniocaudad projection. There is a tract which appears to intercommunicate between the 2 collections best seen on image number 77 of series 3. Musculoskeletal: Degenerative changes of lumbar spine are noted. No acute bony abnormality is seen. IMPRESSION: Changes in the right lower quadrant consistent with a multifocal abscess which extends from within the abdominal cavity through the abdominal wall into the subcutaneous tissues and extends to the skin surface. This is felt to represent infection of previously placed mesh for hernia repair. It also extends along the anterior aspect of the right iliacus muscle but does not appear to involve the muscle. Additionally there are changes in the left kidney which are suspicious for pyelonephritis and possible developing renal abscess laterally. Correlate with laboratory values. Persistent occlusion  of the left common iliac artery with reconstitution of the external iliac artery via pelvic collaterals. Electronically Signed   By: Alcide Clever M.D.   On: 01/05/2022 19:43    Review of Systems  Constitutional:  Negative for chills and fever.  HENT:  Negative for ear discharge, hearing loss and sore throat.   Eyes:  Negative for discharge.  Respiratory:  Negative for cough and shortness of breath.   Cardiovascular:  Negative for chest pain and leg swelling.  Gastrointestinal:  Negative for abdominal pain, constipation, diarrhea, nausea and vomiting.  Musculoskeletal:  Negative for myalgias and neck pain.  Skin:  Negative for rash.  Allergic/Immunologic: Negative for environmental allergies.  Neurological:  Negative for dizziness and seizures.  Hematological:  Does not bruise/bleed easily.  Psychiatric/Behavioral:  Negative for suicidal ideas.   All other systems reviewed and are negative.  Blood pressure 129/70, pulse (!) 102, temperature 99.3 F (37.4 C), temperature source Oral, resp. rate 15, height 5\' 11"  (1.803 m), weight 78 kg, SpO2 100 %. Physical Exam Constitutional:      Appearance: He is well-developed.     Comments: Conversant No acute distress  HENT:     Head: Normocephalic and atraumatic.  Eyes:     General: Lids are normal. No scleral icterus.    Pupils: Pupils are equal, round, and reactive to light.     Comments: Pupils are equal round and reactive No lid lag Moist conjunctiva  Neck:     Thyroid: No thyromegaly.     Trachea: No tracheal tenderness.     Comments: No cervical lymphadenopathy Cardiovascular:     Rate and Rhythm: Normal rate and regular rhythm.     Heart sounds: No murmur heard. Pulmonary:     Effort: Pulmonary effort is normal.     Breath sounds: Normal breath sounds. No wheezing or rales.  Abdominal:     Tenderness: There is abdominal tenderness.     Hernia: No hernia is present.     Comments: Right inguinal bulge, tenderness to palpation   Musculoskeletal:     Cervical back: Normal range of motion and neck supple.  Skin:    General: Skin is warm.     Findings: No  rash.     Nails: There is no clubbing.     Comments: Normal skin turgor  Neurological:     Mental Status: He is alert and oriented to person, place, and time.     Comments: Normal gait and station  Psychiatric:        Mood and Affect: Mood normal.        Thought Content: Thought content normal.        Judgment: Judgment normal.     Comments: Appropriate affect     Assessment/Plan: 66 year old male with history of seizures, alcohol abuse, hypertension, dyslipidemia, history of hepatitis, history of CVA. Patient with what appears to be a right inguinal abscess likely related to mesh infection. Patient with no leukocytosis at this time.  Recommend antibiotics, will likely get IR to place a drain to see if this helps salvage the mesh.  If not patient may require excision of mesh.  Recommend medical admission.  We will follow along.  Ralene Ok 01/05/2022, 9:36 PM

## 2022-01-05 NOTE — ED Provider Triage Note (Signed)
Emergency Medicine Provider Triage Evaluation Note  Dominic Carter , a 66 y.o. male  was evaluated in triage.  Pt complains of right lower abdominal pain since 1 week but has been worsening recently.  Has significant history of alcohol abuse.  Reports last drink on Tuesday..  Review of Systems  Positive: As above Negative: As above  Physical Exam  BP 117/83 (BP Location: Left Arm)   Pulse (!) 109   Temp (!) 97.4 F (36.3 C)   Resp 16   SpO2 98%  Gen:   Awake, no distress   Resp:  Normal effort  MSK:   Moves extremities without difficulty  Other:  Tenderness palpation of the abdomen predominantly in the right lower quadrant  Medical Decision Making  Medically screening exam initiated at 2:14 PM.  Appropriate orders placed.  Dominic Carter was informed that the remainder of the evaluation will be completed by another provider, this initial triage assessment does not replace that evaluation, and the importance of remaining in the ED until their evaluation is complete.     Dominic Courier, PA-C 01/05/22 1415

## 2022-01-05 NOTE — ED Notes (Signed)
PT temp 100.5. MD notified.

## 2022-01-05 NOTE — ED Provider Notes (Signed)
[facility] EMERGENCY DEPARTMENT Provider Note   CSN: [id number] Arrival date & time: 01/05/22  1354     History {Add pertinent medical, surgical, social history, OB history to HPI:1} Chief complaint: Groin pain.   Dominic Carter is a 66 y.o. male with h/o seizures, HTN, chronic R MCA stroke, hepatitis, h/o EtOH abuse, tobacco abuse presents with abdominal pain, groin pain.   Patient reports 1 week of ongoing right groin pain and swelling.  States that this feels like his hernia he had 7 years ago.  He reports that the area was so painful it caused him to double over in pain while walking on the street the other day.  He notes that the hernia does go inside sometimes.  He denies any fever/chills, chest pain, shortness of breath, diarrhea constipation, hematochezia/melena but endorses urinary frequency/dysuria.  He also reports that he ran out of his medications a while ago and has not been taking any.  HPI     Home Medications Prior to Admission medications   Medication Sig Start Date End Date Taking? Authorizing Provider  amLODipine (NORVASC) 10 MG tablet Take 1 tablet (10 mg total) by mouth daily. 07/12/21   Margarita Grizzle, MD  aspirin EC 81 MG EC tablet Take 1 tablet (81 mg total) by mouth daily. Swallow whole. Patient not taking: Reported on 08/28/2020 01/27/20   Maretta Bees, MD  folic acid (FOLVITE) 1 MG tablet Take 1 tablet (1 mg total) by mouth daily. Patient not taking: Reported on 08/10/2021 09/06/20   Karsten Ro, MD  levETIRAcetam (KEPPRA) 500 MG tablet Take 1 tablet (500 mg total) by mouth 2 (two) times daily. Patient not taking: Reported on 08/10/2021 11/26/20   Tilden Fossa, MD  melatonin 5 MG TABS Take 1 tablet (5 mg total) by mouth at bedtime. 09/07/21   Ghimire, Werner Lean, MD  Multiple Vitamin (MULTIVITAMIN WITH MINERALS) TABS tablet Take 1 tablet by mouth daily. Patient not taking: Reported on 08/10/2021 09/05/20   Karsten Ro, MD  nicotine  (NICODERM CQ - DOSED IN MG/24 HOURS) 21 mg/24hr patch Place 1 patch (21 mg total) onto the skin daily. Patient not taking: Reported on 08/28/2020 02/09/20   Medina-Vargas, Monina C, NP  pravastatin (PRAVACHOL) 20 MG tablet Take 1 tablet (20 mg total) by mouth daily. Patient not taking: Reported on 08/10/2021 09/06/20   Karsten Ro, MD  thiamine 100 MG tablet Take 1 tablet (100 mg total) by mouth daily. Patient not taking: Reported on 08/10/2021 09/06/20   Karsten Ro, MD      Allergies    Castor oil and Other    Review of Systems   Review of Systems Review of systems negative for fevers chills.  A 10 point review of systems was performed and is negative unless otherwise reported in HPI.  Physical Exam Updated Vital Signs BP 123/81 (BP Location: Right Arm)   Pulse (!) 110   Temp 98.9 F (37.2 C) (Oral)   Resp 15   Ht 5\' 11"  (1.803 m)   Wt 78 kg   SpO2 100%   BMI 23.98 kg/m  Physical Exam General: Normal appearing male, lying in bed.  HEENT: PERRLA, Sclera anicteric, MMM, trachea midline. Cardiology: RRR, no murmurs/rubs/gallops. BL radial and DP pulses equal bilaterally.  Resp: Normal respiratory rate and effort. CTAB, no wheezes, rhonchi, crackles.  Abd: Soft, non-tender, non-distended. No rebound tenderness or guarding.  GU: TTP of R inguinal region and R pelvic region. No obvious wounds or  purulent drainage. Swelling of the area appears like hernia that is not erythematous or extremely hard to palpation but is very tender. No scrotal swelling or testicular masses palpable. No crepitus palpable. Normal appearing circumcised penis. MSK: No peripheral edema or signs of trauma. Extremities without deformity or TTP. No cyanosis or clubbing. Skin: warm, dry. No rashes or lesions. Back: No CVA tenderness Neuro: A&Ox4, CNs II-XII grossly intact. MAEs. Sensation grossly intact.  Psych: Normal mood and affect.   ED Results / Procedures / Treatments   Labs (all labs ordered are listed, but  only abnormal results are displayed) Labs Reviewed  COMPREHENSIVE METABOLIC PANEL - Abnormal; Notable for the following components:      Result Value   Sodium 132 (*)    CO2 20 (*)    BUN 7 (*)    Calcium 8.6 (*)    Total Protein 8.9 (*)    Albumin 2.5 (*)    AST 54 (*)    All other components within normal limits  URINALYSIS, ROUTINE W REFLEX MICROSCOPIC - Abnormal; Notable for the following components:   Color, Urine AMBER (*)    APPearance HAZY (*)    Protein, ur 30 (*)    Bacteria, UA RARE (*)    All other components within normal limits  LIPASE, BLOOD  CBC    EKG None  Radiology CT ABDOMEN PELVIS W CONTRAST  Result Date: 01/05/2022 CLINICAL DATA:  Right groin pain, initial encounter EXAM: CT ABDOMEN AND PELVIS WITH CONTRAST TECHNIQUE: Multidetector CT imaging of the abdomen and pelvis was performed using the standard protocol following bolus administration of intravenous contrast. RADIATION DOSE REDUCTION: This exam was performed according to the departmental dose-optimization program which includes automated exposure control, adjustment of the mA and/or kV according to patient size and/or use of iterative reconstruction technique. CONTRAST:  41mL OMNIPAQUE IOHEXOL 350 MG/ML SOLN COMPARISON:  05/10/2009 FINDINGS: Lower chest: No acute abnormality. Hepatobiliary: Mild fatty infiltration of the liver is noted. Gallbladder is within normal limits. Pancreas: Unremarkable. No pancreatic ductal dilatation or surrounding inflammatory changes. Spleen: Area of decreased enhancement along the inferior tip which does not persist on delayed images likely representing a small hemangioma. Adrenals/Urinary Tract: Adrenal glands are within normal limits. Kidneys demonstrate scattered areas of decreased enhancement in the left kidney which demonstrate a persistent decreased enhancement on delayed images. Some slight enhancement along the margin is noted laterally in the left kidney raising the  suspicion of a developing renal abscess. This area of peripheral enhancement measures approximately 3.3 x 2.1 cm in greatest dimension and is best visualized on image number 6 of series 8. Normal excretion is noted bilaterally. The bladder is decompressed. Stomach/Bowel: No obstructive or inflammatory changes of the colon are seen. The colon is somewhat distended with gas. The appendix is well visualized and within normal limits. Small bowel and stomach are unremarkable. Vascular/Lymphatic: Aortic atherosclerosis. Persistent left common iliac artery occlusion is noted similar to that seen on prior exam from 2011. Reconstitution at the level of the proximal external iliac artery on the left is noted via pelvic collaterals in the left internal iliac artery. New aortoiliac stent graft is seen on the right which is widely patent. No significant adenopathy is noted. Reproductive: Prostate is unremarkable. Other: No free fluid is noted. No hernia is seen. In the area of prior hernia repair however there is a peripherally enhancing air-fluid collection within the abdominal cavity just beneath the rectus muscles which extends along the anterior aspect of the  right iliacus muscle and also appears to extend through a defect in the anterior abdominal wall to the subcutaneous tissues. The component within the abdominal cavity measures at least 8.3 x 2.1 cm and extends for at least 5.6 cm in craniocaudad projection. The component in the subcutaneous region measures approximately 3.3 x 3.0 cm and extends for 5.8 cm in craniocaudad projection. There is a tract which appears to intercommunicate between the 2 collections best seen on image number 77 of series 3. Musculoskeletal: Degenerative changes of lumbar spine are noted. No acute bony abnormality is seen. IMPRESSION: Changes in the right lower quadrant consistent with a multifocal abscess which extends from within the abdominal cavity through the abdominal wall into the  subcutaneous tissues and extends to the skin surface. This is felt to represent infection of previously placed mesh for hernia repair. It also extends along the anterior aspect of the right iliacus muscle but does not appear to involve the muscle. Additionally there are changes in the left kidney which are suspicious for pyelonephritis and possible developing renal abscess laterally. Correlate with laboratory values. 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 Electronically Signed   By: Inez Catalina M.D.   On: 01/05/2022 19:43    Procedures Procedures  {Document cardiac monitor, telemetry assessment procedure when appropriate:1}  Medications Ordered in ED Medications  LORazepam (ATIVAN) tablet 1-4 mg (has no administration in time range)    Or  LORazepam (ATIVAN) injection 1-4 mg (has no administration in time range)  iohexol (OMNIPAQUE) 350 MG/ML injection 75 mL (75 mLs Intravenous Contrast Given 01/05/22 1914)    ED Course/ Medical Decision Making/ A&P                          Medical Decision Making Amount and/or Complexity of Data Reviewed Labs: ordered. Decision-making details documented in ED Course. Radiology:  Decision-making details documented in ED Course.  Risk Decision regarding hospitalization.    @HNNMDM @ ***  I have personally reviewed and interpreted all labs and imaging.   Clinical Course as of 01/05/22 2022  Nancy Fetter Jan 05, 2022  2000 CT ABDOMEN PELVIS W CONTRAST Changes in the right lower quadrant consistent with a multifocal abscess which extends from within the abdominal cavity through the abdominal wall into the subcutaneous tissues and extends to the skin surface. This is felt to represent infection of previously placed mesh for hernia repair. It also extends along the anterior aspect of the right iliacus muscle but does not appear to involve the muscle.  Additionally there are  changes in the left kidney which are suspicious for pyelonephritis and possible developing renal abscess laterally. Correlate with laboratory values.  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 [HN]  2003 WBC: 7.8 No leukocytosis [HN]    Clinical Course User Index [HN] Audley Hose, MD    {Document critical care time when appropriate:1} {Document review of labs and clinical decision tools ie heart score, Chads2Vasc2 etc:1}  {Document your independent review of radiology images, and any outside records:1} {Document your discussion with family members, caretakers, and with consultants:1} {Document social determinants of health affecting pt's care:1} {Document your decision making why or why not admission, treatments were needed:1} Final Clinical Impression(s) / ED Diagnoses Final diagnoses:  None    Rx / DC Orders ED Discharge Orders  None        This note was created using dictation software, which may contain spelling or grammatical errors.

## 2022-01-06 ENCOUNTER — Encounter (HOSPITAL_COMMUNITY): Payer: Self-pay | Admitting: Internal Medicine

## 2022-01-06 ENCOUNTER — Inpatient Hospital Stay (HOSPITAL_COMMUNITY): Payer: Medicare Other | Admitting: Anesthesiology

## 2022-01-06 ENCOUNTER — Other Ambulatory Visit: Payer: Self-pay

## 2022-01-06 ENCOUNTER — Encounter (HOSPITAL_COMMUNITY)
Admission: EM | Disposition: A | Discharge status: Home / Self Care | Payer: Self-pay | Source: Home / Self Care | Attending: Family Medicine

## 2022-01-06 DIAGNOSIS — Z7982 Long term (current) use of aspirin: Secondary | ICD-10-CM | POA: Diagnosis not present

## 2022-01-06 DIAGNOSIS — N12 Tubulo-interstitial nephritis, not specified as acute or chronic: Secondary | ICD-10-CM | POA: Diagnosis present

## 2022-01-06 DIAGNOSIS — Z89511 Acquired absence of right leg below knee: Secondary | ICD-10-CM | POA: Diagnosis not present

## 2022-01-06 DIAGNOSIS — Z8673 Personal history of transient ischemic attack (TIA), and cerebral infarction without residual deficits: Secondary | ICD-10-CM | POA: Diagnosis not present

## 2022-01-06 DIAGNOSIS — Z86718 Personal history of other venous thrombosis and embolism: Secondary | ICD-10-CM | POA: Diagnosis not present

## 2022-01-06 DIAGNOSIS — L02214 Cutaneous abscess of groin: Secondary | ICD-10-CM

## 2022-01-06 DIAGNOSIS — Z8249 Family history of ischemic heart disease and other diseases of the circulatory system: Secondary | ICD-10-CM | POA: Diagnosis not present

## 2022-01-06 DIAGNOSIS — F1010 Alcohol abuse, uncomplicated: Secondary | ICD-10-CM | POA: Diagnosis present

## 2022-01-06 DIAGNOSIS — N151 Renal and perinephric abscess: Secondary | ICD-10-CM | POA: Diagnosis present

## 2022-01-06 DIAGNOSIS — Z716 Tobacco abuse counseling: Secondary | ICD-10-CM | POA: Diagnosis not present

## 2022-01-06 DIAGNOSIS — E785 Hyperlipidemia, unspecified: Secondary | ICD-10-CM | POA: Diagnosis present

## 2022-01-06 DIAGNOSIS — T8579XA Infection and inflammatory reaction due to other internal prosthetic devices, implants and grafts, initial encounter: Secondary | ICD-10-CM | POA: Diagnosis present

## 2022-01-06 DIAGNOSIS — Z91148 Patient's other noncompliance with medication regimen for other reason: Secondary | ICD-10-CM | POA: Diagnosis not present

## 2022-01-06 DIAGNOSIS — B954 Other streptococcus as the cause of diseases classified elsewhere: Secondary | ICD-10-CM | POA: Diagnosis present

## 2022-01-06 DIAGNOSIS — Z888 Allergy status to other drugs, medicaments and biological substances status: Secondary | ICD-10-CM | POA: Diagnosis not present

## 2022-01-06 DIAGNOSIS — Y832 Surgical operation with anastomosis, bypass or graft as the cause of abnormal reaction of the patient, or of later complication, without mention of misadventure at the time of the procedure: Secondary | ICD-10-CM | POA: Diagnosis present

## 2022-01-06 DIAGNOSIS — I745 Embolism and thrombosis of iliac artery: Secondary | ICD-10-CM | POA: Diagnosis present

## 2022-01-06 DIAGNOSIS — Z79899 Other long term (current) drug therapy: Secondary | ICD-10-CM | POA: Diagnosis not present

## 2022-01-06 DIAGNOSIS — I10 Essential (primary) hypertension: Secondary | ICD-10-CM

## 2022-01-06 DIAGNOSIS — B9620 Unspecified Escherichia coli [E. coli] as the cause of diseases classified elsewhere: Secondary | ICD-10-CM | POA: Diagnosis present

## 2022-01-06 DIAGNOSIS — D649 Anemia, unspecified: Secondary | ICD-10-CM | POA: Diagnosis not present

## 2022-01-06 DIAGNOSIS — F17210 Nicotine dependence, cigarettes, uncomplicated: Secondary | ICD-10-CM | POA: Diagnosis present

## 2022-01-06 DIAGNOSIS — G40909 Epilepsy, unspecified, not intractable, without status epilepticus: Secondary | ICD-10-CM | POA: Diagnosis present

## 2022-01-06 HISTORY — PX: IRRIGATION AND DEBRIDEMENT ABSCESS: SHX5252

## 2022-01-06 LAB — CBC
HCT: 34.5 % — ABNORMAL LOW (ref 39.0–52.0)
Hemoglobin: 11.3 g/dL — ABNORMAL LOW (ref 13.0–17.0)
MCH: 28.8 pg (ref 26.0–34.0)
MCHC: 32.8 g/dL (ref 30.0–36.0)
MCV: 87.8 fL (ref 80.0–100.0)
Platelets: 240 10*3/uL (ref 150–400)
RBC: 3.93 MIL/uL — ABNORMAL LOW (ref 4.22–5.81)
RDW: 14.8 % (ref 11.5–15.5)
WBC: 11.7 10*3/uL — ABNORMAL HIGH (ref 4.0–10.5)
nRBC: 0 % (ref 0.0–0.2)

## 2022-01-06 LAB — COMPREHENSIVE METABOLIC PANEL
ALT: 24 U/L (ref 0–44)
AST: 32 U/L (ref 15–41)
Albumin: 1.9 g/dL — ABNORMAL LOW (ref 3.5–5.0)
Alkaline Phosphatase: 55 U/L (ref 38–126)
Anion gap: 10 (ref 5–15)
BUN: 7 mg/dL — ABNORMAL LOW (ref 8–23)
CO2: 21 mmol/L — ABNORMAL LOW (ref 22–32)
Calcium: 8.4 mg/dL — ABNORMAL LOW (ref 8.9–10.3)
Chloride: 103 mmol/L (ref 98–111)
Creatinine, Ser: 0.76 mg/dL (ref 0.61–1.24)
GFR, Estimated: >60 mL/min (ref >60)
Glucose, Bld: 103 mg/dL — ABNORMAL HIGH (ref 70–99)
Potassium: 3.8 mmol/L (ref 3.5–5.1)
Sodium: 134 mmol/L — ABNORMAL LOW (ref 135–145)
Total Bilirubin: 0.6 mg/dL (ref 0.3–1.2)
Total Protein: 6.9 g/dL (ref 6.5–8.1)

## 2022-01-06 LAB — URINE CULTURE: Culture: NO GROWTH

## 2022-01-06 LAB — CBG MONITORING, ED: Glucose-Capillary: 101 mg/dL — ABNORMAL HIGH (ref 70–99)

## 2022-01-06 SURGERY — IRRIGATION AND DEBRIDEMENT ABSCESS
Anesthesia: General | Site: Groin | Laterality: Right

## 2022-01-06 MED ORDER — OXYCODONE HCL 5 MG/5ML PO SOLN: 5.0000 mg | Freq: Once | ORAL | Status: DC | PRN

## 2022-01-06 MED ORDER — ORAL CARE MOUTH RINSE: 15.0000 mL | Freq: Once | OROMUCOSAL | Status: AC

## 2022-01-06 MED ORDER — ACETAMINOPHEN 500 MG PO TABS: 1000.0000 mg | ORAL_TABLET | Freq: Once | ORAL | Status: AC

## 2022-01-06 MED ORDER — BUPIVACAINE HCL (PF) 0.25 % IJ SOLN
INTRAMUSCULAR | Status: AC
  Filled 2022-01-06: qty 30

## 2022-01-06 MED ORDER — VANCOMYCIN HCL 1500 MG/300ML IV SOLN
1500.0000 mg | Freq: Once | INTRAVENOUS | Status: AC
  Administered 2022-01-06: 1500 mg via INTRAVENOUS
  Filled 2022-01-06: qty 300

## 2022-01-06 MED ORDER — LEVETIRACETAM 500 MG PO TABS
500.0000 mg | ORAL_TABLET | Freq: Two times a day (BID) | ORAL | Status: DC
  Administered 2022-01-06 – 2022-01-10 (×9): 500 mg via ORAL
  Filled 2022-01-06 (×9): qty 1

## 2022-01-06 MED ORDER — FENTANYL CITRATE (PF) 100 MCG/2ML IJ SOLN
INTRAMUSCULAR | Status: AC
  Filled 2022-01-06: qty 2

## 2022-01-06 MED ORDER — DEXAMETHASONE SODIUM PHOSPHATE 10 MG/ML IJ SOLN
INTRAMUSCULAR | Status: AC
  Filled 2022-01-06: qty 1

## 2022-01-06 MED ORDER — DEXAMETHASONE SODIUM PHOSPHATE 10 MG/ML IJ SOLN
INTRAMUSCULAR | Status: DC | PRN
  Administered 2022-01-06: 10 mg via INTRAVENOUS

## 2022-01-06 MED ORDER — OXYCODONE HCL 5 MG PO TABS: 5.0000 mg | ORAL_TABLET | Freq: Once | ORAL | Status: DC | PRN

## 2022-01-06 MED ORDER — MIDAZOLAM HCL 2 MG/2ML IJ SOLN
INTRAMUSCULAR | Status: AC
  Filled 2022-01-06: qty 2

## 2022-01-06 MED ORDER — ACETAMINOPHEN 325 MG PO TABS: 650.0000 mg | ORAL_TABLET | Freq: Four times a day (QID) | ORAL | Status: DC | PRN

## 2022-01-06 MED ORDER — PHENYLEPHRINE 80 MCG/ML (10ML) SYRINGE FOR IV PUSH (FOR BLOOD PRESSURE SUPPORT)
PREFILLED_SYRINGE | INTRAVENOUS | Status: DC | PRN
  Administered 2022-01-06 (×2): 160 ug via INTRAVENOUS

## 2022-01-06 MED ORDER — CHLORHEXIDINE GLUCONATE 0.12 % MT SOLN
OROMUCOSAL | Status: AC
  Administered 2022-01-06: 15 mL via OROMUCOSAL
  Filled 2022-01-06: qty 15

## 2022-01-06 MED ORDER — VANCOMYCIN HCL IN DEXTROSE 1-5 GM/200ML-% IV SOLN
1000.0000 mg | Freq: Two times a day (BID) | INTRAVENOUS | Status: DC
  Administered 2022-01-07 – 2022-01-08 (×4): 1000 mg via INTRAVENOUS
  Filled 2022-01-06 (×4): qty 200

## 2022-01-06 MED ORDER — THIAMINE HCL 100 MG/ML IJ SOLN: 100.0000 mg | Freq: Every day | INTRAMUSCULAR | Status: DC

## 2022-01-06 MED ORDER — FENTANYL CITRATE (PF) 100 MCG/2ML IJ SOLN
25.0000 ug | INTRAMUSCULAR | Status: DC | PRN
  Administered 2022-01-06 (×2): 25 ug via INTRAVENOUS

## 2022-01-06 MED ORDER — ACETAMINOPHEN 650 MG RE SUPP: 650.0000 mg | Freq: Four times a day (QID) | RECTAL | Status: DC | PRN

## 2022-01-06 MED ORDER — MORPHINE SULFATE (PF) 2 MG/ML IV SOLN: 1.0000 mg | INTRAVENOUS | Status: DC | PRN

## 2022-01-06 MED ORDER — ACETAMINOPHEN 500 MG PO TABS
ORAL_TABLET | ORAL | Status: AC
  Administered 2022-01-06: 1000 mg via ORAL
  Filled 2022-01-06: qty 2

## 2022-01-06 MED ORDER — LIDOCAINE 2% (20 MG/ML) 5 ML SYRINGE
INTRAMUSCULAR | Status: AC
  Filled 2022-01-06: qty 5

## 2022-01-06 MED ORDER — SENNOSIDES-DOCUSATE SODIUM 8.6-50 MG PO TABS: 1.0000 | ORAL_TABLET | Freq: Every evening | ORAL | Status: DC | PRN

## 2022-01-06 MED ORDER — FOLIC ACID 1 MG PO TABS
1.0000 mg | ORAL_TABLET | Freq: Every day | ORAL | Status: DC
  Administered 2022-01-06 – 2022-01-10 (×5): 1 mg via ORAL
  Filled 2022-01-06 (×5): qty 1

## 2022-01-06 MED ORDER — BUPIVACAINE HCL (PF) 0.25 % IJ SOLN
INTRAMUSCULAR | Status: DC | PRN
  Administered 2022-01-06: 4 mL

## 2022-01-06 MED ORDER — OXYCODONE HCL 5 MG PO TABS
5.0000 mg | ORAL_TABLET | ORAL | Status: DC | PRN
  Administered 2022-01-06 – 2022-01-10 (×6): 5 mg via ORAL
  Filled 2022-01-06 (×6): qty 1

## 2022-01-06 MED ORDER — ONDANSETRON HCL 4 MG/2ML IJ SOLN
INTRAMUSCULAR | Status: DC | PRN
  Administered 2022-01-06: 4 mg via INTRAVENOUS

## 2022-01-06 MED ORDER — PROPOFOL 10 MG/ML IV BOLUS
INTRAVENOUS | Status: AC
  Filled 2022-01-06: qty 20

## 2022-01-06 MED ORDER — PIPERACILLIN-TAZOBACTAM 3.375 G IVPB
3.3750 g | Freq: Three times a day (TID) | INTRAVENOUS | Status: DC
  Administered 2022-01-06 – 2022-01-10 (×14): 3.375 g via INTRAVENOUS
  Filled 2022-01-06 (×13): qty 50

## 2022-01-06 MED ORDER — PIPERACILLIN-TAZOBACTAM 3.375 G IVPB 30 MIN: 3.3750 g | Freq: Once | INTRAVENOUS | Status: DC

## 2022-01-06 MED ORDER — ONDANSETRON HCL 4 MG PO TABS
4.0000 mg | ORAL_TABLET | Freq: Four times a day (QID) | ORAL | Status: DC | PRN
  Administered 2022-01-06: 4 mg via ORAL
  Filled 2022-01-06: qty 1

## 2022-01-06 MED ORDER — ONDANSETRON HCL 4 MG/2ML IJ SOLN: 4.0000 mg | Freq: Four times a day (QID) | INTRAMUSCULAR | Status: DC | PRN

## 2022-01-06 MED ORDER — FENTANYL CITRATE (PF) 100 MCG/2ML IJ SOLN
INTRAMUSCULAR | Status: DC | PRN
  Administered 2022-01-06: 50 ug via INTRAVENOUS

## 2022-01-06 MED ORDER — PROPOFOL 10 MG/ML IV BOLUS
INTRAVENOUS | Status: DC | PRN
  Administered 2022-01-06: 50 mg via INTRAVENOUS
  Administered 2022-01-06: 110 mg via INTRAVENOUS

## 2022-01-06 MED ORDER — SODIUM CHLORIDE 0.9% FLUSH
3.0000 mL | Freq: Two times a day (BID) | INTRAVENOUS | Status: DC
  Administered 2022-01-06 – 2022-01-10 (×7): 3 mL via INTRAVENOUS

## 2022-01-06 MED ORDER — 0.9 % SODIUM CHLORIDE (POUR BTL) OPTIME
TOPICAL | Status: DC | PRN
  Administered 2022-01-06: 1000 mL

## 2022-01-06 MED ORDER — THIAMINE MONONITRATE 100 MG PO TABS
100.0000 mg | ORAL_TABLET | Freq: Every day | ORAL | Status: DC
  Administered 2022-01-06 – 2022-01-10 (×5): 100 mg via ORAL
  Filled 2022-01-06 (×5): qty 1

## 2022-01-06 MED ORDER — AMISULPRIDE (ANTIEMETIC) 5 MG/2ML IV SOLN: 10.0000 mg | Freq: Once | INTRAVENOUS | Status: DC | PRN

## 2022-01-06 MED ORDER — ONDANSETRON HCL 4 MG/2ML IJ SOLN
INTRAMUSCULAR | Status: AC
  Filled 2022-01-06: qty 2

## 2022-01-06 MED ORDER — LACTATED RINGERS IV SOLN: INTRAVENOUS | Status: DC

## 2022-01-06 MED ORDER — NICOTINE 21 MG/24HR TD PT24
21.0000 mg | MEDICATED_PATCH | Freq: Every day | TRANSDERMAL | Status: DC
  Administered 2022-01-06 – 2022-01-10 (×5): 21 mg via TRANSDERMAL
  Filled 2022-01-06 (×5): qty 1

## 2022-01-06 MED ORDER — LIDOCAINE 2% (20 MG/ML) 5 ML SYRINGE
INTRAMUSCULAR | Status: DC | PRN
  Administered 2022-01-06: 60 mg via INTRAVENOUS

## 2022-01-06 MED ORDER — CHLORHEXIDINE GLUCONATE 0.12 % MT SOLN
15.0000 mL | Freq: Once | OROMUCOSAL | Status: AC
  Filled 2022-01-06: qty 15

## 2022-01-06 MED ORDER — ADULT MULTIVITAMIN W/MINERALS CH
1.0000 | ORAL_TABLET | Freq: Every day | ORAL | Status: DC
  Administered 2022-01-06 – 2022-01-10 (×5): 1 via ORAL
  Filled 2022-01-06 (×5): qty 1

## 2022-01-06 MED ORDER — LACTATED RINGERS IV SOLN: INTRAVENOUS | Status: AC

## 2022-01-06 MED ORDER — PRAVASTATIN SODIUM 10 MG PO TABS
20.0000 mg | ORAL_TABLET | Freq: Every day | ORAL | Status: DC
  Administered 2022-01-06 – 2022-01-09 (×4): 20 mg via ORAL
  Filled 2022-01-06 (×4): qty 2

## 2022-01-06 MED ORDER — FENTANYL CITRATE (PF) 250 MCG/5ML IJ SOLN
INTRAMUSCULAR | Status: AC
  Filled 2022-01-06: qty 5

## 2022-01-06 SURGICAL SUPPLY — 37 items
BAG COUNTER SPONGE SURGICOUNT (BAG) ×1 IMPLANT
BNDG GAUZE DERMACEA FLUFF 4 (GAUZE/BANDAGES/DRESSINGS) IMPLANT
CANISTER SUCT 3000ML PPV (MISCELLANEOUS) IMPLANT
CHLORAPREP W/TINT 26 (MISCELLANEOUS) IMPLANT
CLEANER TIP ELECTROSURG 2X2 (MISCELLANEOUS) ×1 IMPLANT
COVER SURGICAL LIGHT HANDLE (MISCELLANEOUS) ×1 IMPLANT
DRAPE HALF SHEET 40X57 (DRAPES) IMPLANT
DRAPE LAPAROSCOPIC ABDOMINAL (DRAPES) ×1 IMPLANT
ELECT REM PT RETURN 9FT ADLT (ELECTROSURGICAL) ×1
ELECTRODE REM PT RTRN 9FT ADLT (ELECTROSURGICAL) ×1 IMPLANT
GAUZE 4X4 16PLY ~~LOC~~+RFID DBL (SPONGE) IMPLANT
GAUZE PACKING IODOFORM 1/2INX (GAUZE/BANDAGES/DRESSINGS) IMPLANT
GAUZE PAD ABD 8X10 STRL (GAUZE/BANDAGES/DRESSINGS) IMPLANT
GAUZE SPONGE 4X4 12PLY STRL (GAUZE/BANDAGES/DRESSINGS) ×1 IMPLANT
GLOVE BIO SURGEON STRL SZ7 (GLOVE) ×1 IMPLANT
GLOVE BIOGEL PI IND STRL 7.5 (GLOVE) ×1 IMPLANT
GOWN STRL REUS W/ TWL LRG LVL3 (GOWN DISPOSABLE) ×2 IMPLANT
GOWN STRL REUS W/TWL LRG LVL3 (GOWN DISPOSABLE) ×2
KIT BASIN OR (CUSTOM PROCEDURE TRAY) ×1 IMPLANT
KIT TURNOVER KIT B (KITS) ×1 IMPLANT
NDL HYPO 25GX1X1/2 BEV (NEEDLE) IMPLANT
NEEDLE HYPO 25GX1X1/2 BEV (NEEDLE) IMPLANT
NS IRRIG 1000ML POUR BTL (IV SOLUTION) ×1 IMPLANT
PACK GENERAL/GYN (CUSTOM PROCEDURE TRAY) ×1 IMPLANT
PACKING GAUZE IODOFORM 1/2INX5 (GAUZE/BANDAGES/DRESSINGS) ×1
PAD ARMBOARD 7.5X6 YLW CONV (MISCELLANEOUS) ×2 IMPLANT
PENCIL SMOKE EVACUATOR (MISCELLANEOUS) ×1 IMPLANT
SPECIMEN JAR SMALL (MISCELLANEOUS) ×1 IMPLANT
SPIKE FLUID TRANSFER (MISCELLANEOUS) IMPLANT
SUT MNCRL AB 4-0 PS2 18 (SUTURE) IMPLANT
SUT VIC AB 3-0 SH 27 (SUTURE)
SUT VIC AB 3-0 SH 27XBRD (SUTURE) IMPLANT
SWAB COLLECTION DEVICE MRSA (MISCELLANEOUS) IMPLANT
SWAB CULTURE ESWAB REG 1ML (MISCELLANEOUS) IMPLANT
SYR CONTROL 10ML LL (SYRINGE) IMPLANT
TOWEL GREEN STERILE (TOWEL DISPOSABLE) ×1 IMPLANT
TOWEL GREEN STERILE FF (TOWEL DISPOSABLE) ×1 IMPLANT

## 2022-01-06 NOTE — Progress Notes (Signed)
Patient arrived to [place] room 30 alert and oriented x4. Pain level 4/10. Bed in lowest position. Call light in reach. Will continue to monitor pt.

## 2022-01-06 NOTE — H&P (Signed)
History and Physical    Dominic Carter:[id number] DOB: 15-Oct-1955 DOA: 01/05/2022  PCP: Simona Huh, NP  Patient coming from: Home via EMS  I have personally briefly reviewed patient's old medical records in [place]  Chief Complaint: Right groin pain  HPI: Dominic Carter is a 66 y.o. male with medical history significant for alcohol use disorder, seizure disorder, history of CVA, HTN, HLD, s/p right BKA, hx of bilateral inguinal hernia repair with mesh in [place] by Dr. Demetrius Revel 05/2014 who presented to the ED for evaluation of right groin pain.  Patient presented to the ED with significant right groin pain and swelling.  This felt like his hernia that he had 7 years ago.  Pain has been ongoing for the last week.  He also reports recent urinary frequency with dysuria.  Patient reports previously drinking at least two 40 ounce beers daily but states that he quit drinking cold [place] 4 days ago however told triage RN he quit 2 days ago.  He says he ran out of his medications and has not been taking any of his meds for some time now.  ED Course  Labs/Imaging on admission: I have personally reviewed following labs and imaging studies.  Initial vitals showed BP 117/83, pulse 109, RR 16, temp 97.4 F, SPO2 98% on room air.  Tmax 100.5 F while in the ED.  Labs show WBC 7.8, hemoglobin 14.6, platelets 255,000, sodium 132, potassium 4.2, bicarb 20, BUN 7, creatinine 0.7, serum glucose 95, AST 54, ALT 31, alk phos 76, total bilirubin 0.8, lipase 28, lactic acid 1.6.  Urinalysis shows negative nitrates, no leukocytes, 0-5 RBCs and WBCs, rare bacteria microscopy.  Blood and urine cultures in process.  CT abdomen/pelvis with contrast showed a right lower quadrant multifocal abscess which was felt to represent infection of previously placed mesh for hernia repair.  Changes in the left kidney suspicious for pyelonephritis and possible developing renal abscess laterally also  noted.  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  Patient was given 1 L LR, IV Zosyn.  General surgery were consulted and have placed IR consult for drain.  Medical admission recommended and the hospitalist service was consulted to admit for further evaluation and management.  Review of Systems: All systems reviewed and are negative except as documented in history of present illness above.   Past Medical History:  Diagnosis Date   DVT (deep venous thrombosis) (HCC)    Dyslipidemia    ETOH abuse    Hx of BKA, right (HCC)    Hypertension    Seizure disorder ([place])     Past Surgical History:  Procedure Laterality Date   BELOW KNEE LEG AMPUTATION Right     Social History:  reports that he has been smoking cigarettes. He has been smoking an average of 1 pack per day. He has never used smokeless tobacco. He reports current alcohol use. He reports that he does not use drugs.  Allergies  Allergen Reactions   Castor Oil Nausea And Vomiting   Other Nausea And Vomiting    Boiled Fat back meat    Family History  Problem Relation Age of Onset   Hypertension Mother    Hypertension Sister    Hypertension Brother    Hypertension Maternal Grandmother    Hypertension Maternal Grandfather      Prior to Admission medications   Medication Sig Start Date End  Date Taking? Authorizing Provider  amLODipine (NORVASC) 10 MG tablet Take 1 tablet (10 mg total) by mouth daily. 07/12/21   [name], MD  aspirin EC 81 MG EC tablet Take 1 tablet (81 mg total) by mouth daily. Swallow whole. Patient not taking: Reported on 08/28/2020 01/27/20   Jonetta Osgood, MD  folic acid (FOLVITE) 1 MG tablet Take 1 tablet (1 mg total) by mouth daily. Patient not taking: Reported on 08/10/2021 09/06/20   Armando Reichert, MD  levETIRAcetam (KEPPRA) 500 MG tablet Take 1 tablet (500 mg total) by mouth 2 (two) times  daily. Patient not taking: Reported on 08/10/2021 11/26/20   Quintella Reichert, MD  melatonin 5 MG TABS Take 1 tablet (5 mg total) by mouth at bedtime. 09/07/21   Ghimire, Henreitta Leber, MD  Multiple Vitamin (MULTIVITAMIN WITH MINERALS) TABS tablet Take 1 tablet by mouth daily. Patient not taking: Reported on 08/10/2021 09/05/20   Armando Reichert, MD  nicotine (NICODERM CQ - DOSED IN MG/24 HOURS) 21 mg/24hr patch Place 1 patch (21 mg total) onto the skin daily. Patient not taking: Reported on 08/28/2020 02/09/20   Medina-Vargas, Monina C, NP  pravastatin (PRAVACHOL) 20 MG tablet Take 1 tablet (20 mg total) by mouth daily. Patient not taking: Reported on 08/10/2021 09/06/20   Armando Reichert, MD  thiamine 100 MG tablet Take 1 tablet (100 mg total) by mouth daily. Patient not taking: Reported on 08/10/2021 09/06/20   Armando Reichert, MD    Physical Exam: Vitals:   01/05/22 2223 01/05/22 2235 01/06/22 0112 01/06/22 0114  BP: 129/70 (!) 115/56 118/79   Pulse: 99 88 89   Resp:  16 16   Temp:  (!) 100.5 F (38.1 C)  98.8 F (37.1 C)  TempSrc:  Oral  Oral  SpO2:  100% 99%   Weight:      Height:       Constitutional: Resting in bed in the left lateral decubitus position.  NAD, calm. Eyes: EOMI, lids and conjunctivae normal ENMT: Mucous membranes are dry. Posterior pharynx clear of any exudate or lesions.Normal dentition.  Neck: normal, supple, no masses. Respiratory: clear to auscultation bilaterally, no wheezing, no crackles. Normal respiratory effort. No accessory muscle use.  Cardiovascular: Regular rate and rhythm, no murmurs / rubs / gallops. No extremity edema.  Abdomen: Right inguinal bulge significantly tender to very light palpation Musculoskeletal: S/p right BKA.  No clubbing / cyanosis. Good ROM, no contractures. Normal muscle tone.  Skin: no rashes, lesions, ulcers. No induration Neurologic: Sensation intact. Strength 5/5 in all 4.  Psychiatric: Alert and oriented x 3.  EKG: Not  performed.  Assessment/Plan Principal Problem:   Right inguinal abscess Active Problems:   Pyelonephritis of left kidney   ETOH abuse   Seizure disorder (HCC)   HTN (hypertension)   History of CVA (cerebrovascular accident)   Tobacco abuse   Dyslipidemia   Dominic Carter is a 66 y.o. male with medical history significant for alcohol use disorder, seizure disorder, history of CVA, HTN, HLD, s/p right BKA, hx of bilateral inguinal hernia repair with mesh in [place] by Dr. Demetrius Revel 05/2014 who is admitted with right inguinal abscess related to mesh as well as left pyelonephritis with possible developing renal abscess.  Assessment and Plan: * Right inguinal abscess -Felt related to mesh infection -General surgery following -IR consult for drain placed by general surgery -Continue IV Zosyn -Add vancomycin pending further culture results -Follow blood cultures -Keep n.p.o. after midnight -IV analgesics  and antiemetics as needed  Pyelonephritis of left kidney Although urinalysis not strongly suggestive of UTI CT shows changes suggestive of left pyelonephritis with possible developing renal abscess.  Patient does report dysuria and urinary frequency. -Continue IV Zosyn -Add vancomycin pending further culture data -Follow urine and blood cultures -Continue IV fluid hydration overnight  ETOH abuse Previously drinking at least two 40 oz beers daily.  He reports quitting drinking sometime in the past week.  Does not appear to be acutely withdrawing at time of admission. -Place on CIWA protocol with Ativan as needed  Seizure disorder ([place]) States not taking medications as he ran out of them. -Restart Keppra 500 mg BID  HTN (hypertension) BP stable, restart amlodipine if needed.  History of CVA (cerebrovascular accident) Aspirin on hold for now.  Restart statin.  Tobacco abuse Nicotine patch provided.  Dyslipidemia Continue pravastatin.  DVT prophylaxis: SCDs Start:  01/06/22 0038 Code Status: Full code, discussed with patient on admission Family Communication: Discussed with patient, he has discussed with family Disposition Plan: From home, dispo pending clinical progress Consults called: General surgery, IR Severity of Illness: The appropriate patient status for this patient is INPATIENT. Inpatient status is judged to be reasonable and necessary in order to provide the required intensity of service to ensure the patient's safety. The patient's presenting symptoms, physical exam findings, and initial radiographic and laboratory data in the context of their chronic comorbidities is felt to place them at high risk for further clinical deterioration. Furthermore, it is not anticipated that the patient will be medically stable for discharge from the hospital within 2 midnights of admission.   * I certify that at the point of admission it is my clinical judgment that the patient will require inpatient hospital care spanning beyond 2 midnights from the point of admission due to high intensity of service, high risk for further deterioration and high frequency of surveillance required.Zada Finders MD [facility]  If 7PM-7AM, please contact night-coverage [email or web address]  01/06/2022, 1:29 AM

## 2022-01-06 NOTE — Assessment & Plan Note (Addendum)
-   Continue Keppra - Refills at d/c

## 2022-01-06 NOTE — Assessment & Plan Note (Addendum)
Smoking cessation recommended 

## 2022-01-06 NOTE — Progress Notes (Signed)
  Progress Note   Patient: Dominic Carter FSF:[id number] DOB: 1955/07/25 DOA: 01/05/2022     0 DOS: the patient was seen and examined on 01/06/2022 at 9:16AM      Brief hospital course: Dominic Carter is a 66 y.o. M with hx alcohol use disorder, seizure disorder, history of CVA, HTN, HLD, s/p right BKA, hx of bilateral inguinal hernia repair with mesh in [place] by Dr. Demetrius Revel 05/2014 who is admitted with right inguinal abscess related to mesh as well as left pyelonephritis with possible developing renal abscess.     Assessment and Plan: * Right inguinal abscess S/p Right inguinal abscess incision and drainage To the OR today with Dr. Donne Hazel - Continue Zosyn  Pyelonephritis of left kidney - Continue Vanc and ZOsyn - Follow urine culture  ETOH abuse - Continue CIWA and on demand lorazepam - Continue thiamine and folate  Seizure disorder (HCC) - Continue Keppra - Refills at d/c  HTN (hypertension) BP normal - Hold amlodipine for now  History of CVA (cerebrovascular accident) - Hold aspirin today - Resume statin  Tobacco abuse Smoking cessation recommended   Dyslipidemia - Continue pravastatin               Physical Exam: BP 119/81 (BP Location: Right Arm)   Pulse 71   Temp 97.8 F (36.6 C)   Resp 16   Ht 5\' 11"  (1.803 m)   Wt 74.8 kg   SpO2 100%   BMI 23.01 kg/m     Data Reviewed: This is a no charge note, for further details, please see H&P by my partner from earlier today  Family Communication:     Disposition: Status is: Inpatient         Author: Edwin Dada, MD 01/06/2022 2:51 PM  For on call review [email or web address].

## 2022-01-06 NOTE — Anesthesia Procedure Notes (Signed)
Procedure Name: LMA Insertion Date/Time: 01/06/2022 11:15 AM  Performed by: Genelle Bal, CRNAPre-anesthesia Checklist: Patient identified, Emergency Drugs available, Suction available and Patient being monitored Patient Re-evaluated:Patient Re-evaluated prior to induction Oxygen Delivery Method: Circle system utilized Preoxygenation: Pre-oxygenation with 100% oxygen Induction Type: IV induction Ventilation: Mask ventilation without difficulty LMA: LMA inserted LMA Size: 4.0 Number of attempts: 1 Airway Equipment and Method: Bite block Placement Confirmation: positive ETCO2 Tube secured with: Tape Dental Injury: Teeth and Oropharynx as per pre-operative assessment

## 2022-01-06 NOTE — Assessment & Plan Note (Addendum)
No evidence of withdrawals - Continue CIWA and on demand lorazepam - Continue thiamine and folate

## 2022-01-06 NOTE — Progress Notes (Signed)
Interventional Radiology Brief Note:  PA to bedside to discuss possible drain placement due to inguinal abscess.  After consult completed, was notified that patient is planning for OR evacuation of abscess.  Will cancel IR consult at this time.   Brynda Greathouse, MS RD PA-C

## 2022-01-06 NOTE — Assessment & Plan Note (Addendum)
3cm renal abscess persists on CT 10/4.  IR consulted for drainage and culture.  - Continue antibiotics - Perc drain ordered

## 2022-01-06 NOTE — Anesthesia Postprocedure Evaluation (Signed)
Anesthesia Post Note  Patient: Dominic Carter  Procedure(s) Performed: IRRIGATION AND DEBRIDEMENT ABSCESS (Right: Groin)     Patient location during evaluation: PACU Anesthesia Type: General Level of consciousness: awake and alert Pain management: pain level controlled Vital Signs Assessment: post-procedure vital signs reviewed and stable Respiratory status: spontaneous breathing, nonlabored ventilation and respiratory function stable Cardiovascular status: blood pressure returned to baseline Postop Assessment: no apparent nausea or vomiting Anesthetic complications: no   No notable events documented.  Last Vitals:  Vitals:   01/06/22 1245 01/06/22 1300  BP: 119/72 114/70  Pulse: 74 70  Resp: 15 12  Temp:    SpO2: 94% 96%    Last Pain:  Vitals:   01/06/22 1245  TempSrc:   PainSc: Asleep                 Marthenia Rolling

## 2022-01-06 NOTE — Progress Notes (Addendum)
Pharmacy Antibiotic Note  Dominic Carter is a 66 y.o. male admitted on 01/05/2022 with  right inguinal abscess related to mesh infection as well as pyelonephritis with renal abscess .  Pharmacy has been consulted for vancomycin dosing.  Plan: Vancomycin 1500mg  IV x1 then 1000mg  IV Q12H. Goal AUC 400-550.  Expected AUC 455. Change Zosyn to 3.375g IV Q8H (4-hour infusion).  Height: 5\' 11"  (180.3 cm) Weight: 78 kg (171 lb 15.3 oz) IBW/kg (Calculated) : 75.3  Temp (24hrs), Avg:98.8 F (37.1 C), Min:97.4 F (36.3 C), Max:100.5 F (38.1 C)  Recent Labs  Lab 01/05/22 1359 01/05/22 2110  WBC 7.8  --   CREATININE 0.87  --   LATICACIDVEN  --  1.6    Estimated Creatinine Clearance: 89 mL/min (by C-G formula based on SCr of 0.87 mg/dL).    Allergies  Allergen Reactions   Castor Oil Nausea And Vomiting   Other Nausea And Vomiting    Boiled Fat back meat    Thank you for allowing pharmacy to be a part of this patient's care.  Wynona Neat, PharmD, BCPS  01/06/2022 12:32 AM

## 2022-01-06 NOTE — Progress Notes (Signed)
Subjective/Chief Complaint: Right groin tender   Objective: Vital signs in last 24 hours: Temp:  [97.4 F (36.3 C)-100.5 F (38.1 C)] 98.4 F (36.9 C) (10/02 0747) Pulse Rate:  [84-123] 84 (10/02 0747) Resp:  [15-18] 17 (10/02 0747) BP: (112-144)/(56-83) 112/78 (10/02 0747) SpO2:  [98 %-100 %] 99 % (10/02 0747) Weight:  [78 kg] 78 kg (10/01 1846)    Intake/Output from previous day: 10/01 0701 - 10/02 0700 In: 1050 [IV Piggyback:1050] Out: 175 [Urine:175] Intake/Output this shift: No intake/output data recorded.  Right inguinal mass very tender to palpation, no real erythema  Lab Results:  Recent Labs    01/05/22 1359 01/06/22 0345  WBC 7.8 11.7*  HGB 14.6 11.3*  HCT 46.6 34.5*  PLT 255 240   BMET Recent Labs    01/05/22 1359 01/06/22 0345  NA 132* 134*  K 4.2 3.8  CL 102 103  CO2 20* 21*  GLUCOSE 95 103*  BUN 7* 7*  CREATININE 0.87 0.76  CALCIUM 8.6* 8.4*   PT/INR No results for input(s): "LABPROT", "INR" in the last 72 hours. ABG No results for input(s): "PHART", "HCO3" in the last 72 hours.  Invalid input(s): "PCO2", "PO2"  Studies/Results: CT ABDOMEN PELVIS W CONTRAST  Result Date: 01/05/2022 CLINICAL DATA:  Right groin pain, initial encounter EXAM: CT ABDOMEN AND PELVIS WITH CONTRAST TECHNIQUE: Multidetector CT imaging of the abdomen and pelvis was performed using the standard protocol following bolus administration of intravenous contrast. RADIATION DOSE REDUCTION: This exam was performed according to the departmental dose-optimization program which includes automated exposure control, adjustment of the mA and/or kV according to patient size and/or use of iterative reconstruction technique. CONTRAST:  33mL OMNIPAQUE IOHEXOL 350 MG/ML SOLN COMPARISON:  05/10/2009 FINDINGS: Lower chest: No acute abnormality. Hepatobiliary: Mild fatty infiltration of the liver is noted. Gallbladder is within normal limits. Pancreas: Unremarkable. No pancreatic  ductal dilatation or surrounding inflammatory changes. Spleen: Area of decreased enhancement along the inferior tip which does not persist on delayed images likely representing a small hemangioma. Adrenals/Urinary Tract: Adrenal glands are within normal limits. Kidneys demonstrate scattered areas of decreased enhancement in the left kidney which demonstrate a persistent decreased enhancement on delayed images. Some slight enhancement along the margin is noted laterally in the left kidney raising the suspicion of a developing renal abscess. This area of peripheral enhancement measures approximately 3.3 x 2.1 cm in greatest dimension and is best visualized on image number 6 of series 8. Normal excretion is noted bilaterally. The bladder is decompressed. Stomach/Bowel: No obstructive or inflammatory changes of the colon are seen. The colon is somewhat distended with gas. The appendix is well visualized and within normal limits. Small bowel and stomach are unremarkable. Vascular/Lymphatic: Aortic atherosclerosis. Persistent left common iliac artery occlusion is noted similar to that seen on prior exam from 2011. Reconstitution at the level of the proximal external iliac artery on the left is noted via pelvic collaterals in the left internal iliac artery. New aortoiliac stent graft is seen on the right which is widely patent. No significant adenopathy is noted. Reproductive: Prostate is unremarkable. Other: No free fluid is noted. No hernia is seen. In the area of prior hernia repair however there is a peripherally enhancing air-fluid collection within the abdominal cavity just beneath the rectus muscles which extends along the anterior aspect of the right iliacus muscle and also appears to extend through a defect in the anterior abdominal wall to the subcutaneous tissues. The component within the abdominal cavity  measures at least 8.3 x 2.1 cm and extends for at least 5.6 cm in craniocaudad projection. The component in  the subcutaneous region measures approximately 3.3 x 3.0 cm and extends for 5.8 cm in craniocaudad projection. There is a tract which appears to intercommunicate between the 2 collections best seen on image number 77 of series 3. Musculoskeletal: Degenerative changes of lumbar spine are noted. No acute bony abnormality is seen. IMPRESSION: Changes in the right lower quadrant consistent with a multifocal abscess which extends from within the abdominal cavity through the abdominal wall into the subcutaneous tissues and extends to the skin surface. This is felt to represent infection of previously placed mesh for hernia repair. It also extends along the anterior aspect of the right iliacus muscle but does not appear to involve the muscle. Additionally there are changes in the left kidney which are suspicious for pyelonephritis and possible developing renal abscess laterally. Correlate with laboratory values. 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 Electronically Signed   By: Inez Catalina M.D.   On: 01/05/2022 19:43    Anti-infectives: Anti-infectives (From admission, onward)    Start     Dose/Rate Route Frequency Ordered Stop   01/06/22 1200  vancomycin (VANCOCIN) IVPB 1000 mg/200 mL premix        1,000 mg 200 mL/hr over 60 Minutes Intravenous Every 12 hours 01/06/22 0032     01/06/22 0400  piperacillin-tazobactam (ZOSYN) IVPB 3.375 g        3.375 g 12.5 mL/hr over 240 Minutes Intravenous Every 8 hours 01/06/22 0028     01/06/22 0045  vancomycin (VANCOREADY) IVPB 1500 mg/300 mL        1,500 mg 150 mL/hr over 120 Minutes Intravenous  Once 01/06/22 0032 01/06/22 0400   01/06/22 0030  piperacillin-tazobactam (ZOSYN) IVPB 3.375 g  Status:  Discontinued        3.375 g 100 mL/hr over 30 Minutes Intravenous  Once 01/06/22 0022 01/06/22 0028   01/05/22 2030  piperacillin-tazobactam (ZOSYN) IVPB 3.375 g  Status:  Discontinued         3.375 g 12.5 mL/hr over 240 Minutes Intravenous Every 6 hours 01/05/22 2022 01/06/22 0022       Assessment/Plan: Right groin abscess -wbc up today, this is quite tender, I think best course would be to drain operatively. Discussed drainage with open wound afterwards plan to do today  I reviewed hospitalist notes, last 24 h vitals and pain scores, last 48 h intake and output, last 24 h labs and trends, and last 24 h imaging results.  This care required moderate level of medical decision making.   Rolm Bookbinder 01/06/2022

## 2022-01-06 NOTE — TOC CAGE-AID Note (Signed)
Transition of Care [facility]) - CAGE-AID Screening   Patient Details  Name: Dominic Carter MRN: [id number] Date of Birth: 1956-03-25  [facility]) CM/SW Contact:    Marilu Favre, RN Phone Number: 01/06/2022, 2:22 PM   Clinical Narrative:  Patient states he quit drinking ETOH a week ago. Patient did take resources   CAGE-AID Screening:    Have You Ever Felt You Ought to Cut Down on Your Drinking or Drug Use?: Yes (states he quit a week ago)          Substance Abuse Education Offered: Yes  Substance abuse interventions: Patient Counseling

## 2022-01-06 NOTE — Transfer of Care (Signed)
Immediate Anesthesia Transfer of Care Note  Patient: Estill Bakes  Procedure(s) Performed: IRRIGATION AND DEBRIDEMENT ABSCESS (Right: Groin)  Patient Location: PACU  Anesthesia Type:General  Level of Consciousness: awake, alert  and oriented  Airway & Oxygen Therapy: Patient Spontanous Breathing and Patient connected to face mask oxygen  Post-op Assessment: Report given to RN and Post -op Vital signs reviewed and stable  Post vital signs: Reviewed and stable  Last Vitals:  Vitals Value Taken Time  BP 104/71 01/06/22 1145  Temp 36.5 C 01/06/22 1145  Pulse 72 01/06/22 1145  Resp 9 01/06/22 1145  SpO2 100 % 01/06/22 1145  Vitals shown include unvalidated device data.  Last Pain:  Vitals:   01/06/22 1031  TempSrc:   PainSc: 0-No pain         Complications: No notable events documented.

## 2022-01-06 NOTE — Assessment & Plan Note (Addendum)
Continue pravastatin 

## 2022-01-06 NOTE — Op Note (Signed)
Preoperative diagnosis: Right inguinal abscess Postoperative diagnosis: Same as above Procedure: Right inguinal abscess incision and drainage Surgeon: Dr. Serita Grammes Anesthesia: General Estimated blood loss: Minimal Specimens: Cultures to microbiology Complications: None Drains: None Sponge and count was correct completion Disposition recovery stable condition  Indications: This is a 66 year old male who comes in with right groin pain.  He had a previous hernia repair in around 2018 at [facility].  He had a CT scan shows a large right inguinal abscess with a component that appears to be associated with the mesh as well as some subcutaneous extension.  He also potentially has a left renal abscess.  I saw him this morning and we elected to go to the operating room and drain this as it is quite tender and appeared to have an infection.  Procedure: After informed consent was obtained he was taken to the operating.  He was already on antibiotics.  SCDs were in place.  He was placed under general anesthesia without complication.  He was prepped and draped in the standard sterile surgical fashion.  A surgical timeout was then performed.  I filtrated Marcaine in his right groin.  Overlying the abscess I then made an incision.  Immediately purulence and some necrotic tissue came out.  I cultured this and sent this to microbiology.  I then opened the cavity completely.  I did not see the mesh or make an effort to go into the deeper cavity.  I irrigated this copiously until there was no more purulence coming out and then I packed it with iodoform gauze.  A dressing was placed.  He tolerated this well and was extubated in the operating room.  We will likely see how he does and then potentially repeat his CT scan to see if there is still a deeper component or if it is all decompressed.

## 2022-01-06 NOTE — Anesthesia Preprocedure Evaluation (Addendum)
Anesthesia Evaluation  Patient identified by MRN, date of birth, ID band Patient awake    Reviewed: Allergy & Precautions, NPO status , Patient's Chart, lab work & pertinent test results  History of Anesthesia Complications Negative for: history of anesthetic complications  Airway Mallampati: I  TM Distance: >3 FB Neck ROM: Full    Dental  (+) Edentulous Lower, Edentulous Upper   Pulmonary Current Smoker and Patient abstained from smoking.,    Pulmonary exam normal        Cardiovascular hypertension, Pt. on medications + DVT  Normal cardiovascular exam     Neuro/Psych Seizures -,  CVA negative psych ROS   GI/Hepatic negative GI ROS, (+)     substance abuse  alcohol use,   Endo/Other  negative endocrine ROS  Renal/GU negative Renal ROS  negative genitourinary   Musculoskeletal negative musculoskeletal ROS (+)   Abdominal   Peds  Hematology  (+) Blood dyscrasia (Hgb 11.3), anemia ,   Anesthesia Other Findings right inguinal abscess   Reproductive/Obstetrics negative OB ROS                            Anesthesia Physical Anesthesia Plan  ASA: 3  Anesthesia Plan: General   Post-op Pain Management: Tylenol PO (pre-op)*   Induction: Intravenous  PONV Risk Score and Plan: 1 and Treatment may vary due to age or medical condition, Dexamethasone, Ondansetron and Midazolam  Airway Management Planned: LMA  Additional Equipment: None  Intra-op Plan:   Post-operative Plan: Extubation in OR  Informed Consent: I have reviewed the patients History and Physical, chart, labs and discussed the procedure including the risks, benefits and alternatives for the proposed anesthesia with the patient or authorized representative who has indicated his/her understanding and acceptance.     Dental advisory given  Plan Discussed with: CRNA  Anesthesia Plan Comments:        Anesthesia Quick  Evaluation

## 2022-01-06 NOTE — Assessment & Plan Note (Addendum)
BP normal - Hold amlodipine for now

## 2022-01-06 NOTE — Assessment & Plan Note (Addendum)
S/p Right inguinal abscess incision and drainage 10/2 by Dr. Donne Hazel Mesh debrided NOT removed. Cultures not yet mature. - Continue Vancomycin Zosyn - Follow cultures -Patient will follow-up with [place] [facility] for wound check after discharge, then with Dr. Raul Del

## 2022-01-06 NOTE — Hospital Course (Addendum)
Dominic Carter is a 66 y.o. M with hx alcohol use disorder, seizure disorder, history of CVA, HTN, HLD, s/p right BKA, hx of bilateral inguinal hernia repair with mesh in [place] by Dr. Demetrius Revel 05/2014 who is admitted with right inguinal abscess related to mesh as well as left pyelonephritis with possible developing renal abscess.   10/2: Admitted, Gen Surg consulted, took to OR for I&D 10/3: improving

## 2022-01-06 NOTE — Assessment & Plan Note (Addendum)
-   Hold aspirin - Continue pravastatin

## 2022-01-06 NOTE — TOC Initial Note (Addendum)
Transition of Care [facility]) - Initial/Assessment Note    Patient Details  Name: Dominic Carter MRN: [id number] Date of Birth: 14-Nov-1955  [facility]) CM/SW Contact:    Marilu Favre, RN Phone Number: 01/06/2022, 2:25 PM  Clinical Narrative:                 Spoke to patient at bedside.   Patient from home with niece.   Per chart patient ran out of medications and has not been taking them. Patient states he just got prescriptions refilled and started taking them again.   PCP is at [facility]   He has transportation to appointments.   Consult for ETOH , patient states he quit a week ago, however he did take resources.     Expected Discharge Plan: Home/Self Care Barriers to Discharge: Continued Medical Work up   Patient Goals and CMS Choice Patient states their goals for this hospitalization and ongoing recovery are:: to return to home      Expected Discharge Plan and Services Expected Discharge Plan: Home/Self Care   Discharge Planning Services: CM Consult   Living arrangements for the past 2 months: Single Family Home                 DME Arranged: N/A DME Agency: NA       HH Arranged: NA          Prior Living Arrangements/Services Living arrangements for the past 2 months: Single Family Home Lives with:: Relatives (niece) Patient language and need for interpreter reviewed:: Yes        Need for Family Participation in Patient Care: Yes (Comment) Care giver support system in place?: Yes (comment)   Criminal Activity/Legal Involvement Pertinent to Current Situation/Hospitalization: No - Comment as needed  Activities of Daily Living      Permission Sought/Granted   Permission granted to share information with : No              Emotional Assessment Appearance:: Appears stated age Attitude/Demeanor/Rapport: Engaged Affect (typically observed): Accepting Orientation: : Oriented to Self, Oriented to Place,  Oriented to  Time, Oriented to Situation Alcohol / Substance Use: Alcohol Use (states he quit a week ago) Psych Involvement: No (comment)  Admission diagnosis:  Inguinal abscess [L02.214] Patient Active Problem List   Diagnosis Date Noted   Right inguinal abscess 01/06/2022   Pyelonephritis of left kidney 01/06/2022   Transaminitis 09/01/2021   Tobacco abuse 09/01/2021   History of CVA (cerebrovascular accident) 09/01/2021   Scalp wound 09/01/2021   Alcohol withdrawal syndrome with perceptual disturbance ([place])    Dyslipidemia 01/30/2020   Hepatitis, alcoholic, acute 40/98/1191   Seizure disorder ([place]) 01/20/2020   ETOH abuse 01/20/2020   HTN (hypertension) 01/20/2020   Chronic ischemic right MCA stroke 01/20/2020   PCP:  Simona Huh, NP Pharmacy:   [facility], [place] [address] [place] [address] Phone: [phone] Fax: [phone]     Social Determinants of Health (SDOH) Interventions Food Insecurity Interventions: Intervention Not Indicated Housing Interventions: Intervention Not Indicated Transportation Interventions: Intervention Not Indicated Utilities Interventions: Intervention Not Indicated  Readmission Risk Interventions     No data to display

## 2022-01-07 ENCOUNTER — Encounter (HOSPITAL_COMMUNITY): Payer: Self-pay | Admitting: General Surgery

## 2022-01-07 DIAGNOSIS — Z8673 Personal history of transient ischemic attack (TIA), and cerebral infarction without residual deficits: Secondary | ICD-10-CM

## 2022-01-07 DIAGNOSIS — E785 Hyperlipidemia, unspecified: Secondary | ICD-10-CM

## 2022-01-07 DIAGNOSIS — I10 Essential (primary) hypertension: Secondary | ICD-10-CM

## 2022-01-07 DIAGNOSIS — F1010 Alcohol abuse, uncomplicated: Secondary | ICD-10-CM

## 2022-01-07 LAB — CBC
HCT: 33.8 % — ABNORMAL LOW (ref 39.0–52.0)
Hemoglobin: 11.1 g/dL — ABNORMAL LOW (ref 13.0–17.0)
MCH: 28.5 pg (ref 26.0–34.0)
MCHC: 32.8 g/dL (ref 30.0–36.0)
MCV: 86.7 fL (ref 80.0–100.0)
Platelets: 234 10*3/uL (ref 150–400)
RBC: 3.9 MIL/uL — ABNORMAL LOW (ref 4.22–5.81)
RDW: 14.6 % (ref 11.5–15.5)
WBC: 10.4 10*3/uL (ref 4.0–10.5)
nRBC: 0 % (ref 0.0–0.2)

## 2022-01-07 LAB — BASIC METABOLIC PANEL
Anion gap: 9 (ref 5–15)
BUN: 10 mg/dL (ref 8–23)
CO2: 23 mmol/L (ref 22–32)
Calcium: 8.5 mg/dL — ABNORMAL LOW (ref 8.9–10.3)
Chloride: 104 mmol/L (ref 98–111)
Creatinine, Ser: 0.8 mg/dL (ref 0.61–1.24)
GFR, Estimated: >60 mL/min (ref >60)
Glucose, Bld: 106 mg/dL — ABNORMAL HIGH (ref 70–99)
Potassium: 3.7 mmol/L (ref 3.5–5.1)
Sodium: 136 mmol/L (ref 135–145)

## 2022-01-07 MED ORDER — ENOXAPARIN SODIUM 40 MG/0.4ML IJ SOSY
40.0000 mg | PREFILLED_SYRINGE | Freq: Every day | INTRAMUSCULAR | Status: DC
  Administered 2022-01-07 – 2022-01-08 (×2): 40 mg via SUBCUTANEOUS
  Filled 2022-01-07 (×3): qty 0.4

## 2022-01-07 NOTE — Progress Notes (Signed)
1 Day Post-Op  Subjective: CC: Pain improved after I&D. No abdominal pain. Tolerating diet without n/v. Has not gotten oob.   Objective: Vital signs in last 24 hours: Temp:  [97.5 F (36.4 C)-98.2 F (36.8 C)] 97.7 F (36.5 C) (10/03 0807) Pulse Rate:  [64-80] 71 (10/03 0807) Resp:  [10-18] 16 (10/03 0807) BP: (84-122)/(53-84) 114/84 (10/03 0807) SpO2:  [94 %-100 %] 98 % (10/03 0807) Weight:  [74.8 kg] 74.8 kg (10/02 1017) Last BM Date : 01/05/22  Intake/Output from previous day: 10/02 0701 - 10/03 0700 In: 529.1 [I.V.:523.8; IV Piggyback:5.3] Out: 2 [Blood:2] Intake/Output this shift: No intake/output data recorded.  PE: Gen:  Alert, NAD, pleasant Card:  Reg Pulm:  Rate and effort normal Abd: Soft, ND, NT. R inguinal I&D site with packing in place - packing removed. No periwound erythema, induration or heat appreciated. No fluctuance.  Ext: R BKA  Lab Results:  Recent Labs    01/06/22 0345 01/07/22 0628  WBC 11.7* 10.4  HGB 11.3* 11.1*  HCT 34.5* 33.8*  PLT 240 234   BMET Recent Labs    01/06/22 0345 01/07/22 0628  NA 134* 136  K 3.8 3.7  CL 103 104  CO2 21* 23  GLUCOSE 103* 106*  BUN 7* 10  CREATININE 0.76 0.80  CALCIUM 8.4* 8.5*   PT/INR No results for input(s): "LABPROT", "INR" in the last 72 hours. CMP     Component Value Date/Time   NA 136 01/07/2022 0628   NA 141 02/03/2020 0000   NA 142 07/29/2011 1632   K 3.7 01/07/2022 0628   K 3.7 07/29/2011 1632   CL 104 01/07/2022 0628   CL 106 07/29/2011 1632   CO2 23 01/07/2022 0628   CO2 27 07/29/2011 1632   GLUCOSE 106 (H) 01/07/2022 0628   GLUCOSE 108 (H) 07/29/2011 1632   BUN 10 01/07/2022 0628   BUN 8 02/03/2020 0000   BUN 5 (L) 07/29/2011 1632   CREATININE 0.80 01/07/2022 0628   CREATININE 0.94 07/29/2011 1632   CALCIUM 8.5 (L) 01/07/2022 0628   CALCIUM 8.0 (L) 07/29/2011 1632   PROT 6.9 01/06/2022 0345   PROT 8.0 07/29/2011 1632   ALBUMIN 1.9 (L) 01/06/2022 0345   ALBUMIN  3.6 07/29/2011 1632   AST 32 01/06/2022 0345   AST 50 (H) 07/29/2011 1632   ALT 24 01/06/2022 0345   ALT 47 07/29/2011 1632   ALKPHOS 55 01/06/2022 0345   ALKPHOS 102 07/29/2011 1632   BILITOT 0.6 01/06/2022 0345   BILITOT 0.1 (L) 07/29/2011 1632   GFRNONAA >60 01/07/2022 0628   GFRNONAA >60 07/29/2011 1632   GFRAA 90 02/03/2020 0000   GFRAA >60 07/29/2011 1632   Lipase     Component Value Date/Time   LIPASE 28 01/05/2022 1359    Studies/Results: CT ABDOMEN PELVIS W CONTRAST  Result Date: 01/05/2022 CLINICAL DATA:  Right groin pain, initial encounter EXAM: CT ABDOMEN AND PELVIS WITH CONTRAST TECHNIQUE: Multidetector CT imaging of the abdomen and pelvis was performed using the standard protocol following bolus administration of intravenous contrast. RADIATION DOSE REDUCTION: This exam was performed according to the departmental dose-optimization program which includes automated exposure control, adjustment of the mA and/or kV according to patient size and/or use of iterative reconstruction technique. CONTRAST:  81mL OMNIPAQUE IOHEXOL 350 MG/ML SOLN COMPARISON:  05/10/2009 FINDINGS: Lower chest: No acute abnormality. Hepatobiliary: Mild fatty infiltration of the liver is noted. Gallbladder is within normal limits. Pancreas: Unremarkable. No pancreatic ductal dilatation  or surrounding inflammatory changes. Spleen: Area of decreased enhancement along the inferior tip which does not persist on delayed images likely representing a small hemangioma. Adrenals/Urinary Tract: Adrenal glands are within normal limits. Kidneys demonstrate scattered areas of decreased enhancement in the left kidney which demonstrate a persistent decreased enhancement on delayed images. Some slight enhancement along the margin is noted laterally in the left kidney raising the suspicion of a developing renal abscess. This area of peripheral enhancement measures approximately 3.3 x 2.1 cm in greatest dimension and is best  visualized on image number 6 of series 8. Normal excretion is noted bilaterally. The bladder is decompressed. Stomach/Bowel: No obstructive or inflammatory changes of the colon are seen. The colon is somewhat distended with gas. The appendix is well visualized and within normal limits. Small bowel and stomach are unremarkable. Vascular/Lymphatic: Aortic atherosclerosis. Persistent left common iliac artery occlusion is noted similar to that seen on prior exam from 2011. Reconstitution at the level of the proximal external iliac artery on the left is noted via pelvic collaterals in the left internal iliac artery. New aortoiliac stent graft is seen on the right which is widely patent. No significant adenopathy is noted. Reproductive: Prostate is unremarkable. Other: No free fluid is noted. No hernia is seen. In the area of prior hernia repair however there is a peripherally enhancing air-fluid collection within the abdominal cavity just beneath the rectus muscles which extends along the anterior aspect of the right iliacus muscle and also appears to extend through a defect in the anterior abdominal wall to the subcutaneous tissues. The component within the abdominal cavity measures at least 8.3 x 2.1 cm and extends for at least 5.6 cm in craniocaudad projection. The component in the subcutaneous region measures approximately 3.3 x 3.0 cm and extends for 5.8 cm in craniocaudad projection. There is a tract which appears to intercommunicate between the 2 collections best seen on image number 77 of series 3. Musculoskeletal: Degenerative changes of lumbar spine are noted. No acute bony abnormality is seen. IMPRESSION: Changes in the right lower quadrant consistent with a multifocal abscess which extends from within the abdominal cavity through the abdominal wall into the subcutaneous tissues and extends to the skin surface. This is felt to represent infection of previously placed mesh for hernia repair. It also extends  along the anterior aspect of the right iliacus muscle but does not appear to involve the muscle. Additionally there are changes in the left kidney which are suspicious for pyelonephritis and possible developing renal abscess laterally. Correlate with laboratory values. 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 Electronically Signed   By: Alcide Clever M.D.   On: 01/05/2022 19:43    Anti-infectives: Anti-infectives (From admission, onward)    Start     Dose/Rate Route Frequency Ordered Stop   01/06/22 1200  vancomycin (VANCOCIN) IVPB 1000 mg/200 mL premix        1,000 mg 200 mL/hr over 60 Minutes Intravenous Every 12 hours 01/06/22 0032     01/06/22 0400  piperacillin-tazobactam (ZOSYN) IVPB 3.375 g        3.375 g 12.5 mL/hr over 240 Minutes Intravenous Every 8 hours 01/06/22 0028     01/06/22 0045  vancomycin (VANCOREADY) IVPB 1500 mg/300 mL        1,500 mg 150 mL/hr over 120 Minutes Intravenous  Once 01/06/22 0032 01/06/22 0400   01/06/22 0030  piperacillin-tazobactam (ZOSYN) IVPB 3.375 g  Status:  Discontinued        3.375 g 100 mL/hr over 30 Minutes Intravenous  Once 01/06/22 0022 01/06/22 0028   01/05/22 2030  piperacillin-tazobactam (ZOSYN) IVPB 3.375 g  Status:  Discontinued        3.375 g 12.5 mL/hr over 240 Minutes Intravenous Every 6 hours 01/05/22 2022 01/06/22 0022        Assessment/Plan POD 1 s/p Right inguinal abscess incision and drainage by Dr. Donne Hazel on 01/06/22 - Packing removed, does not need to be replaced - Cont abx. Cx pending - We will follow   FEN - Reg, IVF per TRH VTE - SCDs, okay for chem ppx from our standpoint ID - [place], Vanc    LOS: 1 day    Jillyn Ledger , [facility] 01/07/2022, 8:07 AM Please see Amion for pager number during day hours 7:00am-4:30pm

## 2022-01-07 NOTE — Progress Notes (Signed)
  Progress Note   Patient: Dominic Carter ZOX:[id number] DOB: Oct 19, 1955 DOA: 01/05/2022     1 DOS: the patient was seen and examined on 01/07/2022 at 8:51AM      Brief hospital course: Mr. Gaster is a 66 y.o. M with hx alcohol use disorder, seizure disorder, history of CVA, HTN, HLD, s/p right BKA, hx of bilateral inguinal hernia repair with mesh in [place] by Dr. Demetrius Revel 05/2014 who is admitted with right inguinal abscess related to mesh as well as left pyelonephritis with possible developing renal abscess.   10/2: Admitted, Gen Surg consulted, took to OR for I&D 10/3: improving     Assessment and Plan: * Right inguinal abscess S/p Right inguinal abscess incision and drainage 10/2 by Dr. Donne Hazel - Continue Zosyn  Pyelonephritis of left kidney Discussed with urology yesterday.  They recommend interval imaging, and possible percutaneous drain Blood and urine cultures negative, intraoperative culture yesterday with strep anginosus and gram-negative rods - Continue vancomycin and Zosyn for now - Repeat CT abdomen and pelvis tomorrow - Follow urine culture   ETOH abuse No evidence of withdrawals - Continue CIWA and on demand lorazepam - Continue thiamine and folate  Seizure disorder (HCC) - Continue Keppra - Refills at d/c  HTN (hypertension) BP normal - Hold amlodipine for now  History of CVA (cerebrovascular accident) - Hold aspirin - Continue pravastatin   Tobacco abuse Smoking cessation recommended   Dyslipidemia - Continue pravastatin          Subjective: Appetite good, no confusion, fever, vomiting, diarrhea.      Physical Exam: BP 114/84 (BP Location: Right Arm)   Pulse 71   Temp 97.7 F (36.5 C) (Oral)   Resp 16   Ht 5\' 11"  (1.803 m)   Wt 74.8 kg   SpO2 98%   BMI 23.01 kg/m   Thin adult male, lying in bed, eating breakfast RRR, no murmurs, no peripheral edema Respiratory rate normal, lungs clear without rales or  wheezes Tenderness around his incision site, no rigidity or guarding in the rest of the abdomen Attention normal, affect appropriate, judgment insight appear normal    Data Reviewed: Patient metabolic panel unremarkable Complete blood count shows mild insignificant anemia  Family Communication: None present    Disposition: Status is: Inpatient Patient was admitted with a abscess around his inguinal hernia mesh, and possible abscess of the kidney  He has intraoperative cultures which will likely not be mature until Thursday, and will need repeat imaging tomorrow if his kidney.  With possible PERC drain  Maybe home Thursday from medical standpoint        Author: Edwin Dada, MD 01/07/2022 1:28 PM  For on call review [email or web address].

## 2022-01-07 NOTE — Progress Notes (Signed)
Mobility Specialist - Progress Note   01/07/22 1228  Mobility  Activity Ambulated with assistance in hallway  Activity Response Tolerated well  Distance Ambulated (ft) 300 ft  $Mobility charge 1 Mobility  Level of Assistance Contact guard assist, steadying assist  Assistive Device Front wheel walker    Pt received in bed agreeable to mobility. Took standing rest break x3 d/t arm fatigue. Left in bed w/ call bell in reach and all needs met.   Paulla Dolly Mobility Specialist

## 2022-01-08 ENCOUNTER — Other Ambulatory Visit (HOSPITAL_COMMUNITY): Payer: Self-pay

## 2022-01-08 ENCOUNTER — Inpatient Hospital Stay (HOSPITAL_COMMUNITY): Payer: Medicare Other

## 2022-01-08 DIAGNOSIS — G40909 Epilepsy, unspecified, not intractable, without status epilepticus: Secondary | ICD-10-CM

## 2022-01-08 DIAGNOSIS — N12 Tubulo-interstitial nephritis, not specified as acute or chronic: Secondary | ICD-10-CM

## 2022-01-08 LAB — COMPREHENSIVE METABOLIC PANEL
ALT: 22 U/L (ref 0–44)
AST: 28 U/L (ref 15–41)
Albumin: 1.8 g/dL — ABNORMAL LOW (ref 3.5–5.0)
Alkaline Phosphatase: 48 U/L (ref 38–126)
Anion gap: 7 (ref 5–15)
BUN: 8 mg/dL (ref 8–23)
CO2: 23 mmol/L (ref 22–32)
Calcium: 8 mg/dL — ABNORMAL LOW (ref 8.9–10.3)
Chloride: 105 mmol/L (ref 98–111)
Creatinine, Ser: 0.89 mg/dL (ref 0.61–1.24)
GFR, Estimated: >60 mL/min (ref >60)
Glucose, Bld: 85 mg/dL (ref 70–99)
Potassium: 3.5 mmol/L (ref 3.5–5.1)
Sodium: 135 mmol/L (ref 135–145)
Total Bilirubin: 0.3 mg/dL (ref 0.3–1.2)
Total Protein: 6.4 g/dL — ABNORMAL LOW (ref 6.5–8.1)

## 2022-01-08 LAB — CBC
HCT: 33.7 % — ABNORMAL LOW (ref 39.0–52.0)
Hemoglobin: 11.4 g/dL — ABNORMAL LOW (ref 13.0–17.0)
MCH: 28.9 pg (ref 26.0–34.0)
MCHC: 33.8 g/dL (ref 30.0–36.0)
MCV: 85.3 fL (ref 80.0–100.0)
Platelets: 263 10*3/uL (ref 150–400)
RBC: 3.95 MIL/uL — ABNORMAL LOW (ref 4.22–5.81)
RDW: 14.6 % (ref 11.5–15.5)
WBC: 7.5 10*3/uL (ref 4.0–10.5)
nRBC: 0 % (ref 0.0–0.2)

## 2022-01-08 MED ORDER — IOHEXOL 350 MG/ML SOLN: 100.0000 mL | Freq: Once | INTRAVENOUS | Status: DC | PRN

## 2022-01-08 MED ORDER — IOHEXOL 350 MG/ML SOLN
75.0000 mL | Freq: Once | INTRAVENOUS | Status: AC | PRN
  Administered 2022-01-08: 75 mL via INTRAVENOUS

## 2022-01-08 MED ORDER — OXYCODONE HCL 5 MG PO TABS
5.0000 mg | ORAL_TABLET | Freq: Four times a day (QID) | ORAL | 0 refills | Status: DC | PRN
  Filled 2022-01-08: qty 15, 4d supply, fill #0

## 2022-01-08 NOTE — Progress Notes (Signed)
2 Days Post-Op  Subjective: CC: Feeling better. Much less pain. Tolerating w/ po pain medications. Tolerating diet without abdominal pain, n/v. Voiding. BM yesterday. Mobilized with moblity tech.   Looks like Psychologist, sport and exercise that did his [place] repair is Dr. Demetrius Revel. Plans to follow up with them long term for this but will see [place] in the office x 1 for wound check.   Objective: Vital signs in last 24 hours: Temp:  [97.7 F (36.5 C)-98.7 F (37.1 C)] 97.8 F (36.6 C) (10/04 0432) Pulse Rate:  [71-84] 82 (10/04 0432) Resp:  [16-17] 17 (10/03 1603) BP: (106-130)/(71-85) 130/85 (10/04 0432) SpO2:  [97 %-99 %] 98 % (10/04 0432) Last BM Date : 01/06/22  Intake/Output from previous day: 10/03 0701 - 10/04 0700 In: 720 [P.O.:720] Out: 575 [Urine:575] Intake/Output this shift: No intake/output data recorded.  PE: Gen:  Alert, NAD, pleasant Card:  Reg Pulm:  Rate and effort normal Abd: Soft, ND, NT. R inguinal I&D site clean without significant drainage. Dressing was with a small amount of purulent drainage. Small amount of surrounding induration. No periwound erythema or heat appreciated. No fluctuance.  Ext: R BKA  Lab Results:  Recent Labs    01/07/22 0628 01/08/22 0310  WBC 10.4 7.5  HGB 11.1* 11.4*  HCT 33.8* 33.7*  PLT 234 263    BMET Recent Labs    01/07/22 0628 01/08/22 0310  NA 136 135  K 3.7 3.5  CL 104 105  CO2 23 23  GLUCOSE 106* 85  BUN 10 8  CREATININE 0.80 0.89  CALCIUM 8.5* 8.0*    PT/INR No results for input(s): "LABPROT", "INR" in the last 72 hours. CMP     Component Value Date/Time   NA 135 01/08/2022 0310   NA 141 02/03/2020 0000   NA 142 07/29/2011 1632   K 3.5 01/08/2022 0310   K 3.7 07/29/2011 1632   CL 105 01/08/2022 0310   CL 106 07/29/2011 1632   CO2 23 01/08/2022 0310   CO2 27 07/29/2011 1632   GLUCOSE 85 01/08/2022 0310   GLUCOSE 108 (H) 07/29/2011 1632   BUN 8 01/08/2022 0310   BUN 8 02/03/2020 0000   BUN 5 (L)  07/29/2011 1632   CREATININE 0.89 01/08/2022 0310   CREATININE 0.94 07/29/2011 1632   CALCIUM 8.0 (L) 01/08/2022 0310   CALCIUM 8.0 (L) 07/29/2011 1632   PROT 6.4 (L) 01/08/2022 0310   PROT 8.0 07/29/2011 1632   ALBUMIN 1.8 (L) 01/08/2022 0310   ALBUMIN 3.6 07/29/2011 1632   AST 28 01/08/2022 0310   AST 50 (H) 07/29/2011 1632   ALT 22 01/08/2022 0310   ALT 47 07/29/2011 1632   ALKPHOS 48 01/08/2022 0310   ALKPHOS 102 07/29/2011 1632   BILITOT 0.3 01/08/2022 0310   BILITOT 0.1 (L) 07/29/2011 1632   GFRNONAA >60 01/08/2022 0310   GFRNONAA >60 07/29/2011 1632   GFRAA 90 02/03/2020 0000   GFRAA >60 07/29/2011 1632   Lipase     Component Value Date/Time   LIPASE 28 01/05/2022 1359    Studies/Results: No results found.  Anti-infectives: Anti-infectives (From admission, onward)    Start     Dose/Rate Route Frequency Ordered Stop   01/06/22 1200  vancomycin (VANCOCIN) IVPB 1000 mg/200 mL premix        1,000 mg 200 mL/hr over 60 Minutes Intravenous Every 12 hours 01/06/22 0032     01/06/22 0400  piperacillin-tazobactam (ZOSYN) IVPB 3.375 g  3.375 g 12.5 mL/hr over 240 Minutes Intravenous Every 8 hours 01/06/22 0028     01/06/22 0045  vancomycin (VANCOREADY) IVPB 1500 mg/300 mL        1,500 mg 150 mL/hr over 120 Minutes Intravenous  Once 01/06/22 0032 01/06/22 0400   01/06/22 0030  piperacillin-tazobactam (ZOSYN) IVPB 3.375 g  Status:  Discontinued        3.375 g 100 mL/hr over 30 Minutes Intravenous  Once 01/06/22 0022 01/06/22 0028   01/05/22 2030  piperacillin-tazobactam (ZOSYN) IVPB 3.375 g  Status:  Discontinued        3.375 g 12.5 mL/hr over 240 Minutes Intravenous Every 6 hours 01/05/22 2022 01/06/22 0022        Assessment/Plan POD 2 s/p Right inguinal abscess incision and drainage by Dr. Donne Hazel on 01/06/22 - Packing removed, does not need to be replaced. Cover with dressing dressing. Change daily or more often for soiling. Can shower daily, letting soapy  water run over the wound, pat dry.  - Afebrile. WBC wnl. Cont abx. Cx with strep anginosis and gram neg rods. Sensitives pending. Discussed with pharm, recommend switching to Augmentin when transition to po. Would recommend 7-10d total abx at dc - Okay for d/c from our standpoint. Will arrange follow up in our office for wound check. Patient can f/u with his operating surgeon for prior [facility] repair with mesh (2016 - Dr. Demetrius Revel) for long term care/follow up of this after. Will send pain meds to the pharmacy. Discussed with primary team about this. They are okay with [place] signing off at this time. They are planning to keep him hear for further workup of L pyelo that they discussed with urology about yesterday.   FEN - Reg, IVF per TRH VTE - SCDs, Lovenox ID - Zosyn, Vanc    LOS: 2 days    Jillyn Ledger , [facility] 01/08/2022, 7:25 AM Please see Amion for pager number during day hours 7:00am-4:30pm

## 2022-01-08 NOTE — Progress Notes (Signed)
First bottle of oral contrast provided to pt at this time. Instruction given  on NPO status during and after oral contrast. Pt verbalizes understanding.

## 2022-01-08 NOTE — Progress Notes (Signed)
Second bottle of contrast given to pt at this time. Pt remains agreeable to plan.

## 2022-01-08 NOTE — Progress Notes (Signed)
Mobility Specialist - Progress Note   01/08/22 1535  Mobility  Activity Ambulated with assistance in hallway  Activity Response Tolerated well  Distance Ambulated (ft) 300 ft  $Mobility charge 1 Mobility  Level of Assistance Contact guard assist, steadying assist  Assistive Device Front wheel walker    Pt received in bed and agreeable. Left in bed w/ call bell in reach and all needs met.   Paulla Dolly Mobility Specialist

## 2022-01-08 NOTE — Consult Note (Signed)
Chief Complaint: Patient was seen in consultation today for left renal abscess  Referring Physician(s): Myrene Buddy, MD  Supervising Physician: Sandi Mariscal  Patient Status: [facility] - In-pt  History of Present Illness: Dominic Carter is a 66 y.o. male with PMH of alcohol use disorder, hypertension, seizure disorder, and s/p right below-knee amputation being seen today in consultation for left pyelonephritis with associated left renal abscess. The patient was originally admitted for treatment of a right inguinal abscess related to mesh implant from previous bilateral inguinal hernia repair in 2016. The patient had right inguinal abscess incision and drainage performed on 10/2 by Dr Donne Hazel. CT Abdomen & Pelvis performed today revealed the presence of a 3.2 x 1.6 cm fluid collection of the left kidney consistent with abscess. IR was consulted at this time to evaluate the patient for image-guided aspiration with possible drain placement.   Past Medical History:  Diagnosis Date   DVT (deep venous thrombosis) (HCC)    Dyslipidemia    ETOH abuse    Hx of BKA, right ([name])    Hypertension    Seizure disorder ([place])     Past Surgical History:  Procedure Laterality Date   BELOW KNEE LEG AMPUTATION Right    IRRIGATION AND DEBRIDEMENT ABSCESS Right 01/06/2022   Procedure: IRRIGATION AND DEBRIDEMENT ABSCESS;  Surgeon: Rolm Bookbinder, MD;  Location: [place];  Service: General;  Laterality: Right;    Allergies: Castor oil and Other  Medications: Prior to Admission medications   Medication Sig Start Date End Date Taking? Authorizing Provider  amLODipine (NORVASC) 10 MG tablet Take 1 tablet (10 mg total) by mouth daily. Patient not taking: Reported on 01/06/2022 07/12/21   [name], MD  aspirin EC 81 MG EC tablet Take 1 tablet (81 mg total) by mouth daily. Swallow whole. Patient not taking: Reported on 08/28/2020 01/27/20   Jonetta Osgood, MD  folic acid (FOLVITE) 1 MG  tablet Take 1 tablet (1 mg total) by mouth daily. Patient not taking: Reported on 08/10/2021 09/06/20   Armando Reichert, MD  levETIRAcetam (KEPPRA) 500 MG tablet Take 1 tablet (500 mg total) by mouth 2 (two) times daily. Patient not taking: Reported on 08/10/2021 11/26/20   Quintella Reichert, MD  melatonin 5 MG TABS Take 1 tablet (5 mg total) by mouth at bedtime. Patient not taking: Reported on 01/06/2022 09/07/21   Jonetta Osgood, MD  Multiple Vitamin (MULTIVITAMIN WITH MINERALS) TABS tablet Take 1 tablet by mouth daily. Patient not taking: Reported on 08/10/2021 09/05/20   Armando Reichert, MD  nicotine (NICODERM CQ - DOSED IN MG/24 HOURS) 21 mg/24hr patch Place 1 patch (21 mg total) onto the skin daily. Patient not taking: Reported on 08/28/2020 02/09/20   Medina-Vargas, Monina C, NP  oxyCODONE (OXY IR/ROXICODONE) 5 MG immediate release tablet Take 1 tablet (5 mg total) by mouth every 6 (six) hours as needed for breakthrough pain. 01/08/22   Maczis, Barth Kirks, PA-C  pravastatin (PRAVACHOL) 20 MG tablet Take 1 tablet (20 mg total) by mouth daily. Patient not taking: Reported on 08/10/2021 09/06/20   Armando Reichert, MD  thiamine 100 MG tablet Take 1 tablet (100 mg total) by mouth daily. Patient not taking: Reported on 08/10/2021 09/06/20   Armando Reichert, MD     Family History  Problem Relation Age of Onset   Hypertension Mother    Hypertension Sister    Hypertension Brother    Hypertension Maternal Grandmother    Hypertension Maternal Grandfather  Social History   Socioeconomic History   Marital status: Single    Spouse name: Not on file   Number of children: Not on file   Years of education: Not on file   Highest education level: Not on file  Occupational History   Not on file  Tobacco Use   Smoking status: Every Day    Packs/day: 1.00    Types: Cigarettes   Smokeless tobacco: Never  Vaping Use   Vaping Use: Never used  Substance and Sexual Activity   Alcohol use: Yes    Comment: @ least 40 oz  beer/day   Drug use: No   Sexual activity: Not on file  Other Topics Concern   Not on file  Social History Narrative   Not on file   Social Determinants of Health   Financial Resource Strain: Not on file  Food Insecurity: No Food Insecurity (01/06/2022)   Hunger Vital Sign    Worried About Running Out of Food in the Last Year: Never true    Ran Out of Food in the Last Year: Never true  Transportation Needs: No Transportation Needs (01/06/2022)   PRAPARE - Hydrologist (Medical): No    Lack of Transportation (Non-Medical): No  Physical Activity: Not on file  Stress: Not on file  Social Connections: Not on file     Review of Systems: A 12 point ROS discussed and pertinent positives are indicated in the HPI above.  All other systems are negative.  Review of Systems  Constitutional:  Negative for chills and fever.  Respiratory:  Negative for chest tightness and shortness of breath.   Cardiovascular:  Negative for chest pain and leg swelling.  Gastrointestinal:  Positive for nausea and vomiting. Negative for diarrhea.  Neurological:  Negative for dizziness, light-headedness and headaches.  Psychiatric/Behavioral:  Negative for confusion.     Vital Signs: BP 125/79 (BP Location: Right Arm)   Pulse 77   Temp 97.8 F (36.6 C) (Oral)   Resp 16   Ht 5\' 11"  (1.803 m)   Wt 165 lb (74.8 kg)   SpO2 97%   BMI 23.01 kg/m   Physical Exam Vitals reviewed.  Constitutional:      General: He is not in acute distress.    Appearance: He is not ill-appearing.  HENT:     Mouth/Throat:     Mouth: Mucous membranes are moist.  Cardiovascular:     Rate and Rhythm: Normal rate and regular rhythm.     Pulses: Normal pulses.     Heart sounds: Normal heart sounds.  Pulmonary:     Effort: Pulmonary effort is normal.     Breath sounds: Normal breath sounds.  Abdominal:     General: Abdomen is flat. Bowel sounds are normal. There is no distension.      Palpations: Abdomen is soft.     Tenderness: There is no abdominal tenderness.  Musculoskeletal:     Left lower leg: No edema.     Comments: Patient with right sided below knee amputation  Skin:    General: Skin is warm and dry.  Neurological:     Mental Status: He is alert and oriented to person, place, and time.  Psychiatric:        Mood and Affect: Mood normal.        Behavior: Behavior normal.        Thought Content: Thought content normal.        Judgment: Judgment normal.  Imaging: CT ABDOMEN PELVIS W CONTRAST  Result Date: 01/08/2022 CLINICAL DATA:  Follow-up pyelonephritis with possible renal abscess, and right lower quadrant abscess. EXAM: CT ABDOMEN AND PELVIS WITH CONTRAST TECHNIQUE: Multidetector CT imaging of the abdomen and pelvis was performed using the standard protocol following bolus administration of intravenous contrast. RADIATION DOSE REDUCTION: This exam was performed according to the departmental dose-optimization program which includes automated exposure control, adjustment of the mA and/or kV according to patient size and/or use of iterative reconstruction technique. CONTRAST:  50mL OMNIPAQUE IOHEXOL 350 MG/ML SOLN COMPARISON:  01/05/2022 FINDINGS: Lower Chest: No acute findings. Hepatobiliary: No hepatic masses identified. Gallbladder is unremarkable. No evidence of biliary ductal dilatation. Pancreas:  No mass or inflammatory changes. Spleen: Within normal limits in size and appearance. Adrenals/Urinary Tract: Ill-defined areas of decreased parenchymal enhancement are again seen in the left kidney consistent with pyelonephritis. A focal area of fluid attenuation and mild peripheral rim enhancement is seen in the lateral midpole of the left kidney currently measuring 3.2 x 1.6 cm, compared to 3.4 x 2.1 cm previously. This is consistent with small renal abscess which has mildly decreased in size. No evidence of ureteral calculi or hydronephrosis. Stable mild diffuse  bladder wall thickening. Stomach/Bowel: No evidence of obstruction, inflammatory process or abnormal fluid collections. Vascular/Lymphatic: No pathologically enlarged lymph nodes. No acute vascular findings. Aortic atherosclerotic calcification incidentally noted. Right common iliac artery stent remains in place. 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 Reproductive:  No mass or other significant abnormality. Other: Fluid and gas collection along the posterior margin of the rectus sheath in the right pelvis is again seen which shows a fistulous tract extending to the skin surface in the right inguinal region. This shows mild decrease in size, currently measuring 6.4 x 1.6 cm on image 72/3, compared to 8.3 x 2.5 cm previously. Musculoskeletal:  No suspicious bone lesions identified. IMPRESSION: Persistent left pyelonephritis, with mild interval decrease in size of 3.2 cm left renal abscess. No evidence of ureteral calculi or hydronephrosis. Mild decrease in size of abscess along the posterior margin of the rectus sheath in the right pelvis, with fistulous tract extending to the skin surface in the right inguinal region. Stable diffuse bladder wall thickening, which may be due to cystitis or chronic bladder outlet obstruction. Aortic Atherosclerosis (ICD10-I70.0). Electronically Signed   By: Marlaine Hind M.D.   On: 01/08/2022 13:01   CT ABDOMEN PELVIS W CONTRAST  Result Date: 01/05/2022 CLINICAL DATA:  Right groin pain, initial encounter EXAM: CT ABDOMEN AND PELVIS WITH CONTRAST TECHNIQUE: Multidetector CT imaging of the abdomen and pelvis was performed using the standard protocol following bolus administration of intravenous contrast. RADIATION DOSE REDUCTION: This exam was performed according to the departmental dose-optimization program which includes automated exposure control, adjustment of the mA and/or kV  according to patient size and/or use of iterative reconstruction technique. CONTRAST:  34mL OMNIPAQUE IOHEXOL 350 MG/ML SOLN COMPARISON:  05/10/2009 FINDINGS: Lower chest: No acute abnormality. Hepatobiliary: Mild fatty infiltration of the liver is noted. Gallbladder is within normal limits. Pancreas: Unremarkable. No pancreatic ductal dilatation or surrounding inflammatory changes. Spleen: Area of decreased enhancement along the inferior tip which does not persist on delayed images likely representing a small hemangioma. Adrenals/Urinary Tract: Adrenal glands are within normal limits. Kidneys demonstrate scattered areas of decreased enhancement in the left kidney which demonstrate a persistent decreased enhancement on delayed images. Some slight enhancement  along the margin is noted laterally in the left kidney raising the suspicion of a developing renal abscess. This area of peripheral enhancement measures approximately 3.3 x 2.1 cm in greatest dimension and is best visualized on image number 6 of series 8. Normal excretion is noted bilaterally. The bladder is decompressed. Stomach/Bowel: No obstructive or inflammatory changes of the colon are seen. The colon is somewhat distended with gas. The appendix is well visualized and within normal limits. Small bowel and stomach are unremarkable. Vascular/Lymphatic: Aortic atherosclerosis. Persistent left common iliac artery occlusion is noted similar to that seen on prior exam from 2011. Reconstitution at the level of the proximal external iliac artery on the left is noted via pelvic collaterals in the left internal iliac artery. New aortoiliac stent graft is seen on the right which is widely patent. No significant adenopathy is noted. Reproductive: Prostate is unremarkable. Other: No free fluid is noted. No hernia is seen. In the area of prior hernia repair however there is a peripherally enhancing air-fluid collection within the abdominal cavity just beneath the rectus  muscles which extends along the anterior aspect of the right iliacus muscle and also appears to extend through a defect in the anterior abdominal wall to the subcutaneous tissues. The component within the abdominal cavity measures at least 8.3 x 2.1 cm and extends for at least 5.6 cm in craniocaudad projection. The component in the subcutaneous region measures approximately 3.3 x 3.0 cm and extends for 5.8 cm in craniocaudad projection. There is a tract which appears to intercommunicate between the 2 collections best seen on image number 77 of series 3. Musculoskeletal: Degenerative changes of lumbar spine are noted. No acute bony abnormality is seen. IMPRESSION: Changes in the right lower quadrant consistent with a multifocal abscess which extends from within the abdominal cavity through the abdominal wall into the subcutaneous tissues and extends to the skin surface. This is felt to represent infection of previously placed mesh for hernia repair. It also extends along the anterior aspect of the right iliacus muscle but does not appear to involve the muscle. Additionally there are changes in the left kidney which are suspicious for pyelonephritis and possible developing renal abscess laterally. Correlate with laboratory values. 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 Electronically Signed   By: Inez Catalina M.D.   On: 01/05/2022 19:43    Labs:  CBC: Recent Labs    01/05/22 1359 01/06/22 0345 01/07/22 0628 01/08/22 0310  WBC 7.8 11.7* 10.4 7.5  HGB 14.6 11.3* 11.1* 11.4*  HCT 46.6 34.5* 33.8* 33.7*  PLT 255 240 234 263    COAGS: No results for input(s): "INR", "APTT" in the last 8760 hours.  BMP: Recent Labs    01/05/22 1359 01/06/22 0345 01/07/22 0628 01/08/22 0310  NA 132* 134* 136 135  K 4.2 3.8 3.7 3.5  CL 102 103 104 105  CO2 20* 21* 23 23  GLUCOSE 95 103* 106* 85  BUN 7* 7* 10 8  CALCIUM 8.6*  8.4* 8.5* 8.0*  CREATININE 0.87 0.76 0.80 0.89  GFRNONAA >60 >60 >60 >60    LIVER FUNCTION TESTS: Recent Labs    09/02/21 0107 01/05/22 1359 01/06/22 0345 01/08/22 0310  BILITOT 0.9 0.8 0.6 0.3  AST 56* 54* 32 28  ALT 29 31 24 22   ALKPHOS 66 76 55 48  PROT 7.1 8.9* 6.9 6.4*  ALBUMIN 3.4* 2.5* 1.9* 1.8*    TUMOR  MARKERS: No results for input(s): "AFPTM", "CEA", "CA199", "CHROMGRNA" in the last 8760 hours.  Assessment and Plan:  Dominic Carter is a 66 yo male with PMH of alcohol use disorder, hypertension, seizure disorder, and s/p right below-knee amputation being seen today in consultation for left pyelonephritis with associated left renal abscess. Case has been reviewed by Dr. Pascal Lux and approved for CT-guided left renal aspiration with possible drain placement for 01/09/22.  Risks and benefits of CT-guided left renal aspiration with possible drain placement were discussed with the patient including bleeding, infection, damage to adjacent structures, bowel perforation/fistula connection, and sepsis.  All of the patient's questions were answered, patient is agreeable to proceed. Consent signed and in IR suite.   Thank you for this interesting consult.  I greatly enjoyed meeting MALOSI NIEMCZYK and look forward to participating in their care.  A copy of this report was sent to the requesting provider on this date.  Electronically Signed: Lura Em, PA-C 01/08/2022, 3:13 PM   I spent a total of 40 Minutes    in face to face in clinical consultation, greater than 50% of which was counseling/coordinating care for left renal abscess.

## 2022-01-08 NOTE — Progress Notes (Signed)
  Progress Note   Patient: Dominic Carter LZJ:[id number] DOB: Mar 23, 1956 DOA: 01/05/2022     2 DOS: the patient was seen and examined on 01/08/2022       Brief hospital course: Dominic Carter is a 66 y.o. M with hx alcohol use disorder, seizure disorder, history of CVA, HTN, HLD, s/p right BKA, hx of bilateral inguinal hernia repair with mesh in [place] by Dr. Demetrius Revel 05/2014 who is admitted with right inguinal abscess related to mesh as well as left pyelonephritis with possible developing renal abscess.   10/2: Admitted, Gen Surg consulted, took to OR for I&D 10/3: improving     Assessment and Plan: * Right inguinal abscess S/p Right inguinal abscess incision and drainage 10/2 by Dr. Donne Hazel Mesh debrided NOT removed. Cultures not yet mature. - Continue Vancomycin Zosyn - Follow cultures -Patient will follow-up with [place] surgery for wound check after discharge, then with Dr. Raul Del   Pyelonephritis of left kidney Discussed with urology yesterday.  There was a possibility of abscess.  Urine culture negative. - Will obtain CT abdomen today to re-evaluate for abscess - If abscess, will need perc drain - If no abscess, will discharge on same abx as wound culture   ETOH abuse No evidence of withdrawals - Continue CIWA and on demand lorazepam - Continue thiamine and folate  Seizure disorder (HCC) - Continue Keppra - Refills at d/c  HTN (hypertension) BP normal - Hold amlodipine for now  History of CVA (cerebrovascular accident) - Hold aspirin - Continue pravastatin   Tobacco abuse Smoking cessation recommended   Dyslipidemia - Continue pravastatin          Subjective: Patient's appetite is good, no fever, no confusion, no chest discomfort, no respiratory distress, left flank without pain.  Wound area without drainage or cellulitis.     Physical Exam: BP 130/85 (BP Location: Right Arm)   Pulse 82   Temp 97.8 F (36.6 C) (Oral)    Resp 17   Ht 5\' 11"  (1.803 m)   Wt 74.8 kg   SpO2 98%   BMI 23.01 kg/m   Thin adult male, lying in bed, right BKA, interactive, trying to watch television RRR, no murmurs, no peripheral edema Respiratory rate normal, lungs clear without rales or wheezes Abdomen soft without tenderness to palpation or guarding, no ascites or distention Attention normal, affect normal, judgment and insight appear somewhat impaired but at baseline, face symmetric, speech fluent Partially edentulous, right BKA, diffuse loss of subcutaneous muscle mass and fat    Data Reviewed: Discussed with general surgery Culture data still pending CBC shows mild anemia, clinically insignificant Comprehensive metabolic panel unremarkable    Family Communication: We will speak with case later today    Disposition: Status is: Inpatient The patient was admitted with infection of the left kidney and the right inguinal mesh.  Mesh has NOT been removed but was debrided  When culturs mature tomorrow and CT confirms no abscess, will d/c  If CT shows kidneuy abscess, will discuss perc drain with IR        Author: Edwin Dada, MD 01/08/2022 8:10 AM  For on call review [email or web address].

## 2022-01-09 LAB — CBC WITH DIFFERENTIAL/PLATELET
Abs Immature Granulocytes: 0.08 10*3/uL — ABNORMAL HIGH (ref 0.00–0.07)
Basophils Absolute: 0.1 10*3/uL (ref 0.0–0.1)
Basophils Relative: 1 %
Eosinophils Absolute: 0.2 10*3/uL (ref 0.0–0.5)
Eosinophils Relative: 2 %
HCT: 33.6 % — ABNORMAL LOW (ref 39.0–52.0)
Hemoglobin: 11 g/dL — ABNORMAL LOW (ref 13.0–17.0)
Immature Granulocytes: 1 %
Lymphocytes Relative: 23 %
Lymphs Abs: 1.6 10*3/uL (ref 0.7–4.0)
MCH: 28.4 pg (ref 26.0–34.0)
MCHC: 32.7 g/dL (ref 30.0–36.0)
MCV: 86.6 fL (ref 80.0–100.0)
Monocytes Absolute: 0.9 10*3/uL (ref 0.1–1.0)
Monocytes Relative: 13 %
Neutro Abs: 4.2 10*3/uL (ref 1.7–7.7)
Neutrophils Relative %: 60 %
Platelets: 260 10*3/uL (ref 150–400)
RBC: 3.88 MIL/uL — ABNORMAL LOW (ref 4.22–5.81)
RDW: 14.6 % (ref 11.5–15.5)
WBC: 7 10*3/uL (ref 4.0–10.5)
nRBC: 0 % (ref 0.0–0.2)

## 2022-01-09 LAB — COMPREHENSIVE METABOLIC PANEL
ALT: 28 U/L (ref 0–44)
AST: 37 U/L (ref 15–41)
Albumin: 1.9 g/dL — ABNORMAL LOW (ref 3.5–5.0)
Alkaline Phosphatase: 53 U/L (ref 38–126)
Anion gap: 8 (ref 5–15)
BUN: 10 mg/dL (ref 8–23)
CO2: 26 mmol/L (ref 22–32)
Calcium: 8.5 mg/dL — ABNORMAL LOW (ref 8.9–10.3)
Chloride: 102 mmol/L (ref 98–111)
Creatinine, Ser: 0.85 mg/dL (ref 0.61–1.24)
GFR, Estimated: >60 mL/min (ref >60)
Glucose, Bld: 93 mg/dL (ref 70–99)
Potassium: 4 mmol/L (ref 3.5–5.1)
Sodium: 136 mmol/L (ref 135–145)
Total Bilirubin: 0.3 mg/dL (ref 0.3–1.2)
Total Protein: 6.6 g/dL (ref 6.5–8.1)

## 2022-01-09 LAB — PROTIME-INR
INR: 1.2 (ref 0.8–1.2)
Prothrombin Time: 15.1 seconds (ref 11.4–15.2)

## 2022-01-09 NOTE — Evaluation (Signed)
Physical Therapy Evaluation & Discharge  Patient Details Name: Dominic Carter MRN: [id number] DOB: Apr 06, 1956 Today's Date: 01/09/2022  History of Present Illness  66 y/o male presented to ED on 01/05/22 for R groin pain. Found to have R inguinal abscess related to mesh and L pyelonephritis with possible developing renal abscess. S/p R inguinal abscess I&D on 10/2. PMH includes right BKA, HTN, dyslipidemia, DVT. daily ETOH use  Clinical Impression  Patient admitted with the above. PTA, patient modI for mobility and ADLs and lives with niece. Patient currently functioning at baseline for mobility with use of RW. Patient does not have prosthesis but uses w/c as primary mobility at home. No further skilled PT needs identified acutely. No PT follow up recommended at this time. PT will sign off.        Recommendations for follow up therapy are one component of a multi-disciplinary discharge planning process, led by the attending physician.  Recommendations may be updated based on patient status, additional functional criteria and insurance authorization.  Follow Up Recommendations No PT follow up      Assistance Recommended at Discharge PRN  Patient can return home with the following       Equipment Recommendations None recommended by PT  Recommendations for Other Services       Functional Status Assessment Patient has not had a recent decline in their functional status     Precautions / Restrictions Precautions Precautions: Fall Precaution Comments: R BKA with no prosthesis Restrictions Weight Bearing Restrictions: No      Mobility  Bed Mobility Overal bed mobility: Modified Independent                  Transfers Overall transfer level: Modified independent Equipment used: Rolling Stellan Vick (2 wheels)                    Ambulation/Gait Ambulation/Gait assistance: Modified independent (Device/Increase time) Gait Distance (Feet): 250 Feet (+250) Assistive  device: Rolling Romaine Neville (2 wheels) Gait Pattern/deviations:  (hop to) Gait velocity: decreased     General Gait Details: seated rest break halfway then able to finish ambulation with x3 standing rest breaks due to UE fatigue  Stairs            Wheelchair Mobility    Modified Rankin (Stroke Patients Only)       Balance Overall balance assessment: Mild deficits observed, not formally tested                                           Pertinent Vitals/Pain Pain Assessment Pain Assessment: No/denies pain    Home Living Family/patient expects to be discharged to:: Private residence Living Arrangements: Other relatives Available Help at Discharge: Available PRN/intermittently Type of Home: House Home Access: Stairs to enter Entrance Stairs-Rails: Right;Left;Can reach both Entrance Stairs-Number of Steps: 3   Home Layout: One level Home Equipment: Agricultural consultant (2 wheels);Wheelchair - manual;Shower seat      Prior Function Prior Level of Function : Independent/Modified Independent             Mobility Comments: reports he uses w/c as primary means of mobility ADLs Comments: reports independence     Hand Dominance        Extremity/Trunk Assessment   Upper Extremity Assessment Upper Extremity Assessment: Overall WFL for tasks assessed    Lower Extremity Assessment Lower Extremity Assessment: Overall  WFL for tasks assessed    Cervical / Trunk Assessment Cervical / Trunk Assessment: Normal  Communication   Communication: Expressive difficulties  Cognition Arousal/Alertness: Awake/alert Behavior During Therapy: WFL for tasks assessed/performed Overall Cognitive Status: Within Functional Limits for tasks assessed                                          General Comments      Exercises     Assessment/Plan    PT Assessment Patient does not need any further PT services  PT Problem List         PT Treatment  Interventions      PT Goals (Current goals can be found in the Care Plan section)  Acute Rehab PT Goals Patient Stated Goal: to go home PT Goal Formulation: All assessment and education complete, DC therapy    Frequency       Co-evaluation               AM-PAC PT "6 Clicks" Mobility  Outcome Measure Help needed turning from your back to your side while in a flat bed without using bedrails?: None Help needed moving from lying on your back to sitting on the side of a flat bed without using bedrails?: None Help needed moving to and from a bed to a chair (including a wheelchair)?: None Help needed standing up from a chair using your arms (e.g., wheelchair or bedside chair)?: None Help needed to walk in hospital room?: None Help needed climbing 3-5 steps with a railing? : None 6 Click Score: 24    End of Session Equipment Utilized During Treatment: Gait belt Activity Tolerance: Patient tolerated treatment well Patient left: in bed;with call bell/phone within reach Nurse Communication: Mobility status PT Visit Diagnosis: Muscle weakness (generalized) (M62.81)    Time: 4166-0630 PT Time Calculation (min) (ACUTE ONLY): 29 min   Charges:   PT Evaluation $PT Eval Low Complexity: 1 Low PT Treatments $Therapeutic Activity: 8-22 mins        Vale Mousseau A. Gilford Rile PT, DPT Acute Rehabilitation Services Office [phone]   Linna Hoff 01/09/2022, 1:37 PM

## 2022-01-09 NOTE — Progress Notes (Signed)
Mobility Specialist - Progress Note   01/09/22 1500  Mobility  Activity Ambulated with assistance in hallway  Activity Response Tolerated well  Distance Ambulated (ft) 300 ft  $Mobility charge 1 Mobility  Level of Assistance Standby assist, set-up cues, supervision of patient - no hands on  Assistive Device Front wheel walker    Pt received in bed agreeable to mobility. Left EOB w/ call bell in reach and all needs met.   Paulla Dolly Mobility Specialist

## 2022-01-09 NOTE — Progress Notes (Deleted)
Interventional Radiology Brief Note:  IR planning for renal collection aspiration today, however patient's lovenox was not held.  Will make NPO p MN, hold lovenox for re-attempt in IR tomorrow.   Brynda Greathouse, MS RD PA-C

## 2022-01-09 NOTE — Progress Notes (Signed)
  Progress Note   Patient: Dominic Carter GGE:[id number] DOB: [birth year] DOA: 01/05/2022     3 DOS: the patient was seen and examined on 01/09/2022 at 8:50AM      Brief hospital course: Dominic Carter is a 66 y.o. M with hx alcohol use disorder, seizure disorder, history of CVA, HTN, HLD, s/p right BKA, hx of bilateral inguinal hernia repair with mesh in [place] by Dominic Carter 05/2014 who is admitted with right inguinal abscess related to mesh as well as left pyelonephritis with possible developing renal abscess.   10/2: Admitted, Gen Surg consulted, took to OR for I&D 10/3: improving 10/4: CT shows persistent abscess 10/5: Drain delayed, plan for tomorrow    Assessment and Plan: * Right inguinal abscess S/p Right inguinal abscess incision and drainage 10/2 by Dominic Carter Mesh debrided NOT removed. Cultures not yet mature but only Ecoli and strep anginosus for now - Continue Zosyn - Follow cultures - Patient will follow-up with [place] [facility] for wound check after discharge, then with Dominic Carter   Renal abscess, left 3cm renal abscess persists on CT 10/4.  IR consulted for drainage and culture.  - Continue antibiotics - Perc drain ordered  ETOH abuse No evidence of withdrawals - Continue CIWA and on demand lorazepam - Continue thiamine and folate  Seizure disorder (HCC) - Continue Keppra - Refills at d/c  HTN (hypertension) BP normal - Hold amlodipine for now  History of CVA (cerebrovascular accident) - Hold aspirin - Continue pravastatin   Tobacco abuse Smoking cessation recommended   Dyslipidemia - Continue pravastatin          Subjective: No fever, vomiting, diarrhea.  No significant left flank pain.  No change in the drainage from his right groin.     Physical Exam: BP 119/89 (BP Location: Right Arm)   Pulse 94   Temp 98.3 F (36.8 C) (Oral)   Resp 18   Ht 5\' 11"  (1.803 m)   Wt 74.8 kg   SpO2 98%   BMI 23.01 kg/m    Thin adult male, right BKA, no acute distress, interactive RRR, no murmurs, no peripheral edema Respiratory rate normal, lungs clear without rales or wheezes Abdomen soft without tenderness palpation or guarding, no CVA tenderness Attention normal, affect normal, judgment Syprine normal    Data Reviewed: INR normal, hemoglobin 11, comprehensive metabolic panel normal  Family Communication:     Disposition: Status is: Inpatient         Author: Edwin Dada, MD 01/09/2022 2:58 PM  For on call review [email or web address].

## 2022-01-09 NOTE — Progress Notes (Signed)
Transport at pt bedside for IR. Notified by IR RN that procedure post poned due to AM Lovenox not being held. I let RN know that the [facility] charting for admin of med was in error. Lovenox injection was not given this AM; charting corrected at this time. Awaiting update from IR.

## 2022-01-09 NOTE — Progress Notes (Signed)
Interventional Radiology Brief Note:   IR unable to accomodate today.  Plan for NPO p MN, hold lovenox and re-attempt for aspiration tomorrow.   Brynda Greathouse, MS RD PA-C

## 2022-01-09 NOTE — Care Management Important Message (Signed)
Important Message  Patient Details  Name: Dominic Carter MRN: [id number] Date of Birth: 1955-09-16   Medicare Important Message Given:  Yes     Hannah Beat 01/09/2022, 2:37 PM

## 2022-01-10 ENCOUNTER — Inpatient Hospital Stay (HOSPITAL_COMMUNITY): Payer: Medicare Other

## 2022-01-10 ENCOUNTER — Other Ambulatory Visit (HOSPITAL_COMMUNITY): Payer: Self-pay

## 2022-01-10 LAB — CULTURE, BLOOD (ROUTINE X 2)
Culture: NO GROWTH
Culture: NO GROWTH

## 2022-01-10 MED ORDER — GELATIN ABSORBABLE 12-7 MM EX MISC
CUTANEOUS | Status: AC
  Filled 2022-01-10: qty 1

## 2022-01-10 MED ORDER — ONDANSETRON HCL 4 MG PO TABS
4.0000 mg | ORAL_TABLET | Freq: Four times a day (QID) | ORAL | 0 refills | Status: DC | PRN
  Filled 2022-01-10: qty 20, 5d supply, fill #0

## 2022-01-10 MED ORDER — FENTANYL CITRATE (PF) 100 MCG/2ML IJ SOLN
INTRAMUSCULAR | Status: AC | PRN
  Administered 2022-01-10 (×2): 25 ug via INTRAVENOUS
  Administered 2022-01-10: 50 ug via INTRAVENOUS

## 2022-01-10 MED ORDER — METRONIDAZOLE 500 MG PO TABS
500.0000 mg | ORAL_TABLET | Freq: Three times a day (TID) | ORAL | 0 refills | Status: AC
  Filled 2022-01-10: qty 42, 14d supply, fill #0

## 2022-01-10 MED ORDER — THIAMINE HCL 100 MG PO TABS
100.0000 mg | ORAL_TABLET | Freq: Every day | ORAL | 3 refills | Status: DC
  Filled 2022-01-10: qty 30, 30d supply, fill #0

## 2022-01-10 MED ORDER — PRAVASTATIN SODIUM 20 MG PO TABS
20.0000 mg | ORAL_TABLET | Freq: Every day | ORAL | 3 refills | Status: AC
  Filled 2022-01-10: qty 30, 30d supply, fill #0

## 2022-01-10 MED ORDER — MIDAZOLAM HCL 2 MG/2ML IJ SOLN
INTRAMUSCULAR | Status: AC
  Filled 2022-01-10: qty 4

## 2022-01-10 MED ORDER — CEPHALEXIN 500 MG PO CAPS
500.0000 mg | ORAL_CAPSULE | Freq: Three times a day (TID) | ORAL | 0 refills | Status: AC
  Filled 2022-01-10: qty 42, 14d supply, fill #0

## 2022-01-10 MED ORDER — FOLIC ACID 1 MG PO TABS
1.0000 mg | ORAL_TABLET | Freq: Every day | ORAL | 3 refills | Status: DC
  Filled 2022-01-10: qty 30, 30d supply, fill #0

## 2022-01-10 MED ORDER — LEVETIRACETAM 500 MG PO TABS
500.0000 mg | ORAL_TABLET | Freq: Two times a day (BID) | ORAL | 3 refills | Status: DC
  Filled 2022-01-10: qty 60, 30d supply, fill #0

## 2022-01-10 MED ORDER — FENTANYL CITRATE (PF) 100 MCG/2ML IJ SOLN
INTRAMUSCULAR | Status: AC
  Filled 2022-01-10: qty 4

## 2022-01-10 MED ORDER — MIDAZOLAM HCL 2 MG/2ML IJ SOLN
INTRAMUSCULAR | Status: AC | PRN
  Administered 2022-01-10: 1 mg via INTRAVENOUS
  Administered 2022-01-10 (×2): .5 mg via INTRAVENOUS

## 2022-01-10 NOTE — Procedures (Signed)
Interventional Radiology Procedure:   Indications: Left renal abscess  Procedure: CT guided left renal aspiration  Findings: Area of concern in left kidney is poorly visualized on non contrast CT and not visible with [place].  18 gauge needle directed into lateral aspect of left kidney at area of concern with CT guidance.  No purulent fluid aspirated.  Small amount of bloody aspirate was combined with saline.   Fluid was sent for culture.   Complications: No immediate complications noted.     EBL: Minimal  Plan: Bedrest 3 hours   Kaycie Pegues R. Anselm Pancoast, MD  Pager: [phone]

## 2022-01-10 NOTE — Progress Notes (Signed)
Pt back to unit from IR. He is awake and alert; stable at baseline. MD notified.

## 2022-01-10 NOTE — Progress Notes (Signed)
Cab voucher provided. Pt transported off unit via [place] with all belongings on self. He remains alert and stable at baseline.

## 2022-01-10 NOTE — Progress Notes (Signed)
Mobility Specialist - Progress Note   01/10/22 1200  Mobility  Activity Ambulated with assistance in hallway  Activity Response Tolerated well  Distance Ambulated (ft) 250 ft  $Mobility charge 1 Mobility  Level of Assistance Standby assist, set-up cues, supervision of patient - no hands on  Assistive Device Front wheel walker    Pt received in room agreeable to mobility. Left in BR w/ all needs met.   Paulla Dolly Mobility Specialist

## 2022-01-10 NOTE — TOC Progression Note (Signed)
Transition of Care [facility]) - Progression Note    Patient Details  Name: RAHEEL KUNKLE MRN: [id number] Date of Birth: 04/07/1956  Transition of Care [facility] CM/SW Contact  Jacalyn Lefevre Edson Snowball, RN Phone Number: 01/10/2022, 3:57 PM  Clinical Narrative:     Patient needing transportation home.    Patient states his niece is home but her care is broke down, but she can assist him inside from cab.   NCM called [name] [phone] , confirmed above and address. [name] will assist patient inside.   Cab voucher given to nurse   Expected Discharge Plan: Home/Self Care Barriers to Discharge: Continued Medical Work up  Expected Discharge Plan and Services Expected Discharge Plan: Home/Self Care   Discharge Planning Services: CM Consult   Living arrangements for the past 2 months: Single Family Home Expected Discharge Date: 01/10/22               DME Arranged: N/A DME Agency: NA       HH Arranged: NA           Social Determinants of Health (SDOH) Interventions Food Insecurity Interventions: Intervention Not Indicated Housing Interventions: Intervention Not Indicated Transportation Interventions: Intervention Not Indicated Utilities Interventions: Intervention Not Indicated  Readmission Risk Interventions     No data to display

## 2022-01-10 NOTE — Discharge Summary (Signed)
Physician Discharge Summary   Patient: Dominic Carter MRN: [id number] DOB: [birth year]  Admit date:     01/05/2022  Discharge date: 01/10/22  Discharge Physician: Edwin Dada   PCP: Simona Huh, NP     Recommendations at discharge:  Follow up with Dr. Raul Del General Surgery for infected hernia mesh within 2 weeks Dr. Raul Del: Please repeat imaging as indicated and extend antibiotics beyond 14 days if needed Follow up with PCP Armanda Heritage in 1-2 weeks Armanda Heritage: Please obtain CBC and BMP in 1 week Follow up with Dr. Valeta Harms Surgery for wound check on Oct 19 Dr. Raul Del and Armanda Heritage: Please follow up 10/6 renal aspirate culture     Discharge Diagnoses: Principal Problem:   Right inguinal abscess Active Problems:   Renal abscess, left   ETOH abuse   Seizure disorder ([place])   HTN (hypertension)   History of CVA (cerebrovascular accident)   Tobacco abuse   Dyslipidemia      Hospital Course: Dominic Carter is a 66 y.o. M with hx alcohol use disorder, seizure disorder, history of CVA, HTN, HLD, s/p right BKA, hx of bilateral inguinal hernia repair with mesh in [place] by Dr. Demetrius Revel 05/2014 who is admitted with right inguinal abscess related to mesh as well as left pyelonephritis with possible developing renal abscess.      * Right inguinal abscess Patient admitted and started on antibiotics.  Evaluated by Surgery and taken to the OR 10/2 by Dr. Donne Hazel for right inguinal abscess incision and drainage  Mesh debrided NOT removed.  Cultures grew E coli, B fragilis and strep anginosus.  Completed 1 week Zosyn in the hospital, discharged with 14 more days cephalexin and Flagyl.  Recommend follow up with General Surgery within 2 weeks and extension of antibiotics as appropriate.          Renal abscess, left Also noted to have pyelonephritis and 3cm renal abscess.  Urine culture negative.  I would speculate that this was  the same organisms causing the mesh infection.  IR aspirated the abscess under CT guidance on 10/6.  Small amount of bloody fluid only.   - Return precautions given - Repeat CBC in 1 week - Antibiotics as above        ETOH abuse No evidence of withdrawals.  Discharged on thiamine.   Seizure disorder ([place]) Keppra refilled at discharge.  HTN (hypertension) BP normal off meds.    History of CVA (cerebrovascular accident) Hold aspirin, follow up with PCP.              The [facility] Controlled Substances Registry was reviewed for this patient prior to discharge.  Consultants: General Surgery, Dr. Donne Hazel Interventional Radiology, Dr. Anselm Pancoast  Procedures performed:  - Right inguinal abscess incision and drainage  - Aspiration LEFT renal abscess   Disposition: Home   DISCHARGE MEDICATION: Allergies as of 01/10/2022       Reactions   Castor Oil Nausea And Vomiting   Other Nausea And Vomiting   Boiled Fat back meat        Medication List     STOP taking these medications    amLODipine 10 MG tablet Commonly known as: NORVASC   aspirin EC 81 MG tablet   melatonin 5 MG Tabs   nicotine 21 mg/24hr patch Commonly known as: NICODERM CQ - dosed in mg/24 hours       TAKE these medications    cephALEXin 500 MG capsule Commonly known  as: KEFLEX Take 1 capsule (500 mg total) by mouth 3 (three) times daily for 14 days.   folic acid 1 MG tablet Commonly known as: FOLVITE Take 1 tablet (1 mg total) by mouth daily.   levETIRAcetam 500 MG tablet Commonly known as: KEPPRA Take 1 tablet (500 mg total) by mouth 2 (two) times daily.   metroNIDAZOLE 500 MG tablet Commonly known as: Flagyl Take 1 tablet (500 mg total) by mouth 3 (three) times daily for 14 days.   multivitamin with minerals Tabs tablet Take 1 tablet by mouth daily.   ondansetron 4 MG tablet Commonly known as: ZOFRAN Take 1 tablet (4 mg total) by mouth every 6 (six) hours as needed for  nausea.   oxyCODONE 5 MG immediate release tablet Commonly known as: Oxy IR/ROXICODONE Take 1 tablet (5 mg total) by mouth every 6 (six) hours as needed for breakthrough pain.   pravastatin 20 MG tablet Commonly known as: PRAVACHOL Take 1 tablet (20 mg total) by mouth daily.   thiamine 100 MG tablet Commonly known as: VITAMIN B1 Take 1 tablet (100 mg total) by mouth daily. Start taking on: January 11, 2022               Discharge Care Instructions  (From admission, onward)           Start     Ordered   01/10/22 0000  Discharge wound care:       Comments: As directed by General Surgery   01/10/22 1215            Follow-up Information     Courtney Paris, NP Follow up.   Specialty: Nurse Practitioner Why: Our team scheduled you an appointment on January 14, 2022 at 1000 am If you don't need this appointment, call them to cancel Contact information: [address] [place] [address] [phone]         Lanice Schwab, MD. Schedule an appointment as soon as possible for a visit.   Specialty: Surgery Why: For follow up Contact information: [address] [place] [address] [phone]         [facility], [facility] Follow up on 01/23/2022.   Specialty: General Surgery Why: 01/23/22 at 3:45 pm.  Please bring a copy of your photo ID and insurance card. Please arrive 30 minutes prior to your appointment for paperwork. Please bring a copy of your photo ID and insurance card. Contact information: [address] [place] [address] [phone]         Filomena Jungling, NP. Schedule an appointment as soon as possible for a visit in 1 week(s).   Specialty: Nurse Practitioner Contact information: [address] [place] [address] [phone]                 Discharge Instructions     Discharge instructions   Complete by: As directed    **IMPORTANT DISCHARGE INSTRUCTIONS** You  were admitted for infection in your hernia mesh and kidney You were taken to the operating room and Dr. Dwain Sarna did surgery to clean the mesh off The mesh is still there, though, so you MUST follow up with Dr. Buzzy Han who placed the mesh as soon as you can.  Make sure you see him within 2 weeks See below for his contact information  Also, go see Dr. Doreen Salvage office on Oct 19 ([facility], also see below) This is for a wound check  We  also saw that you had an infection in your kidney.  This is likely connected to the mesh, although this is uncertain. Take the antibiotics cephalexin/Keflex and metronidazole/Flagyl for 2 weeks Take cephalexin 500 mg three times daily Take metronidazole 500 mg three times daily The metronidazole may make you nauseated, so take with food.  If you have nausea, you may use ondansetron/Zofran the nausea medicine  Restart your seizure medicine, levetiracetam Restart the thiamine and folate supplements for at least 3 months  Go see your primary care doctor in 1 week and ask them to check your blood level  Return to the hospital for fever, vomiting that won't stop, or pain in your left back (over your left kidney)   To summarize:  -> Take antibiotics Keflex and Flagyl at least 2 weeks  -> [facility]uzzy [facility]ppara[facility] within 2 weeks, and decide if antibiotics should continue longer or not and whether to do surgery to remove the mesh  -> Go see primary care Filomena JunglingJerry Edwards in 1-2 weeks, have him check labs  -> [facility]or his [facility] to have a wound check on Oct 19th   Discharge wound care:   Complete by: As directed    As directed by General Surgery   Increase activity slowly   Complete by: As directed        Discharge Exam: Filed Weights   01/05/22 1846 01/06/22 1017  Weight: 78 kg 74.8 kg    General: Pt is alert, awake, not in acute distress, left BKA Cardiovascular: RRR, nl S1-S2, no  murmurs appreciated.   No LE edema.   Respiratory: Normal respiratory rate and rhythm.  CTAB without rales or wheezes. Abdominal: Abdomen soft and non-tender.  No distension or HSM.   Neuro/Psych: Strength symmetric in upper and lower extremities.  Judgment and insight appear normal.   Condition at discharge: good  The results of significant diagnostics from this hospitalization (including imaging, microbiology, ancillary and laboratory) are listed below for reference.   Imaging Studies: CT ABDOMEN PELVIS W CONTRAST  Result Date: 01/08/2022 CLINICAL DATA:  Follow-up pyelonephritis with possible renal abscess, and right lower quadrant abscess. EXAM: CT ABDOMEN AND PELVIS WITH CONTRAST TECHNIQUE: Multidetector CT imaging of the abdomen and pelvis was performed using the standard protocol following bolus administration of intravenous contrast. RADIATION DOSE REDUCTION: This exam was performed according to the departmental dose-optimization program which includes automated exposure control, adjustment of the mA and/or kV according to patient size and/or use of iterative reconstruction technique. CONTRAST:  75mL OMNIPAQUE IOHEXOL 350 MG/ML SOLN COMPARISON:  01/05/2022 FINDINGS: Lower Chest: No acute findings. Hepatobiliary: No hepatic masses identified. Gallbladder is unremarkable. No evidence of biliary ductal dilatation. Pancreas:  No mass or inflammatory changes. Spleen: Within normal limits in size and appearance. Adrenals/Urinary Tract: Ill-defined areas of decreased parenchymal enhancement are again seen in the left kidney consistent with pyelonephritis. A focal area of fluid attenuation and mild peripheral rim enhancement is seen in the lateral midpole of the left kidney currently measuring 3.2 x 1.6 cm, compared to 3.4 x 2.1 cm previously. This is consistent with small renal abscess which has mildly decreased in size. No evidence of ureteral calculi or hydronephrosis. Stable mild diffuse bladder wall  thickening. Stomach/Bowel: No evidence of obstruction, inflammatory process or abnormal fluid collections. Vascular/Lymphatic: No pathologically enlarged lymph nodes. No acute vascular findings. Aortic atherosclerotic calcification incidentally noted. Right common iliac artery stent remains in place. 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 Reproductive:  No mass or other significant abnormality. Other: Fluid and gas collection along the posterior margin of the rectus sheath in the right pelvis is again seen which shows a fistulous tract extending to the skin surface in the right inguinal region. This shows mild decrease in size, currently measuring 6.4 x 1.6 cm on image 72/3, compared to 8.3 x 2.5 cm previously. Musculoskeletal:  No suspicious bone lesions identified. IMPRESSION: Persistent left pyelonephritis, with mild interval decrease in size of 3.2 cm left renal abscess. No evidence of ureteral calculi or hydronephrosis. Mild decrease in size of abscess along the posterior margin of the rectus sheath in the right pelvis, with fistulous tract extending to the skin surface in the right inguinal region. Stable diffuse bladder wall thickening, which may be due to cystitis or chronic bladder outlet obstruction. Aortic Atherosclerosis (ICD10-I70.0). Electronically Signed   By: Danae Orleans M.D.   On: 01/08/2022 13:01   CT ABDOMEN PELVIS W CONTRAST  Result Date: 01/05/2022 CLINICAL DATA:  Right groin pain, initial encounter EXAM: CT ABDOMEN AND PELVIS WITH CONTRAST TECHNIQUE: Multidetector CT imaging of the abdomen and pelvis was performed using the standard protocol following bolus administration of intravenous contrast. RADIATION DOSE REDUCTION: This exam was performed according to the departmental dose-optimization program which includes automated exposure control, adjustment of the mA and/or kV according to  patient size and/or use of iterative reconstruction technique. CONTRAST:  97mL OMNIPAQUE IOHEXOL 350 MG/ML SOLN COMPARISON:  05/10/2009 FINDINGS: Lower chest: No acute abnormality. Hepatobiliary: Mild fatty infiltration of the liver is noted. Gallbladder is within normal limits. Pancreas: Unremarkable. No pancreatic ductal dilatation or surrounding inflammatory changes. Spleen: Area of decreased enhancement along the inferior tip which does not persist on delayed images likely representing a small hemangioma. Adrenals/Urinary Tract: Adrenal glands are within normal limits. Kidneys demonstrate scattered areas of decreased enhancement in the left kidney which demonstrate a persistent decreased enhancement on delayed images. Some slight enhancement along the margin is noted laterally in the left kidney raising the suspicion of a developing renal abscess. This area of peripheral enhancement measures approximately 3.3 x 2.1 cm in greatest dimension and is best visualized on image number 6 of series 8. Normal excretion is noted bilaterally. The bladder is decompressed. Stomach/Bowel: No obstructive or inflammatory changes of the colon are seen. The colon is somewhat distended with gas. The appendix is well visualized and within normal limits. Small bowel and stomach are unremarkable. Vascular/Lymphatic: Aortic atherosclerosis. Persistent left common iliac artery occlusion is noted similar to that seen on prior exam from 2011. Reconstitution at the level of the proximal external iliac artery on the left is noted via pelvic collaterals in the left internal iliac artery. New aortoiliac stent graft is seen on the right which is widely patent. No significant adenopathy is noted. Reproductive: Prostate is unremarkable. Other: No free fluid is noted. No hernia is seen. In the area of prior hernia repair however there is a peripherally enhancing air-fluid collection within the abdominal cavity just beneath the rectus muscles  which extends along the anterior aspect of the right iliacus muscle and also appears to extend through a defect in the anterior abdominal wall to the subcutaneous tissues. The component within the abdominal cavity measures at least 8.3 x 2.1 cm and extends for at least 5.6 cm in craniocaudad projection. The component in the subcutaneous region measures approximately 3.3  x 3.0 cm and extends for 5.8 cm in craniocaudad projection. There is a tract which appears to intercommunicate between the 2 collections best seen on image number 77 of series 3. Musculoskeletal: Degenerative changes of lumbar spine are noted. No acute bony abnormality is seen. IMPRESSION: Changes in the right lower quadrant consistent with a multifocal abscess which extends from within the abdominal cavity through the abdominal wall into the subcutaneous tissues and extends to the skin surface. This is felt to represent infection of previously placed mesh for hernia repair. It also extends along the anterior aspect of the right iliacus muscle but does not appear to involve the muscle. Additionally there are changes in the left kidney which are suspicious for pyelonephritis and possible developing renal abscess laterally. Correlate with laboratory values. 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 Electronically Signed   By: Alcide Clever M.D.   On: 01/05/2022 19:43    Microbiology: Results for orders placed or performed during the hospital encounter of 01/05/22  Blood culture (routine x 2)     Status: None   Collection Time: 01/05/22  9:09 PM   Specimen: BLOOD  Result Value Ref Range Status   Specimen Description BLOOD RIGHT ANTECUBITAL  Final   Special Requests   Final    BOTTLES DRAWN AEROBIC AND ANAEROBIC Blood Culture results may not be optimal due to an inadequate volume of blood received in culture bottles   Culture   Final    NO GROWTH 5  DAYS Performed at [facility], [address], [place], [place] [address]    Report Status 01/10/2022 FINAL  Final  Blood culture (routine x 2)     Status: None   Collection Time: 01/05/22  9:39 PM   Specimen: BLOOD RIGHT HAND  Result Value Ref Range Status   Specimen Description BLOOD RIGHT HAND  Final   Special Requests   Final    BOTTLES DRAWN AEROBIC AND ANAEROBIC Blood Culture results may not be optimal due to an inadequate volume of blood received in culture bottles   Culture   Final    NO GROWTH 5 DAYS Performed at [facility], [address], [place], [place] [address]    Report Status 01/10/2022 FINAL  Final  Urine Culture     Status: None   Collection Time: 01/05/22 10:54 PM   Specimen: Urine, Clean Catch  Result Value Ref Range Status   Specimen Description URINE, CLEAN CATCH  Final   Special Requests NONE  Final   Culture   Final    NO GROWTH Performed at [facility], [address], [place], [place] [address]    Report Status 01/06/2022 FINAL  Final  Aerobic/Anaerobic Culture w Gram Stain (surgical/deep wound)     Status: None (Preliminary result)   Collection Time: 01/06/22 11:30 AM   Specimen: PATH Other; Tissue  Result Value Ref Range Status   Specimen Description ABSCESS  Final   Special Requests GROIN  Final   Gram Stain   Final    ABUNDANT WBC PRESENT,BOTH PMN AND MONONUCLEAR FEW GRAM POSITIVE COCCI IN PAIRS FEW GRAM POSITIVE RODS FEW GRAM NEGATIVE RODS    Culture   Final    MODERATE ESCHERICHIA COLI MODERATE STREPTOCOCCUS ANGINOSIS Beta hemolytic streptococci are predictably susceptible to penicillin and other beta lactams. Susceptibility testing not routinely performed. MODERATE BACTEROIDES FRAGILIS BETA LACTAMASE POSITIVE Performed at [facility]  Lab, [address], [place], [place] [address]    Report Status PENDING  Incomplete   Organism ID, Bacteria ESCHERICHIA COLI  Final      Susceptibility   Escherichia coli - MIC*     AMPICILLIN >=32 RESISTANT Resistant     CEFAZOLIN <=4 SENSITIVE Sensitive     CEFEPIME <=0.12 SENSITIVE Sensitive     CEFTAZIDIME <=1 SENSITIVE Sensitive     CEFTRIAXONE <=0.25 SENSITIVE Sensitive     CIPROFLOXACIN <=0.25 SENSITIVE Sensitive     GENTAMICIN >=16 RESISTANT Resistant     IMIPENEM <=0.25 SENSITIVE Sensitive     TRIMETH/SULFA >=320 RESISTANT Resistant     AMPICILLIN/SULBACTAM 16 INTERMEDIATE Intermediate     PIP/TAZO <=4 SENSITIVE Sensitive     * MODERATE ESCHERICHIA COLI    Labs: CBC: Recent Labs  Lab 01/05/22 1359 01/06/22 0345 01/07/22 0628 01/08/22 0310 01/09/22 0249  WBC 7.8 11.7* 10.4 7.5 7.0  NEUTROABS  --   --   --   --  4.2  HGB 14.6 11.3* 11.1* 11.4* 11.0*  HCT 46.6 34.5* 33.8* 33.7* 33.6*  MCV 91.0 87.8 86.7 85.3 86.6  PLT 255 240 234 263 260   Basic Metabolic Panel: Recent Labs  Lab 01/05/22 1359 01/06/22 0345 01/07/22 0628 01/08/22 0310 01/09/22 0249  NA 132* 134* 136 135 136  K 4.2 3.8 3.7 3.5 4.0  CL 102 103 104 105 102  CO2 20* 21* 23 23 26   GLUCOSE 95 103* 106* 85 93  BUN 7* 7* 10 8 10   CREATININE 0.87 0.76 0.80 0.89 0.85  CALCIUM 8.6* 8.4* 8.5* 8.0* 8.5*   Liver Function Tests: Recent Labs  Lab 01/05/22 1359 01/06/22 0345 01/08/22 0310 01/09/22 0249  AST 54* 32 28 37  ALT 31 24 22 28   ALKPHOS 76 55 48 53  BILITOT 0.8 0.6 0.3 0.3  PROT 8.9* 6.9 6.4* 6.6  ALBUMIN 2.5* 1.9* 1.8* 1.9*   CBG: Recent Labs  Lab 01/06/22 0954  GLUCAP 101*    Discharge time spent: approximately 35 minutes spent on discharge counseling, evaluation of patient on day of discharge, and coordination of discharge planning with nursing, social work, pharmacy and case management  Signed: 03/11/22, MD [facility] 01/10/2022

## 2022-01-10 NOTE — Progress Notes (Signed)
Discharge instructions given at this time and TOC meds delivered to bedside. Pt has made multiple attempts to reach family (sister and niece) for transportation home. He has been unable to reach them but will continue to do so. Pt remains awake and stable at baseline.

## 2022-01-13 LAB — AEROBIC/ANAEROBIC CULTURE W GRAM STAIN (SURGICAL/DEEP WOUND)

## 2022-01-15 LAB — AEROBIC/ANAEROBIC CULTURE W GRAM STAIN (SURGICAL/DEEP WOUND)
Culture: NO GROWTH
Gram Stain: NONE SEEN

## 2022-05-06 ENCOUNTER — Encounter (HOSPITAL_BASED_OUTPATIENT_CLINIC_OR_DEPARTMENT_OTHER): Payer: 59 | Attending: Internal Medicine | Admitting: Internal Medicine

## 2022-08-03 ENCOUNTER — Emergency Department (HOSPITAL_COMMUNITY)
Admission: EM | Admit: 2022-08-03 | Discharge: 2022-08-03 | Disposition: A | Discharge status: Home / Self Care | Payer: 59 | Attending: Emergency Medicine | Admitting: Emergency Medicine

## 2022-08-03 ENCOUNTER — Emergency Department (HOSPITAL_COMMUNITY): Payer: 59

## 2022-08-03 ENCOUNTER — Other Ambulatory Visit: Payer: Self-pay

## 2022-08-03 ENCOUNTER — Encounter (HOSPITAL_COMMUNITY): Payer: Self-pay | Admitting: *Deleted

## 2022-08-03 DIAGNOSIS — L02214 Cutaneous abscess of groin: Secondary | ICD-10-CM | POA: Diagnosis present

## 2022-08-03 DIAGNOSIS — L0291 Cutaneous abscess, unspecified: Secondary | ICD-10-CM

## 2022-08-03 LAB — CBC WITH DIFFERENTIAL/PLATELET
Abs Immature Granulocytes: 0.01 10*3/uL (ref 0.00–0.07)
Basophils Absolute: 0.1 10*3/uL (ref 0.0–0.1)
Basophils Relative: 1 %
Eosinophils Absolute: 0 10*3/uL (ref 0.0–0.5)
Eosinophils Relative: 1 %
HCT: 42.2 % (ref 39.0–52.0)
Hemoglobin: 13.2 g/dL (ref 13.0–17.0)
Immature Granulocytes: 0 %
Lymphocytes Relative: 22 %
Lymphs Abs: 1.5 10*3/uL (ref 0.7–4.0)
MCH: 27.6 pg (ref 26.0–34.0)
MCHC: 31.3 g/dL (ref 30.0–36.0)
MCV: 88.1 fL (ref 80.0–100.0)
Monocytes Absolute: 0.7 10*3/uL (ref 0.1–1.0)
Monocytes Relative: 11 %
Neutro Abs: 4.4 10*3/uL (ref 1.7–7.7)
Neutrophils Relative %: 65 %
Platelets: 201 10*3/uL (ref 150–400)
RBC: 4.79 MIL/uL (ref 4.22–5.81)
RDW: 14.4 % (ref 11.5–15.5)
WBC: 6.6 10*3/uL (ref 4.0–10.5)
nRBC: 0 % (ref 0.0–0.2)

## 2022-08-03 LAB — BASIC METABOLIC PANEL
Anion gap: 9 (ref 5–15)
BUN: 7 mg/dL — ABNORMAL LOW (ref 8–23)
CO2: 24 mmol/L (ref 22–32)
Calcium: 9 mg/dL (ref 8.9–10.3)
Chloride: 103 mmol/L (ref 98–111)
Creatinine, Ser: 0.9 mg/dL (ref 0.61–1.24)
GFR, Estimated: >60 mL/min (ref >60)
Glucose, Bld: 93 mg/dL (ref 70–99)
Potassium: 4.4 mmol/L (ref 3.5–5.1)
Sodium: 136 mmol/L (ref 135–145)

## 2022-08-03 LAB — URINALYSIS, ROUTINE W REFLEX MICROSCOPIC
Bilirubin Urine: NEGATIVE
Glucose, UA: NEGATIVE mg/dL
Hgb urine dipstick: NEGATIVE
Ketones, ur: NEGATIVE mg/dL
Leukocytes,Ua: NEGATIVE
Nitrite: NEGATIVE
Protein, ur: NEGATIVE mg/dL
Specific Gravity, Urine: 1.013 (ref 1.005–1.030)
pH: 7 (ref 5.0–8.0)

## 2022-08-03 MED ORDER — VANCOMYCIN HCL IN DEXTROSE 1-5 GM/200ML-% IV SOLN
1000.0000 mg | Freq: Once | INTRAVENOUS | Status: AC
  Administered 2022-08-03: 1000 mg via INTRAVENOUS
  Filled 2022-08-03: qty 200

## 2022-08-03 MED ORDER — DOXYCYCLINE HYCLATE 100 MG PO TABS
100.0000 mg | ORAL_TABLET | Freq: Once | ORAL | Status: AC
  Administered 2022-08-03: 100 mg via ORAL
  Filled 2022-08-03: qty 1

## 2022-08-03 MED ORDER — DOXYCYCLINE HYCLATE 100 MG PO CAPS: 100.0000 mg | ORAL_CAPSULE | Freq: Two times a day (BID) | ORAL | 0 refills | Status: DC

## 2022-08-03 MED ORDER — IOHEXOL 350 MG/ML SOLN
75.0000 mL | Freq: Once | INTRAVENOUS | Status: AC | PRN
  Administered 2022-08-03: 75 mL via INTRAVENOUS

## 2022-08-03 NOTE — Discharge Instructions (Addendum)
You were started on antibiotic for the drainage and you need to follow-up with the general surgery office to make sure that is getting better.  Also vascular surgery wanted you to call their office so you can follow-up to make sure your blood vessels remain as healthy as they can.  If you start having fever, worsening swelling in the area return to the emergency room.

## 2022-08-03 NOTE — ED Provider Notes (Signed)
Assumed care from Dr. Hyacinth Meeker at 3:30 PM.  Patient CT returned showing no evidence of significant abscess but he has had interval development of long segment occlusion through the right external iliac artery and common femoral artery.  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  On evaluation patient's leg is warm and has normal color.  He does have purulent drainage from a tract in his inguinal area most likely related to a fistula.  Spoke with Dr. Juanetta Gosling with vascular surgery and at this time there is no immediate intervention that is required.  They would like to follow-up with him in the office for routine visits to ensure the vascular health of his good leg remains stable but no immediate intervention is required.  Also will have patient follow-up with general surgery and he was given a course of antibiotics.   Gwyneth Sprout, MD 08/03/22 1949

## 2022-08-03 NOTE — ED Triage Notes (Signed)
Pt  brought her by niece who states that he had a "drain" placed in his R groin in October to drain an infection.  He was supposed to go to the wound center to have the drain removed, but they would not remove it.  Purulent drainage noted at site.  No obvious drain noted.  Pain on palpation of area.

## 2022-08-03 NOTE — ED Provider Notes (Signed)
[place] [facility] AT [facility] Provider Note   CSN: [id number] Arrival date & time: 08/03/22  1331     History {Add pertinent medical, surgical, social history, OB history to HPI:1} Chief Complaint  Patient presents with   Abscess    Dominic Carter is a 67 y.o. male.   Abscess  67 year old male history of alcohol use disorder as well as seizure disorder, presenting to the hospital today complaining of right inguinal pain and drainage.  The patient unfortunately had a history of abscesses in the right groin over top of the mesh hernia that had been placed at an outside hospital during an inguinal hernia repair in the past.  The abscess developed and required surgical drainage procedure on January 06, 2022 by Dr. Dwain Sarna.  The patient also underwent a CT scan and an interventional radiology procedure to treat a renal abscess at that time.  He presents now approximately 6 months later stating that he has some recurrent discomfort in the right groin and is noticed a small draining sinus tract with some purulent material.  He describes having subjective fevers and chills but has not measured them, has not seen his family doctor, has been covering this with a bandage and stating that it is leaking pus and blood onto the bandage.    Home Medications Prior to Admission medications   Medication Sig Start Date End Date Taking? Authorizing Provider  folic acid (FOLVITE) 1 MG tablet Take 1 tablet (1 mg total) by mouth daily. 01/10/22   Danford, Earl Lites, MD  levETIRAcetam (KEPPRA) 500 MG tablet Take 1 tablet (500 mg total) by mouth 2 (two) times daily. 01/10/22   Danford, Earl Lites, MD  Multiple Vitamin (MULTIVITAMIN WITH MINERALS) TABS tablet Take 1 tablet by mouth daily. Patient not taking: Reported on 08/10/2021 09/05/20   Karsten Ro, MD  ondansetron (ZOFRAN) 4 MG tablet Take 1 tablet (4 mg total) by mouth every 6 (six) hours as needed for nausea. 01/10/22    Danford, Earl Lites, MD  oxyCODONE (OXY IR/ROXICODONE) 5 MG immediate release tablet Take 1 tablet (5 mg total) by mouth every 6 (six) hours as needed for breakthrough pain. 01/08/22   Maczis, Elmer Sow, PA-C  pravastatin (PRAVACHOL) 20 MG tablet Take 1 tablet (20 mg total) by mouth daily. 01/10/22   Danford, Earl Lites, MD  thiamine (VITAMIN B1) 100 MG tablet Take 1 tablet (100 mg total) by mouth daily. 01/11/22   Danford, Earl Lites, MD      Allergies    Castor oil and Other    Review of Systems   Review of Systems  All other systems reviewed and are negative.   Physical Exam Updated Vital Signs BP 124/77 (BP Location: Right Arm)   Pulse 64   Temp 98.1 F (36.7 C)   Resp 16   Ht 1.803 m (5\' 11" )   Wt 74.8 kg   SpO2 100%   BMI 23.00 kg/m  Physical Exam Vitals and nursing note reviewed.  Constitutional:      General: He is not in acute distress.    Appearance: He is well-developed.  HENT:     Head: Normocephalic and atraumatic.     Mouth/Throat:     Pharynx: No oropharyngeal exudate.  Eyes:     General: No scleral icterus.       Right eye: No discharge.        Left eye: No discharge.     Conjunctiva/sclera: Conjunctivae normal.  Pupils: Pupils are equal, round, and reactive to light.  Neck:     Thyroid: No thyromegaly.     Vascular: No JVD.  Cardiovascular:     Rate and Rhythm: Normal rate and regular rhythm.     Heart sounds: Normal heart sounds. No murmur heard.    No friction rub. No gallop.  Pulmonary:     Effort: Pulmonary effort is normal. No respiratory distress.     Breath sounds: Normal breath sounds. No wheezing or rales.  Abdominal:     General: Bowel sounds are normal. There is no distension.     Palpations: Abdomen is soft. There is no mass.     Tenderness: There is no abdominal tenderness.  Genitourinary:    Comments: Genitourinary exam is unremarkable, there is a draining inguinal lesion which appears to be a small opening with some  purulent and bloody drainage.  There is no discrete fluid collection clinically, there is no bulging induration or fluctuance Musculoskeletal:        General: No tenderness. Normal range of motion.     Cervical back: Normal range of motion and neck supple.     Comments: Below the knee amputation present, stump well-healed  Lymphadenopathy:     Cervical: No cervical adenopathy.  Skin:    General: Skin is warm and dry.     Findings: No erythema or rash.  Neurological:     Mental Status: He is alert.     Coordination: Coordination normal.  Psychiatric:        Behavior: Behavior normal.     ED Results / Procedures / Treatments   Labs (all labs ordered are listed, but only abnormal results are displayed) Labs Reviewed  CBC WITH DIFFERENTIAL/PLATELET  BASIC METABOLIC PANEL    EKG None  Radiology No results found.  Procedures Procedures  {Document cardiac monitor, telemetry assessment procedure when appropriate:1}  Medications Ordered in ED Medications - No data to display  ED Course/ Medical Decision Making/ A&P   {   Click here for ABCD2, HEART and other calculatorsREFRESH Note before signing :1}                          Medical Decision Making Amount and/or Complexity of Data Reviewed Labs: ordered. Radiology: ordered.    This patient presents to the ED for concern of inguinal recurrent infection, this involves an extensive number of treatment options, and is a complaint that carries with it a high risk of complications and morbidity.  The differential diagnosis includes abscess, drainage of a sinus tract, could be a enterocutaneous fistula, cellulitis, abscess   Co morbidities that complicate the patient evaluation  Alcoholism Prior significant infection   Additional history obtained:  Additional history obtained from medical record External records from outside source obtained and reviewed including surgical notes, imaging procedures, recent  hospitalization 6 months ago for same   Lab Tests:  I Ordered, and personally interpreted labs.  The pertinent results include:  ***   Imaging Studies ordered:  I ordered imaging studies including ***  I independently visualized and interpreted imaging which showed *** I agree with the radiologist interpretation   Cardiac Monitoring: / EKG:  The patient was maintained on a cardiac monitor.  I personally viewed and interpreted the cardiac monitored which showed an underlying rhythm of: ***   Consultations Obtained:  I requested consultation with the ***,  and discussed lab and imaging findings as well as pertinent plan -  they recommend: ***   Problem List / ED Course / Critical interventions / Medication management  *** I ordered medication including ***  for ***  Reevaluation of the patient after these medicines showed that the patient {resolved/improved/worsened:23923::"improved"} I have reviewed the patients home medicines and have made adjustments as needed   Social Determinants of Health:  ***   Test / Admission - Considered:  ***   {Document critical care time when appropriate:1} {Document review of labs and clinical decision tools ie heart score, Chads2Vasc2 etc:1}  {Document your independent review of radiology images, and any outside records:1} {Document your discussion with family members, caretakers, and with consultants:1} {Document social determinants of health affecting pt's care:1} {Document your decision making why or why not admission, treatments were needed:1} Final Clinical Impression(s) / ED Diagnoses Final diagnoses:  None    Rx / DC Orders ED Discharge Orders     None

## 2022-08-06 ENCOUNTER — Other Ambulatory Visit: Payer: Self-pay | Admitting: *Deleted

## 2022-08-06 ENCOUNTER — Ambulatory Visit (HOSPITAL_COMMUNITY): Admission: RE | Admit: 2022-08-06 | Payer: 59 | Source: Ambulatory Visit

## 2022-08-06 DIAGNOSIS — M79605 Pain in left leg: Secondary | ICD-10-CM

## 2022-08-12 ENCOUNTER — Encounter: Payer: 59 | Admitting: Vascular Surgery

## 2022-08-14 ENCOUNTER — Ambulatory Visit (HOSPITAL_COMMUNITY)
Admission: RE | Admit: 2022-08-14 | Discharge: 2022-08-14 | Disposition: A | Discharge status: Home / Self Care | Payer: 59 | Source: Ambulatory Visit | Attending: Vascular Surgery | Admitting: Vascular Surgery

## 2022-08-14 DIAGNOSIS — M79604 Pain in right leg: Secondary | ICD-10-CM | POA: Diagnosis present

## 2022-08-14 DIAGNOSIS — M79605 Pain in left leg: Secondary | ICD-10-CM | POA: Diagnosis present

## 2022-08-14 LAB — VAS US ABI WITH/WO TBI: Left ABI: 0.73

## 2022-08-20 ENCOUNTER — Ambulatory Visit (INDEPENDENT_AMBULATORY_CARE_PROVIDER_SITE_OTHER): Payer: 59 | Admitting: Vascular Surgery

## 2022-08-20 ENCOUNTER — Encounter: Payer: Self-pay | Admitting: Vascular Surgery

## 2022-08-20 VITALS — BP 123/77 | HR 84 | Temp 98.4°F | Resp 20 | Ht 71.0 in | Wt 164.0 lb

## 2022-08-20 DIAGNOSIS — I739 Peripheral vascular disease, unspecified: Secondary | ICD-10-CM | POA: Diagnosis not present

## 2022-08-20 NOTE — Progress Notes (Signed)
Patient ID: Dominic Carter, male   DOB: 09/17/1955, 67 y.o.   MRN: [id number]  Reason for Consult: New Patient (Initial Visit)   Referred by Gwyneth Sprout, MD  Subjective:     HPI:  Dominic Carter is a 67 y.o. male with history of right below-knee amputation approximately 20 years ago.  He also had a stent somewhere around that time placed right iliac artery.  More recently he has infected mesh from inguinal hernia repair which he has been dealing with since about October.  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  He does have leg pain on the left but walks with the help of a walker occasionally uses a wheelchair.  No wounds and no frank rest pain waking him from sleep.  His below-knee amputation remains well-healed.  He continues to smoke daily does not take any antiplatelet agents but is on Pravachol.  Past Medical History:  Diagnosis Date   DVT (deep venous thrombosis) (HCC)    Dyslipidemia    ETOH abuse    Hx of BKA, right (HCC)    Hypertension    Seizure disorder (HCC)    Family History  Problem Relation Age of Onset   Hypertension Mother    Hypertension Sister    Hypertension Brother    Hypertension Maternal Grandmother    Hypertension Maternal Grandfather    Past Surgical History:  Procedure Laterality Date   BELOW KNEE LEG AMPUTATION Right    IRRIGATION AND DEBRIDEMENT ABSCESS Right 01/06/2022   Procedure: IRRIGATION AND DEBRIDEMENT ABSCESS;  Surgeon: Emelia Loron, MD;  Location: [facility] OR;  Service: General;  Laterality: Right;    Short Social History:  Social History   Tobacco Use   Smoking status: Every Day    Packs/day: 1    Types: Cigarettes   Smokeless tobacco: Never  Substance Use Topics   Alcohol use: Not Currently    Comment: Last beer 01/2022    Allergies  Allergen Reactions   Penicillins Anaphylaxis and Rash   Sulfa Antibiotics Rash   Castor Oil Nausea And Vomiting   Other  Nausea And Vomiting and Other (See Comments)    Boiled Fat back meat    Current Outpatient Medications  Medication Sig Dispense Refill   amLODipine (NORVASC) 10 MG tablet Take 10 mg by mouth daily.     famotidine (PEPCID) 40 MG tablet Take 40 mg by mouth daily.     folic acid (FOLVITE) 1 MG tablet Take 1 tablet (1 mg total) by mouth daily. (Patient not taking: Reported on 08/03/2022) 30 tablet 3   levETIRAcetam (KEPPRA) 500 MG tablet Take 1 tablet (500 mg total) by mouth 2 (two) times daily. (Patient not taking: Reported on 08/03/2022) 60 tablet 3   Multiple Vitamin (MULTIVITAMIN WITH MINERALS) TABS tablet Take 1 tablet by mouth daily. (Patient not taking: Reported on 08/10/2021)     ondansetron (ZOFRAN) 4 MG tablet Take 1 tablet (4 mg total) by mouth every 6 (six) hours as needed for nausea. (Patient not taking: Reported on 08/03/2022) 20 tablet 0   oxyCODONE (OXY IR/ROXICODONE) 5 MG immediate release tablet Take 1 tablet (5 mg total) by mouth every 6 (six) hours as needed for breakthrough pain. (Patient not taking: Reported on 08/03/2022) 15 tablet 0   pravastatin (PRAVACHOL) 20 MG tablet Take 1 tablet (20 mg total) by mouth daily. (Patient not taking: Reported on 08/20/2022) 30 tablet 3   thiamine (VITAMIN  B1) 100 MG tablet Take 1 tablet (100 mg total) by mouth daily. (Patient not taking: Reported on 08/03/2022) 30 tablet 3   No current facility-administered medications for this visit.    Review of Systems  Constitutional:  Constitutional negative. HENT: HENT negative.  Eyes: Eyes negative.  Respiratory: Respiratory negative.  Cardiovascular: Cardiovascular negative.  GI: Gastrointestinal negative.  Musculoskeletal: Musculoskeletal negative.  Skin: Skin negative.  Neurological: Neurological negative. Hematologic: Hematologic/lymphatic negative.  Psychiatric: Psychiatric negative.        Objective:  Objective   Vitals:   08/20/22 0922  BP: 123/77  Pulse: 84  Resp: 20  Temp: 98.4  F (36.9 C)  SpO2: 98%  Weight: 164 lb (74.4 kg)  Height: 5\' 11"  (1.803 m)   Body mass index is 22.87 kg/m.  Physical Exam HENT:     Head: Normocephalic.     Mouth/Throat:     Mouth: Mucous membranes are moist.  Eyes:     Pupils: Pupils are equal, round, and reactive to light.  Cardiovascular:     Pulses:          Femoral pulses are 0 on the right side and 0 on the left side. Pulmonary:     Effort: Pulmonary effort is normal.  Abdominal:     General: Abdomen is flat.  Musculoskeletal:     Cervical back: Neck supple.     Left lower leg: No edema.     Comments: R bka  Neurological:     Mental Status: He is alert.     Data: CT IMPRESSION: 1. Interval resolution of anterior pelvic body wall abscess. 2. Interval development of long segment occlusion through the right external iliac artery and common femoral artery. 3. 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 4.  Aortic Atherosclerosis (ICD10-I70.0).  Left     Lt Pressure (mmHg)IndexWaveformComment  +---------+------------------+-----+--------+-------+  Brachial 126                                     +---------+------------------+-----+--------+-------+  PTA     92                0.73 biphasic         +---------+------------------+-----+--------+-------+  DP      91                0.72 biphasic         +---------+------------------+-----+--------+-------+  Great Toe56                0.44                  +---------+------------------+-----+--------+-------+   +-------+-----------+-----------+------------+------------+  ABI/TBIToday's ABIToday's TBIPrevious ABIPrevious TBI  +-------+-----------+-----------+------------+------------+  Right BKA        BKA                                  +-------+-----------+-----------+------------+------------+  Left  0.73       0.44                                  +-------+-----------+-----------+------------+------------+       Summary:  Left: Resting left ankle-brachial index indicates moderate left lower  extremity arterial disease. The left toe-brachial index is abnormal.  Assessment/Plan:    67 year old male with a history of right below-knee amputation and stenting of the right common iliac artery which appears occluded distally in the left common iliac artery is occluded and very diminutive.  I am unsure what endovascular options he would even have at this time unless we extended the stent far up into his aorta down to the left common iliac artery.  Either way he does not have any symptoms to merit this currently with ABIs that are moderately decreased and no tissue loss.  I recommended strict smoking cessation as well as beginning 81 mg aspirin daily and continuing Pravachol.  He will follow-up in 1 year with repeat ABIs.     Maeola Harman MD [facility]

## 2022-08-28 ENCOUNTER — Other Ambulatory Visit: Payer: Self-pay

## 2022-08-28 DIAGNOSIS — I739 Peripheral vascular disease, unspecified: Secondary | ICD-10-CM

## 2022-12-02 ENCOUNTER — Other Ambulatory Visit: Payer: Self-pay

## 2022-12-02 ENCOUNTER — Ambulatory Visit (INDEPENDENT_AMBULATORY_CARE_PROVIDER_SITE_OTHER): Payer: 59 | Admitting: Internal Medicine

## 2022-12-02 ENCOUNTER — Encounter: Payer: Self-pay | Admitting: Internal Medicine

## 2022-12-02 VITALS — BP 118/76 | HR 72 | Temp 98.3°F | Wt 135.0 lb

## 2022-12-02 DIAGNOSIS — L02214 Cutaneous abscess of groin: Secondary | ICD-10-CM | POA: Diagnosis not present

## 2022-12-02 MED ORDER — AMOXICILLIN-POT CLAVULANATE 875-125 MG PO TABS: 1.0000 | ORAL_TABLET | Freq: Two times a day (BID) | ORAL | 0 refills | Status: DC

## 2022-12-02 NOTE — Progress Notes (Signed)
Patient ID: BRENN Carter, male   DOB: 1955-10-05, 67 y.o.   MRN: [id number]    [facility] for Infectious Disease      Reason for Consult: abscess    Referring Physician: Kirstie Mirza [employer]    Patient ID: Dominic Carter, male    DOB: [birth year], 67 y.o.   MRN: [id number]  HPI:   Mr Dominic Carter is here for evaluation of a groin abscess. He has a history of right inguinal hernia repair in 2016 in [facility] by Dr. Gilford Rile with an abscess in the area in October 2023 and then recurrent abscess earlier this month.  He is s/p I and D and sent here for antibiotic selection.  No cultures noted in the chart.  Previous culture was E coli, Strep anginosis and Bacteroides.  Sent here for ? Need for prolonged antibiotics.  Mesh reportedly in place though was not seen during I and D in October.  Has not been back to original surgeon.   Past Medical History:  Diagnosis Date   DVT (deep venous thrombosis) (HCC)    Dyslipidemia    ETOH abuse    Hx of BKA, right (HCC)    Hypertension    Seizure disorder (HCC)     Prior to Admission medications   Medication Sig Start Date End Date Taking? Authorizing Provider  amLODipine (NORVASC) 10 MG tablet Take 10 mg by mouth daily. 08/18/19   [provider]  famotidine (PEPCID) 40 MG tablet Take 40 mg by mouth daily. 04/25/22   [provider]  folic acid (FOLVITE) 1 MG tablet Take 1 tablet (1 mg total) by mouth daily. Patient not taking: Reported on 08/03/2022 01/10/22   Alberteen Sam, MD  levETIRAcetam (KEPPRA) 500 MG tablet Take 1 tablet (500 mg total) by mouth 2 (two) times daily. Patient not taking: Reported on 08/03/2022 01/10/22   Alberteen Sam, MD  Multiple Vitamin (MULTIVITAMIN WITH MINERALS) TABS tablet Take 1 tablet by mouth daily. Patient not taking: Reported on 08/10/2021 09/05/20   Karsten Ro, MD  ondansetron (ZOFRAN) 4 MG tablet Take 1 tablet (4 mg total) by mouth every 6 (six) hours as needed for nausea. Patient not  taking: Reported on 08/03/2022 01/10/22   Alberteen Sam, MD  oxyCODONE (OXY IR/ROXICODONE) 5 MG immediate release tablet Take 1 tablet (5 mg total) by mouth every 6 (six) hours as needed for breakthrough pain. Patient not taking: Reported on 08/03/2022 01/08/22   Jacinto Halim, PA-C  pravastatin (PRAVACHOL) 20 MG tablet Take 1 tablet (20 mg total) by mouth daily. Patient not taking: Reported on 08/20/2022 01/10/22   Alberteen Sam, MD  thiamine (VITAMIN B1) 100 MG tablet Take 1 tablet (100 mg total) by mouth daily. Patient not taking: Reported on 08/03/2022 01/11/22   Alberteen Sam, MD    Allergies  Allergen Reactions   Penicillins Anaphylaxis and Rash   Sulfa Antibiotics Rash   Castor Oil Nausea And Vomiting   Other Nausea And Vomiting and Other (See Comments)    Boiled Fat back meat    Social History   Tobacco Use   Smoking status: Every Day    Current packs/day: 1.00    Types: Cigarettes   Smokeless tobacco: Never  Vaping Use   Vaping status: Never Used  Substance Use Topics   Alcohol use: Not Currently    Comment: Last beer 01/2022   Drug use: No    Family History  Problem Relation Age of Onset  Hypertension Mother    Hypertension Sister    Hypertension Brother    Hypertension Maternal Grandmother    Hypertension Maternal Grandfather      Review of Systems  Constitutional: negative for fevers and chills All other systems reviewed and are negative    Constitutional: in no apparent distress There were no vitals filed for this visit. EYES: anicteric Respiratory: normal respiratory effort GU: right groin area with purulence, open area, firm induration around wound  Labs: Lab Results  Component Value Date   WBC 6.6 08/03/2022   HGB 13.2 08/03/2022   HCT 42.2 08/03/2022   MCV 88.1 08/03/2022   PLT 201 08/03/2022    Lab Results  Component Value Date   CREATININE 0.90 08/03/2022   BUN 7 (L) 08/03/2022   NA 136 08/03/2022   K 4.4  08/03/2022   CL 103 08/03/2022   CO2 24 08/03/2022    Lab Results  Component Value Date   ALT 28 01/09/2022   AST 37 01/09/2022   ALKPHOS 53 01/09/2022   BILITOT 0.3 01/09/2022   INR 1.2 01/09/2022     Assessment: right groin abscess, recurrent.  No recent cultures available after recent I and D.  Had been on doxycycline but likely needs different coverage.   Will give amoxicillin/clavulanate Needs I and D and I do suspect there is some foreign material/mesh in place that would need to be removed, if so.  I will refer back to his surgeon.  Discussed his reported allergy to penicilin and he denies any allergy, no previous reaction to a penicillin antibiotic.   This was removed.    Plan: 1)  Augmentin for 2 weeks 2) referral to his surgeon for repeat I and D and if mesh in place and needs removal.

## 2022-12-04 ENCOUNTER — Other Ambulatory Visit: Payer: Self-pay | Admitting: General Surgery

## 2022-12-04 DIAGNOSIS — L02214 Cutaneous abscess of groin: Secondary | ICD-10-CM

## 2022-12-16 NOTE — Progress Notes (Signed)
 [facility] New Patient Evaluation    Date: 01/03/2023 Patient Name: Dominic Carter MRN: [id number] PCP: No primary care provider on file. Referring Provider: Emerick Avelina Hastings,*    Assessment and Plan    Mr. Dominic Carter is a 67 y.o. male with a history of right posterior MCA stroke, alcohol-related hepatitis, HTN, HLD, DVT, right BKA presenting for evaluation of seizures.   #History of alcohol withdrawal seizures He has stopped drinking alcohol almost 1 year ago, and has had no further seizure episodes. He is not on any antiseizure medications, and I am not inclined to start him back on Keppra , as his seizures were all likely provoked by withdraw rather than underlying epilepsy. The patient and his neice would like one follow up appointment to ensure stability, and I agree. We discussed that if he would seek medication for smoking cessation, I would advise against wellbutrin due to his seizure history. PLAN:  - No need for antiseizure medications at this time - Continue to avoid alcohol, prioritize sleep hygiene - Return to clinic in 6 months  This patient was seen with neurology attending, Dr. Germaine, who agrees with the plan.   Subjective   HPI: Dominic Carter is a 67 y.o. male with history of right posterior MCA stroke, alcohol-related hepatitis, HTN, HLD, DVT, right BKA who presents for evaluation of seizures. He is accompanied today by his niece.   Dominic Carter has a history of alcohol abuse and withdraw seizures. He has stopped drinking, however, almost a year ago now, and is doing very well at home with his niece. He is not taking any antiseizure medications at this time. The last seizure he had was in May 2023, in the setting of alcohol withdraw.   Below I have summarized a few of his prior hospital visits for seizures, all of which occurred when he was regularly drinking alcohol.  HPI from admission to [facility] for  seizure 09/01/2021: Santez Woodcox presents after being witnessed having seizure at home by his nephew.  In route with EMS patient was witnessed having an 2-minute seizure. Upon admission to the emergency department patient was noted to be afebrile with pulse 86-107, blood pressure 157/97 to 172/96, and O2 saturation currently maintained on 2 L of nasal cannula oxygen.  Patient was witnessed having nausea and vomiting.  He was also  witnessed having generalized tonic-clonic seizure lasting 2-3 minutes.  Patient had been given 2 mg of Ativan  and loaded with Keppra  1 g IV.  Inpatient neuro note from 08/28/2020: Per chart, patient was at home this am watching TV and his hand began to shake. He sat down without LOC, fall, or striking of head. Unclear if patient had a seizure at his home and if it was witnessed. Also, unclear per chart as to why 911 was called. However, en route, patient had a 30-45 second GTC seizure with urinary and fecal incontinence. Patient was able to maintain his airway.   In review of chart, last medical note NP can find is from an exam in SNF acute rehab in 01/2020 s/p hospital stay for seizure. EEG from that hospital stay in 10/21 was positive for activity suggestive of cortical dysfunction in right temporal region consistent with chronic right temporal infarct. No seizures or epileptiform discharges were seen. At that time, AEDs were started due to withdrawal from ETOH not thought to be a factor given patient's ethanol level was high on admission. Keppra  500mg  po bid was  started due to risk of seizures given chronic infarct.    Objective    Vital signs: BP 118/69   Pulse 61   Temp 98.1 F (36.7 C)   Resp 17   Ht 1.803 m (5' 11)   SpO2 100%   BMI 19.11 kg/m   Physical Exam: GENERAL: Well developed, well nourished, no acute distress   MENTAL STATUS EXAM:    Orientation: Alert and oriented to person, place and time Language: Speech is clear and language is normal    CRANIAL NERVES:    CN 2 (Optic): Visual fields intact to confrontation CN 3,4,6 (EOM): Pupils equal and reactive to light. Full extraocular eye movement without nystagmus CN 5 (Trigeminal): Facial sensation is normal CN 7 (Facial): No facial weakness or asymmetry CN 8 (Auditory): Mildly hard of hearing CN 9,10 (Glossophar): The uvula is midline, the palate elevates symmetrically CN 11 (spinal access): Normal sternocleidomastoid and trapezius strength CN 12 (Hypoglossal): The tongue is midline   MOTOR:  Muscle Strength:  Full strength in bilateral upper extremities. Right BKA, 5/5 hip flexion.    REFLEXES: DTRs - 2+ and symmetrical in all four extremities, plantar responses are flexor bilaterally.    COORDINATION: Intact finger-to-nose bilaterally, no tremor   SENSATION: Intact to light touch in bilateral upper extremities   GAIT: Patient in a wheelchair   Diagnostic Studies   The below studies were independently reviewed:  EEG (21 minutes, done at [facility] May 2023): The posterior dominant rhythm consists of 8-9 Hz activity of moderate voltage (25-35 uV) seen predominantly in posterior head regions, symmetric and reactive to eye opening and eye closing. Sleep was characterized by vertex waves, sleep spindles (12 to 14 Hz), maximal frontocentral region. EEG showed intermittent generalized 3 to 6 Hz theta-delta slowing in right temporal region.  --This study is suggestive of cortical dysfunction in right temporal region likely secondary to underlying stroke. No seizures or epileptiform discharges were seen throughout the recording.   EEG (26 minutes, done at [facility] 01/20/2020): The posterior dominant rhythm consists of 9 Hz  activity of moderate voltage (25-35 uV) seen predominantly in posterior head regions, symmetric and reactive to eye opening and eye closing. Sleep was characterized by vertex waves, sleep spindles (12 to 14 Hz), maximal frontocentral region.  EEG showed  continuous 3 to 6 Hz theta-delta slowing in right temporal region. This study is suggestive of cortical dysfunction in right temporal region consistent with underlying stroke.  No seizures or epileptiform discharges were seen throughout the recording.   MRI brain WO (I cannot see the images, only the read) 01/20/2020 [place]: NAICA. Remote posterior right MCA infarct. Remote bilateral lacunar infarcts in the cerebellum. Moderately age-advanced atrophy and white matter disease, likely reflecting sequela of chronic microvascular ischemia.

## 2023-01-06 NOTE — Progress Notes (Signed)
 I, Dr. Belvie Hock, have personally seen and examined the patient and discussed the case with the resident physician. I have reviewed the note and agree with the plan.

## 2023-01-20 ENCOUNTER — Other Ambulatory Visit: Payer: 59

## 2023-01-22 ENCOUNTER — Ambulatory Visit
Admission: RE | Admit: 2023-01-22 | Discharge: 2023-01-22 | Disposition: A | Discharge status: Home / Self Care | Payer: 59 | Source: Ambulatory Visit | Attending: General Surgery | Admitting: General Surgery

## 2023-01-22 DIAGNOSIS — L02214 Cutaneous abscess of groin: Secondary | ICD-10-CM

## 2023-01-22 MED ORDER — IOPAMIDOL (ISOVUE-300) INJECTION 61%
100.0000 mL | Freq: Once | INTRAVENOUS | Status: AC | PRN
  Administered 2023-01-22: 100 mL via INTRAVENOUS

## 2023-02-05 ENCOUNTER — Ambulatory Visit: Payer: 59 | Attending: Family Medicine

## 2023-02-19 ENCOUNTER — Other Ambulatory Visit (HOSPITAL_COMMUNITY): Payer: Self-pay | Admitting: General Surgery

## 2023-02-19 DIAGNOSIS — L02214 Cutaneous abscess of groin: Secondary | ICD-10-CM

## 2023-02-23 NOTE — Progress Notes (Signed)
Dominic Overlie, MD  Dominic Carter Discussed with Dwain Sarna. CT guided aspiration / biopsy of tissue or fluid along right inguinal region and right iliopsoas on CT image 45, series 7.  Potential drain but unlikely.  May want to use [place] at same time.    Try to schedule with Junie Bame       Previous Messages    ----- Message ----- From: Dominic Carter Sent: 02/19/2023   1:46 PM EST To: Caroleen Hamman, NT; Dominic Carter; * Subject: [place] fine needle asp 1st lesion                  Procedure: [place] fine needle asp 1st lesion  Reason: Inguinal abcess Dx: Inguinal abscess [L02.214 (ICD-10-CM)]    History: CT Pelvis w. Contrast  Provider: Emelia Loron, MD  Provider Contact: [phone]

## 2023-03-12 ENCOUNTER — Other Ambulatory Visit: Payer: Self-pay | Admitting: Radiology

## 2023-03-12 DIAGNOSIS — K6812 Psoas muscle abscess: Secondary | ICD-10-CM

## 2023-03-16 ENCOUNTER — Other Ambulatory Visit: Payer: Self-pay | Admitting: Student

## 2023-03-17 ENCOUNTER — Ambulatory Visit (HOSPITAL_COMMUNITY)
Admission: RE | Admit: 2023-03-17 | Discharge: 2023-03-17 | Disposition: A | Discharge status: Home / Self Care | Payer: 59 | Source: Ambulatory Visit | Attending: General Surgery | Admitting: General Surgery

## 2023-03-31 ENCOUNTER — Other Ambulatory Visit: Payer: Self-pay

## 2023-03-31 ENCOUNTER — Other Ambulatory Visit (HOSPITAL_COMMUNITY): Payer: Self-pay | Admitting: Student

## 2023-03-31 ENCOUNTER — Emergency Department (HOSPITAL_COMMUNITY): Payer: 59

## 2023-03-31 ENCOUNTER — Emergency Department (HOSPITAL_COMMUNITY)
Admission: EM | Admit: 2023-03-31 | Discharge: 2023-03-31 | Disposition: A | Discharge status: Home / Self Care | Payer: 59 | Attending: Emergency Medicine | Admitting: Emergency Medicine

## 2023-03-31 DIAGNOSIS — I10 Essential (primary) hypertension: Secondary | ICD-10-CM | POA: Insufficient documentation

## 2023-03-31 DIAGNOSIS — R42 Dizziness and giddiness: Secondary | ICD-10-CM

## 2023-03-31 DIAGNOSIS — L02214 Cutaneous abscess of groin: Secondary | ICD-10-CM

## 2023-03-31 DIAGNOSIS — Z79899 Other long term (current) drug therapy: Secondary | ICD-10-CM | POA: Diagnosis not present

## 2023-03-31 LAB — CBC WITH DIFFERENTIAL/PLATELET
Abs Immature Granulocytes: 0.03 10*3/uL (ref 0.00–0.07)
Basophils Absolute: 0.1 10*3/uL (ref 0.0–0.1)
Basophils Relative: 1 %
Eosinophils Absolute: 0.1 10*3/uL (ref 0.0–0.5)
Eosinophils Relative: 2 %
HCT: 39.5 % (ref 39.0–52.0)
Hemoglobin: 12.4 g/dL — ABNORMAL LOW (ref 13.0–17.0)
Immature Granulocytes: 1 %
Lymphocytes Relative: 25 %
Lymphs Abs: 1.4 10*3/uL (ref 0.7–4.0)
MCH: 27.4 pg (ref 26.0–34.0)
MCHC: 31.4 g/dL (ref 30.0–36.0)
MCV: 87.2 fL (ref 80.0–100.0)
Monocytes Absolute: 0.8 10*3/uL (ref 0.1–1.0)
Monocytes Relative: 15 %
Neutro Abs: 3.2 10*3/uL (ref 1.7–7.7)
Neutrophils Relative %: 56 %
Platelets: 247 10*3/uL (ref 150–400)
RBC: 4.53 MIL/uL (ref 4.22–5.81)
RDW: 15.4 % (ref 11.5–15.5)
WBC: 5.5 10*3/uL (ref 4.0–10.5)
nRBC: 0 % (ref 0.0–0.2)

## 2023-03-31 LAB — BASIC METABOLIC PANEL
Anion gap: 9 (ref 5–15)
BUN: 9 mg/dL (ref 8–23)
CO2: 26 mmol/L (ref 22–32)
Calcium: 9 mg/dL (ref 8.9–10.3)
Chloride: 104 mmol/L (ref 98–111)
Creatinine, Ser: 0.96 mg/dL (ref 0.61–1.24)
GFR, Estimated: >60 mL/min (ref >60)
Glucose, Bld: 97 mg/dL (ref 70–99)
Potassium: 5.1 mmol/L (ref 3.5–5.1)
Sodium: 139 mmol/L (ref 135–145)

## 2023-03-31 LAB — I-STAT CG4 LACTIC ACID, ED: Lactic Acid, Venous: 1.3 mmol/L (ref 0.5–1.9)

## 2023-03-31 LAB — TROPONIN I (HIGH SENSITIVITY): Troponin I (High Sensitivity): 6 ng/L (ref <18)

## 2023-03-31 LAB — MAGNESIUM: Magnesium: 2.2 mg/dL (ref 1.7–2.4)

## 2023-03-31 MED ORDER — IOHEXOL 350 MG/ML SOLN
75.0000 mL | Freq: Once | INTRAVENOUS | Status: AC | PRN
  Administered 2023-03-31: 75 mL via INTRAVENOUS

## 2023-03-31 MED ORDER — CEPHALEXIN 250 MG PO CAPS
500.0000 mg | ORAL_CAPSULE | Freq: Once | ORAL | Status: AC
  Administered 2023-03-31: 500 mg via ORAL
  Filled 2023-03-31: qty 2

## 2023-03-31 MED ORDER — DOXYCYCLINE HYCLATE 100 MG PO TABS
100.0000 mg | ORAL_TABLET | Freq: Once | ORAL | Status: AC
  Administered 2023-03-31: 100 mg via ORAL
  Filled 2023-03-31: qty 1

## 2023-03-31 MED ORDER — DOXYCYCLINE HYCLATE 100 MG PO CAPS: 100.0000 mg | ORAL_CAPSULE | Freq: Two times a day (BID) | ORAL | 0 refills | Status: AC

## 2023-03-31 MED ORDER — CEPHALEXIN 500 MG PO CAPS: 500.0000 mg | ORAL_CAPSULE | Freq: Four times a day (QID) | ORAL | 0 refills | Status: AC

## 2023-03-31 NOTE — Discharge Instructions (Addendum)
Your testing today shows that the infection that you have had previously is getting better, it is smaller but you do need to be on antibiotics, please take the following medicines  Doxycycline is an antibiotic which is taken twice a day, this treats bacterial infections that can cause staph infections, it treats sinus infections and some pneumonia. It also treats tick infections like Lyme's disease.  In this case I would like for you to take the antibiotic exactly as prescribed until it is completed.  Please be aware that occasionally people will get a rash if they are in the sunlight for extended periods of time while taking this medicine.  You have been prescribed Cephalexin, 500 mg capsules.  This is an antibiotic that is used to treat multiple different infections, please take this medicine until it has completed - even if you are feeling better immediately.  As a side effect, this may cause diarrhea in some people, if you are developing any signs of an allergic reaction please stop the medication immediately and call your doctor or return to the emergency department for worsening symptoms  Thank you for allowing [place] to treat you in the emergency department today.  After reviewing your examination and potential testing that was done it appears that you are safe to go home.  I would like for you to follow-up with your doctor within the next several days, have them obtain your records and follow-up with them to review all potential tests and results from your visit.  If you should develop severe or worsening symptoms return to the emergency department immediately  I have given you the phone number for Dr. Dwain Sarna, please call the office of this general surgeon to make a follow-up appointment, you should be seen within the next 2 weeks

## 2023-03-31 NOTE — ED Provider Notes (Signed)
[place] EMERGENCY DEPARTMENT AT [facility] Provider Note   CSN: [id number] Arrival date & time: 03/31/23  1133     History  Chief Complaint  Patient presents with   Dizziness    Dominic Carter is a 67 y.o. male.   Dizziness This patient is a 66 year old male with a history of hypertension, he also has a history of high cholesterol and peripheral arterial disease as well as seizure disorder.  He had actually undergone a right lower extremity amputation secondary to ischemia from peripheral arterial disease in the past, he has also struggled with a right inguinal abscess which appeared to be part of an intrapelvic or intra-abdominal or retroperitoneal abscess on the psoas muscle on the right side in the past as well.  He had had drains, surgical evaluation and ultimately had been on antibiotics and even admitted to the hospital in the past.  He most recently had a CT scan in October by the general surgeons and is supposed to be having some type of biopsy or procedure in the coming days to weeks regarding this.  He presents by EMS transport today because of a feeling of lightheaded and dizziness that occurred this morning while he was sitting on the couch watching TV.  He is frustrated by the fact that he still has drainage swelling and discomfort in the right inguinal region but denies any fevers or chills no nausea or vomiting, denies chest pain or shortness of breath at this time.  He states the lightheadedness is almost completely resolved.     Home Medications Prior to Admission medications   Medication Sig Start Date End Date Taking? Authorizing Provider  amLODipine (NORVASC) 10 MG tablet Take 10 mg by mouth daily. 08/18/19   [provider]  amoxicillin-clavulanate (AUGMENTIN) 875-125 MG tablet Take 1 tablet by mouth 2 (two) times daily. 12/02/22   Gardiner Barefoot, MD  Aspirin 81 MG CAPS 1 a day 10/08/22   [provider]  famotidine (PEPCID) 40 MG  tablet Take 40 mg by mouth daily. Patient not taking: Reported on 12/02/2022 04/25/22   [provider]  folic acid (FOLVITE) 1 MG tablet Take 1 tablet (1 mg total) by mouth daily. Patient not taking: Reported on 08/03/2022 01/10/22   Alberteen Sam, MD  levETIRAcetam (KEPPRA) 500 MG tablet Take 1 tablet (500 mg total) by mouth 2 (two) times daily. Patient not taking: Reported on 08/03/2022 01/10/22   Alberteen Sam, MD  Multiple Vitamin (MULTIVITAMIN WITH MINERALS) TABS tablet Take 1 tablet by mouth daily. Patient not taking: Reported on 08/10/2021 09/05/20   Karsten Ro, MD  ondansetron (ZOFRAN) 4 MG tablet Take 1 tablet (4 mg total) by mouth every 6 (six) hours as needed for nausea. Patient not taking: Reported on 08/03/2022 01/10/22   Alberteen Sam, MD  oxyCODONE (OXY IR/ROXICODONE) 5 MG immediate release tablet Take 1 tablet (5 mg total) by mouth every 6 (six) hours as needed for breakthrough pain. Patient not taking: Reported on 08/03/2022 01/08/22   Jacinto Halim, PA-C  pravastatin (PRAVACHOL) 20 MG tablet Take 1 tablet (20 mg total) by mouth daily. 01/10/22   Danford, Earl Lites, MD  thiamine (VITAMIN B1) 100 MG tablet Take 1 tablet (100 mg total) by mouth daily. Patient not taking: Reported on 08/03/2022 01/11/22   Alberteen Sam, MD      Allergies    Sulfa antibiotics, Castor oil, and Other    Review of Systems  Review of Systems  Neurological:  Positive for dizziness.    Physical Exam Updated Vital Signs BP 115/71 (BP Location: Right Arm)   Pulse 68   Temp 98.3 F (36.8 C) (Oral)   Resp 15   Ht 1.803 m (5\' 11" )   Wt 61.2 kg   SpO2 100%   BMI 18.83 kg/m  Physical Exam Vitals and nursing note reviewed.  Constitutional:      General: He is not in acute distress.    Appearance: He is well-developed.  HENT:     Head: Normocephalic and atraumatic.     Mouth/Throat:     Pharynx: No oropharyngeal exudate.  Eyes:     General: No  scleral icterus.       Right eye: No discharge.        Left eye: No discharge.     Conjunctiva/sclera: Conjunctivae normal.     Pupils: Pupils are equal, round, and reactive to light.  Neck:     Thyroid: No thyromegaly.     Vascular: No JVD.  Cardiovascular:     Rate and Rhythm: Normal rate and regular rhythm.     Heart sounds: Normal heart sounds. No murmur heard.    No friction rub. No gallop.  Pulmonary:     Effort: Pulmonary effort is normal. No respiratory distress.     Breath sounds: Normal breath sounds. No wheezing or rales.  Abdominal:     General: Bowel sounds are normal. There is no distension.     Palpations: Abdomen is soft. There is no mass.     Tenderness: There is no abdominal tenderness.     Comments: Abdomen is completely soft and nontender, in the right inguinal region there is some draining purulence but no signs of masses or swelling, minimal tenderness in that area.  No crepitance or emphysema  Musculoskeletal:        General: No tenderness. Normal range of motion.     Cervical back: Normal range of motion and neck supple.  Lymphadenopathy:     Cervical: No cervical adenopathy.  Skin:    General: Skin is warm and dry.     Findings: Erythema present. No rash.  Neurological:     Mental Status: He is alert.     Coordination: Coordination normal.  Psychiatric:        Behavior: Behavior normal.     ED Results / Procedures / Treatments   Labs (all labs ordered are listed, but only abnormal results are displayed) Labs Reviewed  CBC WITH DIFFERENTIAL/PLATELET - Abnormal; Notable for the following components:      Result Value   Hemoglobin 12.4 (*)    All other components within normal limits  BASIC METABOLIC PANEL  MAGNESIUM  CBC WITH DIFFERENTIAL/PLATELET  CBC WITH DIFFERENTIAL/PLATELET  I-STAT CG4 LACTIC ACID, ED  TROPONIN I (HIGH SENSITIVITY)    EKG None  Radiology CT ABDOMEN PELVIS W CONTRAST Result Date: 03/31/2023 CLINICAL DATA:  Abdominal  abscess (Ped 0-17y) prior abscess R inguinal and psoas. EXAM: CT ABDOMEN AND PELVIS WITH CONTRAST TECHNIQUE: Multidetector CT imaging of the abdomen and pelvis was performed using the standard protocol following bolus administration of intravenous contrast. RADIATION DOSE REDUCTION: This exam was performed according to the departmental dose-optimization program which includes automated exposure control, adjustment of the mA and/or kV according to patient size and/or use of iterative reconstruction technique. CONTRAST:  75mL OMNIPAQUE IOHEXOL 350 MG/ML SOLN COMPARISON:  CT scan pelvis from 01/22/2023. FINDINGS: Lower chest: The lung bases  are clear. No pleural effusion. The heart is normal in size. No pericardial effusion. Hepatobiliary: The liver is normal in size. Non-cirrhotic configuration. No suspicious mass. These is mild diffuse hepatic steatosis. No intrahepatic or extrahepatic bile duct dilation. No calcified gallstones. Normal gallbladder wall thickness. No pericholecystic inflammatory changes. Pancreas: Unremarkable. No pancreatic ductal dilatation or surrounding inflammatory changes. Spleen: Within normal limits. No focal lesion. Adrenals/Urinary Tract: Adrenal glands are unremarkable. No suspicious renal mass. There is a 9 x 9 mm simple cyst in the right kidney upper pole. No hydronephrosis. No renal or ureteric calculi. Redemonstration of bladder wall irregularity and mucosal hyperattenuation, essentially similar to the prior study. Correlate clinically and with urinalysis to exclude superimposed cystitis. No focal urinary bladder mass or bladder calculi. Stomach/Bowel: No disproportionate dilation of the small or large bowel loops. No evidence of abnormal bowel wall thickening or inflammatory changes. The appendix is unremarkable. Vascular/Lymphatic: No ascites or pneumoperitoneum. No abdominal or pelvic lymphadenopathy, by size criteria. There is intramural/intraluminal thrombus predominantly in the  infrarenal abdominal aorta. There is a vascular stent in the right common iliac artery extending into the proximal portion of the right external iliac artery. Right external iliac artery, right common femoral artery and visualized right superficial femoral arteries are completely thrombosed. 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 There is reconstitution of the left external/internal iliac arteries. No aneurysmal dilation of the major abdominal arteries. There are mild peripheral atherosclerotic vascular calcifications of the aorta and its major branches. Reproductive: Normal size prostate. Symmetric seminal vesicles. Other: Redemonstration of linear hyperattenuating areas in the bilateral inguinal region, which may represent postsurgical changes/hernia mesh. There is an irregular hyperattenuating wall collection in the right paramedian inferior anterior abdominal wall which extends up to the skin surface, essentially similar to the prior study. The collection also extends in the left lower quadrant in the space of Retzius. Overall, the size of the complex collection/post hernia repair/hernia mesh is slightly smaller than the prior exam from 01/22/2023. Musculoskeletal: No suspicious osseous lesions. There are mild multilevel degenerative changes in the visualized spine. IMPRESSION: 1. Redemonstration of patient's known chronic collection in the bilateral inguinal region with extension up to the skin surface in the right paramedian anterior abdominal wall. There are linear hyperattenuating structures which may represent hernia mesh. Overall, the size of collection is slightly smaller than the prior exam from 01/22/2023. 2. 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 3. Redemonstration of irregular urinary bladder wall thickening and mucosal hyperattenuation, similar to the prior study.  Correlate clinically and with urinalysis to exclude superimposed cystitis. 4. Multiple other nonacute observations, as described above. Electronically Signed   By: Jules Schick M.D.   On: 03/31/2023 14:30   DG Chest 1 View Result Date: 03/31/2023 CLINICAL DATA:  Weakness. EXAM: CHEST  1 VIEW COMPARISON:  Chest radiograph dated Sep 01, 2021. FINDINGS: The heart size and mediastinal contours are within normal limits. Both lungs are clear. No pleural effusion or pneumothorax. No acute osseous abnormality. IMPRESSION: No acute cardiopulmonary findings. Electronically Signed   By: Hart Robinsons M.D.   On: 03/31/2023 12:47    Procedures Procedures    Medications Ordered in ED Medications  cephALEXin (KEFLEX) capsule 500 mg (has no administration in time range)  doxycycline (VIBRA-TABS) tablet 100 mg (has no administration in time range)  iohexol (OMNIPAQUE) 350 MG/ML injection 75 mL (75 mLs Intravenous Contrast Given 03/31/23  1348)    ED Course/ Medical Decision Making/ A&P                                 Medical Decision Making Amount and/or Complexity of Data Reviewed Labs: ordered. Radiology: ordered. ECG/medicine tests: ordered.  Risk Prescription drug management.   Patient presents with some right inguinal swelling which is minimal, there is purulence which she states has been somewhat chronic, I am concerned because of his history of recurrent infections and psoas abscess with CT will be ordered to confirm that this is getting better or investigate whether it is getting worse.  As to the lightheadedness this morning the patient is not symptomatic at this time, his EKG is unremarkable, we will order some labs and a CT scan, the patient is agreeable to this plan.   Labs: I personally viewed and interpreted labs which show  Imaging: I personally viewed and interpreted the CT scan of the abdomen and pelvis which shows chronic fluid collection seems to be getting better over  time, likely surrounding mesh I agree with radiologist interpretation.  Cardiac monitoring: Normal sinus rhythm  EKG: Performed on March 31, 2023 at 11:45 AM shows normal sinus rhythm with slight left axis deviation, intervals which are unremarkable and normal, normal ST segments, normal T waves, otherwise unremarkable EKG. No signs of left ventricular hypertrophy  ED course: Patient given antibiotics, prescriptions for same, vitals normal, no findings of concern for lightheadedness, abdomen is essentially nontender, chronic draining fluid collection, stable for discharge and follow-up with general surgery   I have discussed with the patient at the bedside the results, and the meaning of these results.  They have had opportunity to ask questions,  expressed their understanding to the need for follow-up with primary care physician        Final Clinical Impression(s) / ED Diagnoses Final diagnoses:  Dizziness  Abscess, groin    Rx / DC Orders ED Discharge Orders     None         Eber Hong, MD 03/31/23 1502

## 2023-03-31 NOTE — ED Notes (Signed)
Attempted IV placement x 3 with no success. IV team consult placed.

## 2023-03-31 NOTE — ED Triage Notes (Signed)
Pt BIB EMS due to dizziness and skin infection by his groin. HX of stroke. C/O abscess that leaks and smells. Axox4.

## 2023-03-31 NOTE — ED Notes (Signed)
CBC clotted per lab. IV team redrew.

## 2023-04-02 ENCOUNTER — Other Ambulatory Visit: Payer: Self-pay

## 2023-04-02 ENCOUNTER — Ambulatory Visit (HOSPITAL_COMMUNITY)
Admission: RE | Admit: 2023-04-02 | Discharge: 2023-04-02 | Disposition: A | Discharge status: Home / Self Care | Payer: 59 | Source: Ambulatory Visit | Attending: General Surgery | Admitting: General Surgery

## 2023-04-02 ENCOUNTER — Encounter (HOSPITAL_COMMUNITY): Payer: Self-pay

## 2023-04-02 DIAGNOSIS — I745 Embolism and thrombosis of iliac artery: Secondary | ICD-10-CM | POA: Insufficient documentation

## 2023-04-02 DIAGNOSIS — R188 Other ascites: Secondary | ICD-10-CM | POA: Diagnosis not present

## 2023-04-02 DIAGNOSIS — I739 Peripheral vascular disease, unspecified: Secondary | ICD-10-CM | POA: Insufficient documentation

## 2023-04-02 DIAGNOSIS — L02214 Cutaneous abscess of groin: Secondary | ICD-10-CM | POA: Diagnosis present

## 2023-04-02 DIAGNOSIS — K6812 Psoas muscle abscess: Secondary | ICD-10-CM | POA: Diagnosis present

## 2023-04-02 DIAGNOSIS — Z79899 Other long term (current) drug therapy: Secondary | ICD-10-CM | POA: Insufficient documentation

## 2023-04-02 DIAGNOSIS — I82522 Chronic embolism and thrombosis of left iliac vein: Secondary | ICD-10-CM | POA: Insufficient documentation

## 2023-04-02 DIAGNOSIS — I10 Essential (primary) hypertension: Secondary | ICD-10-CM | POA: Insufficient documentation

## 2023-04-02 DIAGNOSIS — Z89511 Acquired absence of right leg below knee: Secondary | ICD-10-CM | POA: Insufficient documentation

## 2023-04-02 DIAGNOSIS — F17210 Nicotine dependence, cigarettes, uncomplicated: Secondary | ICD-10-CM | POA: Insufficient documentation

## 2023-04-02 DIAGNOSIS — G40909 Epilepsy, unspecified, not intractable, without status epilepticus: Secondary | ICD-10-CM | POA: Diagnosis not present

## 2023-04-02 DIAGNOSIS — E785 Hyperlipidemia, unspecified: Secondary | ICD-10-CM | POA: Insufficient documentation

## 2023-04-02 LAB — CBC
HCT: 39.9 % (ref 39.0–52.0)
Hemoglobin: 12.6 g/dL — ABNORMAL LOW (ref 13.0–17.0)
MCH: 27.9 pg (ref 26.0–34.0)
MCHC: 31.6 g/dL (ref 30.0–36.0)
MCV: 88.3 fL (ref 80.0–100.0)
Platelets: 248 10*3/uL (ref 150–400)
RBC: 4.52 MIL/uL (ref 4.22–5.81)
RDW: 15.4 % (ref 11.5–15.5)
WBC: 5.6 10*3/uL (ref 4.0–10.5)
nRBC: 0 % (ref 0.0–0.2)

## 2023-04-02 LAB — PROTIME-INR
INR: 1.1 (ref 0.8–1.2)
Prothrombin Time: 14 s (ref 11.4–15.2)

## 2023-04-02 MED ORDER — SODIUM CHLORIDE 0.9% FLUSH: 10.0000 mL | Freq: Two times a day (BID) | INTRAVENOUS | Status: DC

## 2023-04-02 MED ORDER — MIDAZOLAM HCL 2 MG/2ML IJ SOLN
INTRAMUSCULAR | Status: AC | PRN
  Administered 2023-04-02: 1 mg via INTRAVENOUS

## 2023-04-02 MED ORDER — LIDOCAINE HCL 1 % IJ SOLN
10.0000 mL | Freq: Once | INTRAMUSCULAR | Status: AC
  Administered 2023-04-02: 10 mL via INTRADERMAL

## 2023-04-02 MED ORDER — MIDAZOLAM HCL 2 MG/2ML IJ SOLN
INTRAMUSCULAR | Status: AC
  Filled 2023-04-02: qty 2

## 2023-04-02 MED ORDER — FENTANYL CITRATE (PF) 100 MCG/2ML IJ SOLN
INTRAMUSCULAR | Status: AC | PRN
  Administered 2023-04-02: 25 ug via INTRAVENOUS

## 2023-04-02 MED ORDER — FENTANYL CITRATE (PF) 100 MCG/2ML IJ SOLN
INTRAMUSCULAR | Status: AC
  Filled 2023-04-02: qty 2

## 2023-04-02 NOTE — Procedures (Signed)
Interventional Radiology Procedure Note  Procedure: CT guided aspiration of abdominal wall fluid in right pelvis  Complications: None  Estimated Blood Loss: None  Findings: 18 G trocar needle aspiration of residual area of fluid just deep to right lower pelvic abdominal wall musculature yielded 1 mL of thick, bloody fluid. Sent for culture analysis.  Jodi Marble. Fredia Sorrow, M.D Pager:  [phone]

## 2023-04-02 NOTE — H&P (Signed)
Chief Complaint: Patient was seen in consultation today for right inguinal abscess aspiration.  Referring Provider(s): Dr. Emelia Loron, MD  Supervising Physician: Irish Lack  Patient Status: [facility] - Out-pt  Patient is Full Code  History of Present Illness: Dominic Carter is a 67 y.o. male with a PMHx of HTN, HLD, PAD with RLE amputation, chronic R MCA stroke, hepatitis, EtOH and tobacco abuse, and seizure disorder.  Patient is followed by General Surgery. He was hospitalized on 01/2022 with right inguinal abscess/ intrapelvic or retroperitoneal abscess on the psoas muscle. A follow-up CT Pelvis on 01/22/23 was notable for a residual peripherally enhancing extraperitoneal collection that drains to the skin surface just medial to the right inguinal canal, measuring 16 x 24 x 54 mm. The abnormal enhancement extends superiorly along the right iliacus muscle.   Patient presented to ED on 12/24 with transient dizziness, and discharged stable.  CT A/P w/ contrast on 12/24: IMPRESSION: 1. Redemonstration of patient's known chronic collection in the bilateral inguinal region with extension up to the skin surface in the right paramedian anterior abdominal wall. There are linear hyperattenuating structures which may represent hernia mesh. Overall, the size of collection is slightly smaller than the prior exam from 01/22/2023. 2. 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  Interventional Radiology was requested for right inguinal abscess aspiration. Request was reviewed and approved by Dr. Lowella Dandy. Patient is scheduled for same in IR today.  All labs and medications are within acceptable parameters. No pertinent allergies. Patient has been NPO since midnight.    Currently without any significant complaints. Patient alert and laying in bed,calm. Denies any fevers, headache, chest  pain, SOB, cough, abdominal pain, nausea, vomiting or bleeding.      Past Medical History:  Diagnosis Date   DVT (deep venous thrombosis) (HCC)    Dyslipidemia    ETOH abuse    Hx of BKA, right (HCC)    Hypertension    Seizure disorder (HCC)     Past Surgical History:  Procedure Laterality Date   BELOW KNEE LEG AMPUTATION Right    IRRIGATION AND DEBRIDEMENT ABSCESS Right 01/06/2022   Procedure: IRRIGATION AND DEBRIDEMENT ABSCESS;  Surgeon: Emelia Loron, MD;  Location: [facility] OR;  Service: General;  Laterality: Right;    Allergies: Sulfa antibiotics, Castor oil, and Other  Medications: Prior to Admission medications   Medication Sig Start Date End Date Taking? Authorizing Provider  amLODipine (NORVASC) 10 MG tablet Take 10 mg by mouth daily. 08/18/19   [provider]  amoxicillin-clavulanate (AUGMENTIN) 875-125 MG tablet Take 1 tablet by mouth 2 (two) times daily. 12/02/22   Gardiner Barefoot, MD  Aspirin 81 MG CAPS 1 a day 10/08/22   [provider]  cephALEXin (KEFLEX) 500 MG capsule Take 1 capsule (500 mg total) by mouth 4 (four) times daily for 7 days. 03/31/23 04/07/23  Loetta Rough, MD  doxycycline (VIBRAMYCIN) 100 MG capsule Take 1 capsule (100 mg total) by mouth 2 (two) times daily for 7 days. 03/31/23 04/07/23  Loetta Rough, MD  famotidine (PEPCID) 40 MG tablet Take 40 mg by mouth daily. Patient not taking: Reported on 12/02/2022 04/25/22   [provider]  folic acid (FOLVITE) 1 MG tablet Take 1 tablet (1 mg total) by mouth daily. Patient not taking: Reported on 08/03/2022 01/10/22   Alberteen Sam, MD  levETIRAcetam (KEPPRA) 500 MG tablet  Take 1 tablet (500 mg total) by mouth 2 (two) times daily. Patient not taking: Reported on 08/03/2022 01/10/22   Alberteen Sam, MD  Multiple Vitamin (MULTIVITAMIN WITH MINERALS) TABS tablet Take 1 tablet by mouth daily. Patient not taking: Reported on 08/10/2021 09/05/20   Karsten Ro, MD   ondansetron (ZOFRAN) 4 MG tablet Take 1 tablet (4 mg total) by mouth every 6 (six) hours as needed for nausea. Patient not taking: Reported on 08/03/2022 01/10/22   Alberteen Sam, MD  oxyCODONE (OXY IR/ROXICODONE) 5 MG immediate release tablet Take 1 tablet (5 mg total) by mouth every 6 (six) hours as needed for breakthrough pain. Patient not taking: Reported on 08/03/2022 01/08/22   Jacinto Halim, PA-C  pravastatin (PRAVACHOL) 20 MG tablet Take 1 tablet (20 mg total) by mouth daily. 01/10/22   Danford, Earl Lites, MD  thiamine (VITAMIN B1) 100 MG tablet Take 1 tablet (100 mg total) by mouth daily. Patient not taking: Reported on 08/03/2022 01/11/22   Alberteen Sam, MD     Family History  Problem Relation Age of Onset   Hypertension Mother    Hypertension Sister    Hypertension Brother    Hypertension Maternal Grandmother    Hypertension Maternal Grandfather     Social History   Socioeconomic History   Marital status: Single    Spouse name: Not on file   Number of children: Not on file   Years of education: Not on file   Highest education level: Not on file  Occupational History   Not on file  Tobacco Use   Smoking status: Every Day    Current packs/day: 1.00    Types: Cigarettes   Smokeless tobacco: Never  Vaping Use   Vaping status: Never Used  Substance and Sexual Activity   Alcohol use: Not Currently    Comment: Last beer 01/2022   Drug use: No   Sexual activity: Not on file  Other Topics Concern   Not on file  Social History Narrative   Not on file   Social Drivers of Health   Financial Resource Strain: Medium Risk (04/04/2022)   Received from [facility], [facility]   Overall Financial Resource Strain (CARDIA)    Difficulty of Paying Living Expenses: Somewhat hard  Food Insecurity: Food Insecurity Present (04/04/2022)   Received from [facility], [facility]   Hunger Vital Sign    Worried About Running Out of Food in the Last  Year: Sometimes true    Ran Out of Food in the Last Year: Sometimes true  Transportation Needs: No Transportation Needs (04/04/2022)   Received from [employer], [facility]   PRAPARE - Transportation    Lack of Transportation (Medical): No    Lack of Transportation (Non-Medical): No  Physical Activity: Unknown (04/04/2022)   Received from [facility], [facility]   Exercise Vital Sign    Days of Exercise per Week: 0 days    Minutes of Exercise per Session: Not on file  Stress: No Stress Concern Present (04/04/2022)   Received from [facility], [facility] Occupational Health - Occupational Stress Questionnaire    Feeling of Stress : Not at all  Social Connections: Socially Integrated (04/04/2022)   Received from [facility], [facility]   Social Network    How would you rate your social network (family, work, friends)?: Good participation with social networks     Review of Systems: A 12 point ROS discussed and  pertinent positives are indicated in the HPI above.  All other systems are negative.  Review of Systems  Constitutional:  Negative for activity change, chills, fatigue, fever and unexpected weight change.  Respiratory:  Negative for cough, chest tightness, shortness of breath and wheezing.   Cardiovascular:  Negative for chest pain.  Gastrointestinal:  Negative for abdominal pain.  Skin:  Negative for color change.  Psychiatric/Behavioral:  Negative for behavioral problems and confusion.     Vital Signs: Pulse 70   Temp 97.7 F (36.5 C) (Oral)   Resp 16   Ht 5\' 11"  (1.803 m)   Wt 140 lb (63.5 kg)   BMI 19.53 kg/m   Advance Care Plan: The advanced care place/surrogate decision maker was discussed at the time of visit and the patient did not wish to discuss or was not able to name a surrogate decision maker or provide an advance care plan.  Physical Exam Vitals reviewed.  Constitutional:      General: He is not in acute  distress.    Appearance: Normal appearance.  HENT:     Mouth/Throat:     Mouth: Mucous membranes are moist.  Cardiovascular:     Rate and Rhythm: Normal rate and regular rhythm.     Pulses: Normal pulses.     Heart sounds: No murmur heard. Pulmonary:     Effort: Pulmonary effort is normal. No respiratory distress.     Breath sounds: Normal breath sounds.  Abdominal:     General: Abdomen is flat.     Tenderness: There is no abdominal tenderness.  Musculoskeletal:        General: Normal range of motion.  Skin:    General: Skin is warm and dry.  Neurological:     Mental Status: He is alert and oriented to person, place, and time.  Psychiatric:        Mood and Affect: Mood normal.        Behavior: Behavior normal.        Thought Content: Thought content normal.        Judgment: Judgment normal.     Imaging: CT ABDOMEN PELVIS W CONTRAST Result Date: 03/31/2023 CLINICAL DATA:  Abdominal abscess (Ped 0-17y) prior abscess R inguinal and psoas. EXAM: CT ABDOMEN AND PELVIS WITH CONTRAST TECHNIQUE: Multidetector CT imaging of the abdomen and pelvis was performed using the standard protocol following bolus administration of intravenous contrast. RADIATION DOSE REDUCTION: This exam was performed according to the departmental dose-optimization program which includes automated exposure control, adjustment of the mA and/or kV according to patient size and/or use of iterative reconstruction technique. CONTRAST:  75mL OMNIPAQUE IOHEXOL 350 MG/ML SOLN COMPARISON:  CT scan pelvis from 01/22/2023. FINDINGS: Lower chest: The lung bases are clear. No pleural effusion. The heart is normal in size. No pericardial effusion. Hepatobiliary: The liver is normal in size. Non-cirrhotic configuration. No suspicious mass. These is mild diffuse hepatic steatosis. No intrahepatic or extrahepatic bile duct dilation. No calcified gallstones. Normal gallbladder wall thickness. No pericholecystic inflammatory changes.  Pancreas: Unremarkable. No pancreatic ductal dilatation or surrounding inflammatory changes. Spleen: Within normal limits. No focal lesion. Adrenals/Urinary Tract: Adrenal glands are unremarkable. No suspicious renal mass. There is a 9 x 9 mm simple cyst in the right kidney upper pole. No hydronephrosis. No renal or ureteric calculi. Redemonstration of bladder wall irregularity and mucosal hyperattenuation, essentially similar to the prior study. Correlate clinically and with urinalysis to exclude superimposed cystitis. No focal urinary bladder mass or bladder calculi.  Stomach/Bowel: No disproportionate dilation of the small or large bowel loops. No evidence of abnormal bowel wall thickening or inflammatory changes. The appendix is unremarkable. Vascular/Lymphatic: No ascites or pneumoperitoneum. No abdominal or pelvic lymphadenopathy, by size criteria. There is intramural/intraluminal thrombus predominantly in the infrarenal abdominal aorta. There is a vascular stent in the right common iliac artery extending into the proximal portion of the right external iliac artery. Right external iliac artery, right common femoral artery and visualized right superficial femoral arteries are completely thrombosed. 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 There is reconstitution of the left external/internal iliac arteries. No aneurysmal dilation of the major abdominal arteries. There are mild peripheral atherosclerotic vascular calcifications of the aorta and its major branches. Reproductive: Normal size prostate. Symmetric seminal vesicles. Other: Redemonstration of linear hyperattenuating areas in the bilateral inguinal region, which may represent postsurgical changes/hernia mesh. There is an irregular hyperattenuating wall collection in the right paramedian inferior anterior abdominal wall which extends up to the skin surface, essentially similar to the prior study. The collection also extends  in the left lower quadrant in the space of Retzius. Overall, the size of the complex collection/post hernia repair/hernia mesh is slightly smaller than the prior exam from 01/22/2023. Musculoskeletal: No suspicious osseous lesions. There are mild multilevel degenerative changes in the visualized spine. IMPRESSION: 1. Redemonstration of patient's known chronic collection in the bilateral inguinal region with extension up to the skin surface in the right paramedian anterior abdominal wall. There are linear hyperattenuating structures which may represent hernia mesh. Overall, the size of collection is slightly smaller than the prior exam from 01/22/2023. 2. 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 3. Redemonstration of irregular urinary bladder wall thickening and mucosal hyperattenuation, similar to the prior study. Correlate clinically and with urinalysis to exclude superimposed cystitis. 4. Multiple other nonacute observations, as described above. Electronically Signed   By: Jules Schick M.D.   On: 03/31/2023 14:30   DG Chest 1 View Result Date: 03/31/2023 CLINICAL DATA:  Weakness. EXAM: CHEST  1 VIEW COMPARISON:  Chest radiograph dated Sep 01, 2021. FINDINGS: The heart size and mediastinal contours are within normal limits. Both lungs are clear. No pleural effusion or pneumothorax. No acute osseous abnormality. IMPRESSION: No acute cardiopulmonary findings. Electronically Signed   By: Hart Robinsons M.D.   On: 03/31/2023 12:47    Labs:  CBC: Recent Labs    08/03/22 1415 03/31/23 1313 04/02/23 0852  WBC 6.6 5.5 5.6  HGB 13.2 12.4* 12.6*  HCT 42.2 39.5 39.9  PLT 201 247 248    COAGS: No results for input(s): "INR", "APTT" in the last 8760 hours.  BMP: Recent Labs    08/03/22 1415 03/31/23 1145  NA 136 139  K 4.4 5.1  CL 103 104  CO2 24 26  GLUCOSE 93 97  BUN 7* 9  CALCIUM 9.0  9.0  CREATININE 0.90 0.96  GFRNONAA >60 >60    LIVER FUNCTION TESTS: No results for input(s): "BILITOT", "AST", "ALT", "ALKPHOS", "PROT", "ALBUMIN" in the last 8760 hours.  TUMOR MARKERS: No results for input(s): "AFPTM", "CEA", "CA199", "CHROMGRNA" in the last 8760 hours.  Assessment and Plan:  Patient is followed by General Surgery. He was hospitalized on 01/2022 with right inguinal abscess/ intrapelvic or retroperitoneal abscess on the psoas muscle. A follow-up CT Pelvis on 01/22/23 was notable for a residual peripherally enhancing extraperitoneal collection that drains  to the skin surface just medial to the right inguinal canal. A repeat CT A/P w/ contrast on 12/24 demonstrated patient's known chronic collection in the bilateral inguinal region with extension up to the skin surface in the right paramedian anterior abdominal wall. There are linear hyperattenuating structures which may represent hernia mesh. Overall, the size of collection is slightly smaller than the prior exam from 01/22/2023.  Patient presents for scheduled right inguinal abscess aspiration in IR today.  Risks and benefits of right inguinal abscess aspiration was discussed with the patient and/or patient's family including, but not limited to bleeding, infection, damage to adjacent structures or low yield requiring additional tests.  All of the questions were answered and there is agreement to proceed.  Consent signed and in chart.   Thank you for allowing our service to participate in DOROTEO SYDNEY 's care.  Electronically Signed: Sable Feil, PA-C   04/02/2023, 9:12 AM      I spent a total of  30 Minutes   in face to face in clinical consultation, greater than 50% of which was counseling/coordinating care for right inguinal abscess aspiration.

## 2023-04-06 LAB — AEROBIC/ANAEROBIC CULTURE W GRAM STAIN (SURGICAL/DEEP WOUND): Special Requests: NORMAL

## 2023-05-30 ENCOUNTER — Emergency Department (HOSPITAL_COMMUNITY)
Admission: EM | Admit: 2023-05-30 | Discharge: 2023-05-30 | Disposition: A | Discharge status: Home / Self Care | Payer: 59 | Attending: Emergency Medicine | Admitting: Emergency Medicine

## 2023-05-30 ENCOUNTER — Other Ambulatory Visit: Payer: Self-pay

## 2023-05-30 ENCOUNTER — Encounter (HOSPITAL_COMMUNITY): Payer: Self-pay

## 2023-05-30 DIAGNOSIS — E878 Other disorders of electrolyte and fluid balance, not elsewhere classified: Secondary | ICD-10-CM | POA: Insufficient documentation

## 2023-05-30 DIAGNOSIS — Z7982 Long term (current) use of aspirin: Secondary | ICD-10-CM | POA: Insufficient documentation

## 2023-05-30 DIAGNOSIS — Z79899 Other long term (current) drug therapy: Secondary | ICD-10-CM | POA: Insufficient documentation

## 2023-05-30 DIAGNOSIS — E876 Hypokalemia: Secondary | ICD-10-CM | POA: Insufficient documentation

## 2023-05-30 DIAGNOSIS — R569 Unspecified convulsions: Secondary | ICD-10-CM | POA: Diagnosis present

## 2023-05-30 LAB — COMPREHENSIVE METABOLIC PANEL
ALT: 14 U/L (ref 0–44)
AST: 17 U/L (ref 15–41)
Albumin: 2.6 g/dL — ABNORMAL LOW (ref 3.5–5.0)
Alkaline Phosphatase: 55 U/L (ref 38–126)
Anion gap: 10 (ref 5–15)
BUN: 7 mg/dL — ABNORMAL LOW (ref 8–23)
CO2: 17 mmol/L — ABNORMAL LOW (ref 22–32)
Calcium: 6.4 mg/dL — CL (ref 8.9–10.3)
Chloride: 115 mmol/L — ABNORMAL HIGH (ref 98–111)
Creatinine, Ser: 0.62 mg/dL (ref 0.61–1.24)
GFR, Estimated: >60 mL/min (ref >60)
Glucose, Bld: 89 mg/dL (ref 70–99)
Potassium: 3.1 mmol/L — ABNORMAL LOW (ref 3.5–5.1)
Sodium: 142 mmol/L (ref 135–145)
Total Bilirubin: 0.5 mg/dL (ref 0.0–1.2)
Total Protein: 6.1 g/dL — ABNORMAL LOW (ref 6.5–8.1)

## 2023-05-30 LAB — CBC WITH DIFFERENTIAL/PLATELET
Abs Immature Granulocytes: 0.04 10*3/uL (ref 0.00–0.07)
Basophils Absolute: 0.1 10*3/uL (ref 0.0–0.1)
Basophils Relative: 1 %
Eosinophils Absolute: 0.7 10*3/uL — ABNORMAL HIGH (ref 0.0–0.5)
Eosinophils Relative: 9 %
HCT: 34 % — ABNORMAL LOW (ref 39.0–52.0)
Hemoglobin: 10 g/dL — ABNORMAL LOW (ref 13.0–17.0)
Immature Granulocytes: 1 %
Lymphocytes Relative: 9 %
Lymphs Abs: 0.7 10*3/uL (ref 0.7–4.0)
MCH: 28 pg (ref 26.0–34.0)
MCHC: 29.4 g/dL — ABNORMAL LOW (ref 30.0–36.0)
MCV: 95.2 fL (ref 80.0–100.0)
Monocytes Absolute: 1 10*3/uL (ref 0.1–1.0)
Monocytes Relative: 13 %
Neutro Abs: 5.1 10*3/uL (ref 1.7–7.7)
Neutrophils Relative %: 67 %
Platelets: 146 10*3/uL — ABNORMAL LOW (ref 150–400)
RBC: 3.57 MIL/uL — ABNORMAL LOW (ref 4.22–5.81)
RDW: 17.1 % — ABNORMAL HIGH (ref 11.5–15.5)
WBC: 7.6 10*3/uL (ref 4.0–10.5)
nRBC: 0 % (ref 0.0–0.2)

## 2023-05-30 LAB — ETHANOL: Alcohol, Ethyl (B): <10 mg/dL (ref <10)

## 2023-05-30 LAB — MAGNESIUM: Magnesium: 1.7 mg/dL (ref 1.7–2.4)

## 2023-05-30 LAB — LIPASE, BLOOD: Lipase: 24 U/L (ref 11–51)

## 2023-05-30 MED ORDER — POTASSIUM CHLORIDE CRYS ER 20 MEQ PO TBCR
40.0000 meq | EXTENDED_RELEASE_TABLET | Freq: Once | ORAL | Status: AC
  Administered 2023-05-30: 40 meq via ORAL
  Filled 2023-05-30: qty 2

## 2023-05-30 MED ORDER — SODIUM CHLORIDE 0.9 % IV BOLUS
1000.0000 mL | Freq: Once | INTRAVENOUS | Status: AC
  Administered 2023-05-30: 1000 mL via INTRAVENOUS

## 2023-05-30 MED ORDER — CALCIUM GLUCONATE-NACL 1-0.675 GM/50ML-% IV SOLN
1.0000 g | Freq: Once | INTRAVENOUS | Status: AC
  Administered 2023-05-30: 1000 mg via INTRAVENOUS
  Filled 2023-05-30: qty 50

## 2023-05-30 NOTE — Discharge Instructions (Signed)
 I would stop consuming alcohol in excess as this can cause a lot of derangements in your body.  I would like for you to follow-up with your primary care doctor for further evaluation.  You may return to the emergency department for any worsening symptoms.

## 2023-05-30 NOTE — ED Notes (Signed)
 Pt niece attempted to be contacted to assist with pt transport home. Niece did not answer, and a message was not able to be left.

## 2023-05-30 NOTE — ED Notes (Signed)
 [facility] arrived to transport pt. As [facility] was leaving, pt niece called hospital back. Niece informed that [facility] was transporting pt and niece advised she would meet [facility] at pt house.

## 2023-05-30 NOTE — ED Notes (Signed)
 [facility] called and transport arranged for pt

## 2023-05-30 NOTE — ED Provider Notes (Signed)
 [place] EMERGENCY DEPARTMENT AT [facility] Provider Note   CSN: [id number] Arrival date & time: 05/30/23  1135     History Chief Complaint  Patient presents with   Seizures    Dominic Carter is a 68 y.o. male patient here for possible seizure.  Patient does not recall what happened.  Per EMS note the patient was walking to the bathroom and started having a seizure.  Niece witnessed the seizure.  Patient was postictal and combative on arrival.  EMS note also states the patient stopped drinking 2 weeks ago and has had seizures from alcohol withdrawal in the past.  Patient states that he had some to drink yesterday.  He typically drinks two 40 ounce beers per day.  Denies pain anywhere.  Has no other complaints.   Seizures      Home Medications Prior to Admission medications   Medication Sig Start Date End Date Taking? Authorizing Provider  amLODipine (NORVASC) 10 MG tablet Take 10 mg by mouth daily. 08/18/19   [provider]  amoxicillin-clavulanate (AUGMENTIN) 875-125 MG tablet Take 1 tablet by mouth 2 (two) times daily. 12/02/22   Gardiner Barefoot, MD  Aspirin 81 MG CAPS 1 a day 10/08/22   [provider]  famotidine (PEPCID) 40 MG tablet Take 40 mg by mouth daily. Patient not taking: Reported on 12/02/2022 04/25/22   [provider]  folic acid (FOLVITE) 1 MG tablet Take 1 tablet (1 mg total) by mouth daily. Patient not taking: Reported on 08/03/2022 01/10/22   Alberteen Sam, MD  levETIRAcetam (KEPPRA) 500 MG tablet Take 1 tablet (500 mg total) by mouth 2 (two) times daily. Patient not taking: Reported on 08/03/2022 01/10/22   Alberteen Sam, MD  Multiple Vitamin (MULTIVITAMIN WITH MINERALS) TABS tablet Take 1 tablet by mouth daily. Patient not taking: Reported on 08/10/2021 09/05/20   Karsten Ro, MD  ondansetron (ZOFRAN) 4 MG tablet Take 1 tablet (4 mg total) by mouth every 6 (six) hours as needed for nausea. Patient not  taking: Reported on 08/03/2022 01/10/22   Alberteen Sam, MD  oxyCODONE (OXY IR/ROXICODONE) 5 MG immediate release tablet Take 1 tablet (5 mg total) by mouth every 6 (six) hours as needed for breakthrough pain. Patient not taking: Reported on 08/03/2022 01/08/22   Jacinto Halim, PA-C  pravastatin (PRAVACHOL) 20 MG tablet Take 1 tablet (20 mg total) by mouth daily. 01/10/22   Danford, Earl Lites, MD  thiamine (VITAMIN B1) 100 MG tablet Take 1 tablet (100 mg total) by mouth daily. Patient not taking: Reported on 08/03/2022 01/11/22   Alberteen Sam, MD      Allergies    Sulfa antibiotics, Castor oil, and Other    Review of Systems   Review of Systems  Neurological:  Positive for seizures.  All other systems reviewed and are negative.   Physical Exam Updated Vital Signs BP 136/76   Pulse 85   Temp 98.1 F (36.7 C) (Oral)   Resp 12   SpO2 99%  Physical Exam Vitals and nursing note reviewed.  Constitutional:      General: He is not in acute distress.    Appearance: Normal appearance.  HENT:     Head: Normocephalic and atraumatic.     Mouth/Throat:     Comments: Mouth appears normal.  No abrasions or lacerations to the tongue. Eyes:     General:        Right eye: No discharge.  Left eye: No discharge.  Cardiovascular:     Comments: Regular rate and rhythm.  S1/S2 are distinct without any evidence of murmur, rubs, or gallops.  Radial pulses are 2+ bilaterally.  Dorsalis pedis pulses are 2+ bilaterally.  No evidence of pedal edema. Pulmonary:     Comments: Clear to auscultation bilaterally.  Normal effort.  No respiratory distress.  No evidence of wheezes, rales, or rhonchi heard throughout. Abdominal:     General: Abdomen is flat. Bowel sounds are normal. There is no distension.     Tenderness: There is no abdominal tenderness. There is no guarding or rebound.  Musculoskeletal:        General: Normal range of motion.     Cervical back: Neck supple.   Skin:    General: Skin is warm and dry.     Findings: No rash.  Neurological:     General: No focal deficit present.     Mental Status: He is alert.  Psychiatric:        Mood and Affect: Mood normal.        Behavior: Behavior normal.     ED Results / Procedures / Treatments   Labs (all labs ordered are listed, but only abnormal results are displayed) Labs Reviewed  CBC WITH DIFFERENTIAL/PLATELET - Abnormal; Notable for the following components:      Result Value   RBC 3.57 (*)    Hemoglobin 10.0 (*)    HCT 34.0 (*)    MCHC 29.4 (*)    RDW 17.1 (*)    Platelets 146 (*)    Eosinophils Absolute 0.7 (*)    All other components within normal limits  COMPREHENSIVE METABOLIC PANEL - Abnormal; Notable for the following components:   Potassium 3.1 (*)    Chloride 115 (*)    CO2 17 (*)    BUN 7 (*)    Calcium 6.4 (*)    Total Protein 6.1 (*)    Albumin 2.6 (*)    All other components within normal limits  LIPASE, BLOOD  MAGNESIUM  ETHANOL    EKG EKG Interpretation Date/Time:  Saturday May 30 2023 15:43:21 EST Ventricular Rate:  87 PR Interval:  167 QRS Duration:  85 QT Interval:  424 QTC Calculation: 511 R Axis:   42  Text Interpretation: Sinus rhythm Posterior infarct, old Prolonged QT interval Confirmed by Ernie Avena ([id number]) on 05/30/2023 4:08:22 PM  Radiology No results found.  Procedures Procedures    Medications Ordered in ED Medications  sodium chloride 0.9 % bolus 1,000 mL (1,000 mLs Intravenous Bolus 05/30/23 1304)  calcium gluconate 1 g/ 50 mL sodium chloride IVPB (0 mg Intravenous Stopped 05/30/23 1703)  potassium chloride SA (KLOR-CON M) CR tablet 40 mEq (40 mEq Oral Given 05/30/23 1605)    ED Course/ Medical Decision Making/ A&P Clinical Course as of 05/30/23 1703  Sat May 30, 2023  1538 On reevaluation, patient states he is feeling back to normal.  He does appear to be at his baseline is much more awake.  He is neurologically intact at this  time.  No evidence of asterixis.  I asked the patient about his alcohol use and he states that he does not plan on stopping drinking anytime soon. [CF]  1538 Comprehensive metabolic panel(!!) Significant for hypocalcemia and hypokalemia.  Will replete these here in the emergency room. [CF]  1538 Magnesium Negative.  [CF]  1538 Lipase, blood Negative.  [CF]  1540 CBC with Differential(!) No evidence of leukocytosis.  [  CF]  1656 On repeat evaluation, patient is still at baseline.  He is doing much better and willing to go home. [CF]    Clinical Course User Index [CF] Teressa Lower, PA-C   {   Click here for ABCD2, HEART and other calculators  Medical Decision Making REICE BIENVENUE is a 68 y.o. male patient who presents to the emergency department today for further evaluation of seizure.  I am suspicious this is likely due to to alcohol.  Questionable when the patient last had some to drink.  Will plan to get an ethanol level, get some electrolyte levels, other basic labs.  Will plan to give him some fluids.  Patient answered all my questions appropriately and does not appear to be confused at this time.  As highlighted in ED course, patient is back to baseline.  Patient wanting to go home.  This is reasonable.  Patient has no plans of stopping drinking and I do not feel Librium taper would be beneficial for him at this moment.  Advised him to stop drinking.  Strict return precautions were discussed.  He is safe for discharge.  Amount and/or Complexity of Data Reviewed Labs: ordered. Decision-making details documented in ED Course.  Risk Prescription drug management.    Final Clinical Impression(s) / ED Diagnoses Final diagnoses:  Seizure [facility])  Electrolyte depletion    Rx / DC Orders ED Discharge Orders     None         Teressa Lower, [place] 05/30/23 1703    Rolan Bucco, MD 05/31/23 [phone]

## 2023-05-30 NOTE — ED Triage Notes (Signed)
 Pt BIB EMS from home for seizures. Pt's niece saw him walk to the bathroom and started seizing. Per EMS, pt was postictal and combative upon arrival. Pt quit drinking a couple weeks ago and the last time he stopped, he had seizures as well. Pt c/o pain in the stump of his leg. Pt had a below the left knee amputation.   A&Ox4  158/96 98% RA 20 rr 140 cbg 100 hr

## 2023-06-14 ENCOUNTER — Other Ambulatory Visit: Payer: Self-pay | Admitting: General Surgery

## 2023-07-13 ENCOUNTER — Emergency Department (HOSPITAL_COMMUNITY)

## 2023-07-13 ENCOUNTER — Other Ambulatory Visit: Payer: Self-pay

## 2023-07-13 ENCOUNTER — Emergency Department (HOSPITAL_COMMUNITY)
Admission: EM | Admit: 2023-07-13 | Discharge: 2023-07-14 | Disposition: A | Discharge status: Home / Self Care | Attending: Emergency Medicine | Admitting: Emergency Medicine

## 2023-07-13 ENCOUNTER — Encounter (HOSPITAL_COMMUNITY): Payer: Self-pay | Admitting: Emergency Medicine

## 2023-07-13 DIAGNOSIS — Y908 Blood alcohol level of 240 mg/100 ml or more: Secondary | ICD-10-CM | POA: Diagnosis not present

## 2023-07-13 DIAGNOSIS — S51011A Laceration without foreign body of right elbow, initial encounter: Secondary | ICD-10-CM | POA: Diagnosis present

## 2023-07-13 DIAGNOSIS — S51821A Laceration with foreign body of right forearm, initial encounter: Secondary | ICD-10-CM | POA: Diagnosis not present

## 2023-07-13 DIAGNOSIS — I6782 Cerebral ischemia: Secondary | ICD-10-CM | POA: Insufficient documentation

## 2023-07-13 DIAGNOSIS — Z79899 Other long term (current) drug therapy: Secondary | ICD-10-CM | POA: Insufficient documentation

## 2023-07-13 DIAGNOSIS — Z7982 Long term (current) use of aspirin: Secondary | ICD-10-CM | POA: Insufficient documentation

## 2023-07-13 DIAGNOSIS — M47812 Spondylosis without myelopathy or radiculopathy, cervical region: Secondary | ICD-10-CM | POA: Diagnosis not present

## 2023-07-13 DIAGNOSIS — I10 Essential (primary) hypertension: Secondary | ICD-10-CM | POA: Diagnosis not present

## 2023-07-13 DIAGNOSIS — E871 Hypo-osmolality and hyponatremia: Secondary | ICD-10-CM | POA: Diagnosis not present

## 2023-07-13 DIAGNOSIS — S0990XA Unspecified injury of head, initial encounter: Secondary | ICD-10-CM | POA: Insufficient documentation

## 2023-07-13 DIAGNOSIS — Z89511 Acquired absence of right leg below knee: Secondary | ICD-10-CM | POA: Insufficient documentation

## 2023-07-13 DIAGNOSIS — S41111A Laceration without foreign body of right upper arm, initial encounter: Secondary | ICD-10-CM | POA: Insufficient documentation

## 2023-07-13 DIAGNOSIS — F10920 Alcohol use, unspecified with intoxication, uncomplicated: Secondary | ICD-10-CM

## 2023-07-13 DIAGNOSIS — F10129 Alcohol abuse with intoxication, unspecified: Secondary | ICD-10-CM | POA: Diagnosis not present

## 2023-07-13 LAB — URINALYSIS, ROUTINE W REFLEX MICROSCOPIC
Bacteria, UA: NONE SEEN
Bilirubin Urine: NEGATIVE
Glucose, UA: NEGATIVE mg/dL
Ketones, ur: NEGATIVE mg/dL
Leukocytes,Ua: NEGATIVE
Nitrite: NEGATIVE
Protein, ur: 100 mg/dL — AB
Specific Gravity, Urine: 1.003 — ABNORMAL LOW (ref 1.005–1.030)
pH: 7 (ref 5.0–8.0)

## 2023-07-13 LAB — CK: Total CK: 256 U/L (ref 49–397)

## 2023-07-13 LAB — RAPID URINE DRUG SCREEN, HOSP PERFORMED
Amphetamines: NOT DETECTED
Barbiturates: NOT DETECTED
Benzodiazepines: NOT DETECTED
Cocaine: NOT DETECTED
Opiates: NOT DETECTED
Tetrahydrocannabinol: NOT DETECTED

## 2023-07-13 LAB — COMPREHENSIVE METABOLIC PANEL WITH GFR
ALT: 26 U/L (ref 0–44)
AST: 42 U/L — ABNORMAL HIGH (ref 15–41)
Albumin: 3.4 g/dL — ABNORMAL LOW (ref 3.5–5.0)
Alkaline Phosphatase: 92 U/L (ref 38–126)
Anion gap: 14 (ref 5–15)
BUN: <5 mg/dL — ABNORMAL LOW (ref 8–23)
CO2: 26 mmol/L (ref 22–32)
Calcium: 8.1 mg/dL — ABNORMAL LOW (ref 8.9–10.3)
Chloride: 107 mmol/L (ref 98–111)
Creatinine, Ser: 0.73 mg/dL (ref 0.61–1.24)
GFR, Estimated: >60 mL/min (ref >60)
Glucose, Bld: 92 mg/dL (ref 70–99)
Potassium: 3.8 mmol/L (ref 3.5–5.1)
Sodium: 147 mmol/L — ABNORMAL HIGH (ref 135–145)
Total Bilirubin: 0.4 mg/dL (ref 0.0–1.2)
Total Protein: 8 g/dL (ref 6.5–8.1)

## 2023-07-13 LAB — CBC WITH DIFFERENTIAL/PLATELET
Abs Immature Granulocytes: 0.03 10*3/uL (ref 0.00–0.07)
Basophils Absolute: 0.1 10*3/uL (ref 0.0–0.1)
Basophils Relative: 1 %
Eosinophils Absolute: 0 10*3/uL (ref 0.0–0.5)
Eosinophils Relative: 0 %
HCT: 43.4 % (ref 39.0–52.0)
Hemoglobin: 13.7 g/dL (ref 13.0–17.0)
Immature Granulocytes: 1 %
Lymphocytes Relative: 26 %
Lymphs Abs: 1.5 10*3/uL (ref 0.7–4.0)
MCH: 28.1 pg (ref 26.0–34.0)
MCHC: 31.6 g/dL (ref 30.0–36.0)
MCV: 88.9 fL (ref 80.0–100.0)
Monocytes Absolute: 1.1 10*3/uL — ABNORMAL HIGH (ref 0.1–1.0)
Monocytes Relative: 18 %
Neutro Abs: 3.2 10*3/uL (ref 1.7–7.7)
Neutrophils Relative %: 54 %
Platelets: 193 10*3/uL (ref 150–400)
RBC: 4.88 MIL/uL (ref 4.22–5.81)
RDW: 18.2 % — ABNORMAL HIGH (ref 11.5–15.5)
WBC: 5.9 10*3/uL (ref 4.0–10.5)
nRBC: 0 % (ref 0.0–0.2)

## 2023-07-13 LAB — ETHANOL: Alcohol, Ethyl (B): 381 mg/dL (ref <10)

## 2023-07-13 LAB — MAGNESIUM: Magnesium: 2 mg/dL (ref 1.7–2.4)

## 2023-07-13 MED ORDER — LORAZEPAM 1 MG PO TABS
0.5000 mg | ORAL_TABLET | Freq: Once | ORAL | Status: AC
  Administered 2023-07-13: 0.5 mg via ORAL
  Filled 2023-07-13: qty 1

## 2023-07-13 MED ORDER — BACITRACIN ZINC 500 UNIT/GM EX OINT
TOPICAL_OINTMENT | Freq: Two times a day (BID) | CUTANEOUS | Status: DC
  Filled 2023-07-13: qty 2.7
  Filled 2023-07-13 (×2): qty 0.9

## 2023-07-13 MED ORDER — TETANUS-DIPHTH-ACELL PERTUSSIS 5-2.5-18.5 LF-MCG/0.5 IM SUSY
0.5000 mL | PREFILLED_SYRINGE | Freq: Once | INTRAMUSCULAR | Status: DC
  Filled 2023-07-13: qty 0.5

## 2023-07-13 MED ORDER — HALOPERIDOL LACTATE 5 MG/ML IJ SOLN
5.0000 mg | Freq: Once | INTRAMUSCULAR | Status: DC
  Filled 2023-07-13: qty 1

## 2023-07-13 MED ORDER — LIDOCAINE-EPINEPHRINE (PF) 2 %-1:200000 IJ SOLN
20.0000 mL | Freq: Once | INTRAMUSCULAR | Status: AC
  Administered 2023-07-13: 20 mL
  Filled 2023-07-13: qty 20

## 2023-07-13 MED ORDER — LORAZEPAM 1 MG PO TABS
0.5000 mg | ORAL_TABLET | Freq: Once | ORAL | Status: DC
  Filled 2023-07-13: qty 1

## 2023-07-13 MED ORDER — LEVETIRACETAM 500 MG PO TABS
500.0000 mg | ORAL_TABLET | Freq: Once | ORAL | Status: AC
  Administered 2023-07-13: 500 mg via ORAL
  Filled 2023-07-13: qty 1

## 2023-07-13 NOTE — ED Provider Notes (Addendum)
  EMERGENCY DEPARTMENT AT [facility] Provider Note   CSN: [id number] Arrival date & time: 07/13/23  1239     History  No chief complaint on file.   Dominic Carter is a 68 y.o. 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  Patient states he normally drinks 240 ounce beers daily however had 2 4 Loco's today and fell out of his wheelchair with his right elbow out to catch himself.  Patient denies hitting his head or neck area or LOC.  Patient states his family saw his fall.  Patient dates he has a cut on his right elbow but otherwise has no other injuries.  Patient is unable to give further history due to intoxicated state.  5 caveat: Alcohol intoxication.  Home Medications Prior to Admission medications   Medication Sig Start Date End Date Taking? Authorizing Provider  amLODipine (NORVASC) 10 MG tablet Take 10 mg by mouth daily. 08/18/19   [provider]  amoxicillin-clavulanate (AUGMENTIN) 875-125 MG tablet Take 1 tablet by mouth 2 (two) times daily. 12/02/22   Gardiner Barefoot, MD  Aspirin 81 MG CAPS 1 a day 10/08/22   [provider]  famotidine (PEPCID) 40 MG tablet Take 40 mg by mouth daily. Patient not taking: Reported on 12/02/2022 04/25/22   [provider]  folic acid (FOLVITE) 1 MG tablet Take 1 tablet (1 mg total) by mouth daily. Patient not taking: Reported on 08/03/2022 01/10/22   Alberteen Sam, MD  levETIRAcetam (KEPPRA) 500 MG tablet Take 1 tablet (500 mg total) by mouth 2 (two) times daily. Patient not taking: Reported on 08/03/2022 01/10/22   Alberteen Sam, MD  Multiple Vitamin (MULTIVITAMIN WITH MINERALS) TABS tablet Take 1 tablet by mouth daily. Patient not taking: Reported on 08/10/2021 09/05/20   Karsten Ro, MD  ondansetron (ZOFRAN) 4 MG tablet Take 1 tablet (4 mg total) by mouth every 6  (six) hours as needed for nausea. Patient not taking: Reported on 08/03/2022 01/10/22   Alberteen Sam, MD  oxyCODONE (OXY IR/ROXICODONE) 5 MG immediate release tablet Take 1 tablet (5 mg total) by mouth every 6 (six) hours as needed for breakthrough pain. Patient not taking: Reported on 08/03/2022 01/08/22   Jacinto Halim, PA-C  pravastatin (PRAVACHOL) 20 MG tablet Take 1 tablet (20 mg total) by mouth daily. 01/10/22   Danford, Earl Lites, MD  thiamine (VITAMIN B1) 100 MG tablet Take 1 tablet (100 mg total) by mouth daily. Patient not taking: Reported on 08/03/2022 01/11/22   Alberteen Sam, MD      Allergies    Sulfa antibiotics, Castor oil, and Other    Review of Systems   Review of Systems  Physical Exam Updated Vital Signs BP (!) 152/87 (BP Location: Left Arm)   Pulse 91   Temp 98.2 F (36.8 C) (Oral)   Resp 17   SpO2 100%  Physical Exam Vitals reviewed.  Constitutional:      General: He is not in acute distress.    Comments: Clearly intoxicated  HENT:     Head: Normocephalic and atraumatic.  Eyes:     Extraocular Movements: Extraocular movements intact.     Conjunctiva/sclera: Conjunctivae normal.     Pupils: Pupils are equal, round, and reactive to light.  Cardiovascular:     Rate and Rhythm: Normal rate and regular rhythm.     Pulses:  Normal pulses.     Heart sounds: Normal heart sounds.     Comments: 2+ bilateral radial/dorsalis pedis pulses with regular rate Pulmonary:     Effort: Pulmonary effort is normal. No respiratory distress.     Breath sounds: Normal breath sounds.  Abdominal:     Palpations: Abdomen is soft.     Tenderness: There is no abdominal tenderness. There is no guarding or rebound.  Musculoskeletal:        General: Normal range of motion.     Cervical back: Normal range of motion and neck supple. No tenderness.     Comments: 5 out of 5 bilateral grip strength Right below the knee amputation  Skin:    General: Skin is warm  and dry.     Capillary Refill: Capillary refill takes less than 2 seconds.     Comments: 3 cm laceration noted to right elbow Two 1 cm lacerations, 1 on the right upper arm and one on the right lower arm No bony protrusions No purulent material or surrounding erythema or warmth or fluctuance  Neurological:     Mental Status: He is alert and oriented to person, place, and time.     Comments: Sensation intact in all 4 limbs Able to bear weight on his left leg while holding guard rail as he does not have his prosthetic with him No tremors or tongue fasciculations  Psychiatric:     Comments: Intoxicated     ED Results / Procedures / Treatments   Labs (all labs ordered are listed, but only abnormal results are displayed) Labs Reviewed  CBC WITH DIFFERENTIAL/PLATELET - Abnormal; Notable for the following components:      Result Value   RDW 18.2 (*)    Monocytes Absolute 1.1 (*)    All other components within normal limits  URINALYSIS, ROUTINE W REFLEX MICROSCOPIC - Abnormal; Notable for the following components:   Color, Urine STRAW (*)    Specific Gravity, Urine 1.003 (*)    Hgb urine dipstick SMALL (*)    Protein, ur 100 (*)    All other components within normal limits  ETHANOL - Abnormal; Notable for the following components:   Alcohol, Ethyl (B) 381 (*)    All other components within normal limits  COMPREHENSIVE METABOLIC PANEL WITH GFR - Abnormal; Notable for the following components:   Sodium 147 (*)    BUN <5 (*)    Calcium 8.1 (*)    Albumin 3.4 (*)    AST 42 (*)    All other components within normal limits  RAPID URINE DRUG SCREEN, HOSP PERFORMED  MAGNESIUM  CK  LEVETIRACETAM LEVEL  CBG MONITORING, ED    EKG EKG Interpretation Date/Time:  Monday July 13 2023 14:35:58 EDT Ventricular Rate:  89 PR Interval:  124 QRS Duration:  96 QT Interval:  398 QTC Calculation: 484 R Axis:   -16  Text Interpretation: Normal sinus rhythm Minimal voltage criteria for LVH,  may be normal variant ( Sokolow-Lyon ) Septal infarct , age undetermined Abnormal ECG Confirmed by Gloris Manchester [id number]) on 07/13/2023 3:23:24 PM  Radiology CT Head Wo Contrast Result Date: 07/13/2023 CLINICAL DATA:  Head trauma fall EXAM: CT HEAD WITHOUT CONTRAST CT CERVICAL SPINE WITHOUT CONTRAST TECHNIQUE: Multidetector CT imaging of the head and cervical spine was performed following the standard protocol without intravenous contrast. Multiplanar CT image reconstructions of the cervical spine were also generated. RADIATION DOSE REDUCTION: This exam was performed according to the departmental dose-optimization program which  includes automated exposure control, adjustment of the mA and/or kV according to patient size and/or use of iterative reconstruction technique. COMPARISON:  CT brain 09/04/2021, CT brain and cervical spine 09/01/2021 FINDINGS: CT HEAD FINDINGS Brain: No acute territorial infarction, hemorrhage or intracranial mass. Chronic encephalomalacia within the right parietooccipital, posterior temporal region. Atrophy and chronic small vessel ischemic changes of the white matter. Chronic small left cerebellar infarct. Stable ventricle size and morphology Vascular: No hyperdense vessels.  No unexpected calcification Skull: Normal. Negative for fracture or focal lesion. Sinuses/Orbits: Small fluid level in the sphenoid sinus. Other: None CT CERVICAL SPINE FINDINGS Alignment: Straightening of the cervical spine. Facet alignment within normal limits. Skull base and vertebrae: No acute fracture. No primary bone lesion or focal pathologic process. Soft tissues and spinal canal: No prevertebral fluid or swelling. No visible canal hematoma. Disc levels: Moderate multilevel degenerative change with multiple level disc space narrowing, osteophyte and facet degenerative changes. 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 Upper chest: Negative. Other: None IMPRESSION: 1. No CT evidence for  acute intracranial abnormality. Atrophy and chronic small vessel ischemic changes of the white matter. Remote infarcts as discussed above. 2. Straightening of the cervical spine with moderate multilevel degenerative change. No acute osseous abnormality. Electronically Signed   By: Jasmine Pang M.D.   On: 07/13/2023 19:32   CT Cervical Spine Wo Contrast Result Date: 07/13/2023 CLINICAL DATA:  Head trauma fall EXAM: CT HEAD WITHOUT CONTRAST CT CERVICAL SPINE WITHOUT CONTRAST TECHNIQUE: Multidetector CT imaging of the head and cervical spine was performed following the standard protocol without intravenous contrast. Multiplanar CT image reconstructions of the cervical spine were also generated. RADIATION DOSE REDUCTION: This exam was performed according to the departmental dose-optimization program which includes automated exposure control, adjustment of the mA and/or kV according to patient size and/or use of iterative reconstruction technique. COMPARISON:  CT brain 09/04/2021, CT brain and cervical spine 09/01/2021 FINDINGS: CT HEAD FINDINGS Brain: No acute territorial infarction, hemorrhage or intracranial mass. Chronic encephalomalacia within the right parietooccipital, posterior temporal region. Atrophy and chronic small vessel ischemic changes of the white matter. Chronic small left cerebellar infarct. Stable ventricle size and morphology Vascular: No hyperdense vessels.  No unexpected calcification Skull: Normal. Negative for fracture or focal lesion. Sinuses/Orbits: Small fluid level in the sphenoid sinus. Other: None CT CERVICAL SPINE FINDINGS Alignment: Straightening of the cervical spine. Facet alignment within normal limits. Skull base and vertebrae: No acute fracture. No primary bone lesion or focal pathologic process. Soft tissues and spinal canal: No prevertebral fluid or swelling. No visible canal hematoma. Disc levels: Moderate multilevel degenerative change with multiple level disc space narrowing,  osteophyte and facet degenerative changes. 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 Upper chest: Negative. Other: None IMPRESSION: 1. No CT evidence for acute intracranial abnormality. Atrophy and chronic small vessel ischemic changes of the white matter. Remote infarcts as discussed above. 2. Straightening of the cervical spine with moderate multilevel degenerative change. No acute osseous abnormality. Electronically Signed   By: Jasmine Pang M.D.   On: 07/13/2023 19:32   DG Elbow Complete Right Result Date: 07/13/2023 CLINICAL DATA:  ETOH intoxication and fall. Laceration to elbow and upper arm. EXAM: RIGHT FOREARM - 2 VIEW; RIGHT ELBOW - COMPLETE 3+ VIEW; RIGHT HUMERUS - 2+ VIEW COMPARISON:  None available FINDINGS: Right forearm: No fracture, dislocation, or soft tissue abnormality. Right elbow: Mild soft tissue swelling overlying the olecranon without underlying fracture or dislocation. Mild  degenerative spurring noted at the tip of the coronoid process. Enthesopathic spurring noted at the insertion of the triceps on the olecranon. Irregularity of the lateral humeral epicondyle suggestive of prior episodes of epicondylitis. Right humerus: 8 mm curvilinear metallic density structure overlying the lower chest may be located within the chest or patient's left hand: The exact location is not certain. No acute fracture or dislocation of the right humerus. Advanced degenerative changes of the right acromioclavicular joint. IMPRESSION: No acute fracture or dislocation of the right forearm, elbow, or humerus. Electronically Signed   By: Acquanetta Belling M.D.   On: 07/13/2023 14:50   DG Humerus Right Result Date: 07/13/2023 CLINICAL DATA:  ETOH intoxication and fall. Laceration to elbow and upper arm. EXAM: RIGHT FOREARM - 2 VIEW; RIGHT ELBOW - COMPLETE 3+ VIEW; RIGHT HUMERUS - 2+ VIEW COMPARISON:  None available FINDINGS: Right forearm: No fracture, dislocation, or soft tissue  abnormality. Right elbow: Mild soft tissue swelling overlying the olecranon without underlying fracture or dislocation. Mild degenerative spurring noted at the tip of the coronoid process. Enthesopathic spurring noted at the insertion of the triceps on the olecranon. Irregularity of the lateral humeral epicondyle suggestive of prior episodes of epicondylitis. Right humerus: 8 mm curvilinear metallic density structure overlying the lower chest may be located within the chest or patient's left hand: The exact location is not certain. No acute fracture or dislocation of the right humerus. Advanced degenerative changes of the right acromioclavicular joint. IMPRESSION: No acute fracture or dislocation of the right forearm, elbow, or humerus. Electronically Signed   By: Acquanetta Belling M.D.   On: 07/13/2023 14:50   DG Forearm Right Result Date: 07/13/2023 CLINICAL DATA:  ETOH intoxication and fall. Laceration to elbow and upper arm. EXAM: RIGHT FOREARM - 2 VIEW; RIGHT ELBOW - COMPLETE 3+ VIEW; RIGHT HUMERUS - 2+ VIEW COMPARISON:  None available FINDINGS: Right forearm: No fracture, dislocation, or soft tissue abnormality. Right elbow: Mild soft tissue swelling overlying the olecranon without underlying fracture or dislocation. Mild degenerative spurring noted at the tip of the coronoid process. Enthesopathic spurring noted at the insertion of the triceps on the olecranon. Irregularity of the lateral humeral epicondyle suggestive of prior episodes of epicondylitis. Right humerus: 8 mm curvilinear metallic density structure overlying the lower chest may be located within the chest or patient's left hand: The exact location is not certain. No acute fracture or dislocation of the right humerus. Advanced degenerative changes of the right acromioclavicular joint. IMPRESSION: No acute fracture or dislocation of the right forearm, elbow, or humerus. Electronically Signed   By: Acquanetta Belling M.D.   On: 07/13/2023 14:50     Procedures .Laceration Repair  Date/Time: 07/13/2023 6:08 PM  Performed by: Netta Corrigan, PA-C Authorized by: Netta Corrigan, PA-C   Consent:    Consent obtained:  Emergent situation   Consent given by:  Patient   Risks, benefits, and alternatives were discussed: yes     Risks discussed:  Infection, need for additional repair, nerve damage, poor wound healing, poor cosmetic result, pain, retained foreign body, vascular damage and tendon damage Universal protocol:    Procedure explained and questions answered to patient or proxy's satisfaction: yes     Immediately prior to procedure, a time out was called: yes     Patient identity confirmed:  Verbally with patient Anesthesia:    Anesthesia method:  Local infiltration   Local anesthetic:  Lidocaine 2% WITH epi Laceration details:    Location:  Shoulder/arm   Shoulder/arm location:  R elbow   Length (cm):  3   Depth (mm):  4 Treatment:    Area cleansed with:  Saline   Amount of cleaning:  Standard   Irrigation solution:  Sterile saline   Irrigation volume:  500 mL   Irrigation method:  Pressure wash   Visualized foreign bodies/material removed: no     Debridement:  None   Undermining:  None Skin repair:    Repair method:  Sutures   Suture size:  3-0   Suture material:  Prolene   Suture technique:  Simple interrupted   Number of sutures:  6 Approximation:    Approximation:  Close Repair type:    Repair type:  Simple Post-procedure details:    Dressing:  Antibiotic ointment, bulky dressing and non-adherent dressing   Procedure completion:  Tolerated .Laceration Repair  Date/Time: 07/13/2023 6:09 PM  Performed by: Netta Corrigan, PA-C Authorized by: Netta Corrigan, PA-C   Universal protocol:    Procedure explained and questions answered to patient or proxy's satisfaction: yes     Immediately prior to procedure, a time out was called: yes     Patient identity confirmed:  Verbally with patient Anesthesia:     Anesthesia method:  None Laceration details:    Location:  Shoulder/arm   Shoulder/arm location:  R upper arm   Length (cm):  1   Depth (mm):  1 Treatment:    Area cleansed with:  Saline   Amount of cleaning:  Standard   Irrigation solution:  Sterile saline   Irrigation volume:  500 mL   Irrigation method:  Pressure wash   Visualized foreign bodies/material removed: no     Debridement:  None   Undermining:  None Skin repair:    Repair method:  Tissue adhesive Approximation:    Approximation:  Close Repair type:    Repair type:  Simple Post-procedure details:    Dressing:  Open (no dressing)   Procedure completion:  Tolerated .Laceration Repair  Date/Time: 07/13/2023 6:10 PM  Performed by: Netta Corrigan, PA-C Authorized by: Netta Corrigan, PA-C   Universal protocol:    Procedure explained and questions answered to patient or proxy's satisfaction: yes     Immediately prior to procedure, a time out was called: yes     Patient identity confirmed:  Verbally with patient Anesthesia:    Anesthesia method:  None Laceration details:    Location:  Shoulder/arm   Shoulder/arm location:  R lower arm   Length (cm):  1   Depth (mm):  1 Treatment:    Area cleansed with:  Saline   Amount of cleaning:  Standard   Irrigation solution:  Sterile saline   Irrigation volume:  500 mL   Irrigation method:  Pressure wash   Visualized foreign bodies/material removed: no     Debridement:  None   Undermining:  None Approximation:    Approximation:  Close Repair type:    Repair type:  Simple Post-procedure details:    Dressing:  Open (no dressing)   Procedure completion:  Tolerated     Medications Ordered in ED Medications  Tdap (BOOSTRIX) injection 0.5 mL (0.5 mLs Intramuscular Patient Refused/Not Given 07/13/23 1643)  bacitracin ointment (has no administration in time range)  levETIRAcetam (KEPPRA) tablet 500 mg (has no administration in time range)  lidocaine-EPINEPHrine  (XYLOCAINE W/EPI) 2 %-1:200000 (PF) injection 20 mL (20 mLs Infiltration Given by Other 07/13/23 1825)  LORazepam (ATIVAN) tablet 0.5  mg (0.5 mg Oral Given 07/13/23 1710)    ED Course/ Medical Decision Making/ A&P                                 Medical Decision Making Amount and/or Complexity of Data Reviewed Labs: ordered. Radiology: ordered.  Risk OTC drugs. Prescription drug management.   Gonzalo Stephani Police 25 y.o. presented today for fall. Working DDx that I considered at this time includes, but not limited to, alcohol induced, vasovagal episode, mechanical fall, ICH, epidural/subdural hematoma, basilar skull fracture, anemia, electrolyte abnormalities, drug-induced, arrhythmia, UTI, fracture, contusion, soft tissue injury.  R/o DDx: vasovagal episode, mechanical fall, ICH, epidural/subdural hematoma, basilar skull fracture, anemia, electrolyte abnormalities, drug-induced, arrhythmia, UTI, fracture: These are considered less likely due to history of present illness, physical exam, lab/imaging findings  Review of prior external notes: 04/16/23 office visit  Unique Tests and My Independent Interpretation:  CT Head without contrast: No acute pathology CT Cervical spine without contrast: No acute pathology CBC: Unremarkable CMP: Mild hyponatremia EKG: Sinus 89 bpm, no ST elevations indicative of ischemia or right heart strain UA: Unremarkable UDS: Unremarkable Ethanol: 381 Right elbow x-ray: Unremarkable Right humerus x-ray: Unremarkable Right forearm x-ray: Unremarkable Magnesium: Unremarkable CK: Unremarkable  Social Determinants of Health: EtOH/Substance Abuse  Discussion with Independent Historian:  niece  Discussion of Management of Tests: None  Risk: Medium: prescription drug management  Risk Stratification Score: None  Staffed with Dixon, MD  Plan: On exam patient was no acute distress but was clearly intoxicated.  Patient does have a 3 cm laceration to right  elbow along with two 1 cm lacerations are well-approximated in the same area.  Patient is neurovascularly intact and has no midline tenderness in his neck however given that he had an unwitnessed fall and is intoxicated will get CT head and neck.  Keppra level ordered along with magnesium due to history of seizure and alcohol abuse.  The cardiac monitor was ordered secondary to the patient's history of fall and to monitor the patient for dysrhythmia. Cardiac monitor by my independent interpretation showed normal sinus.  Will contact niece as patient is currently intoxicated and cannot give full history.  I spoke to the niece who states that she got home this morning and found the patient down on the ground.  Patient states that when she reviewed the camera footage patient was down for approximately 2 hours before she got home at 10:15 AM.  Patient fell on some glass and that is what he cut his right elbow.  Will give patient tetanus shot along with irrigate and suture repair.  Patient's niece states she does not think he had a seizure but is unsure.  Niece states that patient was acting his baseline when he is intoxicated.  Initially patient was going to get IM Haldol to help calm down so that we can do a laceration repair however patient was unable to sit still for an IM Jean Rosenthal as he was afraid of being stabbed.  After discussion with the patient we will do p.o. Ativan.  At this time patient also has no signs of withdrawals including tremors or tongue fasciculations and with an alcohol level of 381 do not feel he is going through withdrawals.  Nursing and I have tried multiple times to convince the patient to let us update his tetanus as his last tetanus in the system is from 2012 however patient adamantly refuses.  I spoke to the patient at length about the risk of not updating tetanus with the possibility of complications to which patient verbalized understanding of but does not want to proceed as he does  not want to be stuck again.  Due to patient not wanting to be stuck again unable to check patient's magnesium, CK, levetiracetam levels.  Patient's sodium was slightly elevated at 147 however since we are not able to get IV access we will give fluids orally to help rehydrate him.  Patient's imaging has been negative thus far and so we will continue to monitor.  I discussed with the patient a possible Librium taper however patient states he does not want to stop drinking and so will not order this. Will order patient's nighttime Keppra.  The rest of patient's labs are reassuring.  Patient is asking repeatedly to go home.  Patient be able to bear weight on his left leg while holding the bed rail as he does not have his prosthetic with him.  Patient has been eating without issue and is thus passed p.o. challenge and is repeatedly asking to be discharged.  Patient's niece has been notified and will pick him up per nursing as they state verbally they were able to reach the niece.  Discussed with patient and placed in the discharge papers that he will need to have his sutures removed in 7 days.  Discussed return precautions and place these as well in the discharge papers. Patient is clinically sober. They are talking coherently and demonstrating rational thought process. Patient has been PO challenged successfully and ambulating without difficulty. We shall discharge them shortly, and we have discussed the warning signs of alcohol withdrawal with them verbally, and the information will be provided with the discharge instructions as well. At this time patient is stable to be discharged. Patient verbalized their understanding and acceptance of the plan.  Patient was given return precautions. Patient stable for discharge at this time.  Patient verbalized understanding of plan.  This chart was dictated using voice recognition software.  Despite best efforts to proofread,  errors can occur which can change the  documentation meaning.   Final Clinical Impression(s) / ED Diagnoses Final diagnoses:  Laceration of right elbow, initial encounter  Alcoholic intoxication without complication [facility])    Rx / DC Orders ED Discharge Orders     None         Remi Deter 07/13/23 2333    Gloris Manchester, MD 07/13/23 2356

## 2023-07-13 NOTE — ED Provider Triage Note (Signed)
 Emergency Medicine Provider Triage Evaluation Note  JALEIL RENWICK , a 68 y.o. male  was evaluated in triage.  Pt complains of right elbow pain. BIB EMS. Family reports they found him on ground outside with two lacerations to right elbow. They report ETOH use  Patient unable to give me information regarding what happened  Review of Systems  Positive: HPI Negative:   Physical Exam  BP (!) 156/138 (BP Location: Left Arm)   Pulse (!) 104   Temp 98.2 F (36.8 C) (Oral)   Resp 20   SpO2 100%  Gen:   Awake, no distress   Resp:  Normal effort  MSK:   Moves extremities without difficulty  Other:  Right elbow pain. Wrapped in gauze  Medical Decision Making  Medically screening exam initiated at 1:16 PM.  Appropriate orders placed.  Shahrukh Stephani Police was informed that the remainder of the evaluation will be completed by another provider, this initial triage assessment does not replace that evaluation, and the importance of remaining in the ED until their evaluation is complete.  Labs and imaging ordered   Judithann Sheen, [place] 07/13/23 1318

## 2023-07-13 NOTE — ED Notes (Addendum)
 Pt wheelchaired to bathroom and told to pull string when ready to go back to bed. Pt not compliant and found to be on knees, shuffling back to bed without difficulty

## 2023-07-13 NOTE — ED Notes (Signed)
 Called family member to pick pt up phone went straight to voicemail.

## 2023-07-13 NOTE — ED Triage Notes (Signed)
 Patient arrived by EMS following heavy ETOH use and fall. No loc per family. Laceration to elbow and upper arm, arrived with dressing to same.

## 2023-07-13 NOTE — ED Notes (Signed)
 ETOH 381. Provider made aware

## 2023-07-13 NOTE — Discharge Instructions (Addendum)
 You have been seen in the Emergency Department (ED) today for a laceration (cut).  Please keep the cut clean but do not submerge it in the water.  It has been repaired with 6 staples or sutures that will need to be removed in about Trunk and upper extremities - 7 days days. Please follow up with your doctor, an urgent care, or return to the ED for suture removal.    Please take Tylenol (acetaminophen) as needed for discomfort as written on the box.   I have also attached resources for you to help with your alcohol use.  Please follow up with your doctor as soon as possible regarding today's emergent visit.   Return to the ED or call your doctor if you notice any signs of infection such as fever, increased pain, increased redness, pus, or other symptoms that concern you.

## 2023-07-14 NOTE — ED Notes (Signed)
 Pt says he will be able to get in his home if we get him a ride. PT says he knows the code to his door.

## 2023-07-15 LAB — LEVETIRACETAM LEVEL: Levetiracetam Lvl: <2 ug/mL — ABNORMAL LOW (ref 10.0–40.0)

## 2023-07-15 NOTE — Progress Notes (Signed)
 Subjective  Patient ID: Dominic Carter  is a 68 y.o. male here in clinic for:  Chief Complaint  Patient presents with  . Hypertension    6 month follow up. Patient is needing a wheelchair  Patient fell the other day and has stitches in his right elbow.     The following information was reviewed by members of the visit team:   Past Medical History:  Diagnosis Date  . Hypertension      Past Surgical History:  Procedure Laterality Date  . BELOW KNEE LEG AMPUTATION Right    Procedure: BELOW KNEE LEG AMPUTATION     Family History  Problem Relation Name Age of Onset  . Hypertension Mother    . Hypertension Sister    . Hypertension Brother    . Hypertension Maternal Grandmother    . Hypertension Maternal Grandfather    . Glaucoma Neg Hx    . Macular degeneration Neg Hx       Social History   Socioeconomic History  . Marital status: Married    Spouse name: Not on file  . Number of children: Not on file  . Years of education: Not on file  . Highest education level: Not on file  Occupational History  . Not on file  Tobacco Use  . Smoking status: Every Day    Current packs/day: 1.00    Average packs/day: 1 pack/day for 53.3 years (53.3 ttl pk-yrs)    Types: Cigarettes    Start date: 67  . Smokeless tobacco: Never  . Tobacco comments:     Per 2024 LS form/chart (1)   Substance and Sexual Activity  . Alcohol use: Not Currently  . Drug use: No  . Sexual activity: Not on file  Other Topics Concern  . Not on file  Social History Narrative  . Not on file   Social Drivers of Health   Food Insecurity: Low Risk  (07/15/2023)   Food vital sign   . Within the past 12 months, you worried that your food would run out before you got money to buy more: Never true   . Within the past 12 months, the food you bought just didn't last and you didn't have money to get more: Never true  Transportation Needs: No Transportation Needs (07/15/2023)   Transportation   . In the  past 12 months, has lack of reliable transportation kept you from medical appointments, meetings, work or from getting things needed for daily living? : No  Safety: High Risk (07/15/2023)   Safety   . How often does anyone, including family and friends, physically hurt you?: Never   . How often does anyone, including family and friends, insult or talk down to you?: Rarely   . How often does anyone, including family and friends, threaten you with harm?: Never   . How often does anyone, including family and friends, scream or curse at you?: Rarely  Living Situation: Low Risk  (07/15/2023)   Living Situation   . What is your living situation today?: I have a steady place to live   . Think about the place you live. Do you have problems with any of the following? Choose all that apply:: None/None on this list      Current Outpatient Medications on File Prior to Visit  Medication Sig Dispense Refill  . amLODIPine  (NORVASC ) 10 mg tablet Take 1 tablet (10 mg total) by mouth daily. 90 tablet 3  . pravastatin  (PRAVACHOL ) 20 mg tablet  Take 1 tablet (20 mg total) by mouth daily. 90 tablet 1  . amoxicillin -pot clavulanate (AUGMENTIN ) 875-125 mg per tablet Take 1 tablet by mouth 2 (two) times a day. (Patient not taking: Reported on 07/15/2023)    . aspirin  81 mg cap 1 a day (Patient not taking: Reported on 07/15/2023)    . cephALEXin  (KEFLEX ) 500 mg capsule Take 1 capsule by mouth 4 (four) times a day before meals and nightly. (Patient not taking: Reported on 07/15/2023)    . doxycycline  (VIBRAMYCIN ) 100 mg capsule Take 1 capsule (100 mg total) by mouth 2 (two) times daily for 7 days. Take WITH FOOD (Patient not taking: Reported on 07/15/2023)    . famotidine  (PEPCID ) 40 mg tablet Take 1 tablet (40 mg total) by mouth daily. (Patient not taking: Reported on 07/15/2023) 90 tablet 3  . fenofibrate nanocrystallized (TRICOR) 48 mg tablet Take 48 mg by mouth Once Daily. (Patient not taking: Reported on 01/14/2023) 90 tablet 3   . levETIRAcetam  (KEPPRA ) 500 mg tablet Patient not taking. (Patient not taking: Reported on 01/14/2023) 30 tablet 0  . nicotine  (NICODERM CQ ) 14 mg/24 hr patch Place 1 patch on the skin daily. (Patient not taking: Reported on 01/14/2023) 28 patch 0  . ondansetron  (ZOFRAN ) 4 mg tablet Take 4 mg by mouth. (Patient not taking: Reported on 01/14/2023)     No current facility-administered medications on file prior to visit.     Patient has no known allergies.   Most recent PHQ-2 results:   Most recent PHQ-9 result:   PHQ-9 Question # 9 Thoughts that you would be better off dead or hurting yourself in some way: Almost all (07/15/2023  9:28 AM)  Interpretation:     Depression Plan: Normal/Negative Screening   No results found for this or any previous visit (from the past 12 weeks).   HPI Patient is a 68 year old male with a history of right posterior MCA stroke, alcohol-related hepatitis, HTN, HLD, DVT, right BKA who is here today for f/u HTN; niece also requested to have him evaluated for a motor wheelchair assessment.   Accompanied by his niece Dominic Carter.    Patient and his niece would also like a power wheelchair assessment done- had asked for at his previous visit in 10/24, and he was referred to a physical therapist. Niece reports that  she may have dropped the ball on that  as he never followed up with the physical therapy appointment.   They feel as though he would be better served with this.  He has a regular wheelchair at home but it is old and weathered and would prefer one where he can operate it easily.  Of note, patient was seen in the ER 2 days ago after sustaining a fall ( was inebriated at the time), that caused a laceration to his right elbow.  He had sutures placed and wonders when due to be removed.  Denies any fever,  pain or discharge from the repaired laceration.  Hypertension This is a chronic problem. The current episode started more than 1 year ago. The problem is  unchanged. The problem is controlled. Associated symptoms include peripheral edema and sweats. Pertinent negatives include no anxiety, blurred vision, chest pain, headaches, malaise/fatigue, neck pain, orthopnea, palpitations, PND or shortness of breath. Treatments tried: currently on amlodipine  10 mg daily. The current treatment provides no improvement. Hypertensive end-organ damage includes CVA.     Review of Systems  Constitutional:  Negative for malaise/fatigue.  Eyes:  Negative for  blurred vision.  Respiratory:  Negative for shortness of breath.   Cardiovascular:  Negative for chest pain, palpitations, orthopnea and PND.  Musculoskeletal:  Negative for neck pain.  Neurological:  Negative for headaches.  All other systems reviewed and are negative.    Objective  BP 168/92 (BP Location: Right arm, Patient Position: Sitting)   Pulse 100   Ht 1.803 m (5' 11)   Wt 66.1 kg (145 lb 12.8 oz)   BMI 20.33 kg/m    Physical Exam Vitals reviewed.  Constitutional:      Appearance: Normal appearance.  HENT:     Head: Normocephalic and atraumatic.     Right Ear: Tympanic membrane normal.     Left Ear: Tympanic membrane normal.  Cardiovascular:     Rate and Rhythm: Normal rate and regular rhythm.     Pulses: Normal pulses.     Heart sounds: Normal heart sounds.  Pulmonary:     Effort: Pulmonary effort is normal.     Breath sounds: Normal breath sounds.  Abdominal:     General: Abdomen is flat.     Palpations: Abdomen is soft.     Tenderness: There is no abdominal tenderness.  Musculoskeletal:     Cervical back: Neck supple.     Comments: Right BKA with right leg prothesis present   Skin:    General: Skin is warm and dry.     Comments: 6 dissolvable sutures present at right elbow joint.  No tenderness, redness or discharge present at site.    Neurological:     Mental Status: He is alert and oriented to person, place, and time.  Psychiatric:        Mood and Affect: Mood normal.         Behavior: Behavior normal.     Assessment/Plan  Diagnoses and all orders for this visit:  Primary hypertension HTN is uncontrolled.  Added losartan  25 mg daily.  Continue amlodipine  10 mg daily. -     losartan  (COZAAR ) 25 mg tablet; Take 1 tablet (25 mg total) by mouth daily. -     amLODIPine  (NORVASC ) 10 mg tablet; Take 1 tablet (10 mg total) by mouth daily. -    Alcohol cessation advised.  DASH diet given.  RTC in 3 months for follow-up of HTN.  Status post below knee amputation of right lower extremity    (CMD) -     Ambulatory referral to Physical Therapy; Future  Encounter for wheelchair assessment -     Ambulatory referral to Physical Therapy; Future  Need for physical therapy assessment -     Ambulatory referral to Physical Therapy; Future  Elbow laceration, right, subsequent encounter -Patient was instructed to return to clinic in 5 to 7 days for suture removal.  Electronically signed by: Elveria Tedi Kaiser, MD 07/15/2023 3:00 PM

## 2023-07-22 ENCOUNTER — Encounter: Payer: Self-pay | Admitting: Physical Therapy

## 2023-07-22 ENCOUNTER — Ambulatory Visit: Attending: Family Medicine | Admitting: Physical Therapy

## 2023-07-22 DIAGNOSIS — R293 Abnormal posture: Secondary | ICD-10-CM

## 2023-07-22 DIAGNOSIS — R2689 Other abnormalities of gait and mobility: Secondary | ICD-10-CM | POA: Diagnosis present

## 2023-07-22 DIAGNOSIS — Z9181 History of falling: Secondary | ICD-10-CM | POA: Diagnosis present

## 2023-07-22 DIAGNOSIS — M6281 Muscle weakness (generalized): Secondary | ICD-10-CM | POA: Diagnosis present

## 2023-07-22 DIAGNOSIS — R2681 Unsteadiness on feet: Secondary | ICD-10-CM

## 2023-07-22 NOTE — Therapy (Signed)
 OUTPATIENT PHYSICAL THERAPY PROSTHETICS EVALUATION   Patient Name: Dominic Carter MRN: [id number] DOB:19-Sep-1955, 68 y.o., male Today's Date: 07/22/2023  PCP: Courtney Paris, NP REFERRING PROVIDER: Murrell Converse, MD  END OF SESSION:  PT End of Session - 07/22/23 0932     Visit Number 1    Number of Visits 13    Date for PT Re-Evaluation 09/09/23    Authorization Type [employer] Dual Complete    PT Start Time 0930    PT Stop Time 1008    PT Time Calculation (min) 38 min    Equipment Utilized During Treatment Other (comment)   R BKA prosthesis   Activity Tolerance Patient tolerated treatment well    Behavior During Therapy WFL for tasks assessed/performed             Past Medical History:  Diagnosis Date   DVT (deep venous thrombosis) (HCC)    Dyslipidemia    ETOH abuse    Hx of BKA, right (HCC)    Hypertension    Seizure disorder (HCC)    Past Surgical History:  Procedure Laterality Date   BELOW KNEE LEG AMPUTATION Right    IRRIGATION AND DEBRIDEMENT ABSCESS Right 01/06/2022   Procedure: IRRIGATION AND DEBRIDEMENT ABSCESS;  Surgeon: Emelia Loron, MD;  Location: [facility] OR;  Service: General;  Laterality: Right;   Patient Active Problem List   Diagnosis Date Noted   Right inguinal abscess 01/06/2022   Renal abscess, left 01/06/2022   Transaminitis 09/01/2021   Tobacco abuse 09/01/2021   History of CVA (cerebrovascular accident) 09/01/2021   Scalp wound 09/01/2021   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)    Dyslipidemia 01/30/2020   Hepatitis, alcoholic, acute 01/30/2020   Seizure disorder (HCC) 01/20/2020   ETOH abuse 01/20/2020   HTN (hypertension) 01/20/2020   Chronic ischemic right MCA stroke 01/20/2020    ONSET DATE: 07/15/2023 (referral)  REFERRING DIAG: Z89.511 (ICD-10-CM) - Acquired absence of right leg below knee Z76.89 (ICD-10-CM) - Persons encountering health services in other specified circumstances Z01.89 (ICD-10-CM) -  Encounter for other specified special examinations  THERAPY DIAG:  Unsteadiness on feet  Muscle weakness (generalized)  History of falling  Abnormal posture  Other abnormalities of gait and mobility  Rationale for Evaluation and Treatment: Rehabilitation  SUBJECTIVE:   SUBJECTIVE STATEMENT: Pt presents w/short RW and R BKA prosthetic, niece present and provides assistance w/subjective. Pt reports he has had 1 fall this year, niece states he has had multiple. Niece reports all his falls were due to alcohol use. Pt reports he has decided to stop drinking. Pt states he frequently crawls around the house, prefers this over walking as he is faster this way. Had his RLE amputation in 2012 and obtained his new prosthetic in October 2024.    Pt accompanied by:  Niece, [name]   PERTINENT HISTORY: alcohol abuse, seizure disorder on Keppra, right BKA, DVT not currently anticoagulated, hypertension, R posterior MCA stroke   PAIN:  Are you having pain? No  PRECAUTIONS: Fall  RED FLAGS: None   WEIGHT BEARING RESTRICTIONS: No  FALLS: Has patient fallen in last 6 months? Yes. Number of falls at least 3  LIVING ENVIRONMENT: Lives with: lives with their family Lives in: House/apartment Home Access: Stairs to enter Home layout: One level Stairs: Yes: External: 4 steps; on right going up, on left going up, and can reach both Has following equipment at home: Single point cane, Walker - 2 wheeled, and Wheelchair (manual)  PLOF:  Requires assistive device for independence, Needs assistance with homemaking, and Needs assistance with gait  PATIENT GOALS: "Balance"  OBJECTIVE:  Note: Objective measures were completed at Evaluation unless otherwise noted.  DIAGNOSTIC FINDINGS: CT of head from 07/13/23   IMPRESSION: 1. No CT evidence for acute intracranial abnormality. Atrophy and chronic small vessel ischemic changes of the white matter. Remote infarcts as discussed above. 2.  Straightening of the cervical spine with moderate multilevel degenerative change. No acute osseous abnormality.  COGNITION: Overall cognitive status: Impaired   SENSATION: Pt reports numbness/tingling in R residual limb   POSTURE: rounded shoulders, forward head, increased thoracic kyphosis, and weight shift right  LOWER EXTREMITY ROM:  Active ROM Right eval Left eval  Hip flexion    Hip extension    Hip abduction    Hip adduction    Hip internal rotation    Hip external rotation    Knee flexion    Knee extension    Ankle dorsiflexion    Ankle plantarflexion    Ankle inversion    Ankle eversion     (Blank rows = not tested)  LOWER EXTREMITY MMT: Tested in seated position   MMT Right eval Left eval  Hip flexion 4+ 5  Hip extension    Hip abduction 5 5  Hip adduction 5 5  Hip internal rotation    Hip external rotation    Knee flexion 5 5  Knee extension 5 5  Ankle dorsiflexion  5  Ankle plantarflexion    Ankle inversion    Ankle eversion    (Blank rows = not tested)  BED MOBILITY:  Independent per pt   TRANSFERS: Sit to stand: Modified independence Stand to sit: Modified independence  GAIT: Gait pattern: step through pattern, decreased stride length, knee flexed in stance- Right, and trunk flexed Distance walked: Various clinic distances  Assistive device utilized: Environmental consultant - 2 wheeled Level of assistance: CGA Gait velocity: 1.9 ft/sec Comments: Of note, pt's walker is too short and unable to be adjusted as it is rusted and needs to be replaced. Used clinic RW to obtain gait speed measure. CGA for safety as pt tends to push RW away from him   FUNCTIONAL TESTS:   [facility] PT Assessment - 07/22/23 1000       Transfers   Five time sit to stand comments  27.78s   BUE support, LLE posterior     Ambulation/Gait   Gait velocity 32.8' over 17.25s = 1.9 ft/s w/RW   CGA              CURRENT PROSTHETIC WEAR ASSESSMENT: Patient is independent with: skin  check, residual limb care, care of non-amputated limb, prosthetic cleaning, and ply sock cleaning Patient is dependent with: correct ply sock adjustment, proper wear schedule/adjustment, and proper weight-bearing schedule/adjustment Donning prosthesis: Complete Independence Doffing prosthesis: Complete Independence Prosthetic wear tolerance: 3-4 hours/day, 7 days/week Prosthetic weight bearing tolerance: 15 minutes Edema: Pt denies swelling Residual limb condition: Did not assess  Prosthetic description: Pin lock  K code/activity level with prosthetic use: Level 2  TREATMENT:  Self-care/home management  Informed pt and niece of need to obtain new RW as pt's is not safe. Informed niece of [facility], [employer] and [employer] to purchase RW.  Encouraged niece to call pt's MD to obtain [facility] referral and we can schedule that. Pt wanting power chair, but educated pt and niece on limitations with power chair (transportation, fit inside house) and pt may need custom WC. Will discuss further in future sessions.  Encouraged pt make appointment w/eye doctor to obtain new glasses as pt's vision is poor and he has been needing to go see eye doctor, but has not made his appointments.  Briefly discussed need to use socks on RLE as limb changes in size, as pt reports his prosthetic starts to hurt due to sinking too low in it. Will continue to educate in PT.     PATIENT EDUCATION: Education details: POC, eval findings, see self-care above Person educated: Patient and Niece Education method: Explanation and Demonstration Education comprehension: verbalized understanding and needs further education  HOME EXERCISE PROGRAM: To be established   ASSESSMENT:  CLINICAL IMPRESSION: Patient is a 68 year old male referred to Neuro OPPT for R BKA. Pt's PMH is significant  for: alcohol abuse, seizure disorder on Keppra, right BKA, DVT not currently anticoagulated, hypertension. The following deficits were present during the exam: impaired vision, decreased safety awareness, improper body mechanics and decreased functional ROM. Based on falls history, pt is an incr risk for falls. Pt would benefit from skilled PT to address these impairments and functional limitations to maximize functional mobility independence.    OBJECTIVE IMPAIRMENTS: Abnormal gait, decreased activity tolerance, decreased balance, decreased cognition, decreased knowledge of condition, decreased knowledge of use of DME, decreased mobility, difficulty walking, decreased ROM, decreased safety awareness, impaired sensation, impaired vision/preception, improper body mechanics, prosthetic dependency , and pain  ACTIVITY LIMITATIONS: carrying, lifting, squatting, hygiene/grooming, locomotion level, and caring for others  PARTICIPATION LIMITATIONS: meal prep, cleaning, laundry, driving, shopping, community activity, and yard work  PERSONAL FACTORS: Behavior pattern, Past/current experiences, Transportation, and 1-2 comorbidities: alcohol abuse and R BKA  are also affecting patient's functional outcome.   REHAB POTENTIAL: Good  CLINICAL DECISION MAKING: Evolving/moderate complexity  EVALUATION COMPLEXITY: Moderate   GOALS: Goals reviewed with patient? Yes  SHORT TERM GOALS: Target date: 08/19/2023   Pt will perform initial HEP w/S* from niece for improved strength, balance, transfers and gait.  Baseline: not established on eval  Goal status: INITIAL  2.  Pt will obtain proper RW for improved safety at home and reduced fall risk  Baseline: RW is too short  Goal status: INITIAL  3.  Pt will improve 5 x STS to less than or equal to 24 seconds to demonstrate improved functional strength and transfer efficiency.   Baseline: 27.78s w/BUE support  Goal status: INITIAL  4.  Pt will improve gait  velocity to at least 2.2 ft/s w/LRAD and SBA for improved gait efficiency and safety  Baseline: 1.9 ft/s w/RW and CGA Goal status: INITIAL  5.  Pt will demonstrate independence w/sock management for reduced pain levels and improved functional use of prosthetic  Baseline:  Goal status: INITIAL  6.  Berg to be assessed and LTG updated  Baseline:  Goal status: INITIAL  LONG TERM GOALS: Target date: 09/02/2023   Pt will perform final HEP w/S* from niece for improved strength, balance, transfers and gait.  Baseline:  Goal status: INITIAL  2.  Pt will improve 5 x  STS to less than or equal to 20 seconds to demonstrate improved functional strength and transfer efficiency.  Baseline: 27.78s w/BUE support  Goal status: INITIAL  3.  Pt will improve gait velocity to at least 2.5 ft/s w/LRAD for improved gait efficiency and safety  Baseline: 1.9 ft/s w/RW  Goal status: INITIAL  4.  Berg goal  Baseline:  Goal status: INITIAL   PLAN:  PT FREQUENCY: 2x/week  PT DURATION: 6 weeks  PLANNED INTERVENTIONS: [id number]- PT Re-evaluation, 97750- Physical Performance Testing, 97110-Therapeutic exercises, 97530- Therapeutic activity, [id number]- Neuromuscular re-education, 97535- Self Care, [id number]- Manual therapy, [phone]- Gait training, [phone]- Orthotic/Prosthetic subsequent, [phone]- Canalith repositioning, [phone]- Aquatic Therapy, [phone]- Electrical stimulation (manual), Patient/Family education, Balance training, Stair training, Dry Needling, Joint mobilization, Spinal mobilization, Vestibular training, and DME instructions  PLAN FOR NEXT SESSION: Berg and update goal. Did we get WC eval? Did they get RW? Scifit for endurance, HEP for balance    Shilo Philipson E Summers Buendia, PT, DPT 07/22/2023, 10:10 AM

## 2023-07-27 ENCOUNTER — Ambulatory Visit: Admitting: Physical Therapy

## 2023-07-29 ENCOUNTER — Ambulatory Visit: Admitting: Physical Therapy

## 2023-07-29 VITALS — BP 123/68 | HR 80

## 2023-07-29 DIAGNOSIS — R2681 Unsteadiness on feet: Secondary | ICD-10-CM | POA: Diagnosis not present

## 2023-07-29 DIAGNOSIS — R2689 Other abnormalities of gait and mobility: Secondary | ICD-10-CM

## 2023-07-29 DIAGNOSIS — M6281 Muscle weakness (generalized): Secondary | ICD-10-CM

## 2023-07-29 DIAGNOSIS — Z9181 History of falling: Secondary | ICD-10-CM

## 2023-07-29 NOTE — Therapy (Signed)
 OUTPATIENT PHYSICAL THERAPY PROSTHETICS TREATMENT   Patient Name: Dominic Carter MRN: [id number] DOB:May 16, 1955, 67 y.o., male Today's Date: 07/29/2023  PCP: Earlis Glimpse, NP REFERRING PROVIDER: Daved Eriksson, MD  END OF SESSION:  PT End of Session - 07/29/23 0902     Visit Number 2    Number of Visits 13    Date for PT Re-Evaluation 09/09/23    Authorization Type [employer] Dual Complete    PT Start Time 0901   Pt arrived late   PT Stop Time 0929    PT Time Calculation (min) 28 min    Equipment Utilized During Treatment Other (comment);Gait belt   R BKA prosthesis   Activity Tolerance Patient tolerated treatment well    Behavior During Therapy WFL for tasks assessed/performed              Past Medical History:  Diagnosis Date   DVT (deep venous thrombosis) (HCC)    Dyslipidemia    ETOH abuse    Hx of BKA, right (HCC)    Hypertension    Seizure disorder (HCC)    Past Surgical History:  Procedure Laterality Date   BELOW KNEE LEG AMPUTATION Right    IRRIGATION AND DEBRIDEMENT ABSCESS Right 01/06/2022   Procedure: IRRIGATION AND DEBRIDEMENT ABSCESS;  Surgeon: Enid Harry, MD;  Location: [facility] OR;  Service: General;  Laterality: Right;   Patient Active Problem List   Diagnosis Date Noted   Right inguinal abscess 01/06/2022   Renal abscess, left 01/06/2022   Transaminitis 09/01/2021   Tobacco abuse 09/01/2021   History of CVA (cerebrovascular accident) 09/01/2021   Scalp wound 09/01/2021   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)    Dyslipidemia 01/30/2020   Hepatitis, alcoholic, acute 01/30/2020   Seizure disorder (HCC) 01/20/2020   ETOH abuse 01/20/2020   HTN (hypertension) 01/20/2020   Chronic ischemic right MCA stroke 01/20/2020    ONSET DATE: 07/15/2023 (referral)  REFERRING DIAG: Z89.511 (ICD-10-CM) - Acquired absence of right leg below knee Z76.89 (ICD-10-CM) - Persons encountering health services in other specified  circumstances Z01.89 (ICD-10-CM) - Encounter for other specified special examinations  THERAPY DIAG:  Unsteadiness on feet  Muscle weakness (generalized)  History of falling  Other abnormalities of gait and mobility  Rationale for Evaluation and Treatment: Rehabilitation  SUBJECTIVE:   SUBJECTIVE STATEMENT: Pt presents w/[facility]QC w/missing rubber piece. Denies falls. Reports some discomfort due to hernia, but no true pain.    Pt accompanied by:  Niece, [name] (in lobby)   PERTINENT HISTORY: alcohol abuse, seizure disorder on Keppra , right BKA, DVT not currently anticoagulated, hypertension, R posterior MCA stroke   PAIN:  Are you having pain? No  PRECAUTIONS: Fall  RED FLAGS: None   WEIGHT BEARING RESTRICTIONS: No  FALLS: Has patient fallen in last 6 months? Yes. Number of falls at least 3  LIVING ENVIRONMENT: Lives with: lives with their family Lives in: House/apartment Home Access: Stairs to enter Home layout: One level Stairs: Yes: External: 4 steps; on right going up, on left going up, and can reach both Has following equipment at home: Single point cane, Walker - 2 wheeled, and Wheelchair (manual)  PLOF: Requires assistive device for independence, Needs assistance with homemaking, and Needs assistance with gait  PATIENT GOALS: "Balance"  OBJECTIVE:  Note: Objective measures were completed at Evaluation unless otherwise noted.  DIAGNOSTIC FINDINGS: CT of head from 07/13/23   IMPRESSION: 1. No CT evidence for acute intracranial abnormality. Atrophy and chronic small vessel ischemic  changes of the white matter. Remote infarcts as discussed above. 2. Straightening of the cervical spine with moderate multilevel degenerative change. No acute osseous abnormality.  COGNITION: Overall cognitive status: Impaired   SENSATION: Pt reports numbness/tingling in R residual limb   POSTURE: rounded shoulders, forward head, increased thoracic kyphosis, and weight shift  right  LOWER EXTREMITY ROM:  Active ROM Right eval Left eval  Hip flexion    Hip extension    Hip abduction    Hip adduction    Hip internal rotation    Hip external rotation    Knee flexion    Knee extension    Ankle dorsiflexion    Ankle plantarflexion    Ankle inversion    Ankle eversion     (Blank rows = not tested)  LOWER EXTREMITY MMT: Tested in seated position   MMT Right eval Left eval  Hip flexion 4+ 5  Hip extension    Hip abduction 5 5  Hip adduction 5 5  Hip internal rotation    Hip external rotation    Knee flexion 5 5  Knee extension 5 5  Ankle dorsiflexion  5  Ankle plantarflexion    Ankle inversion    Ankle eversion    (Blank rows = not tested)  BED MOBILITY:  Independent per pt   TRANSFERS: Sit to stand: Modified independence Stand to sit: Modified independence  GAIT: Gait pattern: step through pattern, decreased stride length, knee flexed in stance- Right, and trunk flexed Distance walked: Various clinic distances  Assistive device utilized: Environmental consultant - 2 wheeled Level of assistance: CGA Gait velocity: 1.9 ft/sec Comments: Of note, pt's walker is too short and unable to be adjusted as it is rusted and needs to be replaced. Used clinic RW to obtain gait speed measure. CGA for safety as pt tends to push RW away from him   FUNCTIONAL TESTS:   [facility] PT Assessment - 07/29/23 0907       Balance   Balance Assessed Yes      Standardized Balance Assessment   Standardized Balance Assessment Berg Balance Test      Berg Balance Test   Sit to Stand Able to stand without using hands and stabilize independently    Standing Unsupported Able to stand safely 2 minutes    Sitting with Back Unsupported but Feet Supported on Floor or Stool Able to sit safely and securely 2 minutes    Stand to Sit Sits safely with minimal use of hands    Transfers Able to transfer safely, definite need of hands    Standing Unsupported with Eyes Closed Able to stand 10  seconds with supervision    Standing Unsupported with Feet Together Needs help to attain position but able to stand for 30 seconds with feet together    From Standing, Reach Forward with Outstretched Arm Can reach forward >12 cm safely (5")    From Standing Position, Pick up Object from Floor Able to pick up shoe, needs supervision    From Standing Position, Turn to Look Behind Over each Shoulder Looks behind from both sides and weight shifts well    Turn 360 Degrees Needs close supervision or verbal cueing   10.72s to L   Standing Unsupported, Alternately Place Feet on Step/Stool Able to complete >2 steps/needs minimal assist    Standing Unsupported, One Foot in Front Able to take small step independently and hold 30 seconds    Standing on One Leg Unable to try or needs  assist to prevent fall    Total Score 37    Berg comment: Significant fall risk                CURRENT PROSTHETIC WEAR ASSESSMENT: Patient is independent with: skin check, residual limb care, care of non-amputated limb, prosthetic cleaning, and ply sock cleaning Patient is dependent with: correct ply sock adjustment, proper wear schedule/adjustment, and proper weight-bearing schedule/adjustment Donning prosthesis: Complete Independence Doffing prosthesis: Complete Independence Prosthetic wear tolerance: 3-4 hours/day, 7 days/week Prosthetic weight bearing tolerance: 15 minutes Edema: Pt denies swelling Residual limb condition: Did not assess  Prosthetic description: Pin lock  K code/activity level with prosthetic use: Level 2            VITALS  Vitals:   07/29/23 0905  BP: 123/68  Pulse: 80                                                                                                                    TREATMENT:  Self-care/home management  Assessed BP (see above) and WNL  Physical Performance   [facility] PT Assessment - 07/29/23 0907       Balance   Balance Assessed Yes      Standardized Balance  Assessment   Standardized Balance Assessment Berg Balance Test      Berg Balance Test   Sit to Stand Able to stand without using hands and stabilize independently    Standing Unsupported Able to stand safely 2 minutes    Sitting with Back Unsupported but Feet Supported on Floor or Stool Able to sit safely and securely 2 minutes    Stand to Sit Sits safely with minimal use of hands    Transfers Able to transfer safely, definite need of hands    Standing Unsupported with Eyes Closed Able to stand 10 seconds with supervision    Standing Unsupported with Feet Together Needs help to attain position but able to stand for 30 seconds with feet together    From Standing, Reach Forward with Outstretched Arm Can reach forward >12 cm safely (5")    From Standing Position, Pick up Object from Floor Able to pick up shoe, needs supervision    From Standing Position, Turn to Look Behind Over each Shoulder Looks behind from both sides and weight shifts well    Turn 360 Degrees Needs close supervision or verbal cueing   10.72s to L   Standing Unsupported, Alternately Place Feet on Step/Stool Able to complete >2 steps/needs minimal assist    Standing Unsupported, One Foot in Front Able to take small step independently and hold 30 seconds    Standing on One Leg Unable to try or needs assist to prevent fall    Total Score 37    Berg comment: Significant fall risk            Ther Act  Established initial HEP (see bolded below) for improved single leg stability and LE coordination. Pt challenged by stabilizing on RLE, but able to  perform w/single UE support. Strongly educated pt on safe setup at home (performing at counter top), which pt verbalized understanding.    PATIENT EDUCATION: Education details: Scientist, research (medical), initial HEP Person educated: Patient Education method: Explanation, Demonstration, and Handouts Education comprehension: verbalized understanding, returned demonstration, and needs further  education  HOME EXERCISE PROGRAM: Access Code: [email or web address] URL: [email or web address] Date: 07/29/2023 Prepared by: Burleigh Carp Sylvanus Telford  Exercises - Standing Anterior Toe Taps  - 1 x daily - 7 x weekly - 3 sets - 10 reps  ASSESSMENT:  CLINICAL IMPRESSION: Session limited due to pt's late arrival. Emphasis of skilled PT session on balance assessment via Berg and establishing initial HEP. Pt scored a 37/56 on Berg, indicative of significant fall risk. Pt most challenged by narrow BOS and single leg stability. Established HEP to work on this safely at home, which pt demonstrated well. Continue POC.    OBJECTIVE IMPAIRMENTS: Abnormal gait, decreased activity tolerance, decreased balance, decreased cognition, decreased knowledge of condition, decreased knowledge of use of DME, decreased mobility, difficulty walking, decreased ROM, decreased safety awareness, impaired sensation, impaired vision/preception, improper body mechanics, prosthetic dependency , and pain  ACTIVITY LIMITATIONS: carrying, lifting, squatting, hygiene/grooming, locomotion level, and caring for others  PARTICIPATION LIMITATIONS: meal prep, cleaning, laundry, driving, shopping, community activity, and yard work  PERSONAL FACTORS: Behavior pattern, Past/current experiences, Transportation, and 1-2 comorbidities: alcohol abuse and R BKA  are also affecting patient's functional outcome.   REHAB POTENTIAL: Good  CLINICAL DECISION MAKING: Evolving/moderate complexity  EVALUATION COMPLEXITY: Moderate   GOALS: Goals reviewed with patient? Yes  SHORT TERM GOALS: Target date: 08/19/2023   Pt will perform initial HEP w/S* from niece for improved strength, balance, transfers and gait.  Baseline: not established on eval  Goal status: INITIAL  2.  Pt will obtain proper RW for improved safety at home and reduced fall risk  Baseline: RW is too short  Goal status: INITIAL  3.  Pt will improve 5 x STS to less than or  equal to 24 seconds to demonstrate improved functional strength and transfer efficiency.   Baseline: 27.78s w/BUE support  Goal status: INITIAL  4.  Pt will improve gait velocity to at least 2.2 ft/s w/LRAD and SBA for improved gait efficiency and safety  Baseline: 1.9 ft/s w/RW and CGA Goal status: INITIAL  5.  Pt will demonstrate independence w/sock management for reduced pain levels and improved functional use of prosthetic  Baseline:  Goal status: INITIAL  6.  Berg to be assessed and LTG updated  Baseline:  Goal status: MET  LONG TERM GOALS: Target date: 09/02/2023   Pt will perform final HEP w/S* from niece for improved strength, balance, transfers and gait.  Baseline:  Goal status: INITIAL  2.  Pt will improve 5 x STS to less than or equal to 20 seconds to demonstrate improved functional strength and transfer efficiency.  Baseline: 27.78s w/BUE support  Goal status: INITIAL  3.  Pt will improve gait velocity to at least 2.5 ft/s w/LRAD for improved gait efficiency and safety  Baseline: 1.9 ft/s w/RW  Goal status: INITIAL  4.  Pt will improve Berg score to 44/56 for decreased fall risk  Baseline: 37/56 Goal status: REVISED   PLAN:  PT FREQUENCY: 2x/week  PT DURATION: 6 weeks  PLANNED INTERVENTIONS: 97164- PT Re-evaluation, 97750- Physical Performance Testing, 97110-Therapeutic exercises, 97530- Therapeutic activity, [id number]- Neuromuscular re-education, 97535- Self Care, [id number]- Manual therapy, [id number]- Gait training, [id number]- Orthotic/Prosthetic subsequent, [id number]- Canalith repositioning, [id number]- Aquatic Therapy, [id number]-  Electrical stimulation (manual), Patient/Family education, Balance training, Stair training, Dry Needling, Joint mobilization, Spinal mobilization, Vestibular training, and DME instructions  PLAN FOR NEXT SESSION: Did we get WC eval? Did they get RW? Scifit for endurance, add to HEP for balance, single leg stability on RLE, LE coordination, hip abduction  strength    Maudry Zeidan E Ketina Mars, PT, DPT 07/29/2023, 11:59 AM

## 2023-07-30 ENCOUNTER — Other Ambulatory Visit: Payer: Self-pay

## 2023-07-30 ENCOUNTER — Encounter (HOSPITAL_COMMUNITY): Payer: Self-pay | Admitting: General Surgery

## 2023-07-30 NOTE — Progress Notes (Addendum)
 SDW CALL  Patient was given pre-op instructions over the phone. The opportunity was given for the patient to ask questions. No further questions asked. Patient verbalized understanding of instructions given.   PCP - Steve El Cardiologist - denies  PPM/ICD - denies Device Orders -  Rep Notified -   Chest x-ray -  EKG - 07/14/23 Stress Test - denies ECHO - 01/20/20 Cardiac Cath - denies  Sleep Study - denies CPAP - no  Fasting Blood Sugar - na Checks Blood Sugar _____ times a day  Blood Thinner Instructions:na Aspirin  Instructions:Patient and his niece, [name] state that patient is not taking aspirin .  ERAS Protcol -clears until 0430. PRE-SURGERY Ensure or G2-   COVID TEST- na   Anesthesia review: yes-pt had ER visit 05/30/23 for seizure DT ETOH withdrawal. Pt is not taking Kepra because he said he had stopped drinking.  Last drink was 07/13/23 when he had approximately 3 cans of beer according to his niece.He fell and required stiitches in his right elbow. According to ER note he was intoxicated. No seizure activity since ER visit.   Patient denies shortness of breath, fever, cough and chest pain over the phone call    Day of Surgery:  Take a shower the day of or night before with antibacterial soap. Wear Clean/Comfortable clothing the morning of surgery Do not apply any deodorants/lotions.   Do not wear jewelry or makeup Do not wear lotions, powders, perfumes/colognes, or deodorant. Do not shave 48 hours prior to surgery.  Men may shave face and neck. Do not bring valuables to the hospital. Do not wear nail polish, gel polish, artificial nails, or any other type of covering on natural nails (fingers and toes) If you have artificial nails or gel coating that need to be removed by a nail salon, please have this removed prior to surgery. Artificial nails or gel coating may interfere with anesthesia's ability to adequately monitor your vital signs. Remember to  brush your teeth WITH YOUR REGULAR TOOTHPASTE.

## 2023-07-31 NOTE — Anesthesia Preprocedure Evaluation (Addendum)
 Anesthesia Evaluation  Patient identified by MRN, date of birth, ID band Patient awake    Reviewed: Allergy & Precautions, NPO status , Patient's Chart, lab work & pertinent test results  History of Anesthesia Complications Negative for: history of anesthetic complications  Airway Mallampati: I  TM Distance: >3 FB Neck ROM: Full    Dental  (+) Edentulous Lower, Edentulous Upper   Pulmonary Current Smoker and Patient abstained from smoking.   Pulmonary exam normal        Cardiovascular hypertension, Pt. on medications + DVT  Normal cardiovascular exam     Neuro/Psych Seizures -,  CVA  negative psych ROS   GI/Hepatic negative GI ROS,,,(+)     substance abuse  alcohol use  Endo/Other  negative endocrine ROS    Renal/GU negative Renal ROS  negative genitourinary   Musculoskeletal negative musculoskeletal ROS (+)    Abdominal   Peds  Hematology  (+) Blood dyscrasia (Hgb 11.3), anemia   Anesthesia Other Findings right inguinal abscess   Reproductive/Obstetrics negative OB ROS                             Anesthesia Physical Anesthesia Plan  ASA: 3  Anesthesia Plan: General   Post-op Pain Management: Tylenol  PO (pre-op)*   Induction: Intravenous  PONV Risk Score and Plan: 1 and Treatment may vary due to age or medical condition and Ondansetron   Airway Management Planned: LMA  Additional Equipment: None  Intra-op Plan:   Post-operative Plan: Extubation in OR  Informed Consent: I have reviewed the patients History and Physical, chart, labs and discussed the procedure including the risks, benefits and alternatives for the proposed anesthesia with the patient or authorized representative who has indicated his/her understanding and acceptance.     Dental advisory given  Plan Discussed with: CRNA  Anesthesia Plan Comments: (See PAT note written 07/31/2023 by Allison Zelenak,  PA-C. Alcohol abuse with history of seizures, at least some in part due to alcohol withdrawal but also with prior CVA history. No longer taking Keppra .   )        Anesthesia Quick Evaluation

## 2023-07-31 NOTE — Progress Notes (Signed)
 Anesthesia Chart Review: Dominic Carter  Case: [id number] Date/Time: 08/03/23 0715   Procedure: INCISION AND DRAINAGE, ABSCESS - TAP BLOCK excision right groin wound and abscess drainage   Anesthesia type: General   Diagnosis: Abscess of groin [L02.214]   Pre-op diagnosis: right groin abscess   [facility]: [facility] ROOM 02 / [facility]   Surgeons: Enid Harry, MD       DISCUSSION: Patient is a 68 year old male scheduled for the above procedure.  History includes smoking, HTN, dyslipidemia, CVA (TIA? 02/2016; remote right MCA and cerebellum infarct 01/20/20 MRI), right BKA (~ 20 years ago), PAD (bilateral iliac occlusion), alcohol abuse (with history of DT seizures), bilateral inguinal hernia repair (2016, developed right groin abscess 01/2022)  He was admitted to [facility] 01/19/20-01/27/20 for new onset seizure in setting of alcohol abuse and prior CVA. No acute intracranial abnormality on brain MRI. Hospital course complicated by alcohol withdrawal and alcohol hepatitis. Fatty liver on US . 01/20/20 TTE showed Echo: EF 40%, anterior septum, mid inferoseptal segment and basal inferoseptal segment hypokinesis. No additional cardiac testing ordered. Carotid US  without significant ICA stenosis. EEG showed no seizure or epileptiform discharge, study suggestive of cortical dysfunction in the right temporal region. He was started on Keprra. Cessation of alcohol advised. He was discharged to SNF.   [facility] admission 01/05/22-01/10/22 for right groin abscess. Also with left pyelonephritis with left renal abscess, s/p aspiration. S/p I&D right groin abscess 01/06/22. Cultures grew E coli, Strep anginosis and Bacteroides. S/p IV Zosyn  followed by oral Keflex  and Flagyl  x 14 days. Recurrent right inguinal pain and drainage with ED visit 08/03/22. No abscess on imaging but with iliac occlusions. He was discharged on doxycycline . He had vascular surgery follow-up with Dr. Vikki Graves with continued monitory and statin  therapy recommended. He continued to have right groin drainage. Seen by ID Dr. Emmalene Hare on 12/02/22. Augmentin  x 2 weeks recommended and referred to surgery for repeat I&D. He initially wanted to try drainage by IR. S/p aspiration 04/02/23 with cultures + moderate E. coli, few Morganella morganii, moderate Streptococcus group C, few Bacteroides fragilis, beta-lactamase positive. Surgery I&D recommended at 04/16/23 and 05/22/23 visit with Dr. Delane Fear.   ED visit 05/30/23 for seizure in setting of quit drinking 2 weeks, so alcohol withdrawal suspected.  Ethyl alcohol < 10. He was treated for hypocalcemia and hypokalemia and discharged home. His seizure history dates back to 01/19/20. He has been seen in-patient by neurologist Ann Keto, MD in 01/2020 and 08/2020. Although some seizures felt related to alcohol withdrawal, at least one event was in setting of elevated ethyl alcohol levels. Prior stroke, toxic metabolic encephalopathy, metabolic derangements, and medication non-compliance also possible contributing factors. Continue Keppra  recommended in 2022. Since then he had stopped Keppra  on his own and told his PCP Dr. Starr Eddy, who referred him back to neurology. He was since once on 12/17/22 ([facility]) by Naomia Bachelor, MD.  At that time, he reported not having a seizure for over a years after stopping drinking alcohol, so she agreed that he could continue trial of no anti-seizure medications. Since then had ED visits for seizure and alcohol intoxication and has not had neurology follow-up.   ED visit on 07/13/23 with fall with laceration to elbow and upper arm, s/p suture repair.  He refused tetanus shot.  Ethyl alcohol level 381 on 07/13/23. Levetiracetam  level < 2.0 on 07/13/23. Head and c-spine images without acute findings. Cardiac monitor while in ED showed SR. EKG without  ischemic changes.  He did not want Librium  taper because he was not ready to quit drinking.  He reported that his last alcohol  beverage was on 07/13/23. As above, previously neurology in-patient evaluations thought in additional to alcohol withdrawal, there may be other factors contributing to his seizures, but anti-seizure medication were not resumed at his out-patient evaluation in September. He is not on Keppra . Last seizure in February did seem to correspond with alcohol withdrawal based on levels.   Abnormal echo in 2021. He is not followed by cardiology. He did have recent primary care follow-up. His niece provides support. He is at risk for recurrent seizures given abstinence for alcohol recently and no longer being on anti-seizure medications. He unfortunately has a persistent right groin abscess which has not resolved with more conservative measures. Both ID and general surgery are recommending operative I&D. Discussed with anesthesiologist Stoltzfus, Theotis Flake, DO.  Anesthesia team to assess on the day of surgery. Definitive plan at that time.    VS:  BP Readings from Last 3 Encounters:  07/29/23 123/68  07/14/23 (!) 168/85  05/30/23 (!) 145/87   Pulse Readings from Last 3 Encounters:  07/29/23 80  07/14/23 95  05/30/23 88     PROVIDERS: Charlsie Cool, MD is PCP.  He had visit on 07/15/23 for HTN follow-up and requested a power wheelchair. Continue losartan and amlodipine .   Angela Kell, MD is vascular surgeon. 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 Remote history of right BKA. He was asymptomatic from PAD with no tissue loss. Smoking cessation advised, Continue ASA and  statin. Followup in 1 year.   Carrolyn Clan, MD is neurologist ([facility])  LABS: Most recent labs in [facility] include: Lab Results  Component Value Date   WBC 5.9 07/13/2023   HGB 13.7 07/13/2023   HCT 43.4 07/13/2023   PLT 193 07/13/2023   GLUCOSE 92 07/13/2023   ALT 26 07/13/2023   AST 42 (H) 07/13/2023   NA 147 (H) 07/13/2023   K 3.8 07/13/2023   CL 107 07/13/2023   CREATININE 0.73 07/13/2023   BUN  <5 (L) 07/13/2023   CO2 26 07/13/2023   INR 1.1 04/02/2023  A1c 5.5% on 01/14/23.   OTHER: EEG 09/02/21: IMPRESSION: This study is suggestive of cortical dysfunction in right temporal region likely secondary to underlying stroke.  No seizures or epileptiform discharges were seen throughout the recording.    IMAGES: CT Head & C-spine 07/13/23: IMPRESSION: 1. No CT evidence for acute intracranial abnormality. Atrophy and chronic small vessel ischemic changes of the white matter. Remote infarcts as discussed above. 2. Straightening of the cervical spine with moderate multilevel degenerative change. No acute osseous abnormality.   CT Abd/pelvis 03/31/23: IMPRESSION: 1. Redemonstration of patient's known chronic collection in the bilateral inguinal region with extension up to the skin surface in the right paramedian anterior abdominal wall. There are linear hyperattenuating structures which may represent hernia mesh. Overall, the size of collection is slightly smaller than the prior exam from 01/22/2023. 2. 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 3. Redemonstration of irregular urinary bladder wall thickening and mucosal hyperattenuation, similar to the prior study. Correlate clinically and with urinalysis to exclude superimposed cystitis. 4. Multiple other nonacute observations, as described above.   CT Chest LCS 01/29/23 ([facility]): OVERALL LUNG-RADS CATEGORY: 1, S  BASED ON LESION: ID no pulmonary nodules identified.  MANAGEMENT RECOMMENDATION:  Continue annual screening with low-dose chest CT.  FOLLOW-UP DATE: 01/30/2024  S FINDINGS: Moderate coronary artery calcifications. Mild centrilobular and paraseptal emphysema.    EKG: 07/13/23: Normal sinus rhythm Minimal voltage criteria for LVH, may be normal variant ( Sokolow-Lyon ) Septal infarct , age undetermined Abnormal  ECG Confirmed by Iva Mariner [phone]) on 07/13/2023 3:23:24 PM   CV: Echo 01/20/20: IMPRESSIONS   1. Left ventricular ejection fraction, by estimation, is 40%. The left  ventricle has mildly decreased function. The left ventricle demonstrates  regional wall motion abnormalities (The anterior septum, mid inferoseptal segment, and basal inferoseptal segment are hypokinetic.). There is mild left  ventricular hypertrophy. Left ventricular diastolic parameters are  consistent with Grade I diastolic dysfunction (impaired relaxation).   2. Right ventricular systolic function is mildly reduced. The right  ventricular size is normal. Tricuspid regurgitation signal is inadequate  for assessing PA pressure.   3. The mitral valve is normal in structure. No evidence of mitral valve  regurgitation. No evidence of mitral stenosis.   4. The aortic valve is grossly normal. Aortic valve regurgitation is  mild. No aortic stenosis is present.   5. The inferior vena cava is normal in size with greater than 50%  respiratory variability, suggesting right atrial pressure of 3 mmHg.  - Conclusion(s)/Recommendation(s): No intracardiac source of embolism  detected on this transthoracic study. A transesophageal echocardiogram is  recommended to exclude cardiac source of embolism if clinically indicated.  No left ventricular mural or apical  thrombus/thrombi.    US  Carotid 01/20/20: Summary:  - Right Carotid: Velocities in the right ICA are consistent with a 1-39%  stenosis.  - Left Carotid: Velocities in the left ICA are consistent with a 1-39%  stenosis.  - Vertebrals: Bilateral vertebral arteries demonstrate antegrade flow.    Past Medical History:  Diagnosis Date   DVT (deep venous thrombosis) (HCC)    Dyslipidemia    ETOH abuse    Hx of BKA, right (HCC)    Hypertension    Seizure disorder (HCC)    Stroke (HCC) 2022    Past Surgical History:  Procedure Laterality Date   BELOW KNEE LEG AMPUTATION  Right    HERNIA REPAIR Right 2022   IRRIGATION AND DEBRIDEMENT ABSCESS Right 01/06/2022   Procedure: IRRIGATION AND DEBRIDEMENT ABSCESS;  Surgeon: Enid Harry, MD;  Location: [facility] OR;  Service: General;  Laterality: Right;    MEDICATIONS: No current facility-administered medications for this encounter.    amLODipine  (NORVASC ) 10 MG tablet   aspirin  EC 81 MG tablet   levETIRAcetam  (KEPPRA ) 500 MG tablet   losartan (COZAAR) 25 MG tablet   pravastatin  (PRAVACHOL ) 20 MG tablet    Ella Gun, PA-C Surgical Short Stay/Anesthesiology [facility] Phone [phone] [facility] Phone [phone] 07/31/2023 2:00 PM

## 2023-08-03 ENCOUNTER — Other Ambulatory Visit: Payer: Self-pay

## 2023-08-03 ENCOUNTER — Encounter (HOSPITAL_COMMUNITY)
Admission: RE | Disposition: A | Discharge status: Home / Self Care | Payer: Self-pay | Source: Home / Self Care | Attending: General Surgery

## 2023-08-03 ENCOUNTER — Inpatient Hospital Stay (HOSPITAL_COMMUNITY): Payer: Self-pay | Admitting: Vascular Surgery

## 2023-08-03 ENCOUNTER — Inpatient Hospital Stay (HOSPITAL_COMMUNITY)
Admission: RE | Admit: 2023-08-03 | Discharge: 2023-08-03 | DRG: 982 | Disposition: A | Discharge status: Home / Self Care | Attending: General Surgery | Admitting: General Surgery

## 2023-08-03 ENCOUNTER — Encounter (HOSPITAL_COMMUNITY): Payer: Self-pay | Admitting: General Surgery

## 2023-08-03 ENCOUNTER — Encounter (HOSPITAL_COMMUNITY): Payer: Self-pay | Admitting: *Deleted

## 2023-08-03 ENCOUNTER — Ambulatory Visit (HOSPITAL_COMMUNITY): Admission: EM | Admit: 2023-08-03 | Discharge: 2023-08-03 | Disposition: A | Discharge status: Home / Self Care

## 2023-08-03 DIAGNOSIS — Z882 Allergy status to sulfonamides status: Secondary | ICD-10-CM | POA: Diagnosis not present

## 2023-08-03 DIAGNOSIS — F17210 Nicotine dependence, cigarettes, uncomplicated: Secondary | ICD-10-CM

## 2023-08-03 DIAGNOSIS — I10 Essential (primary) hypertension: Secondary | ICD-10-CM

## 2023-08-03 DIAGNOSIS — Z89511 Acquired absence of right leg below knee: Secondary | ICD-10-CM

## 2023-08-03 DIAGNOSIS — T8579XA Infection and inflammatory reaction due to other internal prosthetic devices, implants and grafts, initial encounter: Secondary | ICD-10-CM | POA: Diagnosis present

## 2023-08-03 DIAGNOSIS — Z8673 Personal history of transient ischemic attack (TIA), and cerebral infarction without residual deficits: Secondary | ICD-10-CM

## 2023-08-03 DIAGNOSIS — F1021 Alcohol dependence, in remission: Secondary | ICD-10-CM | POA: Diagnosis present

## 2023-08-03 DIAGNOSIS — Z4802 Encounter for removal of sutures: Secondary | ICD-10-CM | POA: Diagnosis not present

## 2023-08-03 DIAGNOSIS — Z79899 Other long term (current) drug therapy: Secondary | ICD-10-CM | POA: Diagnosis not present

## 2023-08-03 DIAGNOSIS — Y832 Surgical operation with anastomosis, bypass or graft as the cause of abnormal reaction of the patient, or of later complication, without mention of misadventure at the time of the procedure: Secondary | ICD-10-CM | POA: Diagnosis present

## 2023-08-03 DIAGNOSIS — L02214 Cutaneous abscess of groin: Secondary | ICD-10-CM

## 2023-08-03 DIAGNOSIS — Z91018 Allergy to other foods: Secondary | ICD-10-CM

## 2023-08-03 DIAGNOSIS — E7800 Pure hypercholesterolemia, unspecified: Secondary | ICD-10-CM | POA: Diagnosis present

## 2023-08-03 DIAGNOSIS — Z87892 Personal history of anaphylaxis: Secondary | ICD-10-CM

## 2023-08-03 DIAGNOSIS — Z880 Allergy status to penicillin: Secondary | ICD-10-CM | POA: Diagnosis not present

## 2023-08-03 HISTORY — PX: INCISION AND DRAINAGE ABSCESS: SHX5864

## 2023-08-03 SURGERY — INCISION AND DRAINAGE, ABSCESS
Anesthesia: General | Site: Groin | Laterality: Right

## 2023-08-03 MED ORDER — OXYCODONE HCL 5 MG/5ML PO SOLN: 5.0000 mg | Freq: Once | ORAL | Status: AC | PRN

## 2023-08-03 MED ORDER — HYDROMORPHONE HCL 1 MG/ML IJ SOLN
INTRAMUSCULAR | Status: AC
  Administered 2023-08-03: 0.5 mg via INTRAVENOUS
  Filled 2023-08-03: qty 1

## 2023-08-03 MED ORDER — OXYCODONE HCL 5 MG PO TABS: 5.0000 mg | ORAL_TABLET | Freq: Once | ORAL | 0 refills | Status: DC | PRN

## 2023-08-03 MED ORDER — LIDOCAINE 2% (20 MG/ML) 5 ML SYRINGE
INTRAMUSCULAR | Status: DC | PRN
  Administered 2023-08-03: 40 mg via INTRAVENOUS

## 2023-08-03 MED ORDER — ENSURE PRE-SURGERY PO LIQD: 296.0000 mL | Freq: Once | ORAL | Status: DC

## 2023-08-03 MED ORDER — MIDAZOLAM HCL 2 MG/2ML IJ SOLN
INTRAMUSCULAR | Status: AC
  Filled 2023-08-03: qty 2

## 2023-08-03 MED ORDER — DEXAMETHASONE SODIUM PHOSPHATE 10 MG/ML IJ SOLN
INTRAMUSCULAR | Status: AC
  Filled 2023-08-03: qty 1

## 2023-08-03 MED ORDER — PROPOFOL 10 MG/ML IV BOLUS
INTRAVENOUS | Status: AC
  Filled 2023-08-03: qty 20

## 2023-08-03 MED ORDER — BUPIVACAINE-EPINEPHRINE (PF) 0.25% -1:200000 IJ SOLN
INTRAMUSCULAR | Status: DC | PRN
  Administered 2023-08-03: 10 mL

## 2023-08-03 MED ORDER — LACTATED RINGERS IV SOLN: INTRAVENOUS | Status: DC

## 2023-08-03 MED ORDER — BUPIVACAINE-EPINEPHRINE (PF) 0.25% -1:200000 IJ SOLN
INTRAMUSCULAR | Status: AC
  Filled 2023-08-03: qty 30

## 2023-08-03 MED ORDER — FENTANYL CITRATE (PF) 250 MCG/5ML IJ SOLN
INTRAMUSCULAR | Status: AC
  Filled 2023-08-03: qty 5

## 2023-08-03 MED ORDER — GLYCOPYRROLATE PF 0.2 MG/ML IJ SOSY
PREFILLED_SYRINGE | INTRAMUSCULAR | Status: AC
  Filled 2023-08-03: qty 1

## 2023-08-03 MED ORDER — AMISULPRIDE (ANTIEMETIC) 5 MG/2ML IV SOLN: 10.0000 mg | Freq: Once | INTRAVENOUS | Status: DC | PRN

## 2023-08-03 MED ORDER — EPHEDRINE SULFATE-NACL 50-0.9 MG/10ML-% IV SOSY
PREFILLED_SYRINGE | INTRAVENOUS | Status: DC | PRN
  Administered 2023-08-03 (×2): 5 mg via INTRAVENOUS

## 2023-08-03 MED ORDER — ONDANSETRON HCL 4 MG/2ML IJ SOLN
INTRAMUSCULAR | Status: AC
  Filled 2023-08-03: qty 2

## 2023-08-03 MED ORDER — CHLORHEXIDINE GLUCONATE CLOTH 2 % EX PADS: 6.0000 | MEDICATED_PAD | Freq: Once | CUTANEOUS | Status: DC

## 2023-08-03 MED ORDER — DOXYCYCLINE HYCLATE 100 MG PO TABS: 100.0000 mg | ORAL_TABLET | Freq: Two times a day (BID) | ORAL | 2 refills | Status: AC

## 2023-08-03 MED ORDER — CEFAZOLIN SODIUM-DEXTROSE 2-3 GM-%(50ML) IV SOLR: INTRAVENOUS | Status: DC | PRN

## 2023-08-03 MED ORDER — SODIUM CHLORIDE 0.9 % IV SOLN: 12.5000 mg | INTRAVENOUS | Status: DC | PRN

## 2023-08-03 MED ORDER — HYDROMORPHONE HCL 1 MG/ML IJ SOLN
0.2500 mg | INTRAMUSCULAR | Status: DC | PRN
  Administered 2023-08-03 (×2): 0.5 mg via INTRAVENOUS

## 2023-08-03 MED ORDER — ONDANSETRON HCL 4 MG/2ML IJ SOLN
INTRAMUSCULAR | Status: DC | PRN
  Administered 2023-08-03: 4 mg via INTRAVENOUS

## 2023-08-03 MED ORDER — OXYCODONE HCL 5 MG PO TABS
5.0000 mg | ORAL_TABLET | Freq: Once | ORAL | Status: AC | PRN
  Administered 2023-08-03: 5 mg via ORAL

## 2023-08-03 MED ORDER — SODIUM CHLORIDE 0.9 % IV SOLN
2.0000 g | INTRAVENOUS | Status: AC
  Administered 2023-08-03: 2 g via INTRAVENOUS
  Filled 2023-08-03: qty 20

## 2023-08-03 MED ORDER — FENTANYL CITRATE (PF) 250 MCG/5ML IJ SOLN
INTRAMUSCULAR | Status: DC | PRN
  Administered 2023-08-03: 25 ug via INTRAVENOUS
  Administered 2023-08-03: 50 ug via INTRAVENOUS

## 2023-08-03 MED ORDER — DEXMEDETOMIDINE HCL IN NACL 80 MCG/20ML IV SOLN
INTRAVENOUS | Status: DC | PRN
  Administered 2023-08-03: 4 ug via INTRAVENOUS

## 2023-08-03 MED ORDER — ACETAMINOPHEN 500 MG PO TABS
1000.0000 mg | ORAL_TABLET | ORAL | Status: AC
  Administered 2023-08-03: 1000 mg via ORAL
  Filled 2023-08-03: qty 2

## 2023-08-03 MED ORDER — ONDANSETRON HCL 4 MG/2ML IJ SOLN: INTRAMUSCULAR | Status: DC | PRN

## 2023-08-03 MED ORDER — PHENYLEPHRINE 80 MCG/ML (10ML) SYRINGE FOR IV PUSH (FOR BLOOD PRESSURE SUPPORT)
PREFILLED_SYRINGE | INTRAVENOUS | Status: DC | PRN
  Administered 2023-08-03 (×3): 80 ug via INTRAVENOUS

## 2023-08-03 MED ORDER — ORAL CARE MOUTH RINSE: 15.0000 mL | Freq: Once | OROMUCOSAL | Status: AC

## 2023-08-03 MED ORDER — DEXAMETHASONE SODIUM PHOSPHATE 10 MG/ML IJ SOLN
INTRAMUSCULAR | Status: DC | PRN
  Administered 2023-08-03: 5 mg via INTRAVENOUS

## 2023-08-03 MED ORDER — 0.9 % SODIUM CHLORIDE (POUR BTL) OPTIME
TOPICAL | Status: DC | PRN
  Administered 2023-08-03: 1000 mL

## 2023-08-03 MED ORDER — PROPOFOL 10 MG/ML IV BOLUS
INTRAVENOUS | Status: DC | PRN
  Administered 2023-08-03 (×2): 20 mg via INTRAVENOUS
  Administered 2023-08-03: 110 mg via INTRAVENOUS
  Administered 2023-08-03: 20 mg via INTRAVENOUS

## 2023-08-03 MED ORDER — OXYCODONE HCL 5 MG PO TABS
ORAL_TABLET | ORAL | Status: AC
  Filled 2023-08-03: qty 1

## 2023-08-03 MED ORDER — OXYCODONE HCL 5 MG PO TABS: 5.0000 mg | ORAL_TABLET | Freq: Four times a day (QID) | ORAL | 0 refills | Status: DC | PRN

## 2023-08-03 MED ORDER — CHLORHEXIDINE GLUCONATE 0.12 % MT SOLN
15.0000 mL | Freq: Once | OROMUCOSAL | Status: AC
  Administered 2023-08-03: 15 mL via OROMUCOSAL
  Filled 2023-08-03: qty 15

## 2023-08-03 SURGICAL SUPPLY — 30 items
BAG COUNTER SPONGE SURGICOUNT (BAG) ×1 IMPLANT
BNDG GAUZE DERMACEA FLUFF 4 (GAUZE/BANDAGES/DRESSINGS) IMPLANT
CANISTER SUCT 3000ML PPV (MISCELLANEOUS) ×1 IMPLANT
COVER SURGICAL LIGHT HANDLE (MISCELLANEOUS) ×1 IMPLANT
DRAIN PENROSE 0.5X18 (DRAIN) IMPLANT
DRAPE LAPAROSCOPIC ABDOMINAL (DRAPES) IMPLANT
DRAPE LAPAROTOMY 100X72 PEDS (DRAPES) IMPLANT
ELECT CAUTERY BLADE 6.4 (BLADE) ×1 IMPLANT
ELECTRODE REM PT RTRN 9FT ADLT (ELECTROSURGICAL) ×1 IMPLANT
GAUZE PAD ABD 8X10 STRL (GAUZE/BANDAGES/DRESSINGS) IMPLANT
GAUZE SPONGE 4X4 12PLY STRL (GAUZE/BANDAGES/DRESSINGS) IMPLANT
GLOVE BIO SURGEON STRL SZ7 (GLOVE) ×1 IMPLANT
GLOVE BIOGEL PI IND STRL 7.5 (GLOVE) ×1 IMPLANT
GOWN STRL REUS W/ TWL LRG LVL3 (GOWN DISPOSABLE) ×2 IMPLANT
KIT BASIN OR (CUSTOM PROCEDURE TRAY) ×1 IMPLANT
KIT TURNOVER KIT B (KITS) ×1 IMPLANT
NDL HYPO 25GX1X1/2 BEV (NEEDLE) IMPLANT
NEEDLE HYPO 25GX1X1/2 BEV (NEEDLE) ×1 IMPLANT
NS IRRIG 1000ML POUR BTL (IV SOLUTION) ×1 IMPLANT
PACK GENERAL/GYN (CUSTOM PROCEDURE TRAY) ×1 IMPLANT
PAD ARMBOARD POSITIONER FOAM (MISCELLANEOUS) ×1 IMPLANT
PENCIL SMOKE EVACUATOR (MISCELLANEOUS) ×1 IMPLANT
SUT ETHILON 2 0 FS 18 (SUTURE) IMPLANT
SUT VIC AB 2-0 SH 27X BRD (SUTURE) IMPLANT
SUT VIC AB 3-0 SH 27X BRD (SUTURE) IMPLANT
SWAB COLLECTION DEVICE MRSA (MISCELLANEOUS) IMPLANT
SWAB CULTURE ESWAB REG 1ML (MISCELLANEOUS) IMPLANT
SYR CONTROL 10ML LL (SYRINGE) IMPLANT
TOWEL GREEN STERILE (TOWEL DISPOSABLE) ×1 IMPLANT
TOWEL GREEN STERILE FF (TOWEL DISPOSABLE) ×1 IMPLANT

## 2023-08-03 NOTE — Interval H&P Note (Signed)
 History and Physical Interval Note:  08/03/2023 6:55 AM  Dominic Carter  has presented today for surgery, with the diagnosis of right groin abscess.  The various methods of treatment have been discussed with the patient and family. After consideration of risks, benefits and other options for treatment, the patient has consented to  Procedure(s) with comments: INCISION AND DRAINAGE, ABSCESS (N/A) - TAP BLOCK excision right groin wound and abscess drainage as a surgical intervention.  The patient's history has been reviewed, patient examined, no change in status, stable for surgery.  I have reviewed the patient's chart and labs.  Questions were answered to the patient's satisfaction.     Dominic Carter

## 2023-08-03 NOTE — Op Note (Signed)
 Preoperative diagnosis: Prior right inguinal hernia repair with chronic right groin abscess Postoperative diagnosis: Same as above, infected mesh Procedure: Incision and drainage of right groin abscess, removal of mesh Surgical Dr. Donavan Fuchs Anesthesia: General Estimated blood loss: Less than 50 cc Complications: None Drains: Penrose to abscess cavity Specimens: 1.  Cultures to microbiology 2.  Chronic right groin tissue consistent with abscess Sponge a count was correct completion Disposition recovery stable addition  Indications: This is a 68 year old male who underwent a right inguinal hernia pair at an outside hospital in 2016.  In 2023 he presented here with a large right inguinal abscess.  This was drained at that time.  Culture grew moderate E. coli at that time.  He was discharged on antibiotics.  He returned in 2024 with the same.  He had an interventional radiology placed drain.  This is still persistent and he had I eventually talked about going to the operating room.  Procedure: After informed consent was obtained he was taken to the operating room.  He was given antibiotics.  SCDs were in place.  He was placed under general anesthesia without complication.  He was prepped and draped in a standard sterile surgical fashion.  Surgical timeout was then performed.  I infiltrated Marcaine  in his right groin.  I made an elliptical incision to encompass the sinus tracts that were draining purulence.  I carried this down to the level of the fascia.  This was very difficult given the scar tissue he had.  I removed the abscess cavity superficially.  This tracked into the preperitoneal space.  I was able to evacuate more purulence from this deep space.  The mesh was able to be identified through this hole.  I did enlarge it to be able to access it some more.  I then removed what appeared to be a good portion of his mesh through this small hole.  There was no more mesh that really I could  visualize.  To do more I think would we have been counterproductive at this point.  I irrigated this copiously.  I placed a Penrose drain through a separate stab incision into the deep cavity in the preperitoneal space where the mesh was.  I then elected to close the incision as I had good drainage with 3-0 Vicryl and staples.  Dressings were placed.  He tolerated this well and was transferred to recovery stable.

## 2023-08-03 NOTE — ED Triage Notes (Signed)
 Pts niece states he needs sutures out of his right elbow that was placed 07/13/2023.

## 2023-08-03 NOTE — H&P (Signed)
 68 y.o. male who is s/p right inguinal hernia repair in 2016 in [facility] by Dr. Larwence Poet. He presented to the ER with right groin pain in Oct 2023. He had a CT scan showing a large right inguinal abscess with a component that appeared to be associated with the mesh as well as some subcutaneous extension. He also potentially had a left renal abscess. He was taken to the operating room 01/06/2022. Incision was made over the right inguinal abscess and immediate purulence and necrotic tissue was released. Culture was sent. Mesh was not seen and the cavity was packed. Culture grew moderate E. coli and strep anginosus. He underwent image guided aspiration for left kidney lesion but no fluid could be aspirated. Small amount of blood was sent for culture, but no growth was seen. He was discharged on 01/10/2022 with Keflex  and Flagyl  x 14 days.   He was seen in the ER on 08/03/2022 with right inguinal pain and drainage. CT scan was obtained which showed no evidence of abscess but showed interval development of long segment occlusion through the right external iliac artery and common femoral artery (s/p right BKA). 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 Dr. Edgardo Goodwill from vascular surgery was consulted but no immediate intervention was recommended. Follow-up in their office was recommended. He was discharged with doxycycline .  He continues to have drainage. I sent him for a repeat CT of his pelvis. This still shows residual peripherally enhancing extraperitoneal collections that look like to drain to the skin surface. These are bigger. The largest measurement is 54 mm. There is some abnormal enhancement of right along the right iliac us . There are some other changes that are also present that are chronic. Latest aspiration in December was only about 1 cm  Review of Systems: A complete review of systems was obtained from the patient. I  have reviewed this information and discussed as appropriate with the patient. See HPI as well for other ROS.  Review of Systems  All other systems reviewed and are negative.   Medical History: Past Medical History:  Diagnosis Date  ETOH abuse  Peripheral vascular disease (CMS-HCC)  Tobacco abuse   Patient Active Problem List  Diagnosis  Inguinal abscess   Past Surgical History:  Procedure Laterality Date  IRRIGATION AND DEBRIDEMENT ABSCESS 01/2022  Dr. Delane Fear  Right BKA    Allergies  Allergen Reactions  Penicillins Anaphylaxis and Rash  Sulfa  (Sulfonamide Antibiotics) Rash  Castor Oil Nausea And Vomiting  Other Nausea And Vomiting and Other (See Comments)  Boiled Fat back meat   Current Outpatient Medications on File Prior to Visit  Medication Sig Dispense Refill  amLODIPine  (NORVASC ) 10 MG tablet 1 (one) Tablet daily for high blood pressure  famotidine  (PEPCID ) 40 MG tablet Take 40 mg by mouth once daily  folic acid  (FOLVITE ) 1 MG tablet Take 1 mg by mouth once daily  levETIRAcetam  (KEPPRA ) 500 MG tablet Take 500 mg by mouth 2 (two) times daily  pravastatin  (PRAVACHOL ) 20 MG tablet 1 (one) Tablet in evening for high cholesterol   No current facility-administered medications on file prior to visit.   Family History  Problem Relation Age of Onset  Cancer Mother  Cirrhosis Father  Cancer Brother    Social History   Tobacco Use  Smoking Status Every Day  Current packs/day: 1.00  Types: Cigarettes  Smokeless Tobacco Not on file  Marital status: Single  Tobacco  Use  Smoking status: Every Day  Current packs/day: 1.00  Types: Cigarettes  Substance and Sexual Activity  Alcohol use: No  Comment: quit around nov 2013 - used to drink heavily.  Drug use: Never    Objective:   Physical Exam Vitals reviewed.  Constitutional:  Appearance: Normal appearance.  Abdominal:  Palpations: Abdomen is soft.  Tenderness: There is no abdominal tenderness.   Comments: Right groin wound with granulation tissue and purulence draining Neurological:  Mental Status: He is alert.   Assessment and Plan:   Groin abscess  Right groin abscess drainage and excision of chronically infected wound  Discussed surgery, risks , recovery and possibility of nonhealing wound postop. Will schedule

## 2023-08-03 NOTE — Discharge Instructions (Signed)
 [facility]  POST OP INSTRUCTIONS    A  prescription for pain medication may be given to you upon discharge.  Take your pain medication as prescribed, if needed.  If narcotic pain medicine is not needed, then you may take acetaminophen  (Tylenol ), naprosyn  (Alleve) or ibuprofen (Advil) as needed. Take your usually prescribed medications unless otherwise directed. If you need a refill on your pain medication, please contact your pharmacy.  They will contact our office to request authorization. Prescriptions will not be filled after 5 pm or on week-ends. You should follow a light diet the first 24 hours after arrival home, such as soup and crackers, etc.  Be sure to include lots of fluids daily.  Resume your normal diet the day after surgery. Most patients will experience some swelling and bruising around the groin and scrotum.  Ice packs and reclining will help.  Swelling and bruising can take several days to resolve.  It is common to experience some constipation if taking pain medication after surgery.  Increasing fluid intake and taking a stool softener (such as Colace) will usually help or prevent this problem from occurring.  A mild laxative (Milk of Magnesia or Miralax) should be taken according to package directions if there are no bowel movements after 48 hours. Unless discharge instructions indicate otherwise, you may remove your bandages 48 hours after surgery, and you may shower at that time.  You may have steri-strips (small skin tapes) in place directly over the incision.  These strips should be left on the skin for 7-10 days and will come off on their own.  If your surgeon used skin glue on the incision, you may shower in 24 hours.  The glue will flake off over the next 2-3 weeks.  Any sutures or staples will be removed at the office during your follow-up visit. ACTIVITIES:  You may resume regular (light) daily activities beginning the next day--such as daily self-care,  walking, climbing stairs--gradually increasing activities as tolerated.  You may have sexual intercourse when it is comfortable.  Refrain from any heavy lifting or straining until approved by your doctor. You may drive when you are no longer taking prescription pain medication, you can comfortably wear a seatbelt, and you can safely maneuver your car and apply brakes. RETURN TO WORK:  __________________________________________________________ Elene Griffes should see your doctor in the office for a follow-up appointment approximately 2-3 weeks after your surgery.  Make sure that you call for this appointment within a day or two after you arrive home to insure a convenient appointment time. OTHER INSTRUCTIONS:  __________________________________________________________________________________________________________________________________________________________________________________________  WHEN TO CALL YOUR DOCTOR: Fever over 101.0 Inability to urinate Nausea and/or vomiting Extreme swelling or bruising Continued bleeding from incision. Increased pain, redness, or drainage from the incision  The clinic staff is available to answer your questions during regular business hours.  Please don't hesitate to call and ask to speak to one of the nurses for clinical concerns.  If you have a medical emergency, go to the nearest emergency room or call 911.  A surgeon from [facility] is always on call at the hospital  The drain will have some output just keep it covered.  [address], [address], [place], [place]  [address] ?  [address] [address], [place], [place]   [address] [phone] ? [phone] ? FAX [phone] Web site: [email or web address]

## 2023-08-03 NOTE — ED Provider Notes (Signed)
 [facility]    CSN: [id number] Arrival date & time: 08/03/23  1153      History   Chief Complaint Chief Complaint  Patient presents with   Suture / Staple Removal    HPI Dominic Carter is a 68 y.o. male.   Patient presents to clinic today for suture removal. He had a fall from his wheelchair d/t ETOH intoxication on 4/7 where he had a laceration to the right elbow repaired with 6 sutures. Advised for suture removal in 7 days, around 4/14. Presents to clinic today on 4/28 for removal.   Had I&D today of infection mesh of the groin, did well with this. Came to UC afterwards to get sutures removed. Denies issues with healing, wound has been itching.   The history is provided by the patient and medical records.    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)    Dyslipidemia 01/30/2020   Hepatitis, alcoholic, acute 01/30/2020   Seizure disorder (HCC) 01/20/2020   ETOH abuse 01/20/2020   HTN (hypertension) 01/20/2020   Chronic ischemic right MCA stroke 01/20/2020    Past Surgical History:  Procedure Laterality Date   BELOW KNEE LEG AMPUTATION Right    HERNIA REPAIR Right 2022   IRRIGATION AND DEBRIDEMENT ABSCESS Right 01/06/2022   Procedure: IRRIGATION AND DEBRIDEMENT ABSCESS;  Surgeon: Enid Harry, MD;  Location: [facility] OR;  Service: General;  Laterality: Right;       Home Medications    Prior to Admission medications   Medication Sig Start Date End  Date Taking? Authorizing Provider  amLODipine  (NORVASC ) 10 MG tablet Take 10 mg by mouth daily. 08/18/19  Yes [provider]  aspirin  EC 81 MG tablet Take 81 mg by mouth daily. 10/08/22  Yes [provider]  doxycycline  (VIBRA -TABS) 100 MG tablet Take 1 tablet (100 mg total) by mouth 2 (two) times daily for 21 days. 08/03/23 08/24/23 Yes Enid Harry, MD  levETIRAcetam  (KEPPRA ) 500 MG tablet Take 1 tablet (500 mg total) by mouth 2 (two) times daily. 01/10/22  Yes Danford, Willis Harter, MD  losartan (COZAAR) 25 MG tablet Take 1 tablet by mouth daily. 07/15/23 01/11/24 Yes [provider]  oxyCODONE  (OXY IR/ROXICODONE ) 5 MG immediate release tablet Take 1 tablet (5 mg total) by mouth every 6 (six) hours as needed for severe pain (pain score 7-10) or moderate pain (pain score 4-6). 08/03/23  Yes Enid Harry, MD  pravastatin  (PRAVACHOL ) 20 MG tablet Take 1 tablet (20 mg total) by mouth daily. 01/10/22  Yes Danford, Willis Harter, MD    Family History Family History  Problem Relation Age of Onset   Hypertension Mother    Hypertension Sister    Hypertension Brother    Hypertension Maternal Grandmother    Hypertension Maternal Grandfather     Social History Social History   Tobacco Use   Smoking status: Every Day    Current packs/day:  1.00    Types: Cigarettes   Smokeless tobacco: Never  Vaping Use   Vaping status: Some Days  Substance Use Topics   Alcohol use: Yes    Alcohol/week: 3.0 standard drinks of alcohol    Types: 3 Cans of beer per week    Comment: 07/13/23 last drink   Drug use: No     Allergies   Sulfa  antibiotics, Castor oil, and Other   Review of Systems Review of Systems  Per HPI  Physical Exam Triage Vital Signs ED Triage Vitals  Encounter Vitals Group     BP      Systolic BP Percentile      Diastolic BP Percentile      Pulse      Resp      Temp      Temp src      SpO2      Weight      Height      Head Circumference       Peak Flow      Pain Score      Pain Loc      Pain Education      Exclude from Growth Chart    No data found.  Updated Vital Signs BP 122/73 (BP Location: Left Arm)   Pulse 81   Temp 97.8 F (36.6 C) (Oral)   Resp 18   SpO2 95%   Visual Acuity Right Eye Distance:   Left Eye Distance:   Bilateral Distance:    Right Eye Near:   Left Eye Near:    Bilateral Near:     Physical Exam Vitals and nursing note reviewed.  Constitutional:      Appearance: Normal appearance.  HENT:     Head: Normocephalic and atraumatic.     Right Ear: External ear normal.     Left Ear: External ear normal.     Nose: Nose normal.     Mouth/Throat:     Mouth: Mucous membranes are moist.  Eyes:     Conjunctiva/sclera: Conjunctivae normal.  Cardiovascular:     Rate and Rhythm: Normal rate.  Pulmonary:     Effort: Pulmonary effort is normal. No respiratory distress.  Musculoskeletal:        General: Normal range of motion.  Neurological:     General: No focal deficit present.     Mental Status: He is alert.  Psychiatric:        Mood and Affect: Mood normal.      UC Treatments / Results  Labs (all labs ordered are listed, but only abnormal results are displayed) Labs Reviewed - No data to display  EKG   Radiology No results found.  Procedures Procedures (including critical care time)  Medications Ordered in UC Medications - No data to display  Initial Impression / Assessment and Plan / UC Course  I have reviewed the triage vital signs and the nursing notes.  Pertinent labs & imaging results that were available during my care of the patient were reviewed by me and considered in my medical decision making (see chart for details).  Vitals and triage reviewed, patient is hemodynamically stable. Right elbow laceration appears to be well healed, edges well-approximated. Only 4/6 sutures remain, no obvious infection, warmth or drainage. Wound care discussed. POC, f/uc are and  return precautions given, no questions at this time.      Final Clinical Impressions(s) / UC Diagnoses   Final diagnoses:  Visit for suture removal  Discharge Instructions   None    ED Prescriptions   None    PDMP not reviewed this encounter.   Harlow Lighter, Ernie Sagrero  N, FNP 08/03/23 1240

## 2023-08-03 NOTE — Anesthesia Procedure Notes (Signed)
 Procedure Name: LMA Insertion Date/Time: 08/03/2023 7:34 AM  Performed by: Chandlar Staebell, CRNAPre-anesthesia Checklist: Patient identified, Emergency Drugs available, Suction available and Patient being monitored Patient Re-evaluated:Patient Re-evaluated prior to induction Oxygen Delivery Method: Circle System Utilized Preoxygenation: Pre-oxygenation with 100% oxygen Induction Type: IV induction Ventilation: Mask ventilation without difficulty LMA: LMA inserted LMA Size: 4.0 Number of attempts: 1 Airway Equipment and Method: Bite block Placement Confirmation: positive ETCO2 Tube secured with: Tape Dental Injury: Teeth and Oropharynx as per pre-operative assessment

## 2023-08-03 NOTE — Anesthesia Postprocedure Evaluation (Signed)
 Anesthesia Post Note  Patient: Dominic Carter  Procedure(s) Performed: INCISION AND DRAINAGE, ABSCESS (Right: Groin)     Patient location during evaluation: PACU Anesthesia Type: General Level of consciousness: awake and alert Pain management: pain level controlled Vital Signs Assessment: post-procedure vital signs reviewed and stable Respiratory status: spontaneous breathing, nonlabored ventilation and respiratory function stable Cardiovascular status: blood pressure returned to baseline and stable Postop Assessment: no apparent nausea or vomiting Anesthetic complications: no   No notable events documented.  Last Vitals:  Vitals:   08/03/23 0840 08/03/23 0930  BP:  111/72  Pulse: 72 69  Resp: 16 11  Temp:    SpO2: 100% 95%    Last Pain:  Vitals:   08/03/23 1000  TempSrc:   PainSc: 6                  Earvin Goldberg

## 2023-08-03 NOTE — Transfer of Care (Signed)
 Immediate Anesthesia Transfer of Care Note  Patient: Dominic Carter  Procedure(s) Performed: INCISION AND DRAINAGE, ABSCESS (Right: Groin)  Patient Location: PACU  Anesthesia Type:General  Level of Consciousness: drowsy  Airway & Oxygen Therapy: Patient Spontanous Breathing and Patient connected to face mask oxygen  Post-op Assessment: Report given to RN and Post -op Vital signs reviewed and stable  Post vital signs: Reviewed and stable  Last Vitals:  Vitals Value Taken Time  BP 134/77 08/03/23 0828  Temp    Pulse 82 08/03/23 0829  Resp 16 08/03/23 0829  SpO2 99 % 08/03/23 0829  Vitals shown include unfiled device data.  Last Pain:  Vitals:   08/03/23 5784  TempSrc:   PainSc: 0-No pain      Patients Stated Pain Goal: 0 (08/03/23 6962)  Complications: No notable events documented.

## 2023-08-04 ENCOUNTER — Encounter (HOSPITAL_COMMUNITY): Payer: Self-pay | Admitting: General Surgery

## 2023-08-04 LAB — SURGICAL PATHOLOGY

## 2023-08-06 LAB — AEROBIC/ANAEROBIC CULTURE W GRAM STAIN (SURGICAL/DEEP WOUND)

## 2023-08-11 ENCOUNTER — Encounter: Admitting: Physical Therapy

## 2023-08-13 ENCOUNTER — Telehealth: Payer: Self-pay | Admitting: Physical Therapy

## 2023-08-13 ENCOUNTER — Ambulatory Visit: Attending: Family Medicine | Admitting: Physical Therapy

## 2023-08-13 DIAGNOSIS — M6281 Muscle weakness (generalized): Secondary | ICD-10-CM | POA: Insufficient documentation

## 2023-08-13 DIAGNOSIS — R2681 Unsteadiness on feet: Secondary | ICD-10-CM | POA: Insufficient documentation

## 2023-08-13 DIAGNOSIS — R293 Abnormal posture: Secondary | ICD-10-CM | POA: Insufficient documentation

## 2023-08-13 NOTE — Telephone Encounter (Signed)
 Called pt's niece, [name], and left VM regarding pt no-showing today's scheduled PT appointment. Informed niece of clinic's no-show policy and of pt's next scheduled appointment. Provided clinic phone number in case pt needs to cancel his appointment for next week.   Dominic Carter E Ildefonso Keaney, PT, DPT

## 2023-08-18 ENCOUNTER — Encounter: Admitting: Physical Therapy

## 2023-08-20 ENCOUNTER — Encounter: Admitting: Physical Therapy

## 2023-08-25 ENCOUNTER — Ambulatory Visit: Admitting: Physical Therapy

## 2023-08-25 VITALS — BP 129/73 | HR 91

## 2023-08-25 DIAGNOSIS — R2681 Unsteadiness on feet: Secondary | ICD-10-CM

## 2023-08-25 DIAGNOSIS — M6281 Muscle weakness (generalized): Secondary | ICD-10-CM

## 2023-08-25 DIAGNOSIS — R293 Abnormal posture: Secondary | ICD-10-CM | POA: Diagnosis present

## 2023-08-25 NOTE — Therapy (Signed)
 OUTPATIENT PHYSICAL THERAPY PROSTHETICS TREATMENT - DISCHARGE SUMMARY   Patient Name: Dominic Carter MRN: [id number] DOB:Dec 12, 1955, 69 y.o., male Today's Date: 08/25/2023  PCP: Earlis Glimpse, NP REFERRING PROVIDER: Daved Eriksson, MD  PHYSICAL THERAPY DISCHARGE SUMMARY  Visits from Start of Care: 3  Current functional level related to goals / functional outcomes: Pt requires SBA w/use of rollator or RW and R BKA prosthetic    Remaining deficits: High fall risk, decreased activity tolerance, decreased safety awareness    Education / Equipment: HEP   Patient agrees to discharge. Patient goals were partially met. Patient is being discharged due to the patient's request.   END OF SESSION:  PT End of Session - 08/25/23 0934     Visit Number 3    Number of Visits 13    Date for PT Re-Evaluation 09/09/23    Authorization Type [employer] Dual Complete    PT Start Time 0934    PT Stop Time 1013    PT Time Calculation (min) 39 min    Equipment Utilized During Treatment Other (comment);Gait belt   R BKA prosthesis   Activity Tolerance Patient tolerated treatment well    Behavior During Therapy WFL for tasks assessed/performed               Past Medical History:  Diagnosis Date   DVT (deep venous thrombosis) (HCC)    Dyslipidemia    ETOH abuse    Hx of BKA, right (HCC)    Hypertension    Seizure disorder (HCC)    Stroke (HCC) 2022   Past Surgical History:  Procedure Laterality Date   BELOW KNEE LEG AMPUTATION Right    HERNIA REPAIR Right 2022   INCISION AND DRAINAGE ABSCESS Right 08/03/2023   Procedure: INCISION AND DRAINAGE, ABSCESS;  Surgeon: Enid Harry, MD;  Location: [facility] OR;  Service: General;  Laterality: Right;  TAP BLOCK excision right groin wound and abscess drainage   IRRIGATION AND DEBRIDEMENT ABSCESS Right 01/06/2022   Procedure: IRRIGATION AND DEBRIDEMENT ABSCESS;  Surgeon: Enid Harry, MD;  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)    Dyslipidemia 01/30/2020   Hepatitis, alcoholic, acute 01/30/2020   Seizure disorder (HCC) 01/20/2020   ETOH abuse 01/20/2020   HTN (hypertension) 01/20/2020   Chronic ischemic right MCA stroke 01/20/2020    ONSET DATE: 07/15/2023 (referral)  REFERRING DIAG: Z89.511 (ICD-10-CM) - Acquired absence of right leg below knee Z76.89 (ICD-10-CM) - Persons encountering health services in other specified circumstances Z01.89 (ICD-10-CM) - Encounter for other specified special examinations  THERAPY DIAG:  Unsteadiness on feet  Muscle weakness (generalized)  Abnormal posture  Rationale for Evaluation and Treatment: Rehabilitation  SUBJECTIVE:   SUBJECTIVE STATEMENT: Pt presents w/no AD. States he is sore from his suture removal yesterday. No falls since last session. Has been working on his HEP. Did get a taller RW, but has been using the rollator more. Would like to DC today.    Pt accompanied by: Niece, [name] (in lobby)   PERTINENT HISTORY: alcohol abuse, seizure disorder on Keppra , right BKA, DVT not currently anticoagulated, hypertension, R posterior MCA stroke   PAIN:  Are you having pain? No  PRECAUTIONS: Fall  RED FLAGS: None   WEIGHT BEARING RESTRICTIONS: No  FALLS: Has patient fallen in last 6 months? Yes. Number of falls at least 3  LIVING ENVIRONMENT: Lives with: lives with their family Lives in: House/apartment Home Access: Stairs to enter Home layout: One level Stairs: Yes: External: 4 steps; on right going up, on left going up, and can reach both Has following equipment at home: Single  point cane, Walker - 2 wheeled, and Wheelchair (manual)  PLOF: Requires assistive device for independence, Needs assistance with homemaking, and Needs assistance with gait  PATIENT GOALS: "Balance"  OBJECTIVE:  Note: Objective measures were completed at Evaluation unless otherwise noted.  DIAGNOSTIC FINDINGS: CT of head from 07/13/23   IMPRESSION: 1. No CT evidence for acute intracranial abnormality. Atrophy and chronic small vessel ischemic changes of the white matter. Remote infarcts as discussed above. 2. Straightening of the cervical spine with moderate multilevel degenerative change. No acute osseous abnormality.  COGNITION: Overall cognitive status: Impaired   SENSATION: Pt reports numbness/tingling in R residual limb   POSTURE: rounded shoulders, forward head, increased thoracic kyphosis, and weight shift right  LOWER EXTREMITY ROM:  Active ROM Right eval Left eval  Hip flexion    Hip extension    Hip abduction    Hip adduction    Hip internal rotation    Hip external rotation    Knee flexion    Knee extension    Ankle dorsiflexion    Ankle plantarflexion    Ankle inversion    Ankle eversion     (Blank rows = not tested)  LOWER EXTREMITY MMT: Tested in seated position   MMT Right eval Left eval  Hip flexion 4+ 5  Hip extension    Hip abduction 5 5  Hip adduction 5 5  Hip internal rotation    Hip external rotation    Knee flexion 5 5  Knee extension 5 5  Ankle dorsiflexion  5  Ankle plantarflexion    Ankle inversion    Ankle eversion    (Blank rows = not tested)  BED MOBILITY:  Independent per pt   TRANSFERS: Sit to stand: Modified independence Stand to sit: Modified independence   CURRENT PROSTHETIC WEAR ASSESSMENT: Patient is independent with: skin check, residual limb care, care of non-amputated limb, prosthetic cleaning, and ply sock cleaning Patient is dependent with: correct ply sock adjustment, proper wear schedule/adjustment, and  proper weight-bearing schedule/adjustment Donning prosthesis: Complete Independence Doffing prosthesis: Complete Independence Prosthetic wear tolerance: 3-4 hours/day, 7 days/week Prosthetic weight bearing tolerance: 15 minutes Edema: Pt denies swelling Residual limb condition: Did not assess  Prosthetic description: Pin lock  K code/activity level with prosthetic use: Level 2            VITALS  Vitals:   08/25/23 0936  BP: 129/73  Pulse: 91  TREATMENT:  Self-care/home management  Assessed BP (see above) and WNL Pt reports he is not wearing a sock on his RLE this date but was able to teach back proper sock management to therapist   Ther Act  Gait pattern: step through pattern, lateral hip instability, trunk flexed, and wide BOS Distance walked: 230' Assistive device utilized: Environmental consultant - 4 wheeled Level of assistance: SBA Comments: Assessed use of rollator to ensure this is safe for pt. Pt demonstrated good brake management but did require min cues to maintain close distance to device, but once cued, was able to manage rollator well. Encouraged pt to use an AD at all times as he is safest w/one, pt verbalized understanding.   Physical Performance   [facility] PT Assessment - 08/25/23 0951       Transfers   Five time sit to stand comments  25.34s   No UE support     Ambulation/Gait   Gait velocity 32.8' over 18.25s = 1.8 ft/s w/rollator   SBA     Berg Balance Test   Sit to Stand Able to stand without using hands and stabilize independently    Standing Unsupported Able to stand safely 2 minutes    Sitting with Back Unsupported but Feet Supported on Floor or Stool Able to sit safely and securely 2 minutes    Stand to Sit Sits safely with minimal use of hands    Transfers Able to transfer safely, minor use of hands    Standing Unsupported with Eyes Closed Able to stand  10 seconds safely    Standing Unsupported with Feet Together Needs help to attain position but able to stand for 30 seconds with feet together    From Standing, Reach Forward with Outstretched Arm Can reach forward >12 cm safely (5")    From Standing Position, Pick up Object from Floor Able to pick up shoe safely and easily    From Standing Position, Turn to Look Behind Over each Shoulder Looks behind from both sides and weight shifts well    Turn 360 Degrees Able to turn 360 degrees safely but slowly   7.93s to L   Standing Unsupported, Alternately Place Feet on Step/Stool Able to complete 4 steps without aid or supervision    Standing Unsupported, One Foot in Front Able to plae foot ahead of the other independently and hold 30 seconds    Standing on One Leg Tries to lift leg/unable to hold 3 seconds but remains standing independently    Total Score 44    Berg comment: Significant fall risk                PATIENT EDUCATION: Education details: Goal results, importance of use of AD at all times, reminder about sock management on RLE  Person educated: Patient Education method: Explanation, Demonstration, and Handouts Education comprehension: verbalized understanding, returned demonstration, and needs further education  HOME EXERCISE PROGRAM: Access Code: [email or web address] URL: [email or web address] Date: 07/29/2023 Prepared by: Burleigh Carp Ajee Heasley  Exercises - Standing Anterior Toe Taps  - 1 x daily - 7 x weekly - 3 sets - 10 reps  ASSESSMENT:  CLINICAL IMPRESSION: Emphasis of skilled PT session on LTG assessment and DC from PT. Pt has not been seen for PT for several weeks due to surgeries and medical complications. Despite this, pt still met 2 of 4 LTGs, improving his score on Berg and performing his HEP regularly. Pt continues to be a significant fall risk per his score on Berg and history  of falls, but has improved his single leg stability and narrow BOS since eval. Suspect  pt's surgical pain played a roll in his instability as well. Pt primarily using rollator for short household distances at SBA level and would benefit from a power chair for community mobility and independence. Pt satisfied w/current functional level and would like to DC this date as he did obtain a taller RW and is close to his baseline.    OBJECTIVE IMPAIRMENTS: Abnormal gait, decreased activity tolerance, decreased balance, decreased cognition, decreased knowledge of condition, decreased knowledge of use of DME, decreased mobility, difficulty walking, decreased ROM, decreased safety awareness, impaired sensation, impaired vision/preception, improper body mechanics, prosthetic dependency , and pain  ACTIVITY LIMITATIONS: carrying, lifting, squatting, hygiene/grooming, locomotion level, and caring for others  PARTICIPATION LIMITATIONS: meal prep, cleaning, laundry, driving, shopping, community activity, and yard work  PERSONAL FACTORS: Behavior pattern, Past/current experiences, Transportation, and 1-2 comorbidities: alcohol abuse and R BKA are also affecting patient's functional outcome.   REHAB POTENTIAL: Good  CLINICAL DECISION MAKING: Evolving/moderate complexity  EVALUATION COMPLEXITY: Moderate   GOALS: Goals reviewed with patient? Yes  SHORT TERM GOALS: Target date: 08/19/2023   Pt will perform initial HEP w/S* from niece for improved strength, balance, transfers and gait.  Baseline: not established on eval  Goal status: MET  2.  Pt will obtain proper RW for improved safety at home and reduced fall risk  Baseline: RW is too short  Goal status: NOT MET  3.  Pt will improve 5 x STS to less than or equal to 24 seconds to demonstrate improved functional strength and transfer efficiency.   Baseline: 27.78s w/BUE support; 25.34s no UE support (5/20) Goal status: NOT MET  4.  Pt will improve gait velocity to at least 2.2 ft/s w/LRAD and SBA for improved gait efficiency and  safety  Baseline: 1.9 ft/s w/RW and CGA; 1.8 ft/s w/rollator and SBA Goal status: NOT MET  5.  Pt will demonstrate independence w/sock management for reduced pain levels and improved functional use of prosthetic  Baseline:  Goal status: MET  6.  Berg to be assessed and LTG updated  Baseline:  Goal status: MET  LONG TERM GOALS: Target date: 09/02/2023   Pt will perform final HEP w/S* from niece for improved strength, balance, transfers and gait.  Baseline:  Goal status: MET  2.  Pt will improve 5 x STS to less than or equal to 20 seconds to demonstrate improved functional strength and transfer efficiency.  Baseline: 27.78s w/BUE support; 25.34s no UE support (5/20) Goal status: NOT MET   3.  Pt will improve gait velocity to at least 2.5 ft/s w/LRAD for improved gait efficiency and safety  Baseline: 1.9 ft/s w/RW; 1.8 ft/s w/rollator and SBA Goal status: NOT MET  4.  Pt will improve Berg score to 44/56 for decreased fall risk  Baseline: 37/56; 44/56 (5/20) Goal status: MET   PLAN:  PT FREQUENCY: 2x/week  PT DURATION: 6 weeks  PLANNED INTERVENTIONS: 27253- PT Re-evaluation, 97750- Physical Performance Testing, 97110-Therapeutic exercises, 97530- Therapeutic activity, 97112- Neuromuscular re-education, 97535- Self Care, 66440- Manual therapy, [phone]- Gait training, [phone]- Orthotic/Prosthetic subsequent, [phone]- Canalith repositioning, [id number]- Aquatic Therapy, [phone]- Electrical stimulation (manual), Patient/Family education, Balance training, Stair training, Dry Needling, Joint mobilization, Spinal mobilization, Vestibular training, and DME instructions   Curtistine Downer Heide Brossart, PT, DPT 08/25/2023, 10:58 AM

## 2023-08-27 ENCOUNTER — Encounter: Admitting: Physical Therapy

## 2023-09-01 ENCOUNTER — Ambulatory Visit

## 2023-09-01 ENCOUNTER — Other Ambulatory Visit: Payer: Self-pay

## 2023-09-01 DIAGNOSIS — M6281 Muscle weakness (generalized): Secondary | ICD-10-CM

## 2023-09-01 DIAGNOSIS — R2681 Unsteadiness on feet: Secondary | ICD-10-CM | POA: Diagnosis not present

## 2023-09-01 NOTE — Therapy (Incomplete)
 OUTPATIENT PHYSICAL THERAPY WHEELCHAIR EVALUATION   Patient Name: Dominic Carter MRN: [id number] DOB:July 08, 1955, 68 y.o., male Today's Date: 09/01/2023  END OF SESSION:   Past Medical History:  Diagnosis Date   DVT (deep venous thrombosis) (HCC)    Dyslipidemia    ETOH abuse    Hx of BKA, right (HCC)    Hypertension    Seizure disorder (HCC)    Stroke (HCC) 2022   Past Surgical History:  Procedure Laterality Date   BELOW KNEE LEG AMPUTATION Right    HERNIA REPAIR Right 2022   INCISION AND DRAINAGE ABSCESS Right 08/03/2023   Procedure: INCISION AND DRAINAGE, ABSCESS;  Surgeon: Enid Harry, MD;  Location: [facility] OR;  Service: General;  Laterality: Right;  TAP BLOCK excision right groin wound and abscess drainage   IRRIGATION AND DEBRIDEMENT ABSCESS Right 01/06/2022   Procedure: IRRIGATION AND DEBRIDEMENT ABSCESS;  Surgeon: Enid Harry, MD;  Location: [facility] OR;  Service: General;  Laterality: Right;   Patient Active Problem List   Diagnosis Date Noted   Groin abscess 08/03/2023   Right inguinal abscess 01/06/2022   Renal abscess, left 01/06/2022   Transaminitis 09/01/2021   Tobacco abuse 09/01/2021   History of CVA (cerebrovascular accident) 09/01/2021   Scalp wound 09/01/2021   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 Earlis Glimpse  REFERRING PROVIDER: Dr. Charlsie Cool  THERAPY DIAG:  No diagnosis found.  Rationale for Evaluation and Treatment Rehabilitation  SUBJECTIVE:                                                                                                                                                                                           SUBJECTIVE STATEMENT: Pt presents for wheelchair evaluation.  He had his R LE amputation in 2012 and obtained his new prosthetic in 01/2023.  PERTINENT HISTORY: alcohol abuse, seizure disorder on Keppra , right BKA, DVT not currently anticoagulated, hypertension, R posterior MCA stroke   PRECAUTIONS: Fall  RED FLAGS: None  WEIGHT BEARING RESTRICTIONS No    PLOF:  Requires assistive device for independence, Needs assistance with homemaking, and Needs assistance with gait   PATIENT GOALS: obtain power mobility device for safety         MEDICAL HISTORY:  Primary diagnosis onset: 07/17/2023     Medical Diagnosis with ICD-10 code: Z89.511 (ICD-10-CM) - Acquired absence of right leg below knee   [] Progressive disease  Relevant future surgeries:     Height:  Weight:  Explain recent changes or trends in weight:      History:  Past Medical History:  Diagnosis Date   DVT (deep venous thrombosis) (HCC)    Dyslipidemia    ETOH abuse    Hx of BKA, right (HCC)    Hypertension    Seizure disorder (HCC)    Stroke (HCC) 2022       Cardio Status:  Functional Limitations:   [x] Intact  []  Impaired      Respiratory Status:  Functional Limitations:   [x] Intact  [] Impaired   [] SOB [] COPD [] O2 Dependent ______LPM  [] Ventilator Dependent  Resp equip:                                                     Objective Measure(s):   Orthotics:   [] Amputee:                                                             [] Prosthesis:        HOME ENVIRONMENT:  [] House [] Condo/town home [] Apartment [] Asst living [] LTCF         [] Own  [] Rent   [] Lives alone [] Lives with others -                             Hours without assistance:   [] Home is accessible to patient                                 Storage of wheelchair:  [] In home   [] Other Comments:        COMMUNITY :  TRANSPORTATION:  [] Car [] Psychologist, counselling Adapted w/c Lift []  Ambulance [] Other:                     [] Sits in wheelchair during transport   Where is w/c stored during  transport?  [] [place]  []  [employer]  r   [] Self-Driver       Drive while in  Biomedical scientist [] yes [] no   Employment and/or school:  Specific requirements pertaining to mobility        Other:  COMMUNICATION:  Verbal Communication  [] WFL [] receptive [] WFL [] expressive [] Understandable  [] Difficult to understand  [] non-communicative  Primary Language:______________ 2nd:_____________  Communication provided by:[] Patient [] Family [] Caregiver [] Translator   [] Uses an augmentative communication device     Manufacturer/Model :                                                                MOBILITY/BALANCE:  Sitting Balance  Standing Balance  Transfers  Ambulation   [] WFL      [] WFL  [] Independent  []  Independent   [] Uses UE for balance in sitting Comments:  [] Uses UE/device for stability Comments:  []  Min  assist  []  Ambulates independently with       device:___________________      []  Mod assist  []  Able to ambulate ______ feet        safely/functionally/independently   []  Min assist  []  Min assist  []  Max assist  []  Non-functional ambulator         History/High risk of falls   []  Mod assist  []  Mod assist  []  Dependent  []  Unable to ambulate   []  Max  assist  []  Max assist  Transfer method:[] 1 person [] 2 person [] sliding board [] squat pivot [] stand pivot [] mechanical patient lift  [] other:   []  Unable  []  Unable    Fall History: # of falls in the past 6 months? *** # of "near" falls in the past 6 months? ***    CURRENT SEATING / MOBILITY:  Current Mobility Device: [] None [] Cane/Walker [] Manual [] Dependent [] Dependent w/ Tilt rScooter  [] Power (type of control):   Manufacturer:  Model:  Serial #:   Size:  Color:  Age:   Purchased by whom:   Current condition of mobility base:    Current seating system:                                                                       Age of seating system:    Describe posture in present seating system:    Is the current mobility meeting medical  necessity?:  [] Yes [] No Describe:                                     Ability to complete Mobility-Related Activities of Daily Living (MRADL's) with Current Mobility Device:   Move room to room  [] Independent  [] Min [] Mod [] Max assist  [] Unable  Comments:   Meal prep  [] Independent  [] Min [] Mod [] Max assist  [] Unable    Feeding  [] Independent  [] Min [] Mod [] Max assist  [] Unable    Bathing  [] Independent  [] Min [] Mod [] Max assist  [] Unable    Grooming  [] Independent  [] Min [] Mod [] Max assist  [] Unable    UE dressing  [] Independent  [] Min [] Mod [] Max assist  [] Unable    LE dressing  [] Independent   [] Min [] Mod [] Max assist  [] Unable    Toileting  [] Independent  [] Min [] Mod [] Max assist  [] Unable    Bowel Mgt: []  Continent []  Incontinent []  Accidents []  Diapers []  Colostomy []  Bowel Program:  Bladder Mgt: []  Continent []  Incontinent []  Accidents []  Diapers []  Urinal []  Intermittent Cath []  Indwelling Cath []  Supra-pubic Cath     Current Mobility Equipment Trialed/ Ruled Out:    Does not meet mobility needs due to:    Lav[place] Powers all boxes that indicate inability to use the specific equipment listed     Meets needs for safe  independent functional  ambulation  / mobility    Risk of  Falling or History of Falls    Enviromental limitations      Cognition    Safety concerns with  physical ability    Decreased / limitations endurance  & strength     Decreased / limitations  motor skills  & coordination  Pain    Pace /  Speed    Cardiac and/or  respiratory condition    Contra - indicated by diagnosis   Cane/Crutches  []   []   []   []   []   []   []   []   []   []   []    Walker / Rollator  []  NA   []   []   []   []   []   []   []   []   []   []   []     Manual Wheelchair K0001-K0007:  []  NA  []   []   []   []   []   []   []   []   []   []   []    Manual W/C (K0005) with power assist  []  NA  []   []   []   []   []   []   []   []   []   []   []    Scooter  []  NA  []   []   []   []   []   []   []   []   []   []   []    Power  Wheelchair: standard joystick  []  NA  []   []   []   []   []   []   []   []   []   []   []    Power Wheelchair: alternative controls  []  NA  []   []   []   []   []   []   []   []   []   []   []    Summary:  The least costly alternative for independent functional mobility was found to be:    []  Crutch/Cane  []  Walker []  Manual w/c  []  Manual w/c with power assist   []  Scooter   []  Power w/c std joystick   []  Power w/c alternative control        []  Requires dependent care mobility device   Cabin crew for [employer] skills are adequate for safe mobility equipment operation  []   Yes []   No  Patient is willing and motivated to use recommended mobility equipment  []   Yes []   No       []  Patient is unable to safely operate mobility equipment independently and requires dependent care equipment Comments:           SENSATION and SKIN ISSUES:  Sensation []  Intact  []  Impaired []  Absent []  Hyposensate []  Hypersensate  []  Defensiveness  Location(s) of impairment:    Pressure Relief Method(s):  []  Lean side to side to offload (without risk of falling)  []   W/C push up (4+ times/hour for 15+ seconds) []  Stand up (without risk of falling)    []  Other: (Describe): Effective pressure relief method(s) above can be performed consistently throughout the day: [] Yes  []  No If not, Why?:  Skin Integrity Risk:       []  Low risk           []  Moderate risk            []  High risk  If high risk, explain:   Skin Issues/Skin Integrity  Current skin Issues  []  Yes []  No []  Intact  []   Red area   []   Open area  []  Scar tissue  []  At risk from prolonged sitting  Where: History of Skin Issues  []  Yes []  No Where : When: Stage: Hx of skin flap surgeries  []  Yes []  No Where:  When:  Pain: []  Yes []  No   Pain Location(s):  Intensity scale: (0-10) : How does pain interfere with mobility and/or MRADLs? -  MAT EVALUATION:  Neuro-Muscular Status: (Tone, Reflexive, Responses, etc.)      []   Intact   []  Spasticity:  []  Hypotonicity  []  Fluctuating  []  Muscle Spasms  []  Poor Righting Reactions/Poor Equilibrium Reactions  []  Primal Reflex(s):    Comments:            COMMENTS:    POSTURE:     Comments:  Pelvis Anterior/Posterior:  []  Neutral   []  Posterior  []  Anterior  []  Fixed - No movement []  Tendency away from neutral []  Flexible []  Self-correction []  External correction Obliquity (viewed from front)  []  WFL []  R Obliquity []  L Obliquity  []  Fixed - No movement []  Tendency away from neutral []  Flexible []  Self-correction []  External correction Rotation  []  WFL []  R anterior []  L anterior  []  Fixed - No movement []  Tendency away from neutral []  Flexible []  Self-correction []  External correction Tonal Influence Pelvis:  []  Normal []  Flaccid []  Low tone []  Spasticity []  Dystonia []  Pelvis thrust []  Other:    Trunk Anterior/Posterior:  []  WFL []  Thoracic kyphosis []  Lumbar lordosis  []  Fixed - No movement []  Tendency away from neutral []  Flexible []  Self-correction []  External correction  []  WFL []  Convex to left  []  Convex to right []  S-curve   []  C-curve []  Multiple curves []  Tendency away from neutral []  Flexible []  Self-correction []  External correction Rotation of shoulders and upper trunk:  []  Neutral []  Left-anterior []  Right- anterior []  Fixed- no movement []  Tendency away from neutral []  Flexible []  Self correction []  External correction Tonal influence Trunk:  []  Normal []  Flaccid []  Low tone []  Spasticity []  Dystonia []  Other:   Head & Neck  []  Functional []  Flexed    []  Extended []  Rotated right  []  Rotated left []  Laterally flexed right []  Laterally flexed left []  Cervical hyperextension   []  Good head control []  Adequate head control []  Limited head control []  Absent head control Describe tone/movement of head and neck:      Lower Extremity Measurements: LE  ROM:  {AROM/PROM:27142} ROM Right 09/01/2023 Left 09/01/2023  Hip flexion    Hip extension    Hip abduction    Hip adduction    Knee flexion    Knee extension    Ankle dorsiflexion    Ankle plantarflexion     (Blank rows = not tested)  LE MMT:  MMT Right 09/01/2023 Left 09/01/2023  Hip flexion    Hip extension    Hip abduction    Hip adduction    Knee flexion    Knee extension    Ankle dorsiflexion    Ankle plantarflexion     (Blank rows = not tested)  Hip positions:  []  Neutral   []  Abducted   []  Adducted  []  Subluxed   []  Dislocated   []  Fixed   []  Tendency away from neutral []  Flexible []  Self-correction []  External correction   Hip Windswept:[]  Neutral  []  Right    []  Left  []  Subluxed   []  Dislocated   []  Fixed   []  Tendency away from neutral []  Flexible []  Self-correction []  External correction  LE Tone: []  Normal []  Low tone []  Spasticity []  Flaccid []  Dystonia []  Rocks/Extends at hip []  Thrust into knee extension []  Pushes legs downward into footrest  Foot positioning: ROM Concerns: Dorsiflexed: []  Right   []  Left Plantar flexed: []  Right    []  Left Inversion: []  Right    []   Left Eversion: []  Right    []  Left  LE Edema: []  1+ (Barely detectable impression when finger is pressed into skin) []  2+ (slight indentation. 15 seconds to rebound) []  3+ (deeper indentation. 30 seconds to rebound) []  4+ (>30 seconds to rebound)  UE Measurements:  UPPER EXTREMITY ROM:   {AROM/PROM:27142} ROM Right 09/01/2023 Left 09/01/2023  Shoulder flexion    Shoulder abduction    Shoulder adduction    Elbow flexion    Elbow extension    Wrist flexion    Wrist extension    (Blank rows = not tested)  UPPER EXTREMITY MMT:  MMT Right 09/01/2023 Left 09/01/2023  Shoulder flexion    Shoulder abduction    Shoulder adduction    Elbow flexion    Elbow extension    Wrist flexion    Wrist extension    Pinch strength    Grip strength    (Blank rows = not  tested)  Shoulder Posture:  Right Tendency towards Left  []   Functional []    []   Elevation []    []   Depression []    []   Protraction []    []   Retraction []    []   Internal rotation []    []   External rotation []    []   Subluxed []     UE Tone: []  Normal []  Flaccid []  Low tone []  Spasticity  []  Dystonia []  Other:   UE Edema: []  1+ (Barely detectable impression when finger is pressed into skin) []  2+ (slight indentation. 15 seconds to rebound) []  3+ (deeper indentation. 30 seconds to rebound) []  4+ (>30 seconds to rebound)  Wrist/Hand: Handedness: []  Right   []  Left   []  NA: Comments:  Right  Left  []   WNL []    []   Limitations []    []   Contractures []    []   Fisting []    []   Tremors []    []   Weak grasp []    []   Poor dexterity []    []   Hand movement non functional []    []   Paralysis []         MOBILITY BASE RECOMMENDATIONS and JUSTIFICATION:  MOBILITY BASE  JUSTIFICATION   Manufacturer:    Model:                              Color:  Seat Width:   Seat Depth    []  Manual mobility base (continue below)   []  Scooter/POV  []  Power mobility base   Number of hours per day spent in above selected mobility base:   Typical daily mobility base use Schedule:    []  is not a safe, functional ambulator  []  limitation prevents from completing a MRADL(s) within a reasonable time frame    []  limitation places at high risk of morbidity or mortality secondary to  the attempts to perform a    MRADL(s)  []  limitation prevents accomplishing a MRADL(s) entirely  []  provide independent mobility  []  equipment is a lifetime medical need  []  walker or cane inadequate  []  any type manual wheelchair      inadequate  []  scooter/POV inadequate      []  requires dependent mobility          MANUAL MOBILITY      []  Standard manual wheelchair  K0001      Arm:    []  both []  right  []  left      Foot:   []  both []   right   []  left  []  self-propels wheelchair  []  will use on regular basis   []  chair fits throughout home  []  willing and motivated to use  []  propels with assistance     []  dependent use   []  Standard hemi-manual wheelchair  K0002      Arm:    []  both []  right  []  left      Foot:   []  both []  right   []  left  []  lower seat height required to foot propel  []  short stature  []  self-propels wheelchair  []  will use on regular basis  []  chair fits throughout home  []  willing and motivated to use   []  propels with assistance  []  dependent use   []  Lightweight manual wheelchair  K0003      Arm:    []  both []  right  []  left      Foot:   []  both  []  right  []  left                   []  hemi height required  []  medical condition and weight of  wheelchair affect ability to self      propel standard manual wheelchair in the residence  []  can and does self-propel (marginal propulsion skills)  []  daily use _________hours  []  chair fits throughout home  []  willing and motivated to use  []  lower seat height required to foot propel  []  short stature   []  High strength lightweight manual  wheelchair (Breezy Ultra 4)  K0004     Arm:    []  both []  right  []  left     Foot:   []  both []  right   []  left                                                                  []  hemi height required []  medical condition and weight of wheelchair affect ability to self propel while engaging in frequent MRADL(s) that cannot be performed in a standard or lightweight manual wheelchair  []  daily use _________hours  []  chair fits throughout home  []  willing and motivated to use  []  prevent repetitive use injuries   []  lower seat height required to foot propel  []  short stature    []  Ultra-lightweight manual wheelchair  K0005     Arm:    []  both []  right  []  left     Foot:   []  both []  right  []  left       []  hemi height required  []  heavy duty    Front seat to floor _____ inches      Rear seat to floor _____ inches      Back height _____ inches     Back angle ______ degrees       Front angle _____ degrees  []   full-time manual wheelchair user  []  Requires individualized fitting and optimal adjustments for multiple features that include adjustable axle configuration, fully adjustable center of gravity, wheel camber, seat and back angle, angle of seat slope, which cannot be accommodated by a K0001 through K0004 manual wheelchair  []  prevent repetitive use injuries  []  daily use_________hours   []  user has high activity  patterns that frequently require  them  to go out into the community for the purpose of independently accomplishing high level MRADL activities. Examples of these might include a combination of; shopping, work, school, Photographer, childcare, independently loading and unloading from a vehicle etc.  []  lower seat height required to foot propel  []  short stature  []  heavy duty -  weight over 250lbs   []  Current chair is a K0005   manufacture:___________________  model:_________________  serial#____________________  age:_________    []  First time [id number] user (complete trial)  K0004 time and # of strokes to propel 30 feet: ________seconds _________strokes  U0454 time and # of strokes to propel 30 feet: ________seconds _________strokes  What was the result of the trial between the K0004 and K0005 manual wheelchair? ___    What features of the K0005 w/c are needed as compared to the K0004 base? Why?___    []  adjustable seat and back angle changes the angle of seat slope of the frame to attain a gravity assisted position for efficient propulsion and proper weight distribution along the frame     []  the front of the wheelchair will be configured higher than the back of the chair to allow gravity to assist the user with postural stability  []  the center of the wheel will be positioned for stability, safety and efficient propulsion  []  adjustable axle allows for vertical, horizontal, camber and overall width changes  throughout the wheels for adjustment of the  client's exact needs and abilities.   []  adjustable axle increases the stability and function of the chair allowing for adjustment of the center of gravity.   []  accommodates the client's anatomical position in the chair maximizing independence in mobility and maneuverability in all environments.   []  create a minimal fixed tilt-in space to assist in positioning.   []  Describe users full-time manual wheelchair activity patterns:___    []  Power assist Comments:  []  prevent repetitive use injuries  []  repetitive strain injury present in    shoulder girdle    []  shoulder pain is (> or =) to 7/10     during manual propulsion       Current Pain _____/10  []  requires conservation of energy to participate in MRADL(s) runable to propel up ramps or curbs using manual wheelchair  []  been K0005 user greater than one year  []  user unwilling to use power      wheelchair (reason): []  less expensive option to power   wheelchair   []  rim activated power assist -      decreased strength   []  Heavy duty manual wheelchair       K0006     Arm:    []  both []  right  []  left     Foot:   []  both []  right  []  left     []  hemi height required    []  Dependent base  []  user exceeds 250lbs  []  non-functional ambulator    []  extreme spasticity  []  over active movement   []  broken frame/hx of repeated     repairs  []  able to self-propel in residence       []  lower seat to floor height required  []  unable to self-propel in residence   []  Extra heavy duty manual wheelchair  K0007     Arm:    []  both []  right  []  left     Foot:   []  both []  right  []  left     []   hemi height required  []  Dependent base  []  user exceeds 300lbs  []  non-functional ambulator    []  able to self-propel in residence   []  lower seat to floor height required  []  unable to self-propel in residence     []  Manual wheelchair with tilt [phone]      (Manual "Tilt-n-Space")  []  patient is dependent for transfers  []  patient requires frequent        positioning for pressure relief   []  patient requires frequent      positioning for poor/absent trunk control        []  Stroller Base  []  infant/child   []  unable to propel manual      wheelchair  []  allows for growth  []  non-functional ambulator  []  non-functional UE  []  independent mobility is not a goal at this time    MANUAL FRAME OPTIONS      Push handles  []  extended   []  angle adjustable   []  standard  []  caregiver access  []  caregiver assist    []  allows "hooking" to enable      increased ability to perform ADLs or maintain balance   []  Angle Adjustable Back  []  postural control  []  control of tone/spasticity  []  accommodation of range of motion  []  UE functional control  []  accommodation for seating system    Rear wheel placement  []  std/fixed  [] fully adjustableramputee   []  camber ________degree  []  removable rear wheel  []  non-removable rear wheel  Wheel size _______  Wheel style_______________________  []  improved UE access to wheels  []  increase propulsion ability  []  improved stability  []  changing angle in space for      improvement of postural stability  []  remove for transport    []  allow for seating system to fit on  base  []  amputee placement  []  1-arm drive access   r R  r L  []  enable propulsion of manual       wheelchair with one arm    []  amputee placement   Wheel rims/ Hand rims  []  Standard    []  Specialized-____ []  provide ability to propel manual   []  increase self-propulsion with hand wheelchair weakness/decreased grasp     []  Spoke protector/guard   []  prevent hands from getting caught in spokes   Tires:  []  pneumatic  []  flat free inserts  []  solid  Style:  []  decrease roll resistance              []  prevent frequent flats  []  increase shock absorbency  []  decrease maintenance   []  decrease pain from road shock    []  decrease spasms from road shock    Wheel Locks:    []  push []  pull []  scissor  []  lock wheels for transfers  []  lock  wheels from rolling   Brake/wheel lock extension:  []  R  []  L  []  allow user to operate wheel locks due to decreased reach or strength   Caster housing:  Caster size:                      Style:                                          []  suspension fork  []  maneuverability   []  stability of  wheelchair   []  durability  []  maintenance  []  angle adjustment for posture  []  allow for feet to come under        wheelchair base  []  allows change in seat to floor  height   []  increase shock absorbency  []  decrease pain from road shock  []  decrease spasms from road    shock   []  Side guards  []  prevent clothing getting caught in wheel or becoming soiled   [] provide hip and pelvic stability  []  eliminates contact between body and wheels  []  limit hand contact with wheels   []  Anti-tippers      []  prevent wheelchair from tipping    backward  []  assist caregiver with curbs     POWER MOBILITY      []  Scooter/POV    []  can safely operate   []  can safely transfer   []  has adequate trunk stability   []  cannot functionally propel  manual wheelchair    []  Power mobility base    []  non-ambulatory   []  cannot functionally propel manual wheelchair   []  cannot functionally and safely      operate scooter/POV  []  can safely operate power       wheelchair  []  home is accessible  []  willing to use power wheelchair     Tilt  []  Powered tilt on powered chair  []  Powered tilt on manual chair  []  Manual tilt on manual chair Comments:  []  change position for pressure      []  elief/cannot weight shift   []  change position against      gravitational force on head and      shoulders   []  decrease pain  []  blood pressure management   []  control autonomic dysreflexia  []  decrease respiratory distress  []  management of spasticity  []  management of low tone  []  facilitate postural control   []  rest periods   []  control edema  []  increase sitting tolerance   []  aid with transfers     Recline   []   Power recline on power chair  []  Manual recline on manual chair  Comments:    []  intermittent catheterization  []  manage spasticity  []  accommodate femur to back angle  []  change position for pressure relief/cannot weight shift rhigh risk of pressure sore development  []  tilt alone does not accomplish     effective pressure relief, maximum pressure relief achieved at -      _______ degrees tilt   _______ degrees recline   []  difficult to transfer to and from bed []  rest periods and sleeping in chair  []  repositioning for transfers  []  bring to full recline for ADL care  []  clothing/diaper changes in chair  []  gravity PEG tube feeding  []  head positioning  []  decrease pain  []  blood pressure management   []  control autonomic dysreflexia  []  decrease respiratory distress  []  user on ventilator     Elevator on mobility base  []  Power wheelchair  []  Scooter  []  increase Indep in transfers   []  increase Indep in ADLs    []  bathroom function and safety  []  kitchen/cooking function and safety  []  shopping  []  raise height for communication at standing level  []  raise height for eye contact which reduces cervical neck strain and pain  []  drive at raised height for safety and navigating crowds  []  Other:   []  Vertical position system  (anterior tilt)     (  Drive locks-out)    []  Stand       (Drive enabled)  []  independent weight bearing  []  decrease joint contractures  []  decrease/manage spasticity  []  decrease/manage spasms  []  pressure distribution away from   scapula, sacrum, coccyx, and ischial tuberosity  []  increase digestion and elimination   []  access to counters and cabinets  []  increase reach  []  increase interaction with others at eye level, reduces neck strain  []  increase performance of       MRADL(s)      Power elevating legrest    []  Center mount (Single) 85-170 degrees       []  Standard (Pair) 100-170 degrees  []  position legs at 90 degrees, not available  with std power ELR  []  center mount tucks into chair to decrease turning radius in home, not available with std power ELR  []  provide change in position for LE  []  elevate legs during recline    []  maintain placement of feet on      footplate  []  decrease edema  []  improve circulation  []  actuator needed to elevate legrest  []  actuator needed to articulate legrest preventing knees from flexing  []  Increase ground clearance over      curbs  []   STD (pair) independently                     elevate legrest   POWER WHEELCHAIR CONTROLS      Controls/input device  []  Expandable  []  Non-expandable  []  Proportional  []  Right Hand []  Left Hand  []  Non-proportional/switches/head-array  []  Electrical/proximity         []   Mechanical      Manufacturer:___________________   Type:________________________ []  provides access for controlling wheelchair  []  programming for accurate control  []  progressive disease/changing condition  []  required for alternative drive      controls       []  lacks motor control to operate  proportional drive control  []  unable to understand proportional controls  []  limited movement/strength  []  extraneous movement / tremors / ataxic / spastic       []  Upgraded electronics controller/harness    []  Single power (tilt or recline)   []  Expandable    []  Non-expandable plus   []  Multi-power (tilt, recline, power legrest, power seat lift, vertical positioning system, stand)  []  allows input device to communicate with drive motors  []  harness provides necessary connections between the controller, input device, and seat functions     []  needed in order to operate power seat functions through joystick/ input device  []  required for alternative drive controls     []  Enhanced display  []  required to connect all alternative drive controls   []  required for upgraded joystick      (lite-throw, heavy duty, micro)  []  Allows user to see in which mode and drive the wheelchair  is set; necessary for alternate controls       []  Upgraded tracking electronics  []  correct tracking when on uneven surfaces makes switch driving more efficient and less fatiguing  []  increase safety when driving  []  increase ability to traverse thresholds    []  Safety / reset / mode switches     Type:    []  Used to change modes and stop the wheelchair when driving     []  Mount for joystick / input device/switches  []  swing away for access or transfers   []  attaches  joystick / input device / switches to wheelchair   []  provides for consistent access  []  midline for optimal placement    []  Attendant controlled joystick plus     mount  []  safety  []  long distance driving  []  operation of seat functions  []  compliance with transportation regulations    []  Battery  []  required to power (power assist / scooter/ power wc / other):   []  Power inverter (24V to 12V)  []  required for ventilator / respiratory equipment / other:     CHAIR OPTIONS MANUAL & POWER      Armrests   []  adjustable height []  removable  []  swing away []  fixed  []  flip back  []  reclining  []  full length pads []  desk []  tube arms []  gel pads  []  provide support with elbow at 90    []  remove/flip back/swing away for  transfers  []  provide support and positioning of upper body    []  allow to come closer to table top  []  remove for access to tables  []  provide support for w/c tray  []  change of height/angles for variable activities   []  Elbow support / Elbow stop  []  keep elbow positioned on arm pad  []  keep arms from falling off arm pad  during tilt and/or recline   Upper Extremity Support  []  Arm trough  []   R  []   L  Style:  []  swivel mount []  fixed mount   []  posterior hand support  []   tray  []  full tray  []  joystick cut out  []   R  []   L  Style:  []  decrease gravitational pull on      shoulders  []  provide support to increase UE  function  []  provide hand support in natural    position  []  position flaccid UE   []  decrease subluxation    []  decrease edema       []  manage spasticity   []  provide midline positioning  []  provide work surface  []  placement for AAC/ Computer/ EADL       Hangers/ Legrests   []  ______ degree  []  Elevating []  articulating  []  swing away []  fixed []  lift off  []  heavy duty  []  adjustable knee angle  []  adjustable calf panel   []  longer extension tube              []  provide LE support  []  maintain placement of feet on      footplate   []  accommodate lower leg length  []  accommodate to hamstring       tightness  []  enable transfers  []  provide change in position for LE's  []  elevate legs during recline    []  decrease edema  []  durability      Foot support   []  footplate []  R []  L []  flip up           []  Depth adjustable   []  angle adjustable  []  foot board/one piece    []  provide foot support  []  accommodate to ankle ROM  []  allow foot to go under wheelchair base  []  enable transfers     []  Shoe holders  []  position foot    []  decrease / manage spasticity  []  control position of LE  []  stability    []  safety     []  Ankle strap/heel      loops  []  support foot on foot  support  []  decrease extraneous movement  []  provide input to heel   []  protect foot     []  Amputee adapter []  R  []  L     Style:                  Size:  []  Provide support for stump/residual extremity    []  Transportation tie-down  []  to provide crash tested tie-down brackets    []  Crutch/cane holder    []  O2 holder    []  IV hanger   []  Ventilator tray/mount    []  stabilize accessory on wheelchair       Component  Justification     []  Seat cushion      []  accommodate impaired sensation  []  decubitus ulcers present or history  []  unable to shift weight  []  increase pressure distribution  []  prevent pelvic extension  []  custom required "off-the-shelf"    seat cushion will not accommodate deformity  []  stabilize/promote pelvis alignment  []  stabilize/promote femur alignment  []   accommodate obliquity  []  accommodate multiple deformity  []  incontinent/accidents  []  low maintenance     []  seat mounts                 []  fixed []  removable  []  attach seat platform/cushion to wheelchair frame    []  Seat wedge    []  provide increased aggressiveness of seat shape to decrease sliding  down in the seat  []  accommodate ROM        []  Cover replacement   []  protect back or seat cushion  []  incontinent/accidents    []  Solid seat / insert    []  support cushion to prevent      hammocking  []  allows attachment of cushion to mobility base    []  Lateral pelvic/thigh/hip     support (Guides)     []  decrease abduction  []  accommodate pelvis  []  position upper legs  []  accommodate spasticity  []  removable for transfers     []  Lateral pelvic/thigh      supports mounts  []  fixed   []  swing-away   []  removable  []  mounts lateral pelvic/thigh supports     []  mounts lateral pelvic/thigh supports swing-away or removable for transfers    []  Medial thigh support ([place]mel)  [] decrease adduction  [] accommodate ROM  []  remove for transfers   []  alignment      []  Medial thigh   []  fixed      support mounts      []  swing-away   []  removable  []  mounts medial thigh supports   []  Mounts medial supports swing- away or removable for transfers       Component  Justification   []  Back       []  provide posterior trunk support []  facilitate tone  []  provide lumbar/sacral support []  accommodate deformity  []  support trunk in midline   []  custom required "off-the-shelf" back support will not accommodate deformity   []  provide lateral trunk support []  accommodate or decrease tone            []  Back mounts  []  fixed  []  removable  []  attach back rest/cushion to wheelchair frame   []  Lateral trunk      supports  []  R []  L  []  decrease lateral trunk leaning  []  accommodate asymmetry    []  contour for increased contact  []  safety    []  control of tone    []   Lateral trunk      supports mounts   []  fixed  []  swing-away   []  removable  []  mounts lateral trunk supports     []  Mounts lateral trunk supports swing-away or removable for transfers   []  Anterior chest      strap, vest     []  decrease forward movement of shoulder  []  decrease forward movement of trunk  []  safety/stability  []  added abdominal support  []  trunk alignment  []  assistance with shoulder control   []  decrease shoulder elevation    []  Headrest      []  provide posterior head support  []  provide posterior neck support  []  provide lateral head support  []  provide anterior head support  []  support during tilt and recline  []  improve feeding     []  improve respiration  []  placement of switches  []  safety    []  accommodate ROM   []  accommodate tone  []  improve visual orientation   []  Headrest           []  fixed []  removable []  flip down      Mounting hardware   []  swing-away laterals/switches  []  mount headrest   []  mounts headrest flip down or  removable for transfers  []  mount headrest swing-away laterals   []  mount switches     []  Neck Support    []  decrease neck rotation  []  decrease forward neck flexion   Pelvic Positioner    []  std hip belt          []  padded hip belt  []  dual pull hip belt  []  four point hip belt  []  stabilize tone  []  decrease falling out of chair  []  prevent excessive extension  []  special pull angle to control      rotation  []  pad for protection over boney   prominence  []  promote comfort    []  Essential needs        bag/pouch   []  medicines []  special food rorthotics []  clothing changes  []  diapers  []  catheter/hygiene []  ostomy supplies   The above equipment has a life- long use expectancy.  Growth and changes in medical and/or functional conditions would be the exceptions.   SUMMARY:  Why mobility device was selected; include why a lower level device is not appropriate: ***  ASSESSMENT:  CLINICAL IMPRESSION: Patient is a *** y.o. *** who was seen today for physical  therapy evaluation and treatment for ***.    OBJECTIVE IMPAIRMENTS {opptimpairments:25111}.   ACTIVITY LIMITATIONS {activitylimitations:27494}  PARTICIPATION LIMITATIONS: {participationrestrictions:25113}  PERSONAL FACTORS {Personal factors:25162} are also affecting patient's functional outcome.   REHAB POTENTIAL: {rehabpotential:25112}  CLINICAL DECISION MAKING: {clinical decision making:25114}  EVALUATION COMPLEXITY: {Evaluation complexity:25115}                                   GOALS: One time visit. No goals established.    PLAN: PT FREQUENCY: one time visit    Kristine Phalen, PT 09/01/2023, 8:04 AM    I concur with the above findings and recommendations of the therapist:  Physician name printed:         Physician's signature:      Date:

## 2023-09-01 NOTE — Therapy (Unsigned)
 OUTPATIENT PHYSICAL THERAPY WHEELCHAIR EVALUATION   Patient Name: Dominic Carter MRN: [id number] DOB:1956/01/12, 68 y.o., male Today's Date: 09/01/2023  END OF SESSION:  PT End of Session - 09/01/23 1104     Visit Number 1    Number of Visits 1    PT Start Time 1100    PT Stop Time 1145    PT Time Calculation (min) 45 min    Equipment Utilized During Treatment Gait belt    Activity Tolerance Patient tolerated treatment well    Behavior During Therapy WFL for tasks assessed/performed             Past Medical History:  Diagnosis Date   DVT (deep venous thrombosis) (HCC)    Dyslipidemia    ETOH abuse    Hx of BKA, right (HCC)    Hypertension    Seizure disorder (HCC)    Stroke (HCC) 2022   Past Surgical History:  Procedure Laterality Date   BELOW KNEE LEG AMPUTATION Right    HERNIA REPAIR Right 2022   INCISION AND DRAINAGE ABSCESS Right 08/03/2023   Procedure: INCISION AND DRAINAGE, ABSCESS;  Surgeon: Enid Harry, MD;  Location: [facility] OR;  Service: General;  Laterality: Right;  TAP BLOCK excision right groin wound and abscess drainage   IRRIGATION AND DEBRIDEMENT ABSCESS Right 01/06/2022   Procedure: IRRIGATION AND DEBRIDEMENT ABSCESS;  Surgeon: Enid Harry, MD;  Location: [facility] OR;  Service: General;  Laterality: Right;   Patient Active Problem List   Diagnosis Date Noted   Groin abscess 08/03/2023   Right inguinal abscess 01/06/2022   Renal abscess, left 01/06/2022   Transaminitis 09/01/2021   Tobacco abuse 09/01/2021   History of CVA (cerebrovascular accident) 09/01/2021   Scalp wound 09/01/2021   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 Earlis Glimpse  REFERRING PROVIDER: Dr. Charlsie Cool  THERAPY DIAG:   Unsteadiness on feet  Muscle weakness (generalized)  Rationale for Evaluation and Treatment Rehabilitation  SUBJECTIVE:                                                                                                                                                                                           SUBJECTIVE STATEMENT: Pt presents for wheelchair evaluation. He had his R LE amputation in 2012 and obtained his new prosthetic in 01/2023. Pt lives at home with Dominic Carter. Pt requires max A with laundry and cleaning.  Pt requires min A with don/doff clothes as he has to sit on chair to don/doff clothes and with dressing of LE Pt requires min A from Dominic Carter for bathroom transfers for safely as family doesn't have grab bars in the bathroom. Pt does have shower chair but Dominic Carter helps him with stepping over bathroom. Pt requires max A with cooking. Pt requires min A with car transfers.  PERTINENT HISTORY: alcohol abuse, seizure disorder on Keppra , right BKA, DVT not currently anticoagulated, hypertension, R posterior MCA stroke   PRECAUTIONS: Fall  RED FLAGS: None  WEIGHT BEARING RESTRICTIONS No    PLOF:  Requires assistive device for independence, Needs assistance with homemaking, and Needs assistance with gait   PATIENT GOALS: obtain power mobility device for safety         MEDICAL HISTORY:  Primary diagnosis onset: 07/17/2023     Medical Diagnosis with ICD-10 code: Z89.511 (ICD-10-CM) - Acquired absence of right leg below knee   [] Progressive disease  Relevant future surgeries:     Height: 5\' 11"  Weight: 150 lbs Explain recent changes or trends in weight:      History:  Past Medical History:  Diagnosis Date   DVT (deep venous thrombosis) (HCC)    Dyslipidemia    ETOH abuse    Hx of BKA, right (HCC)    Hypertension    Seizure disorder (HCC)    Stroke (HCC) 2022       Cardio Status:  Functional Limitations:   [x] Intact  []  Impaired      Respiratory Status:  Functional  Limitations:   [x] Intact  [] Impaired   [] SOB [] COPD [] O2 Dependent ______LPM  [] Ventilator Dependent  Resp equip:                                                     Objective Measure(s):   Orthotics:   [] Amputee:                                                             [x] Prosthesis:  R BKA       HOME ENVIRONMENT:  [x] House [] Condo/town home [] Apartment [] Asst living [] LTCF         [x] Own  [] Rent   [] Lives alone [x] Lives with others -     Dominic Carter and her son                        Hours without assistance: 4-6 hours  [x] Home is accessible to patient                                 Storage of wheelchair:  [x] In home   [] Other Comments:        COMMUNITY :  TRANSPORTATION:  [x] Car [] Orthoptist [] Adapted w/c Lift []  Ambulance [] Other:                     [] Sits in wheelchair during transport   Where is w/c stored during transport?  [] [place] [place]  []  [employer]  r   [] Self-Driver  Drive while in  Wheelchair [] yes [x] no   Employment and/or school:  Specific requirements pertaining to mobility        Other:  COMMUNICATION:  Verbal Communication  [x] WFL [] receptive [] WFL [] expressive [] Understandable  [] Difficult to understand  [] non-communicative  Primary Language:____English__________ 2nd:_____________  Communication provided by:[x] Patient [x] Family [] Caregiver [] Translator   [] Uses an Paramedic device     Manufacturer/Model :                                                                MOBILITY/BALANCE:  Sitting Balance  Standing Balance  Transfers  Ambulation   [x] WFL      [] WFL  [] Independent  []  Independent   [] Uses UE for balance in sitting Comments:  [x] Uses UE/device for stability Comments: uses walker/rolator [x]  Min assist uses furniture, rolator, walker [x]  Ambulates independently with       device:_____walker and rolator______________      []  Mod assist  []  Able to ambulate ______ feet        safely/functionally/independently    []  Min assist  []  Min assist  []  Max assist  []  Non-functional ambulator         History/High risk of falls   []  Mod assist  []  Mod assist  []  Dependent  []  Unable to ambulate   []  Max  assist  []  Max assist  Transfer method:[] 1 person [] 2 person [] sliding board [] squat pivot [] stand pivot [] mechanical patient lift  [] other:   []  Unable  []  Unable    Fall History: # of falls in the past 6 months? 3 # of "near" falls in the past 6 months? Multiple  falls per day    CURRENT SEATING / MOBILITY:  Current Mobility Device: [] None [x] Cane/Walker [] Manual [] Dependent [] Dependent w/ Tilt rScooter  [] Power (type of control):   Manufacturer:  Model:  Serial #:   Size:  Color:  Age:   Purchased by whom:   Current condition of mobility base:    Current seating system:                                                                       Age of seating system:    Describe posture in present seating system:    Is the current mobility meeting medical necessity?:  [] Yes [] No Describe:                                     Ability to complete Mobility-Related Activities of Daily Living (MRADL's) with Current Mobility Device:   Move room to room  [] Independent  [] Min [] Mod [] Max assist  [] Unable  Comments:   Meal prep  [] Independent  [] Min [] Mod [] Max assist  [] Unable    Feeding  [] Independent  [] Min [] Mod [] Max assist  [] Unable    Bathing  [] Independent  [] Min [] Mod [] Max assist  [] Unable    Grooming  [] Independent  [] Min [] Mod [] Max assist  [] Unable    UE  dressing  [] Independent  [] Min [] Mod [] Max assist  [] Unable    LE dressing  [] Independent   [] Min [] Mod [] Max assist  [] Unable    Toileting  [] Independent  [] Min [] Mod [] Max assist  [] Unable    Bowel Mgt: []  Continent []  Incontinent []  Accidents []  Diapers []  Colostomy []  Bowel Program:  Bladder Mgt: []  Continent []  Incontinent []  Accidents []  Diapers []  Urinal []  Intermittent Cath []  Indwelling Cath []  Supra-pubic Cath     Current Mobility  Equipment Trialed/ Ruled Out:    Does not meet mobility needs due to:    Mark all boxes that indicate inability to use the specific equipment listed     Meets needs for safe  independent functional  ambulation  / mobility    Risk of  Falling or History of Falls    Enviromental limitations      Cognition    Safety concerns with  physical ability    Decreased / limitations endurance  & strength     Decreased / limitations  motor skills  & coordination    Pain    Pace /  Speed    Cardiac and/or  respiratory condition    Contra - indicated by diagnosis   Cane/Crutches  []   []   []   []   []   []   []   []   []   []   []    Walker / Rollator  []  NA   []   []   []   []   []   []   []   []   []   []   []     Manual Wheelchair [id number]:  []  NA  []   []   []   []   []   []   []   []   []   []   []    Manual W/C (K0005) with power assist  []  NA  []   []   []   []   []   []   []   []   []   []   []    Scooter  []  NA  []   []   []   []   []   []   []   []   []   []   []    Power Wheelchair: standard joystick  []  NA  []   []   []   []   []   []   []   []   []   []   []    Power Wheelchair: alternative controls  []  NA  []   []   []   []   []   []   []   []   []   []   []    Summary:  The least costly alternative for independent functional mobility was found to be:    []  Crutch/Cane  []  Walker []  Manual w/c  []  Manual w/c with power assist   []  Scooter   []  Power w/c std joystick   []  Power w/c alternative control        []  Requires dependent care mobility device   Cabin crew for [employer] skills are adequate for safe mobility equipment operation  []   Yes []   No  Patient is willing and motivated to use recommended mobility equipment  []   Yes []   No       []  Patient is unable to safely operate mobility equipment independently and requires dependent care equipment Comments:           SENSATION and SKIN ISSUES:  Sensation []  Intact  []  Impaired []  Absent []  Hyposensate []  Hypersensate  []  Defensiveness  Location(s) of  impairment:    Pressure Relief Method(s):  []  Lean side to side to offload (  without risk of falling)  []   W/C push up (4+ times/hour for 15+ seconds) []  Stand up (without risk of falling)    []  Other: (Describe): Effective pressure relief method(s) above can be performed consistently throughout the day: [] Yes  []  No If not, Why?:  Skin Integrity Risk:       []  Low risk           []  Moderate risk            []  High risk  If high risk, explain:   Skin Issues/Skin Integrity  Current skin Issues  []  Yes []  No []  Intact  []   Red area   []   Open area  []  Scar tissue  []  At risk from prolonged sitting  Where: History of Skin Issues  []  Yes []  No Where : When: Stage: Hx of skin flap surgeries  []  Yes []  No Where:  When:  Pain: []  Yes []  No   Pain Location(s):  Intensity scale: (0-10) : How does pain interfere with mobility and/or MRADLs? -         MAT EVALUATION:  Neuro-Muscular Status: (Tone, Reflexive, Responses, etc.)     []   Intact   []  Spasticity:  []  Hypotonicity  []  Fluctuating  []  Muscle Spasms  []  Poor Righting Reactions/Poor Equilibrium Reactions  []  Primal Reflex(s):    Comments:            COMMENTS:    POSTURE:     Comments:  Pelvis Anterior/Posterior:  []  Neutral   []  Posterior  []  Anterior  []  Fixed - No movement []  Tendency away from neutral []  Flexible []  Self-correction []  External correction Obliquity (viewed from front)  []  WFL []  R Obliquity []  L Obliquity  []  Fixed - No movement []  Tendency away from neutral []  Flexible []  Self-correction []  External correction Rotation  []  WFL []  R anterior []  L anterior  []  Fixed - No movement []  Tendency away from neutral []  Flexible []  Self-correction []  External correction Tonal Influence Pelvis:  []  Normal []  Flaccid []  Low tone []  Spasticity []  Dystonia []  Pelvis thrust []  Other:    Trunk Anterior/Posterior:  []  WFL []  Thoracic kyphosis []  Lumbar lordosis  []  Fixed  - No movement []  Tendency away from neutral []  Flexible []  Self-correction []  External correction  []  WFL []  Convex to left  []  Convex to right []  S-curve   []  C-curve []  Multiple curves []  Tendency away from neutral []  Flexible []  Self-correction []  External correction Rotation of shoulders and upper trunk:  []  Neutral []  Left-anterior []  Right- anterior []  Fixed- no movement []  Tendency away from neutral []  Flexible []  Self correction []  External correction Tonal influence Trunk:  []  Normal []  Flaccid []  Low tone []  Spasticity []  Dystonia []  Other:   Head & Neck  []  Functional []  Flexed    []  Extended []  Rotated right  []  Rotated left []  Laterally flexed right []  Laterally flexed left []  Cervical hyperextension   []  Good head control []  Adequate head control []  Limited head control []  Absent head control Describe tone/movement of head and neck:      Lower Extremity Measurements: LE ROM:  {AROM/PROM:27142} ROM Right 09/01/2023 Left 09/01/2023  Hip flexion    Hip extension    Hip abduction    Hip adduction    Knee flexion    Knee extension    Ankle dorsiflexion    Ankle plantarflexion     (Blank rows = not  tested)  LE MMT:  MMT Right 09/01/2023 Left 09/01/2023  Hip flexion    Hip extension    Hip abduction    Hip adduction    Knee flexion    Knee extension    Ankle dorsiflexion    Ankle plantarflexion     (Blank rows = not tested)  Hip positions:  []  Neutral   []  Abducted   []  Adducted  []  Subluxed   []  Dislocated   []  Fixed   []  Tendency away from neutral []  Flexible []  Self-correction []  External correction   Hip Windswept:[]  Neutral  []  Right    []  Left  []  Subluxed   []  Dislocated   []  Fixed   []  Tendency away from neutral []  Flexible []  Self-correction []  External correction  LE Tone: []  Normal []  Low tone []  Spasticity []  Flaccid []  Dystonia []  Rocks/Extends at hip []  Thrust into knee extension []  Pushes legs  downward into footrest  Foot positioning: ROM Concerns: Dorsiflexed: []  Right   []  Left Plantar flexed: []  Right    []  Left Inversion: []  Right    []  Left Eversion: []  Right    []  Left  LE Edema: []  1+ (Barely detectable impression when finger is pressed into skin) []  2+ (slight indentation. 15 seconds to rebound) []  3+ (deeper indentation. 30 seconds to rebound) []  4+ (>30 seconds to rebound)  UE Measurements:  UPPER EXTREMITY ROM:   {AROM/PROM:27142} ROM Right 09/01/2023 Left 09/01/2023  Shoulder flexion    Shoulder abduction    Shoulder adduction    Elbow flexion    Elbow extension    Wrist flexion    Wrist extension    (Blank rows = not tested)  UPPER EXTREMITY MMT:  MMT Right 09/01/2023 Left 09/01/2023  Shoulder flexion    Shoulder abduction    Shoulder adduction    Elbow flexion    Elbow extension    Wrist flexion    Wrist extension    Pinch strength    Grip strength    (Blank rows = not tested)  Shoulder Posture:  Right Tendency towards Left  []   Functional []    []   Elevation []    []   Depression []    []   Protraction []    []   Retraction []    []   Internal rotation []    []   External rotation []    []   Subluxed []     UE Tone: []  Normal []  Flaccid []  Low tone []  Spasticity  []  Dystonia []  Other:   UE Edema: []  1+ (Barely detectable impression when finger is pressed into skin) []  2+ (slight indentation. 15 seconds to rebound) []  3+ (deeper indentation. 30 seconds to rebound) []  4+ (>30 seconds to rebound)  Wrist/Hand: Handedness: []  Right   []  Left   []  NA: Comments:  Right  Left  []   WNL []    []   Limitations []    []   Contractures []    []   Fisting []    []   Tremors []    []   Weak grasp []    []   Poor dexterity []    []   Hand movement non functional []    []   Paralysis []         MOBILITY BASE RECOMMENDATIONS and JUSTIFICATION:  MOBILITY BASE  JUSTIFICATION   Manufacturer:    Model:                              Color:  Seat Width:  Seat Depth    []  Manual mobility base (continue below)   []  Scooter/POV  []  Power mobility base   Number of hours per day spent in above selected mobility base:   Typical daily mobility base use Schedule:    []  is not a safe, functional ambulator  []  limitation prevents from completing a MRADL(s) within a reasonable time frame    []  limitation places at high risk of morbidity or mortality secondary to  the attempts to perform a    MRADL(s)  []  limitation prevents accomplishing a MRADL(s) entirely  []  provide independent mobility  []  equipment is a lifetime medical need  []  walker or cane inadequate  []  any type manual wheelchair      inadequate  []  scooter/POV inadequate      []  requires dependent mobility          MANUAL MOBILITY      []  Standard manual wheelchair  K0001      Arm:    []  both []  right  []  left      Foot:   []  both []  right   []  left  []  self-propels wheelchair  []  will use on regular basis  []  chair fits throughout home  []  willing and motivated to use  []  propels with assistance     []  dependent use   []  Standard hemi-manual wheelchair  K0002      Arm:    []  both []  right  []  left      Foot:   []  both []  right   []  left  []  lower seat height required to foot propel  []  short stature  []  self-propels wheelchair  []  will use on regular basis  []  chair fits throughout home  []  willing and motivated to use   []  propels with assistance  []  dependent use   []  Lightweight manual wheelchair  K0003      Arm:    []  both []  right  []  left      Foot:   []  both  []  right  []  left                   []  hemi height required  []  medical condition and weight of  wheelchair affect ability to self      propel standard manual wheelchair in the residence  []  can and does self-propel (marginal propulsion skills)  []  daily use _________hours  []  chair fits throughout home  []  willing and motivated to use  []  lower seat height required to foot propel  []  short  stature   []  High strength lightweight manual  wheelchair (Breezy Ultra 4)  K0004     Arm:    []  both []  right  []  left     Foot:   []  both []  right   []  left                                                                  []  hemi height required []  medical condition and weight of wheelchair affect ability to self propel while engaging in frequent MRADL(s) that cannot be performed in a standard or lightweight manual wheelchair  []  daily use _________hours  []  chair fits throughout home  []   willing and motivated to use  []  prevent repetitive use injuries   []  lower seat height required to foot propel  []  short stature    []  Ultra-lightweight manual wheelchair  K0005     Arm:    []  both []  right  []  left     Foot:   []  both []  right  []  left       []  hemi height required  []  heavy duty    Front seat to floor _____ inches      Rear seat to floor _____ inches      Back height _____ inches     Back angle ______ degrees      Front angle _____ degrees  []   full-time manual wheelchair user  []  Requires individualized fitting and optimal adjustments for multiple features that include adjustable axle configuration, fully adjustable center of gravity, wheel camber, seat and back angle, angle of seat slope, which cannot be accommodated by a K0001 through K0004 manual wheelchair  []  prevent repetitive use injuries  []  daily use_________hours   []  user has high activity patterns that frequently require  them  to go out into the community for the purpose of independently accomplishing high level MRADL activities. Examples of these might include a combination of; shopping, work, school, Photographer, childcare, independently loading and unloading from a vehicle etc.  []  lower seat height required to foot propel  []  short stature  []  heavy duty -  weight over 250lbs   []  Current chair is a K0005   manufacture:___________________  model:_________________  serial#____________________  age:_________     []  First time [id number] user (complete trial)  K0004 time and # of strokes to propel 30 feet: ________seconds _________strokes  U0454 time and # of strokes to propel 30 feet: ________seconds _________strokes  What was the result of the trial between the K0004 and K0005 manual wheelchair? ___    What features of the K0005 w/c are needed as compared to the K0004 base? Why?___    []  adjustable seat and back angle changes the angle of seat slope of the frame to attain a gravity assisted position for efficient propulsion and proper weight distribution along the frame     []  the front of the wheelchair will be configured higher than the back of the chair to allow gravity to assist the user with postural stability  []  the center of the wheel will be positioned for stability, safety and efficient propulsion  []  adjustable axle allows for vertical, horizontal, camber and overall width changes  throughout the wheels for adjustment of the client's exact needs and abilities.   []  adjustable axle increases the stability and function of the chair allowing for adjustment of the center of gravity.   []  accommodates the client's anatomical position in the chair maximizing independence in mobility and maneuverability in all environments.   []  create a minimal fixed tilt-in space to assist in positioning.   []  Describe users full-time manual wheelchair activity patterns:___    []  Power assist Comments:  []  prevent repetitive use injuries  []  repetitive strain injury present in    shoulder girdle    []  shoulder pain is (> or =) to 7/10     during manual propulsion       Current Pain _____/10  []  requires conservation of energy to participate in MRADL(s) runable to propel up ramps or curbs using manual wheelchair  []  been K0005 user greater than one year  []  user unwilling to  use power      wheelchair (reason): []  less expensive option to power   wheelchair   []  rim activated power assist -      decreased  strength   []  Heavy duty manual wheelchair       K0006     Arm:    []  both []  right  []  left     Foot:   []  both []  right  []  left     []  hemi height required    []  Dependent base  []  user exceeds 250lbs  []  non-functional ambulator    []  extreme spasticity  []  over active movement   []  broken frame/hx of repeated     repairs  []  able to self-propel in residence       []  lower seat to floor height required  []  unable to self-propel in residence   []  Extra heavy duty manual wheelchair  K0007     Arm:    []  both []  right  []  left     Foot:   []  both []  right  []  left     []  hemi height required  []  Dependent base  []  user exceeds 300lbs  []  non-functional ambulator    []  able to self-propel in residence   []  lower seat to floor height required  []  unable to self-propel in residence     []  Manual wheelchair with tilt [phone]      (Manual "Tilt-n-Space")  []  patient is dependent for transfers  []  patient requires frequent       positioning for pressure relief   []  patient requires frequent      positioning for poor/absent trunk control        []  Stroller Base  []  infant/child   []  unable to propel manual      wheelchair  []  allows for growth  []  non-functional ambulator  []  non-functional UE  []  independent mobility is not a goal at this time    MANUAL FRAME OPTIONS      Push handles  []  extended   []  angle adjustable   []  standard  []  caregiver access  []  caregiver assist    []  allows "hooking" to enable      increased ability to perform ADLs or maintain balance   []  Angle Adjustable Back  []  postural control  []  control of tone/spasticity  []  accommodation of range of motion  []  UE functional control  []  accommodation for seating system    Rear wheel placement  []  std/fixed  [] fully adjustableramputee   []  camber ________degree  []  removable rear wheel  []  non-removable rear wheel  Wheel size _______  Wheel style_______________________  []  improved UE access to  wheels  []  increase propulsion ability  []  improved stability  []  changing angle in space for      improvement of postural stability  []  remove for transport    []  allow for seating system to fit on  base  []  amputee placement  []  1-arm drive access   r R  r L  []  enable propulsion of manual       wheelchair with one arm    []  amputee placement   Wheel rims/ Hand rims  []  Standard    []  Specialized-____ []  provide ability to propel manual   []  increase self-propulsion with hand wheelchair weakness/decreased grasp     []  Spoke protector/guard   []  prevent hands from getting caught in spokes   Tires:  []   pneumatic  []  flat free inserts  []  solid  Style:  []  decrease roll resistance              []  prevent frequent flats  []  increase shock absorbency  []  decrease maintenance   []  decrease pain from road shock    []  decrease spasms from road shock    Wheel Locks:    []  push []  pull []  scissor  []  lock wheels for transfers  []  lock wheels from rolling   Brake/wheel lock extension:  []  R  []  L  []  allow user to operate wheel locks due to decreased reach or strength   Caster housing:  Caster size:                      Style:                                          []  suspension fork  []  maneuverability   []  stability of wheelchair   []  durability  []  maintenance  []  angle adjustment for posture  []  allow for feet to come under        wheelchair base  []  allows change in seat to floor  height   []  increase shock absorbency  []  decrease pain from road shock  []  decrease spasms from road    shock   []  Side guards  []  prevent clothing getting caught in wheel or becoming soiled   [] provide hip and pelvic stability  []  eliminates contact between body and wheels  []  limit hand contact with wheels   []  Anti-tippers      []  prevent wheelchair from tipping    backward  []  assist caregiver with curbs     POWER MOBILITY      []  Scooter/POV    []  can safely operate   []  can safely  transfer   []  has adequate trunk stability   []  cannot functionally propel  manual wheelchair    []  Power mobility base    []  non-ambulatory   []  cannot functionally propel manual wheelchair   []  cannot functionally and safely      operate scooter/POV  []  can safely operate power       wheelchair  []  home is accessible  []  willing to use power wheelchair     Tilt  []  Powered tilt on powered chair  []  Powered tilt on manual chair  []  Manual tilt on manual chair Comments:  []  change position for pressure      []  elief/cannot weight shift   []  change position against      gravitational force on head and      shoulders   []  decrease pain  []  blood pressure management   []  control autonomic dysreflexia  []  decrease respiratory distress  []  management of spasticity  []  management of low tone  []  facilitate postural control   []  rest periods   []  control edema  []  increase sitting tolerance   []  aid with transfers     Recline   []  Power recline on power chair  []  Manual recline on manual chair  Comments:    []  intermittent catheterization  []  manage spasticity  []  accommodate femur to back angle  []  change position for pressure relief/cannot weight shift rhigh risk of pressure sore development  []  tilt alone does  not accomplish     effective pressure relief, maximum pressure relief achieved at -      _______ degrees tilt   _______ degrees recline   []  difficult to transfer to and from bed []  rest periods and sleeping in chair  []  repositioning for transfers  []  bring to full recline for ADL care  []  clothing/diaper changes in chair  []  gravity PEG tube feeding  []  head positioning  []  decrease pain  []  blood pressure management   []  control autonomic dysreflexia  []  decrease respiratory distress  []  user on ventilator     Elevator on mobility base  []  Power wheelchair  []  Scooter  []  increase Indep in transfers   []  increase Indep in ADLs    []  bathroom function and  safety  []  kitchen/cooking function and safety  []  shopping  []  raise height for communication at standing level  []  raise height for eye contact which reduces cervical neck strain and pain  []  drive at raised height for safety and navigating crowds  []  Other:   []  Vertical position system  (anterior tilt)     (Drive locks-out)    []  Stand       (Drive enabled)  []  independent weight bearing  []  decrease joint contractures  []  decrease/manage spasticity  []  decrease/manage spasms  []  pressure distribution away from   scapula, sacrum, coccyx, and ischial tuberosity  []  increase digestion and elimination   []  access to counters and cabinets  []  increase reach  []  increase interaction with others at eye level, reduces neck strain  []  increase performance of       MRADL(s)      Power elevating legrest    []  Center mount (Single) 85-170 degrees       []  Standard (Pair) 100-170 degrees  []  position legs at 90 degrees, not available with std power ELR  []  center mount tucks into chair to decrease turning radius in home, not available with std power ELR  []  provide change in position for LE  []  elevate legs during recline    []  maintain placement of feet on      footplate  []  decrease edema  []  improve circulation  []  actuator needed to elevate legrest  []  actuator needed to articulate legrest preventing knees from flexing  []  Increase ground clearance over      curbs  []   STD (pair) independently                     elevate legrest   POWER WHEELCHAIR CONTROLS      Controls/input device  []  Expandable  []  Non-expandable  []  Proportional  []  Right Hand []  Left Hand  []  Non-proportional/switches/head-array  []  Electrical/proximity         []   Mechanical      Manufacturer:___________________   Type:________________________ []  provides access for controlling wheelchair  []  programming for accurate control  []  progressive disease/changing condition  []  required for alternative  drive      controls       []  lacks motor control to operate  proportional drive control  []  unable to understand proportional controls  []  limited movement/strength  []  extraneous movement / tremors / ataxic / spastic       []  Upgraded electronics controller/harness    []  Single power (tilt or recline)   []  Expandable    []  Non-expandable plus   []  Multi-power (tilt, recline, power legrest, power seat  lift, vertical positioning system, stand)  []  allows input device to communicate with drive motors  []  harness provides necessary connections between the controller, input device, and seat functions     []  needed in order to operate power seat functions through joystick/ input device  []  required for alternative drive controls     []  Enhanced display  []  required to connect all alternative drive controls   []  required for upgraded joystick      (lite-throw, heavy duty, micro)  []  Allows user to see in which mode and drive the wheelchair is set; necessary for alternate controls       []  Upgraded tracking electronics  []  correct tracking when on uneven surfaces makes switch driving more efficient and less fatiguing  []  increase safety when driving  []  increase ability to traverse thresholds    []  Safety / reset / mode switches     Type:    []  Used to change modes and stop the wheelchair when driving     []  Mount for joystick / input device/switches  []  swing away for access or transfers   []  attaches joystick / input device / switches to wheelchair   []  provides for consistent access  []  midline for optimal placement    []  Attendant controlled joystick plus     mount  []  safety  []  long distance driving  []  operation of seat functions  []  compliance with transportation regulations    []  Battery  []  required to power (power assist / scooter/ power wc / other):   []  Power inverter (24V to 12V)  []  required for ventilator / respiratory equipment / other:     CHAIR OPTIONS MANUAL & POWER       Armrests   []  adjustable height []  removable  []  swing away []  fixed  []  flip back  []  reclining  []  full length pads []  desk []  tube arms []  gel pads  []  provide support with elbow at 90    []  remove/flip back/swing away for  transfers  []  provide support and positioning of upper body    []  allow to come closer to table top  []  remove for access to tables  []  provide support for w/c tray  []  change of height/angles for variable activities   []  Elbow support / Elbow stop  []  keep elbow positioned on arm pad  []  keep arms from falling off arm pad  during tilt and/or recline   Upper Extremity Support  []  Arm trough  []   R  []   L  Style:  []  swivel mount []  fixed mount   []  posterior hand support  []   tray  []  full tray  []  joystick cut out  []   R  []   L  Style:  []  decrease gravitational pull on      shoulders  []  provide support to increase UE  function  []  provide hand support in natural    position  []  position flaccid UE  []  decrease subluxation    []  decrease edema       []  manage spasticity   []  provide midline positioning  []  provide work surface  []  placement for AAC/ Computer/ EADL       Hangers/ Legrests   []  ______ degree  []  Elevating []  articulating  []  swing away []  fixed []  lift off  []  heavy duty  []  adjustable knee angle  []  adjustable calf panel   []  longer extension tube              []   provide LE support  []  maintain placement of feet on      footplate   []  accommodate lower leg length  []  accommodate to hamstring       tightness  []  enable transfers  []  provide change in position for LE's  []  elevate legs during recline    []  decrease edema  []  durability      Foot support   []  footplate []  R []  L []  flip up           []  Depth adjustable   []  angle adjustable  []  foot board/one piece    []  provide foot support  []  accommodate to ankle ROM  []  allow foot to go under wheelchair base  []  enable transfers     []  Shoe holders  []  position  foot    []  decrease / manage spasticity  []  control position of LE  []  stability    []  safety     []  Ankle strap/heel      loops  []  support foot on foot support  []  decrease extraneous movement  []  provide input to heel   []  protect foot     []  Amputee adapter []  R  []  L     Style:                  Size:  []  Provide support for stump/residual extremity    []  Transportation tie-down  []  to provide crash tested tie-down brackets    []  Crutch/cane holder    []  O2 holder    []  IV hanger   []  Ventilator tray/mount    []  stabilize accessory on wheelchair       Component  Justification     []  Seat cushion      []  accommodate impaired sensation  []  decubitus ulcers present or history  []  unable to shift weight  []  increase pressure distribution  []  prevent pelvic extension  []  custom required "off-the-shelf"    seat cushion will not accommodate deformity  []  stabilize/promote pelvis alignment  []  stabilize/promote femur alignment  []  accommodate obliquity  []  accommodate multiple deformity  []  incontinent/accidents  []  low maintenance     []  seat mounts                 []  fixed []  removable  []  attach seat platform/cushion to wheelchair frame    []  Seat wedge    []  provide increased aggressiveness of seat shape to decrease sliding  down in the seat  []  accommodate ROM        []  Cover replacement   []  protect back or seat cushion  []  incontinent/accidents    []  Solid seat / insert    []  support cushion to prevent      hammocking  []  allows attachment of cushion to mobility base    []  Lateral pelvic/thigh/hip     support (Guides)     []  decrease abduction  []  accommodate pelvis  []  position upper legs  []  accommodate spasticity  []  removable for transfers     []  Lateral pelvic/thigh      supports mounts  []  fixed   []  swing-away   []  removable  []  mounts lateral pelvic/thigh supports     []  mounts lateral pelvic/thigh supports swing-away or removable for transfers    []  Medial  thigh support ([place]mel)  [] decrease adduction  [] accommodate ROM  []  remove for transfers   []  alignment      []   Medial thigh   []  fixed      support mounts      []  swing-away   []  removable  []  mounts medial thigh supports   []  Mounts medial supports swing- away or removable for transfers       Component  Justification   []  Back       []  provide posterior trunk support []  facilitate tone  []  provide lumbar/sacral support []  accommodate deformity  []  support trunk in midline   []  custom required "off-the-shelf" back support will not accommodate deformity   []  provide lateral trunk support []  accommodate or decrease tone            []  Back mounts  []  fixed  []  removable  []  attach back rest/cushion to wheelchair frame   []  Lateral trunk      supports  []  R []  L  []  decrease lateral trunk leaning  []  accommodate asymmetry    []  contour for increased contact  []  safety    []  control of tone    []  Lateral trunk      supports mounts  []  fixed  []  swing-away   []  removable  []  mounts lateral trunk supports     []  Mounts lateral trunk supports swing-away or removable for transfers   []  Anterior chest      strap, vest     []  decrease forward movement of shoulder  []  decrease forward movement of trunk  []  safety/stability  []  added abdominal support  []  trunk alignment  []  assistance with shoulder control   []  decrease shoulder elevation    []  Headrest      []  provide posterior head support  []  provide posterior neck support  []  provide lateral head support  []  provide anterior head support  []  support during tilt and recline  []  improve feeding     []  improve respiration  []  placement of switches  []  safety    []  accommodate ROM   []  accommodate tone  []  improve visual orientation   []  Headrest           []  fixed []  removable []  flip down      Mounting hardware   []  swing-away laterals/switches  []  mount headrest   []  mounts headrest flip down or  removable for transfers  []   mount headrest swing-away laterals   []  mount switches     []  Neck Support    []  decrease neck rotation  []  decrease forward neck flexion   Pelvic Positioner    []  std hip belt          []  padded hip belt  []  dual pull hip belt  []  four point hip belt  []  stabilize tone  []  decrease falling out of chair  []  prevent excessive extension  []  special pull angle to control      rotation  []  pad for protection over boney   prominence  []  promote comfort    []  Essential needs        bag/pouch   []  medicines []  special food rorthotics []  clothing changes  []  diapers  []  catheter/hygiene []  ostomy supplies   The above equipment has a life- long use expectancy.  Growth and changes in medical and/or functional conditions would be the exceptions.   SUMMARY:  Why mobility device was selected; include why a lower level device is not appropriate: ***  ASSESSMENT:  CLINICAL IMPRESSION: Patient is a *** y.o. *** who was  seen today for physical therapy evaluation and treatment for ***.    OBJECTIVE IMPAIRMENTS {opptimpairments:25111}.   ACTIVITY LIMITATIONS {activitylimitations:27494}  PARTICIPATION LIMITATIONS: {participationrestrictions:25113}  PERSONAL FACTORS {Personal factors:25162} are also affecting patient's functional outcome.   REHAB POTENTIAL: {rehabpotential:25112}  CLINICAL DECISION MAKING: {clinical decision making:25114}  EVALUATION COMPLEXITY: {Evaluation complexity:25115}                                   GOALS: One time visit. No goals established.    PLAN: PT FREQUENCY: one time visit    Kristine Phalen, PT 09/01/2023, 11:04 AM    I concur with the above findings and recommendations of the therapist:  Physician name printed:         Physician's signature:      Date:

## 2023-09-24 NOTE — Discharge Summary (Signed)
 Physician Discharge Summary  Patient ID: Dominic Carter MRN: [id number] DOB/AGE: [birth year] 68 y.o.  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   aspirin  EC 81 MG tablet Take 81 mg by mouth daily.   levETIRAcetam  500 MG tablet Commonly known as: KEPPRA  Take 1 tablet (500 mg total) by mouth 2 (two) times daily.   losartan 25 MG tablet Commonly known as: COZAAR Take 1 tablet by mouth daily.   oxyCODONE  5 MG immediate release tablet Commonly known as: Oxy IR/ROXICODONE  Take 1 tablet (5 mg total) by mouth every 6 (six) hours as needed for severe pain (pain score 7-10) or moderate pain (pain score 4-6).   pravastatin  20 MG tablet Commonly known as: PRAVACHOL  Take 1 tablet (20 mg total) by mouth daily.       ASK your doctor about these medications    doxycycline  100 MG tablet Commonly known as: VIBRA -TABS Take 1 tablet (100 mg total) by mouth 2 (two) times daily for 21 days. Ask about: Should I take this medication?        Follow-up Information     Enid Harry, MD Follow up in 2 week(s).   Specialty: General Surgery Contact information: [address] [place] [address] [phone]                 Signed: Enid Harry 09/24/2023, 9:48 AM

## 2023-10-11 ENCOUNTER — Inpatient Hospital Stay (HOSPITAL_COMMUNITY)
Admission: EM | Admit: 2023-10-11 | Discharge: 2023-10-19 | DRG: 896 | Disposition: A | Discharge status: Skilled Nursing Facility | Attending: Internal Medicine | Admitting: Internal Medicine

## 2023-10-11 ENCOUNTER — Emergency Department (HOSPITAL_COMMUNITY)

## 2023-10-11 ENCOUNTER — Encounter (HOSPITAL_COMMUNITY): Payer: Self-pay | Admitting: *Deleted

## 2023-10-11 ENCOUNTER — Other Ambulatory Visit: Payer: Self-pay

## 2023-10-11 DIAGNOSIS — E7849 Other hyperlipidemia: Secondary | ICD-10-CM

## 2023-10-11 DIAGNOSIS — R000 Tachycardia, unspecified: Secondary | ICD-10-CM | POA: Diagnosis present

## 2023-10-11 DIAGNOSIS — R5381 Other malaise: Secondary | ICD-10-CM | POA: Diagnosis present

## 2023-10-11 DIAGNOSIS — Z87898 Personal history of other specified conditions: Secondary | ICD-10-CM

## 2023-10-11 DIAGNOSIS — E785 Hyperlipidemia, unspecified: Secondary | ICD-10-CM | POA: Diagnosis present

## 2023-10-11 DIAGNOSIS — F419 Anxiety disorder, unspecified: Secondary | ICD-10-CM | POA: Diagnosis present

## 2023-10-11 DIAGNOSIS — Z881 Allergy status to other antibiotic agents status: Secondary | ICD-10-CM

## 2023-10-11 DIAGNOSIS — Z89511 Acquired absence of right leg below knee: Secondary | ICD-10-CM | POA: Diagnosis not present

## 2023-10-11 DIAGNOSIS — Z7982 Long term (current) use of aspirin: Secondary | ICD-10-CM | POA: Diagnosis not present

## 2023-10-11 DIAGNOSIS — E871 Hypo-osmolality and hyponatremia: Secondary | ICD-10-CM | POA: Diagnosis present

## 2023-10-11 DIAGNOSIS — F10939 Alcohol use, unspecified with withdrawal, unspecified: Secondary | ICD-10-CM | POA: Diagnosis present

## 2023-10-11 DIAGNOSIS — Z79899 Other long term (current) drug therapy: Secondary | ICD-10-CM

## 2023-10-11 DIAGNOSIS — L89313 Pressure ulcer of right buttock, stage 3: Secondary | ICD-10-CM | POA: Diagnosis present

## 2023-10-11 DIAGNOSIS — Z8249 Family history of ischemic heart disease and other diseases of the circulatory system: Secondary | ICD-10-CM

## 2023-10-11 DIAGNOSIS — R4182 Altered mental status, unspecified: Secondary | ICD-10-CM | POA: Diagnosis not present

## 2023-10-11 DIAGNOSIS — Z86718 Personal history of other venous thrombosis and embolism: Secondary | ICD-10-CM | POA: Diagnosis not present

## 2023-10-11 DIAGNOSIS — Z9109 Other allergy status, other than to drugs and biological substances: Secondary | ICD-10-CM | POA: Diagnosis not present

## 2023-10-11 DIAGNOSIS — R569 Unspecified convulsions: Principal | ICD-10-CM | POA: Diagnosis present

## 2023-10-11 DIAGNOSIS — F17210 Nicotine dependence, cigarettes, uncomplicated: Secondary | ICD-10-CM | POA: Diagnosis present

## 2023-10-11 DIAGNOSIS — L89323 Pressure ulcer of left buttock, stage 3: Secondary | ICD-10-CM | POA: Diagnosis present

## 2023-10-11 DIAGNOSIS — Y900 Blood alcohol level of less than 20 mg/100 ml: Secondary | ICD-10-CM | POA: Diagnosis present

## 2023-10-11 DIAGNOSIS — D696 Thrombocytopenia, unspecified: Secondary | ICD-10-CM | POA: Diagnosis present

## 2023-10-11 DIAGNOSIS — F10139 Alcohol abuse with withdrawal, unspecified: Secondary | ICD-10-CM | POA: Diagnosis present

## 2023-10-11 DIAGNOSIS — I69398 Other sequelae of cerebral infarction: Secondary | ICD-10-CM | POA: Diagnosis not present

## 2023-10-11 DIAGNOSIS — I10 Essential (primary) hypertension: Secondary | ICD-10-CM | POA: Diagnosis present

## 2023-10-11 DIAGNOSIS — Z8673 Personal history of transient ischemic attack (TIA), and cerebral infarction without residual deficits: Secondary | ICD-10-CM

## 2023-10-11 DIAGNOSIS — F10930 Alcohol use, unspecified with withdrawal, uncomplicated: Secondary | ICD-10-CM | POA: Diagnosis not present

## 2023-10-11 LAB — COMPREHENSIVE METABOLIC PANEL WITH GFR
ALT: 26 U/L (ref 0–44)
AST: 41 U/L (ref 15–41)
Albumin: 3.2 g/dL — ABNORMAL LOW (ref 3.5–5.0)
Alkaline Phosphatase: 83 U/L (ref 38–126)
Anion gap: 11 (ref 5–15)
BUN: 9 mg/dL (ref 8–23)
CO2: 23 mmol/L (ref 22–32)
Calcium: 8.3 mg/dL — ABNORMAL LOW (ref 8.9–10.3)
Chloride: 104 mmol/L (ref 98–111)
Creatinine, Ser: 0.7 mg/dL (ref 0.61–1.24)
GFR, Estimated: >60 mL/min (ref >60)
Glucose, Bld: 103 mg/dL — ABNORMAL HIGH (ref 70–99)
Potassium: 3.8 mmol/L (ref 3.5–5.1)
Sodium: 138 mmol/L (ref 135–145)
Total Bilirubin: 1 mg/dL (ref 0.0–1.2)
Total Protein: 7.6 g/dL (ref 6.5–8.1)

## 2023-10-11 LAB — CBC
HCT: 34.5 % — ABNORMAL LOW (ref 39.0–52.0)
Hemoglobin: 11.1 g/dL — ABNORMAL LOW (ref 13.0–17.0)
MCH: 28.6 pg (ref 26.0–34.0)
MCHC: 32.2 g/dL (ref 30.0–36.0)
MCV: 88.9 fL (ref 80.0–100.0)
Platelets: 156 K/uL (ref 150–400)
RBC: 3.88 MIL/uL — ABNORMAL LOW (ref 4.22–5.81)
RDW: 19 % — ABNORMAL HIGH (ref 11.5–15.5)
WBC: 6.5 K/uL (ref 4.0–10.5)
nRBC: 0 % (ref 0.0–0.2)

## 2023-10-11 LAB — CBG MONITORING, ED: Glucose-Capillary: 125 mg/dL — ABNORMAL HIGH (ref 70–99)

## 2023-10-11 LAB — ETHANOL: Alcohol, Ethyl (B): <15 mg/dL (ref <15)

## 2023-10-11 MED ORDER — HYDRALAZINE HCL 20 MG/ML IJ SOLN: 10.0000 mg | Freq: Four times a day (QID) | INTRAMUSCULAR | Status: DC | PRN

## 2023-10-11 MED ORDER — LEVETIRACETAM (KEPPRA) 500 MG/5 ML ADULT IV PUSH
1500.0000 mg | Freq: Once | INTRAVENOUS | Status: AC
  Administered 2023-10-11: 1500 mg via INTRAVENOUS
  Filled 2023-10-11: qty 15

## 2023-10-11 MED ORDER — SODIUM CHLORIDE 0.9% FLUSH: 3.0000 mL | INTRAVENOUS | Status: DC | PRN

## 2023-10-11 MED ORDER — LEVETIRACETAM (KEPPRA) 500 MG/5 ML ADULT IV PUSH: 500.0000 mg | Freq: Two times a day (BID) | INTRAVENOUS | Status: DC

## 2023-10-11 MED ORDER — LORAZEPAM 2 MG/ML IJ SOLN
2.0000 mg | Freq: Four times a day (QID) | INTRAMUSCULAR | Status: DC | PRN
  Administered 2023-10-13: 2 mg via INTRAVENOUS
  Filled 2023-10-11 (×10): qty 1

## 2023-10-11 MED ORDER — ADULT MULTIVITAMIN W/MINERALS CH
1.0000 | ORAL_TABLET | Freq: Every day | ORAL | Status: DC
  Administered 2023-10-12 – 2023-10-19 (×8): 1 via ORAL
  Filled 2023-10-11 (×8): qty 1

## 2023-10-11 MED ORDER — ACETAMINOPHEN 650 MG RE SUPP: 650.0000 mg | Freq: Four times a day (QID) | RECTAL | Status: DC | PRN

## 2023-10-11 MED ORDER — LOSARTAN POTASSIUM 25 MG PO TABS
25.0000 mg | ORAL_TABLET | Freq: Every day | ORAL | Status: DC
  Administered 2023-10-12 – 2023-10-15 (×4): 25 mg via ORAL
  Filled 2023-10-11 (×4): qty 1

## 2023-10-11 MED ORDER — ONDANSETRON HCL 4 MG/2ML IJ SOLN
4.0000 mg | Freq: Once | INTRAMUSCULAR | Status: AC
  Administered 2023-10-11: 4 mg via INTRAVENOUS
  Filled 2023-10-11: qty 2

## 2023-10-11 MED ORDER — LORAZEPAM 2 MG/ML IJ SOLN: 2.0000 mg | Freq: Once | INTRAMUSCULAR | Status: DC

## 2023-10-11 MED ORDER — ONDANSETRON HCL 4 MG PO TABS: 4.0000 mg | ORAL_TABLET | Freq: Four times a day (QID) | ORAL | Status: DC | PRN

## 2023-10-11 MED ORDER — LACTATED RINGERS IV SOLN: INTRAVENOUS | Status: DC

## 2023-10-11 MED ORDER — SODIUM CHLORIDE 0.9% FLUSH
3.0000 mL | Freq: Two times a day (BID) | INTRAVENOUS | Status: DC
  Administered 2023-10-11 – 2023-10-19 (×15): 3 mL via INTRAVENOUS

## 2023-10-11 MED ORDER — ONDANSETRON HCL 4 MG/2ML IJ SOLN
4.0000 mg | Freq: Four times a day (QID) | INTRAMUSCULAR | Status: DC | PRN
  Administered 2023-10-11: 4 mg via INTRAVENOUS
  Filled 2023-10-11: qty 2

## 2023-10-11 MED ORDER — ASPIRIN 81 MG PO TBEC
81.0000 mg | DELAYED_RELEASE_TABLET | Freq: Every day | ORAL | Status: DC
  Administered 2023-10-12 – 2023-10-19 (×8): 81 mg via ORAL
  Filled 2023-10-11 (×8): qty 1

## 2023-10-11 MED ORDER — SODIUM CHLORIDE 0.9% FLUSH
3.0000 mL | Freq: Two times a day (BID) | INTRAVENOUS | Status: DC
  Administered 2023-10-11 – 2023-10-19 (×9): 3 mL via INTRAVENOUS

## 2023-10-11 MED ORDER — LEVETIRACETAM (KEPPRA) 500 MG/5 ML ADULT IV PUSH
500.0000 mg | Freq: Two times a day (BID) | INTRAVENOUS | Status: DC
  Administered 2023-10-12: 500 mg via INTRAVENOUS
  Filled 2023-10-11 (×2): qty 5

## 2023-10-11 MED ORDER — SODIUM CHLORIDE 0.9 % IV SOLN
250.0000 mL | INTRAVENOUS | Status: AC | PRN
Start: 2023-10-11 — End: 2023-10-12

## 2023-10-11 MED ORDER — LORAZEPAM 2 MG/ML IJ SOLN
1.0000 mg | INTRAMUSCULAR | Status: DC | PRN
  Administered 2023-10-12 (×4): 2 mg via INTRAVENOUS

## 2023-10-11 MED ORDER — PROCHLORPERAZINE EDISYLATE 10 MG/2ML IJ SOLN: 10.0000 mg | Freq: Four times a day (QID) | INTRAMUSCULAR | Status: DC | PRN

## 2023-10-11 MED ORDER — ACETAMINOPHEN 325 MG PO TABS
650.0000 mg | ORAL_TABLET | Freq: Four times a day (QID) | ORAL | Status: DC | PRN
  Administered 2023-10-17: 650 mg via ORAL
  Filled 2023-10-11: qty 2

## 2023-10-11 MED ORDER — FOLIC ACID 1 MG PO TABS
1.0000 mg | ORAL_TABLET | Freq: Every day | ORAL | Status: DC
  Administered 2023-10-12 – 2023-10-19 (×8): 1 mg via ORAL
  Filled 2023-10-11 (×8): qty 1

## 2023-10-11 MED ORDER — SODIUM CHLORIDE 0.9 % IV SOLN: 250.0000 mL | INTRAVENOUS | Status: AC | PRN

## 2023-10-11 MED ORDER — PRAVASTATIN SODIUM 20 MG PO TABS
20.0000 mg | ORAL_TABLET | Freq: Every day | ORAL | Status: DC
  Administered 2023-10-12 – 2023-10-19 (×8): 20 mg via ORAL
  Filled 2023-10-11 (×8): qty 1

## 2023-10-11 MED ORDER — ENOXAPARIN SODIUM 40 MG/0.4ML IJ SOSY
40.0000 mg | PREFILLED_SYRINGE | INTRAMUSCULAR | Status: DC
  Administered 2023-10-12 – 2023-10-19 (×8): 40 mg via SUBCUTANEOUS
  Filled 2023-10-11 (×8): qty 0.4

## 2023-10-11 MED ORDER — LORAZEPAM 1 MG PO TABS
1.0000 mg | ORAL_TABLET | ORAL | Status: DC | PRN
  Administered 2023-10-13 (×2): 2 mg via ORAL
  Filled 2023-10-11 (×2): qty 2

## 2023-10-11 MED ORDER — THIAMINE HCL 100 MG/ML IJ SOLN: 100.0000 mg | Freq: Every day | INTRAMUSCULAR | Status: DC

## 2023-10-11 MED ORDER — THIAMINE MONONITRATE 100 MG PO TABS
100.0000 mg | ORAL_TABLET | Freq: Every day | ORAL | Status: DC
  Administered 2023-10-12 – 2023-10-19 (×8): 100 mg via ORAL
  Filled 2023-10-11 (×8): qty 1

## 2023-10-11 NOTE — ED Triage Notes (Signed)
 BIB [facility] from home for sz. H/o sz. Was on meds but stopped by prescriber. Last sz was February. 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 No h/o epilepsy. ETOH yesterday. Family verbalized concern for other substances. VSS. HR 100. CBG 113.NSL 18g L FA. A&Ox3. Alert, NAD, calm, interactive. No sz activity for EMS.

## 2023-10-11 NOTE — ED Notes (Signed)
 Seizure pads attached to the bedrails

## 2023-10-11 NOTE — ED Notes (Addendum)
 Pt was found standing in his room by ED staff, and witnessed the pt fall. Pt states that he was trying to get the urinal on the bedside table. Bed rails were up, and the the nurse call button was within reach during the event. Pt reported no new injuries, and staff noted on new obvious deformities/bruising.

## 2023-10-11 NOTE — H&P (Addendum)
 History and Physical    [name]:[id number] DOB: [birth year] DOA: 10/11/2023  PCP: Leron Millman, NP   Patient coming from: Home   Chief Complaint:  Chief Complaint  Patient presents with   Seizures   ED TRIAGE note:  BIB [facility] from home for sz. H/o sz. Was on meds but stopped by prescriber. Last sz was February. 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 No h/o epilepsy. ETOH yesterday. Family verbalized concern for other substances. VSS. HR 100. CBG 113.NSL 18g L FA. A&Ox3. Alert, NAD, calm, interactive. No sz activity for EM    HPI:  Dominic Carter is a 68 y.o. 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 Family reported patient is using alcohol and concern for other substance use as well.  Last drink of alcohol yesterday. Per chart neurology from [facility] impression was patient has alcohol withdrawal related seizure.  Today patient has total 2 episodes of seizure 1 at home and another episode while in the emergency department. In the ED neurology has been consulted recommended to load with Keppra  1500 mg IV and start Keppra  500 mg twice daily and continue outpatient as well.  Recommended observe for several hours for ensure no seizure activity.  Per neurology if patient develops more seizure or does not return to his baseline then neurology needs to be involved.  Neurology did not recommended transfer patient to [facility] neither any recommendation for EEG.  Per neurology it is unclear if patient have seizure secondary to previous CVA versus alcohol withdrawal.   During my evaluation at the bedside patient is alert and oriented.  Patient stated that he had a seizure at home which brought him to the ED for evaluation-able to recall the event.  During my  evaluation at the bedside patient suddenly started vomiting which is greenish in color.  Patient denies any abdominal pain. Patient's RN stated in the ED patient suddenly became tachycardic heart rate up to 160 and found postictal however no one actually witnessed the seizure in the ED. Patient reported drinking alcohol however unable to quantify how much and what he has been drinking.  Unable to disclose any drug use history.   ED Course:  At presentation to ED patient is hemodynamically stable. CMP unremarkable. CBC unremarkable stable H&H.  Blood alcohol level less than 15.  UDS pending.  Blood glucose within normal range.  Chest x-ray no active disease process. CT head no acute intracranial abnormality.Stable right temporoparietal encephalomalacia.   In the ED patient has been given Keppra  load 1500 mg.  Hospitalist has been consulted for further management of seizure and alcohol withdrawal.  Significant labs in the ED: Lab Orders         Comprehensive metabolic panel         Ethanol         Urine rapid drug screen (hosp performed)         CBC         HIV Antibody (routine testing w rflx)         CBC         Comprehensive metabolic panel         CBG monitoring, ED       Review of Systems:  Review of Systems  Constitutional:  Negative for malaise/fatigue.  Respiratory:  Negative for cough.   Cardiovascular:  Negative for chest pain and palpitations.  Gastrointestinal:  Positive for nausea and vomiting.  Neurological:  Positive for seizures. Negative for dizziness, focal weakness, loss of consciousness, weakness and headaches.  Psychiatric/Behavioral:  The patient is not nervous/anxious.     Past Medical History:  Diagnosis Date   DVT (deep venous thrombosis) (HCC)    Dyslipidemia    ETOH abuse    Hx of BKA, right (HCC)    Hypertension    Seizure disorder (HCC)    Stroke (HCC) 2022    Past Surgical History:  Procedure Laterality Date   BELOW KNEE LEG AMPUTATION  Right    HERNIA REPAIR Right 2022   INCISION AND DRAINAGE ABSCESS Right 08/03/2023   Procedure: INCISION AND DRAINAGE, ABSCESS;  Surgeon: Ebbie Cough, MD;  Location: [facility] OR;  Service: General;  Laterality: Right;  TAP BLOCK excision right groin wound and abscess drainage   IRRIGATION AND DEBRIDEMENT ABSCESS Right 01/06/2022   Procedure: IRRIGATION AND DEBRIDEMENT ABSCESS;  Surgeon: Ebbie Cough, MD;  Location: [facility] OR;  Service: General;  Laterality: Right;     reports that he has been smoking cigarettes. He has never used smokeless tobacco. He reports current alcohol use of about 3.0 standard drinks of alcohol per week. He reports that he does not use drugs.  Allergies  Allergen Reactions   Sulfa  Antibiotics Rash   Castor Oil Nausea And Vomiting   Other Nausea And Vomiting and Other (See Comments)    Boiled Fat back meat  - can't chew     Family History  Problem Relation Age of Onset   Hypertension Mother    Hypertension Sister    Hypertension Brother    Hypertension Maternal Grandmother    Hypertension Maternal Grandfather     Prior to Admission medications   Medication Sig Start Date End Date Taking? Authorizing Provider  amLODipine  (NORVASC ) 10 MG tablet Take 10 mg by mouth daily. 08/18/19   [provider]  aspirin  EC 81 MG tablet Take 81 mg by mouth daily. 10/08/22   [provider]  levETIRAcetam  (KEPPRA ) 500 MG tablet Take 1 tablet (500 mg total) by mouth 2 (two) times daily. 01/10/22   Danford, Lonni SQUIBB, MD  losartan  (COZAAR ) 25 MG tablet Take 1 tablet by mouth daily. 07/15/23 01/11/24  [provider]  oxyCODONE  (OXY IR/ROXICODONE ) 5 MG immediate release tablet Take 1 tablet (5 mg total) by mouth every 6 (six) hours as needed for severe pain (pain score 7-10) or moderate pain (pain score 4-6). 08/03/23   Ebbie Cough, MD  pravastatin  (PRAVACHOL ) 20 MG tablet Take 1 tablet (20 mg total) by mouth daily. 01/10/22   Jonel Lonni SQUIBB,  MD     Physical Exam: Vitals:   10/11/23 1630 10/11/23 1730 10/11/23 2036 10/11/23 2155  BP: (!) 163/92 (!) 158/82  (!) 145/82  Pulse: 79 82 (!) 121 85  Resp: 18 16 (!) 23 16  Temp:   98.1 F (36.7 C)   TempSrc:      SpO2: 99% 95% (!) 72% 96%  Weight:        Physical Exam Constitutional:      General: He is not in acute distress.    Appearance: He is ill-appearing. He is not toxic-appearing.     Comments: Ill-appearing and malnourished  HENT:     Mouth/Throat:     Mouth: Mucous membranes are dry.  Eyes:     Pupils: Pupils are equal, round,  and reactive to light.  Cardiovascular:     Rate and Rhythm: Regular rhythm. Tachycardia present.  Pulmonary:     Effort: Pulmonary effort is normal.     Breath sounds: Normal breath sounds.  Abdominal:     General: There is no distension.     Tenderness: There is no abdominal tenderness.  Musculoskeletal:     Cervical back: Neck supple.  Skin:    General: Skin is dry.     Capillary Refill: Capillary refill takes less than 2 seconds.  Neurological:     Mental Status: He is oriented to person, place, and time.     Motor: No weakness.  Psychiatric:        Mood and Affect: Mood normal.        Behavior: Behavior normal.        Thought Content: Thought content normal.      Labs on Admission: I have personally reviewed following labs and imaging studies  CBC: Recent Labs  Lab 10/11/23 1603  WBC 6.5  HGB 11.1*  HCT 34.5*  MCV 88.9  PLT 156   Basic Metabolic Panel: Recent Labs  Lab 10/11/23 1603  NA 138  K 3.8  CL 104  CO2 23  GLUCOSE 103*  BUN 9  CREATININE 0.70  CALCIUM  8.3*   GFR: Estimated Creatinine Clearance: 82.3 mL/min (by C-G formula based on SCr of 0.7 mg/dL). Liver Function Tests: Recent Labs  Lab 10/11/23 1603  AST 41  ALT 26  ALKPHOS 83  BILITOT 1.0  PROT 7.6  ALBUMIN 3.2*   No results for input(s): LIPASE, AMYLASE in the last 168 hours. No results for input(s): AMMONIA in the last  168 hours. Coagulation Profile: No results for input(s): INR, PROTIME in the last 168 hours. Cardiac Enzymes: No results for input(s): CKTOTAL, CKMB, CKMBINDEX, TROPONINI, TROPONINIHS in the last 168 hours. BNP (last 3 results) No results for input(s): BNP in the last 8760 hours. HbA1C: No results for input(s): HGBA1C in the last 72 hours. CBG: Recent Labs  Lab 10/11/23 2049  GLUCAP 125*   Lipid Profile: No results for input(s): CHOL, HDL, LDLCALC, TRIG, CHOLHDL, LDLDIRECT in the last 72 hours. Thyroid Function Tests: No results for input(s): TSH, T4TOTAL, FREET4, T3FREE, THYROIDAB in the last 72 hours. Anemia Panel: No results for input(s): VITAMINB12, FOLATE, FERRITIN, TIBC, IRON, RETICCTPCT in the last 72 hours. Urine analysis:    Component Value Date/Time   COLORURINE STRAW (A) 07/13/2023 1428   APPEARANCEUR CLEAR 07/13/2023 1428   LABSPEC 1.003 (L) 07/13/2023 1428   PHURINE 7.0 07/13/2023 1428   GLUCOSEU NEGATIVE 07/13/2023 1428   HGBUR SMALL (A) 07/13/2023 1428   BILIRUBINUR NEGATIVE 07/13/2023 1428   KETONESUR NEGATIVE 07/13/2023 1428   PROTEINUR 100 (A) 07/13/2023 1428   NITRITE NEGATIVE 07/13/2023 1428   LEUKOCYTESUR NEGATIVE 07/13/2023 1428    Radiological Exams on Admission: I have personally reviewed images CT HEAD WO CONTRAST ( ) Result Date: 10/11/2023 CLINICAL DATA:  Seizure disorder, clinical change EXAM: CT HEAD WITHOUT CONTRAST TECHNIQUE: Contiguous axial images were obtained from the base of the skull through the vertex without intravenous contrast. RADIATION DOSE REDUCTION: This exam was performed according to the departmental dose-optimization program which includes automated exposure control, adjustment of the mA and/or kV according to patient size and/or use of iterative reconstruction technique. COMPARISON:  07/13/2023 FINDINGS: Brain: Area of encephalomalacia within the posterior right temporal and  parietal lobes again noted, unchanged. There is atrophy and chronic small vessel disease changes.  No acute intracranial abnormality. Specifically, no hemorrhage, hydrocephalus, mass lesion, acute infarction, or significant intracranial injury. Vascular: No hyperdense vessel or unexpected calcification. Skull: No acute calvarial abnormality. Sinuses/Orbits: No acute findings Other: None IMPRESSION: Atrophy, chronic microvascular disease. No acute intracranial abnormality. Stable right temporoparietal encephalomalacia. Electronically Signed   By: Franky Crease M.D.   On: 10/11/2023 21:25   DG Chest Portable 1 View Result Date: 10/11/2023 CLINICAL DATA:  ams EXAM: PORTABLE CHEST 1 VIEW COMPARISON:  None Available. FINDINGS: The heart and mediastinal contours are within normal limits. No focal consolidation. No pulmonary edema. No pleural effusion. No pneumothorax. No acute osseous abnormality. IMPRESSION: No active disease. Electronically Signed   By: Morgane  Naveau M.D.   On: 10/11/2023 21:12     EKG: My personal interpretation of EKG shows: Normal sinus rhythm heart rate 83, abnormal R progression.    Assessment/Plan: Principal Problem:   Seizure due to alcohol withdrawal (HCC) Active Problems:   Seizure (HCC)   Essential hypertension   History of CVA (cerebrovascular accident)   Hyperlipidemia   Alcohol withdrawal (HCC)   Altered mental status-secondary to seizure and alcohol withdrawal    Assessment and Plan: Seizure secondary to alcohol withdrawal History of seizure Altered mental status-confusion in the setting of seizure and alcohol withdrawal -Present emergency department with complaining of seizure.  Family reported 1 witnessed seizure and while in the ED patient has another episode of generalized tonic-clonic seizure.  Previous history of seizure thought to be secondary to alcohol withdrawal and patient used to be on Keppra  which has been discontinued by outpatient neurology at some  point.  Family also concerned patient has been using some other substance in congestion with alcohol. -In addition to ED patient is hemodynamically stable and confused.  Unable to quantify how much she has been drinking. -CBC and CMP unremarkable.  Blood alcohol level less than 15.  Pending UDS.  Blood glucose within normal range. - Chest x-ray unremarkable.  No evidence of aspiration. - CT head showed atrophy, chronic microvascular disease.  No acute intracranial abnormality.Stable right temporoparietal encephalomalacia.  -Dr. Patsey discussed the case with on-call neurology Dr. Vanessa recommended Keppra  load 1500 mg followed by continue Keppra  500 mg twice daily.  Need to observe several hour for seizure episode.  Recommended if patient does not goes back to baseline then neurology needs to be involved for formal consult.  Recommended no driving for 6 months and has to be seizure-free before resuming driving. -Admitting patient for further management of seizure - In the ED loaded with Keppra  1500 mg. - Continue IV Keppra  500 mg twice daily. - Continue Ativan  2 mg as needed. -Obtaining spot EEG. - Continue seizure precaution, aspiration precaution and fall precaution. - Continue Ativan  dapper as needed for alcohol withdrawal. -Continue cardiac monitoring   Chronic alcohol use Alcohol withdrawal complicated with seizure -Continue CIWA protocol with Ativan  as needed tapper. - Continue thiamine , folic acid  and multivitamin.  Essential hypertension -Continue losartan .  Continue hydralazine  as needed  Hyperlipidemia -Continue Lipitor   History of CVA -Continue aspirin  and Lipitor .  Nausea and vomiting - Patient has 3-4 episodes of biliary nonbloody vomiting in the ED.  Continue Zofran  and Compazine  as needed.   DVT prophylaxis:  Lovenox  Code Status:  Full Code Diet: Heart healthy diet Family Communication:   Family was present at bedside, at the time of interview. Opportunity  was given to ask question and all questions were answered satisfactorily.  Disposition Plan: If redevelops seizure need to reach  out to neurology for formal consult. Consults:   None Admission status:   Inpatient, Step Down Unit  Severity of Illness: The appropriate patient status for this patient is INPATIENT. Inpatient status is judged to be reasonable and necessary in order to provide the required intensity of service to ensure the patient's safety. The patient's presenting symptoms, physical exam findings, and initial radiographic and laboratory data in the context of their chronic comorbidities is felt to place them at high risk for further clinical deterioration. Furthermore, it is not anticipated that the patient will be medically stable for discharge from the hospital within 2 midnights of admission.   * I certify that at the point of admission it is my clinical judgment that the patient will require inpatient hospital care spanning beyond 2 midnights from the point of admission due to high intensity of service, high risk for further deterioration and high frequency of surveillance required.DEWAINE    Leory Allinson, MD [facility]  How to contact the [facility] Attending or Consulting provider 7A - 7P or covering provider during after hours 7P -7A, for this patient.  Check the care team in [facility] and look for a) attending/consulting [facility] provider listed and b) the [facility] team listed Log into [email or web address] and use [facility]'s universal password to access. If you do not have the password, please contact the hospital operator. Locate the [facility] provider you are looking for under [facility] and page to a number that you can be directly reached. If you still have difficulty reaching the provider, please page the [facility] (Director on Call) for the Hospitalists listed on amion for assistance.  10/11/2023, 10:48 PM

## 2023-10-11 NOTE — Plan of Care (Addendum)
 Brief Neuro Note:  68 y.o. male with PMH significant for previous right posterior MCA cortical infarct (apparently unknown to patient), EtOh use who presents with two seizures. Was on keppra  500mg  BID but that was stopped by outpatient neurology.  He is improving, post ictal at this time.  Plan is to load him with Keppra  1500mg  IV once and start Keppra  500mg  BID. Recommend continuing Keppra  500mg  BID outpatient.  Observe for several hour to ensure no seizure clustering. Do not think we need to see him. If he is having more seizures or not returning back to his baseline, then we should be involved. No driving for 6 months. Has to be seizure free before he can resume driving.   Plan discussed with Dr. Patsey with the ED team at [facility].  Dominic Carter [facility]

## 2023-10-11 NOTE — ED Provider Notes (Signed)
 [place] EMERGENCY DEPARTMENT AT [facility] Provider Note   CSN: [id number] Arrival date & time: 10/11/23  1506     Patient presents with: Seizures   Dominic Carter is a 68 y.o. male.    Seizures  Patient history of seizures thought to be related to alcohol abuse.  Had reported seizure at home.  Reportedly had seizures in the past but all related to alcohol use.  Had been on Keppra  for a while due to previous stroke.  Reviewed neurology note in the head canceled the Keppra .  Reportedly has been drinking.  However still has some confusion.    Past Medical History:  Diagnosis Date   DVT (deep venous thrombosis) (HCC)    Dyslipidemia    ETOH abuse    Hx of BKA, right (HCC)    Hypertension    Seizure disorder (HCC)    Stroke (HCC) 2022    Prior to Admission medications   Medication Sig Start Date End Date Taking? Authorizing Provider  amLODipine  (NORVASC ) 10 MG tablet Take 10 mg by mouth daily. 08/18/19   [provider]  aspirin  EC 81 MG tablet Take 81 mg by mouth daily. 10/08/22   [provider]  levETIRAcetam  (KEPPRA ) 500 MG tablet Take 1 tablet (500 mg total) by mouth 2 (two) times daily. 01/10/22   Danford, Lonni SQUIBB, MD  losartan  (COZAAR ) 25 MG tablet Take 1 tablet by mouth daily. 07/15/23 01/11/24  [provider]  oxyCODONE  (OXY IR/ROXICODONE ) 5 MG immediate release tablet Take 1 tablet (5 mg total) by mouth every 6 (six) hours as needed for severe pain (pain score 7-10) or moderate pain (pain score 4-6). 08/03/23   Ebbie Cough, MD  pravastatin  (PRAVACHOL ) 20 MG tablet Take 1 tablet (20 mg total) by mouth daily. 01/10/22   Danford, Lonni SQUIBB, MD    Allergies: Sulfa  antibiotics, Castor oil, and Other    Review of Systems  Neurological:  Positive for seizures.    Updated Vital Signs BP (!) 158/82   Pulse (!) 121   Temp 98.1 F (36.7 C)   Resp (!) 23   Wt 65.8 kg   SpO2 (!) 72%   BMI 20.22 kg/m   Physical  Exam Vitals reviewed.  Cardiovascular:     Rate and Rhythm: Normal rate.  Pulmonary:     Breath sounds: No wheezing or rhonchi.  Musculoskeletal:        General: No tenderness.     Comments: Right Below the knee amputation.  Neurological:     Mental Status: He is alert.     Comments: Awake and pleasant, but some confusion.     (all labs ordered are listed, but only abnormal results are displayed) Labs Reviewed  COMPREHENSIVE METABOLIC PANEL WITH GFR - Abnormal; Notable for the following components:      Result Value   Glucose, Bld 103 (*)    Calcium  8.3 (*)    Albumin 3.2 (*)    All other components within normal limits  CBC - Abnormal; Notable for the following components:   RBC 3.88 (*)    Hemoglobin 11.1 (*)    HCT 34.5 (*)    RDW 19.0 (*)    All other components within normal limits  CBG MONITORING, ED - Abnormal; Notable for the following components:   Glucose-Capillary 125 (*)    All other components within normal limits  ETHANOL  RAPID URINE DRUG SCREEN, HOSP PERFORMED    EKG: EKG Interpretation Date/Time:  Sunday October 11 2023 15:41:19 EDT Ventricular Rate:  83 PR Interval:  147 QRS Duration:  84 QT Interval:  402 QTC Calculation: 473 R Axis:   16  Text Interpretation: Sinus rhythm Abnormal R-wave progression, early transition No significant change since last tracing Confirmed by Patsey Lot [phone]) on 10/11/2023 4:43:26 PM  Radiology: CT HEAD WO CONTRAST ( ) Result Date: 10/11/2023 CLINICAL DATA:  Seizure disorder, clinical change EXAM: CT HEAD WITHOUT CONTRAST TECHNIQUE: Contiguous axial images were obtained from the base of the skull through the vertex without intravenous contrast. RADIATION DOSE REDUCTION: This exam was performed according to the departmental dose-optimization program which includes automated exposure control, adjustment of the mA and/or kV according to patient size and/or use of iterative reconstruction technique. COMPARISON:   07/13/2023 FINDINGS: Brain: Area of encephalomalacia within the posterior right temporal and parietal lobes again noted, unchanged. There is atrophy and chronic small vessel disease changes. No acute intracranial abnormality. Specifically, no hemorrhage, hydrocephalus, mass lesion, acute infarction, or significant intracranial injury. Vascular: No hyperdense vessel or unexpected calcification. Skull: No acute calvarial abnormality. Sinuses/Orbits: No acute findings Other: None IMPRESSION: Atrophy, chronic microvascular disease. No acute intracranial abnormality. Stable right temporoparietal encephalomalacia. Electronically Signed   By: Franky Crease M.D.   On: 10/11/2023 21:25   DG Chest Portable 1 View Result Date: 10/11/2023 CLINICAL DATA:  ams EXAM: PORTABLE CHEST 1 VIEW COMPARISON:  None Available. FINDINGS: The heart and mediastinal contours are within normal limits. No focal consolidation. No pulmonary edema. No pleural effusion. No pneumothorax. No acute osseous abnormality. IMPRESSION: No active disease. Electronically Signed   By: Morgane  Naveau M.D.   On: 10/11/2023 21:12     Procedures   Medications Ordered in the ED  ondansetron  (ZOFRAN ) injection 4 mg (4 mg Intravenous Given 10/11/23 2100)  levETIRAcetam  (KEPPRA ) undiluted injection 1,500 mg (1,500 mg Intravenous Given 10/11/23 2101)                                    Medical Decision Making Amount and/or Complexity of Data Reviewed Labs: ordered. Radiology: ordered.  Risk Prescription drug management.   Patient reported seizure.  History of same.  Reportedly has had some alcohol use.  Reportedly may have some other substance use.  Reviewed previous neurology note.  Will monitor for now.  Mental status is improving.  Mental status had improved.  Patient had eaten, however did have likely recurrent seizure.  Had tachycardia on the monitor.  Diaphoretic and confused after.   Discussed with Dr. Vanessa from  neurology.  Recommended loading on Keppra .  Although patient has alcohol issues does have a lesion from previous stroke in the temporal area.  Has had previous partial seizures.  I think with recurrent seizure patient benefit from mission to the hospital.  [facility] stay [facility].  Will discuss with hospitalist for admission.   CRITICAL CARE Performed by: Lot Patsey Total critical care time: 30 minutes Critical care time was exclusive of separately billable procedures and treating other patients. Critical care was necessary to treat or prevent imminent or life-threatening deterioration. Critical care was time spent personally by me on the following activities: development of treatment plan with patient and/or surrogate as well as nursing, discussions with consultants, evaluation of patient's response to treatment, examination of patient, obtaining history from patient or surrogate, ordering and performing treatments and interventions, ordering and review of laboratory studies, ordering and review of  radiographic studies, pulse oximetry and re-evaluation of patient's condition.      Final diagnoses:  Seizure [facility])    ED Discharge Orders     None          Patsey Lot, MD 10/11/23 2147

## 2023-10-12 ENCOUNTER — Encounter (HOSPITAL_COMMUNITY): Payer: Self-pay | Admitting: Internal Medicine

## 2023-10-12 ENCOUNTER — Inpatient Hospital Stay (HOSPITAL_COMMUNITY)

## 2023-10-12 ENCOUNTER — Other Ambulatory Visit (HOSPITAL_COMMUNITY)

## 2023-10-12 ENCOUNTER — Inpatient Hospital Stay (HOSPITAL_COMMUNITY)
Admit: 2023-10-12 | Discharge: 2023-10-12 | Disposition: A | Discharge status: Home / Self Care | Attending: Internal Medicine | Admitting: Internal Medicine

## 2023-10-12 DIAGNOSIS — F10930 Alcohol use, unspecified with withdrawal, uncomplicated: Secondary | ICD-10-CM | POA: Diagnosis not present

## 2023-10-12 DIAGNOSIS — Z8673 Personal history of transient ischemic attack (TIA), and cerebral infarction without residual deficits: Secondary | ICD-10-CM | POA: Diagnosis not present

## 2023-10-12 DIAGNOSIS — R569 Unspecified convulsions: Secondary | ICD-10-CM

## 2023-10-12 DIAGNOSIS — Z89511 Acquired absence of right leg below knee: Secondary | ICD-10-CM

## 2023-10-12 DIAGNOSIS — I10 Essential (primary) hypertension: Secondary | ICD-10-CM | POA: Diagnosis not present

## 2023-10-12 LAB — CBC
HCT: 37.6 % — ABNORMAL LOW (ref 39.0–52.0)
Hemoglobin: 12.2 g/dL — ABNORMAL LOW (ref 13.0–17.0)
MCH: 28.6 pg (ref 26.0–34.0)
MCHC: 32.4 g/dL (ref 30.0–36.0)
MCV: 88.3 fL (ref 80.0–100.0)
Platelets: 147 K/uL — ABNORMAL LOW (ref 150–400)
RBC: 4.26 MIL/uL (ref 4.22–5.81)
RDW: 18.6 % — ABNORMAL HIGH (ref 11.5–15.5)
WBC: 7.7 K/uL (ref 4.0–10.5)
nRBC: 0 % (ref 0.0–0.2)

## 2023-10-12 LAB — COMPREHENSIVE METABOLIC PANEL WITH GFR
ALT: 24 U/L (ref 0–44)
AST: 38 U/L (ref 15–41)
Albumin: 3 g/dL — ABNORMAL LOW (ref 3.5–5.0)
Alkaline Phosphatase: 75 U/L (ref 38–126)
Anion gap: 11 (ref 5–15)
BUN: 6 mg/dL — ABNORMAL LOW (ref 8–23)
CO2: 23 mmol/L (ref 22–32)
Calcium: 8.3 mg/dL — ABNORMAL LOW (ref 8.9–10.3)
Chloride: 99 mmol/L (ref 98–111)
Creatinine, Ser: 0.75 mg/dL (ref 0.61–1.24)
GFR, Estimated: >60 mL/min (ref >60)
Glucose, Bld: 91 mg/dL (ref 70–99)
Potassium: 3.5 mmol/L (ref 3.5–5.1)
Sodium: 133 mmol/L — ABNORMAL LOW (ref 135–145)
Total Bilirubin: 1.3 mg/dL — ABNORMAL HIGH (ref 0.0–1.2)
Total Protein: 7.4 g/dL (ref 6.5–8.1)

## 2023-10-12 LAB — RAPID URINE DRUG SCREEN, HOSP PERFORMED
Amphetamines: NOT DETECTED
Barbiturates: NOT DETECTED
Benzodiazepines: NOT DETECTED
Cocaine: NOT DETECTED
Opiates: NOT DETECTED
Tetrahydrocannabinol: NOT DETECTED

## 2023-10-12 LAB — HIV ANTIBODY (ROUTINE TESTING W REFLEX): HIV Screen 4th Generation wRfx: NONREACTIVE

## 2023-10-12 MED ORDER — LEVETIRACETAM 500 MG PO TABS
500.0000 mg | ORAL_TABLET | Freq: Two times a day (BID) | ORAL | Status: DC
  Administered 2023-10-12 – 2023-10-19 (×14): 500 mg via ORAL
  Filled 2023-10-12 (×14): qty 1

## 2023-10-12 NOTE — Progress Notes (Signed)
 EEG complete - results pending

## 2023-10-12 NOTE — Assessment & Plan Note (Signed)
 10-12-2023 EEG negative for seizure. But had cortical dysfunction in right temporal region likely secondary to underlying stroke. Change to po keppra . Pt advised not to consume etoh ever again.  10-13-2023 tolerating PO keppra . Will DC to SNF on keppra  500 mg bid.

## 2023-10-12 NOTE — Assessment & Plan Note (Signed)
 10-12-2023 chronic. Pt states he has his prosthetic leg at home.  10-13-2023 has his prosthetic leg now in his room. Pt is debilitated. Needs SNF. TOC consulted. Niece is aware and agreeable.

## 2023-10-12 NOTE — ED Notes (Signed)
 Safety zone has been filled out regarding pt's fall.

## 2023-10-12 NOTE — TOC Initial Note (Signed)
 Transition of Care [facility]) - Initial/Assessment Note   Patient Details  Name: Dominic Carter MRN: [id number] Date of Birth: 1955/10/06  Transition of Care [facility]) CM/SW Contact:    Duwaine GORMAN Aran, LCSW Phone Number: 10/12/2023, 10:13 AM  Clinical Narrative: [facility] consulted for ETOH use resources. Patient remains disoriented. TOC to follow up with patient once orientation improves.  Expected Discharge Plan: Home/Self Care Barriers to Discharge: Continued Medical Work up  Patient Goals and CMS Choice Patient states their goals for this hospitalization and ongoing recovery are:: Patient currently disoriented Choice offered to / list presented to : NA  Expected Discharge Plan and Services In-house Referral: Clinical Social Work Post Acute Care Choice: NA Living arrangements for the past 2 months: Single Family Home           DME Arranged: N/A DME Agency: NA  Prior Living Arrangements/Services Living arrangements for the past 2 months: Single Family Home Patient language and need for interpreter reviewed:: Yes Do you feel safe going back to the place where you live?: Yes      Need for Family Participation in Patient Care: Yes (Comment) Care giver support system in place?: Yes (comment) Criminal Activity/Legal Involvement Pertinent to Current Situation/Hospitalization: No - Comment as needed  Activities of Daily Living ADL Screening (condition at time of admission) Independently performs ADLs?: Yes (appropriate for developmental age) Is the patient deaf or have difficulty hearing?: No Does the patient have difficulty seeing, even when wearing glasses/contacts?: No Does the patient have difficulty concentrating, remembering, or making decisions?: No  Emotional Assessment Alcohol / Substance Use: Alcohol Use Psych Involvement: No (comment)  Admission diagnosis:  Seizure (HCC) [R56.9] Seizure due to alcohol withdrawal (HCC) [Q89.060, R56.9] Patient Active Problem List   Diagnosis Date  Noted   Seizure due to alcohol withdrawal (HCC) 10/11/2023   Altered mental status-secondary to seizure and alcohol withdrawal 10/11/2023   Groin abscess 08/03/2023   Right inguinal abscess 01/06/2022   Renal abscess, left 01/06/2022   Transaminitis 09/01/2021   Tobacco abuse 09/01/2021   History of CVA (cerebrovascular accident) 09/01/2021   Scalp wound 09/01/2021   Alcohol withdrawal (HCC)    Hyperlipidemia 01/30/2020   Hepatitis, alcoholic, acute 01/30/2020   Seizure (HCC) 01/20/2020   ETOH abuse 01/20/2020   Essential hypertension 01/20/2020   Chronic ischemic right MCA stroke 01/20/2020   PCP:  Leron Millman, NP Pharmacy:   [facility] - [place], [place] - [address] [place] [address] Phone: [phone] Fax: [phone]  [facility] [address] [place] [address] Phone: [phone] Fax: [phone]  Social Drivers of Health (SDOH) Social History: SDOH Screenings   Food Insecurity: No Food Insecurity (10/12/2023)  Housing: Low Risk  (10/12/2023)  Transportation Needs: No Transportation Needs (10/12/2023)  Utilities: Not At Risk (10/12/2023)  Financial Resource Strain: Medium Risk (04/04/2022)   Received from [facility]  Physical Activity: Unknown (04/04/2022)   Received from [facility]  Social Connections: Socially Isolated (10/12/2023)  Stress: No Stress Concern Present (04/04/2022)   Received from [facility]  Tobacco Use: High Risk (10/11/2023)   SDOH Interventions:    Readmission Risk Interventions     No data to display

## 2023-10-12 NOTE — Hospital Course (Signed)
 HPI: Dominic Carter is a 68 y.o. 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 Family reported patient is using alcohol and concern for other substance use as well.  Last drink of alcohol yesterday. Per chart neurology from [facility] impression was patient has alcohol withdrawal related seizure.   Today patient has total 2 episodes of seizure 1 at home and another episode while in the emergency department. In the ED neurology has been consulted recommended to load with Keppra  1500 mg IV and start Keppra  500 mg twice daily and continue outpatient as well.  Recommended observe for several hours for ensure no seizure activity.  Per neurology if patient develops more seizure or does not return to his baseline then neurology needs to be involved.  Neurology did not recommended transfer patient to [facility] neither any recommendation for EEG.  Per neurology it is unclear if patient have seizure secondary to previous CVA versus alcohol withdrawal.     During my evaluation at the bedside patient is alert and oriented.  Patient stated that he had a seizure at home which brought him to the ED for evaluation-able to recall the event.  During my evaluation at the bedside patient suddenly started vomiting which is greenish in color.  Patient denies any abdominal pain. Patient's RN stated in the ED patient suddenly became tachycardic heart rate up to 160 and found postictal however no one actually witnessed the seizure in the ED. Patient reported drinking alcohol however unable to quantify how much and what he has been drinking.  Unable to disclose any drug use history.  Significant Events: Admitted 10/11/2023 for alcohol withdrawal seizure   Admission Labs: WBC 6.5, HgB 11.1, pt 156 Na  138, K 3.8, CO2 of 23, BUN 9, Scr 0.7, glu 103 T prot 7.6, alb 3.2, AST 41, ALT 25, alk phos 83. T. Bili 1.0 ETOH <15 UDS negative  Admission Imaging Studies: CXR No active disease.  CT head Atrophy, chronic microvascular disease.  No acute intracranial abnormality. Stable right temporoparietal encephalomalacia.  Significant Labs:   Significant Imaging Studies:   Antibiotic Therapy: Anti-infectives (From admission, onward)    None       Procedures:   Consultants:

## 2023-10-12 NOTE — Assessment & Plan Note (Signed)
 10-12-2023 on ASA  10-13-2023 stable on ASA.

## 2023-10-12 NOTE — Progress Notes (Signed)
   10/12/23 0318  What Happened  Was fall witnessed? No  Was patient injured? No  Patient found on floor  Found by Staff-comment  Stated prior activity bathroom-unassisted  Provider Notification  Provider Name/Title Lynwood Kipper, NP  Date Provider Notified 10/12/23  Time Provider Notified [phone]  Method of Notification Page (secure chat)  Notification Reason Fall  Provider response See new orders  Date of Provider Response 10/12/23  Time of Provider Response 0328  Follow Up  Family notified  (Called the patient niece several times but no answer. Unable to leave a message due to full voice mail)  Time family notified [phone]  Additional tests Yes-comment (CT scan)  Progress note created (see row info) Yes  Adult Fall Risk Assessment  Risk Factor Category (scoring not indicated) Fall has occurred during this admission (document High fall risk);History of more than one fall within 6 months before admission (document High fall risk)  Patient Fall Risk Level High fall risk  Adult Fall Risk Interventions  Required Bundle Interventions *See Row Information* High fall risk - low, moderate, and high requirements implemented  Screening for Fall Injury Risk (To be completed on HIGH fall risk patients) - Assessing Need for Floor Mats  Risk For Fall Injury- Criteria for Floor Mats Noncompliant with safety precautions  Will Implement Floor Mats Yes  Vitals  Temp 98.5 F (36.9 C)  Temp Source Oral  BP (!) 140/77  MAP (mmHg) 97  BP Location Left Arm  BP Method Automatic  Patient Position (if appropriate) Lying  Pulse Rate 88  Pulse Rate Source Monitor  Resp 19  Oxygen Therapy  SpO2 100 %  O2 Device Room Air  Pain Assessment  Pain Scale 0-10  Pain Score 0  PCA/Epidural/Spinal Assessment  Respiratory Pattern Regular  Neurological  Neuro (WDL) X  Level of Consciousness Alert  Orientation Level Oriented to person;Oriented to place;Disoriented to time;Disoriented to situation   Cognition Poor safety awareness  Speech Delayed responses  Neuro Symptoms Forgetful;Anxiety  Neuro symptoms relieved by Anti-anxiety medication  Glasgow Coma Scale  Eye Opening 4  Best Verbal Response (NON-intubated) 4  Best Motor Response 6  Glasgow Coma Scale Score 14  Musculoskeletal  Musculoskeletal (WDL) X  Assistive Device Front wheel walker  Generalized Weakness Yes  Weight Bearing Restrictions Per Provider Order No  Musculoskeletal Details  Right Knee Below knee amputation  Integumentary  Integumentary (WDL) X  Nurse assisting at admit/transfer - Full skin assessment Inok, RN  Skin Color Appropriate for ethnicity  Skin Condition Dry;Flaky  Skin Integrity Other (Comment) (see LDA, no new skin issues)  Skin Turgor Non-tenting

## 2023-10-12 NOTE — Progress Notes (Signed)
 PROGRESS NOTE    Dominic Carter  FMW:[id number] DOB: [birth year] DOA: 10/11/2023 PCP: Leron Millman, NP  Subjective: Pt seen and examined. Pt lives with his niece. States he only drink 40 ounces of beer. Denies any liquor intake.    Hospital Course: HPI: Dominic Carter is a 68 y.o. 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 Family reported patient is using alcohol and concern for other substance use as well.  Last drink of alcohol yesterday. Per chart neurology from [facility] impression was patient has alcohol withdrawal related seizure.   Today patient has total 2 episodes of seizure 1 at home and another episode while in the emergency department. In the ED neurology has been consulted recommended to load with Keppra  1500 mg IV and start Keppra  500 mg twice daily and continue outpatient as well.  Recommended observe for several hours for ensure no seizure activity.  Per neurology if patient develops more seizure or does not return to his baseline then neurology needs to be involved.  Neurology did not recommended transfer patient to [facility] neither any recommendation for EEG.  Per neurology it is unclear if patient have seizure secondary to previous CVA versus alcohol withdrawal.     During my evaluation at the bedside patient is alert and oriented.  Patient stated that he had a seizure at home which brought him to the ED for evaluation-able to recall the event.  During my evaluation at the bedside patient suddenly started vomiting which is greenish in color.  Patient denies any abdominal pain. Patient's RN stated in the ED patient suddenly became tachycardic heart rate up to 160 and found postictal however no one actually witnessed the seizure in the ED. Patient  reported drinking alcohol however unable to quantify how much and what he has been drinking.  Unable to disclose any drug use history.  Significant Events: Admitted 10/11/2023 for alcohol withdrawal seizure   Admission Labs: WBC 6.5, HgB 11.1, pt 156 Na 138, K 3.8, CO2 of 23, BUN 9, Scr 0.7, glu 103 T prot 7.6, alb 3.2, AST 41, ALT 25, alk phos 83. T. Bili 1.0 ETOH <15 UDS negative  Admission Imaging Studies: CXR No active disease.  CT head Atrophy, chronic microvascular disease.  No acute intracranial abnormality. Stable right temporoparietal encephalomalacia.  Significant Labs:   Significant Imaging Studies:   Antibiotic Therapy: Anti-infectives (From admission, onward)    None       Procedures:   Consultants:     Assessment and Plan: * Seizure due to alcohol withdrawal (HCC) 10-12-2023 EEG negative for seizure. But had cortical dysfunction in right temporal region likely secondary to underlying stroke. Change to po keppra . Pt advised not to consume etoh ever again.  Altered mental status-secondary to seizure and alcohol withdrawal 10-12-2023 EEG negative for seizure. But had cortical dysfunction in right temporal region likely secondary to underlying stroke. Change to po keppra . Pt advised not to consume etoh ever again. AMS now resolved.  Alcohol withdrawal (HCC) 10-12-2023 continue with CIWA. Now on keppra .  Seizure (HCC) 10-12-2023 EEG negative for seizure. But had cortical dysfunction in right temporal region likely secondary to underlying stroke. Change to po keppra . Pt advised not to consume etoh ever again.  History of CVA (cerebrovascular accident) 10-12-2023 on ASA  Hyperlipidemia 10-12-2023 stable.  On pravachol   Essential hypertension 10-12-2023 on cozaar .   DVT prophylaxis: enoxaparin  (LOVENOX ) injection 40 mg Start: 10/12/23 1000 SCDs Start: 10/11/23 2223 Place TED hose Start: 10/11/23 2223     Code Status: Full Code Family  Communication: no family at bedside Disposition Plan: return home Reason for continuing need for hospitalization: DC to home tomorrow. Change to po keppra .  Objective: Vitals:   10/12/23 0318 10/12/23 0406 10/12/23 0921 10/12/23 1210  BP: (!) 140/77 (!) 142/85 122/75 131/85  Pulse: 88 69 80 79  Resp: 19  18 18   Temp: 98.5 F (36.9 C)  98.4 F (36.9 C) 97.9 F (36.6 C)  TempSrc: Oral  Oral Oral  SpO2: 100%  99% 97%  Weight:      Height:        Intake/Output Summary (Last 24 hours) at 10/12/2023 1627 Last data filed at 10/12/2023 1335 Gross per 24 hour  Intake 1730.12 ml  Output --  Net 1730.12 ml   Filed Weights   10/11/23 1534 10/12/23 0211  Weight: 65.8 kg 59.8 kg    Examination:  Physical Exam Vitals and nursing note reviewed.  Constitutional:      Comments: Appears chronically ill  HENT:     Head: Normocephalic and atraumatic.     Nose: Nose normal.  Cardiovascular:     Rate and Rhythm: Normal rate and regular rhythm.  Pulmonary:     Effort: Pulmonary effort is normal.     Breath sounds: Normal breath sounds.  Abdominal:     General: Abdomen is flat. Bowel sounds are normal.     Palpations: Abdomen is soft.  Skin:    Capillary Refill: Capillary refill takes less than 2 seconds.  Neurological:     Mental Status: He is alert and oriented to person, place, and time.     Data Reviewed: I have personally reviewed following labs and imaging studies  CBC: Recent Labs  Lab 10/11/23 1603 10/12/23 1349  WBC 6.5 7.7  HGB 11.1* 12.2*  HCT 34.5* 37.6*  MCV 88.9 88.3  PLT 156 147*   Basic Metabolic Panel: Recent Labs  Lab 10/11/23 1603 10/12/23 1349  NA 138 133*  K 3.8 3.5  CL 104 99  CO2 23 23  GLUCOSE 103* 91  BUN 9 6*  CREATININE 0.70 0.75  CALCIUM  8.3* 8.3*   GFR: Estimated Creatinine Clearance: 74.8 mL/min (by C-G formula based on SCr of 0.75 mg/dL). Liver Function Tests: Recent Labs  Lab 10/11/23 1603 10/12/23 1349  AST 41 38  ALT 26  24  ALKPHOS 83 75  BILITOT 1.0 1.3*  PROT 7.6 7.4  ALBUMIN 3.2* 3.0*   CBG: Recent Labs  Lab 10/11/23 2049  GLUCAP 125*   Radiology Studies: EEG adult Result Date: 10/12/2023 Shelton Arlin KIDD, MD     10/12/2023 12:40 PM Patient Name: Dominic Carter MRN: [id number] Epilepsy Attending: Arlin KIDD Shelton Referring Physician/Provider: Sundil, Subrina, MD Date: 10/12/2023 Duration: 24.31 mins Patient history:  68 y.o. male with PMH significant for previous right posterior MCA cortical infarct (apparently unknown to patient), EtOh use who presents with two seizures. EEG to evaluate for seizure Level of alertness: Awake,  asleep AEDs during EEG study: None Technical aspects: This EEG study was done with scalp electrodes positioned according to the 10-20 International system of electrode placement. Electrical activity was reviewed with band pass filter of 1-70Hz , sensitivity of 7 uV/mm, display speed of 88mm/sec with a 60Hz  notched filter applied as appropriate. EEG data were  recorded continuously and digitally stored.  Video monitoring was available and reviewed as appropriate. Description: The posterior dominant rhythm consists of 8-9 Hz activity of moderate voltage (25-35 uV) seen predominantly in posterior head regions, symmetric and reactive to eye opening and eye closing. Sleep was characterized by vertex waves, sleep spindles (12 to 14 Hz), maximal frontocentral region. EEG showed intermittent generalized  3 to 6 Hz theta-delta slowing in right temporal region. Hyperventilation and photic stimulation were not performed.    ABNORMALITY - Intermittent slow, right temporal region  IMPRESSION: This study is suggestive of cortical dysfunction in right temporal region likely secondary to underlying stroke.  No seizures or epileptiform discharges were seen throughout the recording.  Priyanka MALVA Krebs    CT HEAD WO CONTRAST ( ) Result Date: 10/12/2023 EXAM: CT HEAD WITHOUT CONTRAST 10/12/2023 05:46:35 AM  TECHNIQUE: CT of the head was performed without the administration of intravenous contrast. Automated exposure control, iterative reconstruction, and/or weight based adjustment of the mA/kV was utilized to reduce the radiation dose to as low as reasonably achievable. COMPARISON: None available. CLINICAL HISTORY: Fall - unwitnessed. FINDINGS: BRAIN AND VENTRICLES: No acute intracranial abnormalities present. Chronic right parietal occipital encephalomalacia is stable. Ex vacuo dilation of the right lateral ventricle is noted. Moderate white matter changes are stable. A remote small lacunar infarct is present in the left PICA (posterior inferior cerebellar artery) territory. ORBITS: No acute abnormality. SINUSES: A fluid level was present within the left sphenoid sinus and a posterior left ethmoid air cell. Minimal fluid is present in the right sphenoid sinus. The paranasal sinuses are otherwise clear. SOFT TISSUES AND SKULL: No acute soft tissue abnormality. No skull fracture. IMPRESSION: 1. No acute intracranial abnormality related to the unwitnessed fall. 2. Stable chronic right parietal occipital encephalomalacia with ex vacuo dilation of the right lateral ventricle and moderate white matter changes. 3. Rule out small lacunar infarct in the left PICA territory. 4. Fluid level in the left sphenoid sinus and posterior left ethmoid air cell, with minimal fluid in the right sphenoid sinus. Electronically signed by: Lonni Necessary MD 10/12/2023 06:29 AM EDT RP Workstation: [id number]   CT HEAD WO CONTRAST ( ) Result Date: 10/11/2023 CLINICAL DATA:  Seizure disorder, clinical change EXAM: CT HEAD WITHOUT CONTRAST TECHNIQUE: Contiguous axial images were obtained from the base of the skull through the vertex without intravenous contrast. RADIATION DOSE REDUCTION: This exam was performed according to the departmental dose-optimization program which includes automated exposure control, adjustment of the mA and/or  kV according to patient size and/or use of iterative reconstruction technique. COMPARISON:  07/13/2023 FINDINGS: Brain: Area of encephalomalacia within the posterior right temporal and parietal lobes again noted, unchanged. There is atrophy and chronic small vessel disease changes. No acute intracranial abnormality. Specifically, no hemorrhage, hydrocephalus, mass lesion, acute infarction, or significant intracranial injury. Vascular: No hyperdense vessel or unexpected calcification. Skull: No acute calvarial abnormality. Sinuses/Orbits: No acute findings Other: None IMPRESSION: Atrophy, chronic microvascular disease. No acute intracranial abnormality. Stable right temporoparietal encephalomalacia. Electronically Signed   By: Franky Crease M.D.   On: 10/11/2023 21:25   DG Chest Portable 1 View Result Date: 10/11/2023 CLINICAL DATA:  ams EXAM: PORTABLE CHEST 1 VIEW COMPARISON:  None Available. FINDINGS: The heart and mediastinal contours are within normal limits. No focal consolidation. No pulmonary edema. No pleural effusion. No pneumothorax. No acute osseous abnormality. IMPRESSION: No active disease. Electronically Signed   By: Morgane  Naveau M.D.   On: 10/11/2023 21:12    Scheduled Meds:  aspirin  EC  81 mg Oral Daily   enoxaparin  (LOVENOX ) injection  40 mg Subcutaneous Q24H   folic acid   1 mg Oral Daily   levETIRAcetam   500 mg Oral BID   losartan   25 mg Oral Daily   multivitamin with minerals  1 tablet Oral Daily   pravastatin   20 mg Oral Daily   sodium chloride  flush  3 mL Intravenous Q12H   sodium chloride  flush  3 mL Intravenous Q12H   thiamine   100 mg Oral Daily   Or   thiamine   100 mg Intravenous Daily   Continuous Infusions:  sodium chloride      sodium chloride        LOS: 1 day   Time spent: 55 minutes  Camellia Door, DO  [facility]  10/12/2023, 4:27 PM

## 2023-10-12 NOTE — Progress Notes (Signed)
       Overnight   NAME: Dominic Carter MRN: [id number] DOB : Apr 02, 1956    Date of Service   10/12/2023   HPI/Events of Note    Notified by RN for potential fall.(Unwitnessed) Patient is confused as at admission.   Pt stated to RN that he was trying to go to the bathroom. Rails were stated as up on the bed as allowed for floor protocol and call bell stated as in reach.   No deformities, contusions, abrasions, punctures, bruising, tears lacerations, or swelling noted.  Patient appears as was noted at admission.   Interventions/ Plan   Fall arresting pads  Consider tele-sitter. Bed alarm active.  CIWA scale in place.      Lynwood Kipper BSN MSNA MSN ACNPC-AG Acute Care Nurse Practitioner [facility]

## 2023-10-12 NOTE — Progress Notes (Signed)
 Patient was found sitting on the floor, RN didn't witness the patient fall.Patient stated he was trying to go to the bathroom to have a BM. Stool spread all over the floor. Bed rails was up and call bell within reach during the event. Advised patient several times to call if he needs help. No injuries noted. Vital signs stable.On call provider noted. Will continue to monitor patient.

## 2023-10-12 NOTE — Assessment & Plan Note (Signed)
 10-12-2023 continue with CIWA. Now on keppra .  10-13-2023 tolerating PO keppra . Stop CIWA.

## 2023-10-12 NOTE — Subjective & Objective (Signed)
 Pt seen and examined. Spoke at length with pt's niece [name] via phone. She states pt is drinking heavily now. Pt had been on AEDs but was taken off due to seizure free period of years. Pt with hx of prior strokes. Niece says that pt cannot go back and live with her until he stop drinking ETOH and can walk.  Pt seen by PT and needs SNF for rehab.

## 2023-10-12 NOTE — Assessment & Plan Note (Signed)
 10-12-2023 EEG negative for seizure. But had cortical dysfunction in right temporal region likely secondary to underlying stroke. Change to po keppra . Pt advised not to consume etoh ever again. AMS now resolved.  10-13-2023 resolved.

## 2023-10-12 NOTE — Assessment & Plan Note (Signed)
 10-12-2023 on cozaar .  10-13-2023 stable on cozaar 

## 2023-10-12 NOTE — Assessment & Plan Note (Signed)
 10-12-2023 stable. On pravachol   10-13-2023 stable.

## 2023-10-12 NOTE — Progress Notes (Deleted)
 Safety zone has been filled out regarding patient's fall.

## 2023-10-12 NOTE — Procedures (Addendum)
 Patient Name: Dominic Carter  MRN: [id number]  Epilepsy Attending: Arlin MALVA Krebs  Referring Physician/Provider: Sundil, Subrina, MD  Date: 10/12/2023 Duration: 24.31 mins  Patient history:   68 y.o. male with PMH significant for previous right posterior MCA cortical infarct (apparently unknown to patient), EtOh use who presents with two seizures. EEG to evaluate for seizure  Level of alertness: Awake,  asleep  AEDs during EEG study: None  Technical aspects: This EEG study was done with scalp electrodes positioned according to the 10-20 International system of electrode placement. Electrical activity was reviewed with band pass filter of 1-70Hz , sensitivity of 7 uV/mm, display speed of 39mm/sec with a 60Hz  notched filter applied as appropriate. EEG data were recorded continuously and digitally stored.  Video monitoring was available and reviewed as appropriate.  Description: The posterior dominant rhythm consists of 8-9 Hz activity of moderate voltage (25-35 uV) seen predominantly in posterior head regions, symmetric and reactive to eye opening and eye closing. Sleep was characterized by vertex waves, sleep spindles (12 to 14 Hz), maximal frontocentral region. EEG showed intermittent generalized  3 to 6 Hz theta-delta slowing in right temporal region. Hyperventilation and photic stimulation were not performed.      ABNORMALITY - Intermittent slow, right temporal region   IMPRESSION: This study is suggestive of cortical dysfunction in right temporal region likely secondary to underlying stroke.  No seizures or epileptiform discharges were seen throughout the recording.   [name] [name]

## 2023-10-13 ENCOUNTER — Encounter (HOSPITAL_COMMUNITY): Payer: Self-pay | Admitting: Internal Medicine

## 2023-10-13 DIAGNOSIS — F10930 Alcohol use, unspecified with withdrawal, uncomplicated: Secondary | ICD-10-CM | POA: Diagnosis not present

## 2023-10-13 DIAGNOSIS — I10 Essential (primary) hypertension: Secondary | ICD-10-CM | POA: Diagnosis not present

## 2023-10-13 DIAGNOSIS — R569 Unspecified convulsions: Secondary | ICD-10-CM | POA: Diagnosis not present

## 2023-10-13 DIAGNOSIS — R4182 Altered mental status, unspecified: Secondary | ICD-10-CM | POA: Diagnosis not present

## 2023-10-13 MED ORDER — NICOTINE 7 MG/24HR TD PT24
7.0000 mg | MEDICATED_PATCH | Freq: Once | TRANSDERMAL | Status: AC
  Administered 2023-10-13: 7 mg via TRANSDERMAL
  Filled 2023-10-13: qty 1

## 2023-10-13 MED ORDER — LORAZEPAM 1 MG PO TABS
1.0000 mg | ORAL_TABLET | ORAL | Status: AC | PRN
  Administered 2023-10-14: 1 mg via ORAL
  Administered 2023-10-15: 2 mg via ORAL
  Filled 2023-10-13: qty 1
  Filled 2023-10-13: qty 2

## 2023-10-13 MED ORDER — LORAZEPAM 2 MG/ML IJ SOLN
1.0000 mg | INTRAMUSCULAR | Status: AC | PRN
  Administered 2023-10-13: 2 mg via INTRAVENOUS
  Administered 2023-10-14: 4 mg via INTRAVENOUS
  Administered 2023-10-14: 2 mg via INTRAVENOUS
  Administered 2023-10-14: 4 mg via INTRAVENOUS
  Administered 2023-10-15: 2 mg via INTRAVENOUS
  Administered 2023-10-15: 4 mg via INTRAVENOUS
  Administered 2023-10-15: 2 mg via INTRAVENOUS
  Filled 2023-10-13: qty 2
  Filled 2023-10-13: qty 1
  Filled 2023-10-13: qty 2

## 2023-10-13 NOTE — TOC Progression Note (Signed)
 Transition of Care [facility]) - Progression Note   Patient Details  Name: Dominic Carter MRN: [id number] Date of Birth: [birth year]  Transition of Care [facility]) CM/SW Contact  Duwaine GORMAN Aran, LCSW Phone Number: 10/13/2023, 12:24 PM  Clinical Narrative: PT evaluation recommended SNF. Patient remains oriented to self only. CSW spoke with niece regarding recommendation and niece is agreeable to rehab. FL2 done; PASRR confirmed. Initial referral faxed out. TOC awaiting bed offers.  Expected Discharge Plan: Skilled Nursing Facility Barriers to Discharge: SNF Pending bed offer, Insurance Authorization  Expected Discharge Plan and Services In-house Referral: Clinical Social Work Post Acute Care Choice: Skilled Nursing Facility Living arrangements for the past 2 months: Single Family Home           DME Arranged: N/A DME Agency: NA  Social Determinants of Health (SDOH) Interventions SDOH Screenings   Food Insecurity: No Food Insecurity (10/12/2023)  Housing: Low Risk  (10/12/2023)  Transportation Needs: No Transportation Needs (10/12/2023)  Utilities: Not At Risk (10/12/2023)  Financial Resource Strain: Medium Risk (04/04/2022)   Received from [facility]  Physical Activity: Unknown (04/04/2022)   Received from [facility]  Social Connections: Socially Isolated (10/12/2023)  Stress: No Stress Concern Present (04/04/2022)   Received from [facility]  Tobacco Use: High Risk (10/11/2023)   Readmission Risk Interventions     No data to display

## 2023-10-13 NOTE — Evaluation (Signed)
 Physical Therapy Evaluation Patient Details Name: Dominic Carter MRN: [id number] DOB: 24-Jul-1955 Today's Date: 10/13/2023  History of Present Illness  Dominic Carter is a 68 y.o. male brought for evaluation for seizure and altered mental status. 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 Pt reports from home with niece, but unable to state her name or home setup. Pt reports has cane, RW and w/c, reports using RW and w/c but doesn't state primary locomotion source. On eval, pt unable to don R LE prosthesis after ~5 minutes of trying, LOB in sitting needing assist from therapist to prevent fall. Therapist dons prosthesis, then pt needing mod A to power up, BLE braced against bed, strong posterior lean, with attempted marching pt with posterior lean requiring assist to return to sitting on bed. Completed 4 STS reps from EOB, all needing mod A, strong posterior lean with BLE bracing, max verbal cues for hand placement and safety, unable to safely take steps away from EOB. Assisted pt back to supine with all needs in reach. Pt requests therapist call sister but unable to recall number, pt also unable to recall niece's name or number. Patient will benefit from continued inpatient follow up therapy, <3 hours/day. Pt currently with functional limitations due to the deficits listed below (see PT Problem List). Pt will benefit from acute skilled PT to increase their independence and safety with mobility to allow discharge.           If plan is discharge home, recommend the following: A lot of help with walking and/or transfers;A little help with bathing/dressing/bathroom;Assistance with cooking/housework;Assist for transportation;Help with stairs or ramp for entrance   Can travel by private vehicle   No    Equipment Recommendations None recommended by PT  Recommendations for  Other Services  OT consult    Functional Status Assessment Patient has had a recent decline in their functional status and demonstrates the ability to make significant improvements in function in a reasonable and predictable amount of time.     Precautions / Restrictions Precautions Precautions: Fall Precaution/Restrictions Comments: seizure Restrictions Weight Bearing Restrictions Per Provider Order: No      Mobility  Bed Mobility Overal bed mobility: Needs Assistance Bed Mobility: Supine to Sit, Sit to Supine     Supine to sit: Min assist, HOB elevated, Used rails Sit to supine: Min assist   General bed mobility comments: min A with HOB elevated and pt using bedrails, assistance at trunk to upright; min A to lift BLE back into bed and scoot up to comfort    Transfers Overall transfer level: Needs assistance Equipment used: Rolling walker (2 wheels) Transfers: Sit to/from Stand Sit to Stand: Mod assist           General transfer comment: total A to don prosthesis appropriately after pt attempts for ~5 minutes without success, LOB in sitting while attempting to don prosthesis in sitting; mod A to power up to standing with BLE braced against bed, slow to rise, max cues for hand placement and safety, attempted marching in place but pt with slow posterior lean needing assist to return to sitting on bed    Ambulation/Gait                  Stairs            Wheelchair Mobility     Tilt Bed  Modified Rankin (Stroke Patients Only)       Balance Overall balance assessment: Needs assistance, History of Falls Sitting-balance support: Feet supported, Bilateral upper extremity supported Sitting balance-Leahy Scale: Poor     Standing balance support: Reliant on assistive device for balance, During functional activity, Bilateral upper extremity supported Standing balance-Leahy Scale: Poor Standing balance comment: mod A with RW                              Pertinent Vitals/Pain Pain Assessment Pain Assessment: No/denies pain    Home Living Family/patient expects to be discharged to:: Private residence Living Arrangements: Other relatives (niece)               Home Equipment: Agricultural consultant (2 wheels);Cane - single point;Wheelchair - manual Additional Comments: R prosthesis    Prior Function Prior Level of Function : Independent/Modified Independent;Patient poor historian/Family not available             Mobility Comments: pt reports using RW or w/c for mobility, unable to determine which is primary mode of locomotion ADLs Comments: pt reports ind with self care, niece compeltes household chores     Extremity/Trunk Assessment   Upper Extremity Assessment Upper Extremity Assessment: Defer to OT evaluation    Lower Extremity Assessment Lower Extremity Assessment: Generalized weakness;RLE deficits/detail RLE Deficits / Details: R BKA    Cervical / Trunk Assessment Cervical / Trunk Assessment: Kyphotic  Communication   Communication Communication: Impaired Factors Affecting Communication: Reduced clarity of speech    Cognition Arousal: Alert Behavior During Therapy: Flat affect   PT - Cognitive impairments: No family/caregiver present to determine baseline                       PT - Cognition Comments: pt able to state name and year appropriately, reports being on a ship and unaware he is in the hospital, easily reoriented to hospital and later recognizes NT in room. Pt requesting therapist call his sister Consuelo, unable to recall her phone number, reports he doesn't know his niece's name. Following commands: Impaired Following commands impaired: Follows one step commands inconsistently, Follows one step commands with increased time     Cueing Cueing Techniques: Verbal cues, Tactile cues, Gestural cues     General Comments      Exercises     Assessment/Plan    PT Assessment  Patient needs continued PT services  PT Problem List Decreased strength;Decreased activity tolerance;Decreased balance;Decreased mobility;Decreased cognition;Decreased knowledge of use of DME;Decreased safety awareness       PT Treatment Interventions DME instruction;Gait training;Stair training;Functional mobility training;Therapeutic activities;Therapeutic exercise;Balance training;Neuromuscular re-education;Patient/family education;Cognitive remediation;Wheelchair mobility training    PT Goals (Current goals can be found in the Care Plan section)  Acute Rehab PT Goals Patient Stated Goal: none stated PT Goal Formulation: With patient Time For Goal Achievement: 10/27/23 Potential to Achieve Goals: Good    Frequency Min 2X/week     Co-evaluation               AM-PAC PT 6 Clicks Mobility  Outcome Measure Help needed turning from your back to your side while in a flat bed without using bedrails?: A Little Help needed moving from lying on your back to sitting on the side of a flat bed without using bedrails?: A Little Help needed moving to and from a bed to a chair (including a wheelchair)?: A Lot Help needed standing  up from a chair using your arms (e.g., wheelchair or bedside chair)?: A Lot Help needed to walk in hospital room?: Total Help needed climbing 3-5 steps with a railing? : Total 6 Click Score: 12    End of Session Equipment Utilized During Treatment: Gait belt Activity Tolerance: Patient tolerated treatment well Patient left: in bed;with call bell/phone within reach;with bed alarm set Nurse Communication: Mobility status PT Visit Diagnosis: Unsteadiness on feet (R26.81);Muscle weakness (generalized) (M62.81);History of falling (Z91.81);Difficulty in walking, not elsewhere classified (R26.2)    Time: 0935-1000 PT Time Calculation (min) (ACUTE ONLY): 25 min   Charges:   PT Evaluation $PT Eval Moderate Complexity: 1 Mod PT Treatments $Therapeutic Activity:  8-22 mins PT General Charges $$ ACUTE PT VISIT: 1 Visit         Tori Habeeb Puertas PT, DPT 10/13/23, 10:11 AM

## 2023-10-13 NOTE — Progress Notes (Signed)
 PROGRESS NOTE    Dominic Carter  FMW:[id number] DOB: July 28, 1955 DOA: 10/11/2023 PCP: Leron Millman, NP  Subjective: Pt seen and examined. Spoke at length with pt's niece [name] via phone. She states pt is drinking heavily now. Pt had been on AEDs but was taken off due to seizure free period of years. Pt with hx of prior strokes. Niece says that pt cannot go back and live with her until he stop drinking ETOH and can walk.  Pt seen by PT and needs SNF for rehab.   Hospital Course: HPI: Dominic Carter is a 68 y.o. 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 Family reported patient is using alcohol and concern for other substance use as well.  Last drink of alcohol yesterday. Per chart neurology from [facility] impression was patient has alcohol withdrawal related seizure.   Today patient has total 2 episodes of seizure 1 at home and another episode while in the emergency department. In the ED neurology has been consulted recommended to load with Keppra  1500 mg IV and start Keppra  500 mg twice daily and continue outpatient as well.  Recommended observe for several hours for ensure no seizure activity.  Per neurology if patient develops more seizure or does not return to his baseline then neurology needs to be involved.  Neurology did not recommended transfer patient to [facility] neither any recommendation for EEG.  Per neurology it is unclear if patient have seizure secondary to previous CVA versus alcohol withdrawal.     During my evaluation at the bedside patient is alert and oriented.  Patient stated that he had a seizure at home which brought him to the ED for evaluation-able to recall the event.  During my evaluation at the bedside patient suddenly  started vomiting which is greenish in color.  Patient denies any abdominal pain. Patient's RN stated in the ED patient suddenly became tachycardic heart rate up to 160 and found postictal however no one actually witnessed the seizure in the ED. Patient reported drinking alcohol however unable to quantify how much and what he has been drinking.  Unable to disclose any drug use history.  Significant Events: Admitted 10/11/2023 for alcohol withdrawal seizure   Admission Labs: WBC 6.5, HgB 11.1, pt 156 Na 138, K 3.8, CO2 of 23, BUN 9, Scr 0.7, glu 103 T prot 7.6, alb 3.2, AST 41, ALT 25, alk phos 83. T. Bili 1.0 ETOH <15 UDS negative  Admission Imaging Studies: CXR No active disease.  CT head Atrophy, chronic microvascular disease.  No acute intracranial abnormality. Stable right temporoparietal encephalomalacia.  Significant Labs:   Significant Imaging Studies:   Antibiotic Therapy: Anti-infectives (From admission, onward)    None       Procedures:   Consultants:     Assessment and Plan: * Seizure due to alcohol withdrawal (HCC) 10-12-2023 EEG negative for seizure. But had cortical dysfunction in right temporal region likely secondary to underlying stroke. Change to po keppra . Pt advised not to consume etoh ever again.  10-13-2023 tolerating PO keppra . Can stop CIWA now.  Altered mental status-secondary to seizure and alcohol withdrawal 10-12-2023 EEG negative for seizure. But had cortical dysfunction in right temporal region likely secondary to underlying stroke. Change to po keppra . Pt advised not to consume etoh ever again. AMS  now resolved.  10-13-2023 resolved.  Alcohol withdrawal (HCC) 10-12-2023 continue with CIWA. Now on keppra .  10-13-2023 tolerating PO keppra . Stop CIWA.  Seizure (HCC) 10-12-2023 EEG negative for seizure. But had cortical dysfunction in right temporal region likely secondary to underlying stroke. Change to po keppra . Pt advised not  to consume etoh ever again.  10-13-2023 tolerating PO keppra . Will DC to SNF on keppra  500 mg bid.   History of CVA (cerebrovascular accident) 10-12-2023 on ASA  10-13-2023 stable on ASA.  Hyperlipidemia 10-12-2023 stable. On pravachol   10-13-2023 stable.  Essential hypertension 10-12-2023 on cozaar .  10-13-2023 stable on cozaar   Hx of BKA, right (HCC) 10-12-2023 chronic. Pt states he has his prosthetic leg at home.  10-13-2023 has his prosthetic leg now in his room. Pt is debilitated. Needs SNF. TOC consulted. Niece is aware and agreeable.      DVT prophylaxis: enoxaparin  (LOVENOX ) injection 40 mg Start: 10/12/23 1000 SCDs Start: 10/11/23 2223 Place TED hose Start: 10/11/23 2223    Code Status: Full Code Family Communication: spoke with pt's niece [name] at length via phone Disposition Plan: SNF Reason for continuing need for hospitalization: medically stable for DC to SNF  Objective: Vitals:   10/12/23 2058 10/13/23 0506 10/13/23 0920 10/13/23 1132  BP: 127/77 113/75 123/79 122/89  Pulse: 70 74 78 85  Resp: 17 16  17   Temp: 97.9 F (36.6 C) 97.7 F (36.5 C)  97.8 F (36.6 C)  TempSrc: Oral Oral  Oral  SpO2: 100% 100%  100%  Weight:      Height:        Intake/Output Summary (Last 24 hours) at 10/13/2023 1320 Last data filed at 10/13/2023 1251 Gross per 24 hour  Intake 1560.46 ml  Output 1625 ml  Net -64.54 ml   Filed Weights   10/11/23 1534 10/12/23 0211  Weight: 65.8 kg 59.8 kg    Examination:  Physical Exam Vitals and nursing note reviewed.  Constitutional:      General: He is not in acute distress.    Appearance: He is not toxic-appearing.     Comments: Chronically ill appearing  HENT:     Head: Normocephalic and atraumatic.  Cardiovascular:     Rate and Rhythm: Normal rate.  Pulmonary:     Effort: Pulmonary effort is normal.     Breath sounds: Normal breath sounds.  Abdominal:     General: Bowel sounds are normal.     Palpations:  Abdomen is soft.  Musculoskeletal:     Comments: Right BKA.   Skin:    General: Skin is warm and dry.     Capillary Refill: Capillary refill takes less than 2 seconds.  Neurological:     Comments: Awake and alert.     Data Reviewed: I have personally reviewed following labs and imaging studies  CBC: Recent Labs  Lab 10/11/23 1603 10/12/23 1349  WBC 6.5 7.7  HGB 11.1* 12.2*  HCT 34.5* 37.6*  MCV 88.9 88.3  PLT 156 147*   Basic Metabolic Panel: Recent Labs  Lab 10/11/23 1603 10/12/23 1349  NA 138 133*  K 3.8 3.5  CL 104 99  CO2 23 23  GLUCOSE 103* 91  BUN 9 6*  CREATININE 0.70 0.75  CALCIUM  8.3* 8.3*   GFR: Estimated Creatinine Clearance: 74.8 mL/min (by C-G formula based on SCr of 0.75 mg/dL). Liver Function Tests: Recent Labs  Lab 10/11/23 1603 10/12/23 1349  AST 41 38  ALT 26 24  ALKPHOS 83 75  BILITOT 1.0 1.3*  PROT 7.6 7.4  ALBUMIN 3.2* 3.0*   CBG: Recent Labs  Lab 10/11/23 2049  GLUCAP 125*   Radiology Studies: EEG adult Result Date: 10/12/2023 Shelton Arlin KIDD, MD     10/12/2023 12:40 PM Patient Name: MACHAI DESMITH MRN: [id number] Epilepsy Attending: Arlin KIDD Shelton Referring Physician/Provider: Sundil, Subrina, MD Date: 10/12/2023 Duration: 24.31 mins Patient history:  68 y.o. male with PMH significant for previous right posterior MCA cortical infarct (apparently unknown to patient), EtOh use who presents with two seizures. EEG to evaluate for seizure Level of alertness: Awake,  asleep AEDs during EEG study: None Technical aspects: This EEG study was done with scalp electrodes positioned according to the 10-20 International system of electrode placement. Electrical activity was reviewed with band pass filter of 1-70Hz , sensitivity of 7 uV/mm, display speed of 76mm/sec with a 60Hz  notched filter applied as appropriate. EEG data were recorded continuously and digitally stored.  Video monitoring was available and reviewed as appropriate. Description: The  posterior dominant rhythm consists of 8-9 Hz activity of moderate voltage (25-35 uV) seen predominantly in posterior head regions, symmetric and reactive to eye opening and eye closing. Sleep was characterized by vertex waves, sleep spindles (12 to 14 Hz), maximal frontocentral region. EEG showed intermittent generalized  3 to 6 Hz theta-delta slowing in right temporal region. Hyperventilation and photic stimulation were not performed.    ABNORMALITY - Intermittent slow, right temporal region  IMPRESSION: This study is suggestive of cortical dysfunction in right temporal region likely secondary to underlying stroke.  No seizures or epileptiform discharges were seen throughout the recording.  Priyanka KIDD Shelton    CT HEAD WO CONTRAST ( ) Result Date: 10/12/2023 EXAM: CT HEAD WITHOUT CONTRAST 10/12/2023 05:46:35 AM TECHNIQUE: CT of the head was performed without the administration of intravenous contrast. Automated exposure control, iterative reconstruction, and/or weight based adjustment of the mA/kV was utilized to reduce the radiation dose to as low as reasonably achievable. COMPARISON: None available. CLINICAL HISTORY: Fall - unwitnessed. FINDINGS: BRAIN AND VENTRICLES: No acute intracranial abnormalities present. Chronic right parietal occipital encephalomalacia is stable. Ex vacuo dilation of the right lateral ventricle is noted. Moderate white matter changes are stable. A remote small lacunar infarct is present in the left PICA (posterior inferior cerebellar artery) territory. ORBITS: No acute abnormality. SINUSES: A fluid level was present within the left sphenoid sinus and a posterior left ethmoid air cell. Minimal fluid is present in the right sphenoid sinus. The paranasal sinuses are otherwise clear. SOFT TISSUES AND SKULL: No acute soft tissue abnormality. No skull fracture. IMPRESSION: 1. No acute intracranial abnormality related to the unwitnessed fall. 2. Stable chronic right parietal occipital  encephalomalacia with ex vacuo dilation of the right lateral ventricle and moderate white matter changes. 3. Rule out small lacunar infarct in the left PICA territory. 4. Fluid level in the left sphenoid sinus and posterior left ethmoid air cell, with minimal fluid in the right sphenoid sinus. Electronically signed by: Lonni Necessary MD 10/12/2023 06:29 AM EDT RP Workstation: [id number]   CT HEAD WO CONTRAST ( ) Result Date: 10/11/2023 CLINICAL DATA:  Seizure disorder, clinical change EXAM: CT HEAD WITHOUT CONTRAST TECHNIQUE: Contiguous axial images were obtained from the base of the skull through the vertex without intravenous contrast. RADIATION DOSE REDUCTION: This exam was performed according to the departmental dose-optimization program which includes automated exposure control, adjustment of the mA and/or kV according to patient size and/or use of iterative reconstruction technique. COMPARISON:  07/13/2023  FINDINGS: Brain: Area of encephalomalacia within the posterior right temporal and parietal lobes again noted, unchanged. There is atrophy and chronic small vessel disease changes. No acute intracranial abnormality. Specifically, no hemorrhage, hydrocephalus, mass lesion, acute infarction, or significant intracranial injury. Vascular: No hyperdense vessel or unexpected calcification. Skull: No acute calvarial abnormality. Sinuses/Orbits: No acute findings Other: None IMPRESSION: Atrophy, chronic microvascular disease. No acute intracranial abnormality. Stable right temporoparietal encephalomalacia. Electronically Signed   By: Franky Crease M.D.   On: 10/11/2023 21:25   DG Chest Portable 1 View Result Date: 10/11/2023 CLINICAL DATA:  ams EXAM: PORTABLE CHEST 1 VIEW COMPARISON:  None Available. FINDINGS: The heart and mediastinal contours are within normal limits. No focal consolidation. No pulmonary edema. No pleural effusion. No pneumothorax. No acute osseous abnormality. IMPRESSION: No active  disease. Electronically Signed   By: Morgane  Naveau M.D.   On: 10/11/2023 21:12    Scheduled Meds:  aspirin  EC  81 mg Oral Daily   enoxaparin  (LOVENOX ) injection  40 mg Subcutaneous Q24H   folic acid   1 mg Oral Daily   levETIRAcetam   500 mg Oral BID   losartan   25 mg Oral Daily   multivitamin with minerals  1 tablet Oral Daily   pravastatin   20 mg Oral Daily   sodium chloride  flush  3 mL Intravenous Q12H   sodium chloride  flush  3 mL Intravenous Q12H   thiamine   100 mg Oral Daily   Or   thiamine   100 mg Intravenous Daily   Continuous Infusions:   LOS: 2 days   Time spent: 50 minutes  Camellia Door, DO  [facility]  10/13/2023, 1:20 PM

## 2023-10-13 NOTE — [place] FL2 (Signed)
 [place]  MEDICAID FL2 LEVEL OF CARE FORM     IDENTIFICATION  Patient Name: Dominic Carter Birthdate: September 21, 1955 Sex: male Admission Date (Current Location): 10/11/2023  [facility] and [place] Number:  Producer, television/film/video and Address:  [facility],  [address] 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) 10/11/2023   Altered mental status-secondary to seizure and alcohol withdrawal 10/11/2023   Transaminitis 09/01/2021   Tobacco abuse 09/01/2021   History of CVA (cerebrovascular accident) 09/01/2021   Alcohol withdrawal (HCC)    Hyperlipidemia 01/30/2020   Seizure (HCC) 01/20/2020   ETOH abuse 01/20/2020   Essential hypertension 01/20/2020   Chronic ischemic right MCA stroke 01/20/2020    Orientation RESPIRATION BLADDER Height & Weight     Self  Normal Incontinent Weight: 131 lb 13.4 oz (59.8 kg) Height:  5' 11 (180.3 cm)  BEHAVIORAL SYMPTOMS/MOOD NEUROLOGICAL BOWEL NUTRITION STATUS    Convulsions/Seizures Incontinent Diet (Heart healthy diet)  AMBULATORY STATUS COMMUNICATION OF NEEDS Skin   Extensive Assist Verbally Normal                       Personal Care Assistance Level of Assistance  Bathing, Feeding, Dressing Bathing Assistance: Limited assistance Feeding assistance: Independent Dressing Assistance: Limited assistance     Functional Limitations Info  Sight, Hearing, Speech Sight Info: Adequate Hearing Info: Adequate Speech Info: Adequate    SPECIAL CARE FACTORS  FREQUENCY  PT (By licensed PT), OT (By licensed OT)     PT Frequency: 5x's/week OT Frequency: 5x's/week            Contractures Contractures Info: Not present    Additional Factors Info  Code Status, Allergies, Psychotropic Code Status Info: Full Allergies Info: Sulfa  Antibiotics, Castor Oil, Other Psychotropic Info: See discharge summary         Current Medications (10/13/2023):  This is the current hospital active medication list Current Facility-Administered Medications  Medication Dose Route Frequency Provider Last Rate Last Admin   acetaminophen  (TYLENOL ) tablet 650 mg  650 mg Oral Q6H PRN Sundil, Subrina, MD       Or   acetaminophen  (TYLENOL ) suppository 650 mg  650 mg Rectal Q6H PRN Sundil, Subrina, MD       aspirin  EC tablet 81 mg  81 mg Oral Daily Sundil, Subrina, MD   81 mg at 10/13/23 9081   enoxaparin  (LOVENOX ) injection 40 mg  40 mg Subcutaneous Q24H Sundil, Subrina, MD   40 mg at 10/13/23 9081   folic acid  (FOLVITE ) tablet 1 mg  1 mg Oral Daily Sundil, Subrina, MD   1 mg at 10/13/23 9081   hydrALAZINE  (APRESOLINE ) injection 10 mg  10 mg Intravenous Q6H PRN Sundil, Subrina, MD       levETIRAcetam  (KEPPRA ) tablet 500 mg  500 mg Oral BID Laurence Locus, DO   500 mg at 10/13/23 9081   LORazepam  (ATIVAN ) tablet 1-4 mg  1-4 mg Oral Q1H PRN Sundil, Subrina, MD  2 mg at 10/13/23 9086   Or   LORazepam  (ATIVAN ) injection 1-4 mg  1-4 mg Intravenous Q1H PRN Sundil, Subrina, MD   2 mg at 10/12/23 2029   LORazepam  (ATIVAN ) injection 2 mg  2 mg Intravenous Q6H PRN Sundil, Subrina, MD       losartan  (COZAAR ) tablet 25 mg  25 mg Oral Daily Sundil, Subrina, MD   25 mg at 10/13/23 9081   multivitamin with minerals tablet 1 tablet  1 tablet Oral Daily Sundil, Subrina, MD   1 tablet at 10/13/23 9081   ondansetron  (ZOFRAN ) tablet 4 mg  4 mg Oral Q6H PRN Sundil, Subrina, MD       Or   ondansetron  (ZOFRAN ) injection 4 mg  4 mg Intravenous Q6H PRN Sundil, Subrina, MD   4 mg at 10/11/23 2241    pravastatin  (PRAVACHOL ) tablet 20 mg  20 mg Oral Daily Sundil, Subrina, MD   20 mg at 10/13/23 9081   prochlorperazine  (COMPAZINE ) injection 10 mg  10 mg Intravenous Q6H PRN Sundil, Subrina, MD       sodium chloride  flush (NS) 0.9 % injection 3 mL  3 mL Intravenous Q12H Sundil, Subrina, MD   3 mL at 10/13/23 0919   sodium chloride  flush (NS) 0.9 % injection 3 mL  3 mL Intravenous PRN Sundil, Subrina, MD       sodium chloride  flush (NS) 0.9 % injection 3 mL  3 mL Intravenous Q12H Sundil, Subrina, MD   3 mL at 10/13/23 0919   sodium chloride  flush (NS) 0.9 % injection 3 mL  3 mL Intravenous PRN Sundil, Subrina, MD       thiamine  (VITAMIN B1) tablet 100 mg  100 mg Oral Daily Sundil, Subrina, MD   100 mg at 10/13/23 9081   Or   thiamine  (VITAMIN B1) injection 100 mg  100 mg Intravenous Daily Sundil, Subrina, MD         Discharge Medications: Please see discharge summary for a list of discharge medications.  Relevant Imaging Results:  Relevant Lab Results:   Additional Information SSN: [id number]  Duwaine GORMAN Aran, LCSW

## 2023-10-14 DIAGNOSIS — F10930 Alcohol use, unspecified with withdrawal, uncomplicated: Secondary | ICD-10-CM

## 2023-10-14 DIAGNOSIS — E785 Hyperlipidemia, unspecified: Secondary | ICD-10-CM

## 2023-10-14 DIAGNOSIS — R569 Unspecified convulsions: Secondary | ICD-10-CM | POA: Diagnosis not present

## 2023-10-14 DIAGNOSIS — Z8673 Personal history of transient ischemic attack (TIA), and cerebral infarction without residual deficits: Secondary | ICD-10-CM | POA: Diagnosis not present

## 2023-10-14 NOTE — Progress Notes (Signed)
       Overnight   NAME: Dominic Carter MRN: [id number] DOB : [birth year]    Date of Service   10/14/2023   HPI/Events of Note    Notified by RN for fall.  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 Admitted through ER for evaluation of seizure and altered mental status. Family reports patient is using alcohol and they have concern for other substances. Last drink of alcohol reportedly on 10/12/2023.  Earlier this evening patient began exhibiting signs and symptoms of withdrawal. Patient has been receiving Ativan  through the evening with elevated CIWA  With concern for potential fall, fall arresting pads have been in place, bed alarm has been active. TeleSitter has been in place.  RN was alerted by bed alarm, immediately responding to room and finding patient on the way down to fall arresting pad with no apparent injury.  Bedside visit This was a witnessed fall by TeleSitter. No deformities, contusions, abrasions, punctures, bruising, tears, lacerations, swelling. Patient is not on chronic anticoagulation. DVT prophylaxis in hospital.  It is reported/verified that patient did not hit his head.    Interventions/ Plan   Continue fall precautions Continue CIWA. Continue Tele-Sitter, potentially will require physical sitter as this is a reoccurring problem Continue attending orders.      Update 0607 hrs:     Physical sitter will be placed this AM - order in place   Lynwood Kipper BSN MSNA MSN ACNPC-AG Acute Care Nurse Practitioner [facility]

## 2023-10-14 NOTE — Progress Notes (Signed)
 OT Cancellation Note  Patient Details Name: ELFEGO GIAMMARINO MRN: [id number] DOB: [birth year]   Cancelled Treatment:    Reason Eval/Treat Not Completed: Other (comment) (Discussed with nsg. Pt sleeping from Ativan  given last night. Will follow up at a later time.)  [facility] 10/14/2023, 10:03 AM Kreg Sink, OT/L   Acute OT Clinical Specialist Acute Rehabilitation Services Pager [phone] Office [phone]

## 2023-10-14 NOTE — TOC Progression Note (Signed)
 Transition of Care [facility]) - Progression Note   Patient Details  Name: Dominic Carter MRN: [id number] Date of Birth: [birth year]  Transition of Care [facility]) CM/SW Contact  Duwaine GORMAN Aran, LCSW Phone Number: 10/14/2023, 12:05 PM  Clinical Narrative: Patient received the following bed offers:  [facility] [address] [place], [place] [address] [phone] Overall rating ? Much below average  [facility] [address] [place], [place] [address] [phone] Overall rating ?? Below average  CSW spoke with niece regarding bed offers and [facility] was chosen. CSW followed up with Kia in admissions. Per Kia, as the patient has a sitter the facility cannot admit him for at least 24 hours and [facility] may not have a bed available until 10/16/23 since he is not ready today. CSW updated hospitalist and niece. TOC to follow.  Expected Discharge Plan: Skilled Nursing Facility Barriers to Discharge: SNF Pending bed offer, Insurance Authorization  Expected Discharge Plan and Services In-house Referral: Clinical Social Work Post Acute Care Choice: Skilled Nursing Facility Living arrangements for the past 2 months: Single Family Home             DME Arranged: N/A DME Agency: NA  Social Determinants of Health (SDOH) Interventions SDOH Screenings   Food Insecurity: No Food Insecurity (10/12/2023)  Housing: Low Risk  (10/12/2023)  Transportation Needs: No Transportation Needs (10/12/2023)  Utilities: Not At Risk (10/12/2023)  Financial Resource Strain: Medium Risk (04/04/2022)   Received from [facility]  Physical Activity: Unknown (04/04/2022)   Received from [facility]  Social Connections: Socially Isolated (10/12/2023)  Stress: No Stress Concern Present (04/04/2022)   Received from [facility]  Tobacco Use: High Risk (10/11/2023)   Readmission Risk Interventions     No data to display

## 2023-10-14 NOTE — Evaluation (Signed)
 Occupational Therapy Evaluation Patient Details Name: Dominic Carter MRN: [id number] DOB: Aug 21, 1955 Today's Date: 10/14/2023   History of Present Illness   Dominic Carter is a 68 y.o. male admittd for seizure secondary to alcohol withdrawal and chronic alcohol use; acute metabolic encephalopathy PMH: HTN, hyperlipidemia, CVA, right BKA, seizure disorder.     Clinical Impressions Pt lethargic on entry however level of arousal and participation with OT improved as session progressed. PTA pt states he lived independently with his niece & mother and primarily used his wc in the home for mobility (unsure of accuracy due to impaired cognition). Able to assist with self care on EOB then transfer to recliner with mod A + 2 for safety; overall mod to max A with LB ADL due to deficits listed below. Recommend nsg staff drop arm of recliner and have pt perform a squat pivot transfer back to bed - discussed with nursing. Patient will benefit from continued inpatient follow up therapy, <3 hours/day to maximize functional level of independence with goal to return home. Acute OT to follow.      If plan is discharge home, recommend the following:   Two people to help with walking and/or transfers;A lot of help with bathing/dressing/bathroom     Functional Status Assessment   Patient has had a recent decline in their functional status and demonstrates the ability to make significant improvements in function in a reasonable and predictable amount of time.     Equipment Recommendations   None recommended by OT     Recommendations for Other Services         Precautions/Restrictions   Precautions Precautions: Fall Precaution/Restrictions Comments: seizure Required Braces or Orthoses:  (R prosthetic) Restrictions Weight Bearing Restrictions Per Provider Order: No     Mobility Bed Mobility Overal bed mobility: Needs Assistance Bed Mobility: Supine to Sit     Supine to sit: Min  assist, HOB elevated, Used rails          Transfers Overall transfer level: Needs assistance Equipment used: Rolling walker (2 wheels) Transfers: Sit to/from Stand, Bed to chair/wheelchair/BSC Sit to Stand: Min assist Stand pivot transfers: Mod assist, +2 safety/equipment                Balance Overall balance assessment: Needs assistance, History of Falls Sitting-balance support: Feet supported, Bilateral upper extremity supported Sitting balance-Leahy Scale: Fair     Standing balance support: Reliant on assistive device for balance, During functional activity, Bilateral upper extremity supported Standing balance-Leahy Scale: Poor                             ADL either performed or assessed with clinical judgement   ADL Overall ADL's : Needs assistance/impaired Eating/Feeding: Supervision/ safety;Set up Eating/Feeding Details (indicate cue type and reason): once OOB in chair; NT assisted earlier due to LOA Grooming: Minimal assistance   Upper Body Bathing: Set up;Supervision/ safety;Sitting   Lower Body Bathing: Moderate assistance;Sit to/from stand   Upper Body Dressing : Minimal assistance;Sitting   Lower Body Dressing: Moderate assistance;Sit to/from stand   Toilet Transfer: Moderate assistance;Stand-pivot     Toileting - Clothing Manipulation Details (indicate cue type and reason): male purewick     Functional mobility during ADLs: Moderate assistance;Rolling walker (2 wheels);Cueing for safety       Vision Baseline Vision/History: 1 Wears glasses Additional Comments: states no vision problems at baseline     Perception  Praxis         Pertinent Vitals/Pain Pain Assessment Pain Assessment: Faces Faces Pain Scale: Hurts a little bit Pain Location: generalized Pain Descriptors / Indicators: Grimacing Pain Intervention(s): Limited activity within patient's tolerance     Extremity/Trunk Assessment Upper Extremity  Assessment Upper Extremity Assessment: Generalized weakness   Lower Extremity Assessment Lower Extremity Assessment: Defer to PT evaluation RLE Deficits / Details: R BKA   Cervical / Trunk Assessment Cervical / Trunk Assessment: Normal   Communication Communication Communication: Impaired Factors Affecting Communication: Reduced clarity of speech   Cognition Arousal: Lethargic, Suspect due to medications Behavior During Therapy: Flat affect (pt tearful at times regardin ghis situation and inability to recall events; states he was worried about his Mom); I had another seizure? Again? Cognition: Cognition impaired   Orientation impairments: Place, Time, Situation Awareness: Intellectual awareness impaired, Online awareness impaired Memory impairment (select all impairments): Short-term memory, Working Civil Service fast streamer, Conservation officer, historic buildings Attention impairment (select first level of impairment): Sustained attention Executive functioning impairment (select all impairments): Initiation, Reasoning, Problem solving OT - Cognition Comments: level of arousal improved as session progressed                 Following commands: Impaired Following commands impaired: Follows one step commands with increased time     Cueing  General Comments   Cueing Techniques: Verbal cues;Tactile cues;Gestural cues      Exercises     Shoulder Instructions      Home Living Family/patient expects to be discharged to:: Private residence Living Arrangements: Other relatives (niece) Available Help at Discharge: Available PRN/intermittently Type of Home: House Home Access: Stairs to enter [employer] of Steps: 3 Entrance Stairs-Rails: Right;Left;Can reach both Home Layout: One level     Bathroom Shower/Tub: Chief Strategy Officer: Standard     Home Equipment: Agricultural consultant (2 wheels);Cane - single point;Wheelchair - manual   Additional Comments: R prosthesis       Prior Functioning/Environment Prior Level of Function : Independent/Modified Independent;Patient poor historian/Family not available             Mobility Comments: pt reports using RW or w/c for mobility, primarily uses wc in the house ADLs Comments: pt reports ind with self care, niece completes household chores however pt states he helps take care of his Mom    OT Problem List: Decreased strength;Decreased activity tolerance;Impaired balance (sitting and/or standing);Decreased cognition;Decreased safety awareness   OT Treatment/Interventions: Self-care/ADL training;Therapeutic exercise;DME and/or AE instruction;Therapeutic activities;Cognitive remediation/compensation;Patient/family education;Balance training      OT Goals(Current goals can be found in the care plan section)   Acute Rehab OT Goals Patient Stated Goal: to get better OT Goal Formulation: Patient unable to participate in goal setting Time For Goal Achievement: 10/28/23 Potential to Achieve Goals: Good   OT Frequency:  Min 2X/week    Co-evaluation              AM-PAC OT 6 Clicks Daily Activity     Outcome Measure Help from another person eating meals?: A Little Help from another person taking care of personal grooming?: A Little Help from another person toileting, which includes using toliet, bedpan, or urinal?: Total Help from another person bathing (including washing, rinsing, drying)?: A Lot Help from another person to put on and taking off regular upper body clothing?: A Little Help from another person to put on and taking off regular lower body clothing?: A Lot 6 Click Score: 14   End of Session  Equipment Utilized During Treatment: Gait belt;Rolling walker (2 wheels) Nurse Communication: Mobility status  Activity Tolerance: Patient tolerated treatment well Patient left: in chair;with call bell/phone within reach;with chair alarm set;with nursing/sitter in room  OT Visit Diagnosis:  Unsteadiness on feet (R26.81);Other abnormalities of gait and mobility (R26.89);Muscle weakness (generalized) (M62.81);Other symptoms and signs involving cognitive function                Time: 8478-8446 OT Time Calculation (min): 32 min Charges:  OT General Charges $OT Visit: 1 Visit OT Evaluation $OT Eval Moderate Complexity: 1 Mod OT Treatments $Self Care/Home Management : 8-22 mins  Kreg Sink, OT/L   Acute OT Clinical Specialist Acute Rehabilitation Services Pager [phone] Office [phone]   [facility] 10/14/2023, 7:18 PM
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
## 2023-10-14 NOTE — Progress Notes (Signed)
 [facility]                                                                               Nyair Depaulo, is a 68 y.o. male, DOB - 1956/01/04, FMW:[id number] Admit date - 10/11/2023    Outpatient Primary MD for the patient is Leron Millman, NP  LOS - 3  days    Brief summary   HPI: Dominic Carter is a 68 y.o. 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 Family reported patient is using alcohol and concern for other substance use as well.  Last drink of alcohol yesterday. Per chart neurology from [facility] impression was patient has alcohol withdrawal related seizure.  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 Once patient is off the restraints will plan for short-term SNF on discharge   Mild hyponatremia Repeat BMP in the morning    Estimated body mass index is 18.39 kg/m as calculated from the following:   Height as of this encounter: 5' 11 (1.803 m).   Weight as of this encounter: 59.8 kg.  Code Status: Full code DVT Prophylaxis:  enoxaparin  (LOVENOX ) injection 40 mg Start: 10/12/23 1000 SCDs Start: 10/11/23 2223 Place TED hose Start: 10/11/23 2223   Level of  Care: Level of care: Progressive Family Communication: None at bedside  Disposition Plan:     Remains inpatient appropriate: Pending clinical improvement  Procedures:  None  Consultants:   None  Antimicrobials:   Anti-infectives (From admission, onward)    None        Medications  Scheduled Meds:  aspirin  EC  81 mg Oral Daily   enoxaparin  (LOVENOX ) injection  40 mg Subcutaneous Q24H   folic acid   1 mg Oral Daily   levETIRAcetam   500 mg Oral BID   losartan   25 mg Oral Daily   multivitamin with minerals  1 tablet Oral Daily   nicotine   7 mg Transdermal Once   pravastatin   20 mg Oral Daily   sodium chloride  flush  3 mL Intravenous Q12H   sodium chloride  flush  3 mL Intravenous Q12H   thiamine   100 mg Oral Daily   Or   thiamine   100 mg Intravenous Daily   Continuous Infusions: PRN Meds:.acetaminophen  **OR** acetaminophen , hydrALAZINE , LORazepam  **OR** LORazepam , LORazepam , ondansetron  **OR** ondansetron  (ZOFRAN ) IV, prochlorperazine , sodium chloride  flush, sodium chloride  flush    Subjective:   Dominic Carter was seen and examined today.  Overnight patient had a fall and he was restless.  Safety observation and  soft restraints have been ordered  Objective:   Vitals:   10/13/23 2019 10/14/23 0343 10/14/23 0541 10/14/23 0754  BP: 122/78 (!) 152/89 (!) 158/93 (!) 143/87  Pulse: 98  97 81  Resp: 18 18 19 16   Temp: 98 F (36.7 C) 97.7 F (36.5 C) 98.1 F (36.7 C)   TempSrc: Oral Oral Oral   SpO2: 100% 100% 100% 98%  Weight:      Height:        Intake/Output Summary (Last 24 hours) at 10/14/2023 1458 Last data filed at 10/14/2023 0226 Gross per 24 hour  Intake 240 ml  Output 1350 ml  Net -1110 ml   Filed Weights   10/11/23 1534 10/12/23 0211  Weight: 65.8 kg 59.8 kg     Exam General: Alert and oriented x 3, NAD Cardiovascular: S1 S2 auscultated, no murmurs, RRR Respiratory: Clear to auscultation bilaterally, no wheezing, rales or  rhonchi Gastrointestinal: Soft, nontender, nondistended, + bowel sounds Ext: no pedal edema bilaterally Neuro: Alert and oriented to person only.  Able to move all extremities   Data Reviewed:  I have personally reviewed following labs and imaging studies   CBC Lab Results  Component Value Date   WBC 7.7 10/12/2023   RBC 4.26 10/12/2023   HGB 12.2 (L) 10/12/2023   HCT 37.6 (L) 10/12/2023   MCV 88.3 10/12/2023   MCH 28.6 10/12/2023   PLT 147 (L) 10/12/2023   MCHC 32.4 10/12/2023   RDW 18.6 (H) 10/12/2023   LYMPHSABS 1.5 07/13/2023   MONOABS 1.1 (H) 07/13/2023   EOSABS 0.0 07/13/2023   BASOSABS 0.1 07/13/2023     Last metabolic panel Lab Results  Component Value Date   NA 133 (L) 10/12/2023   K 3.5 10/12/2023   CL 99 10/12/2023   CO2 23 10/12/2023   BUN 6 (L) 10/12/2023   CREATININE 0.75 10/12/2023   GLUCOSE 91 10/12/2023   GFRNONAA >60 10/12/2023   GFRAA 90 02/03/2020   CALCIUM  8.3 (L) 10/12/2023   PHOS 4.5 09/06/2021   PROT 7.4 10/12/2023   ALBUMIN 3.0 (L) 10/12/2023   BILITOT 1.3 (H) 10/12/2023   ALKPHOS 75 10/12/2023   AST 38 10/12/2023   ALT 24 10/12/2023   ANIONGAP 11 10/12/2023    CBG (last 3)  Recent Labs    10/11/23 2049  GLUCAP 125*      Coagulation Profile: No results for input(s): INR, PROTIME in the last 168 hours.   Radiology Studies: No results found.     Elgie Butter M.D. [facility] 10/14/2023, 2:58 PM  Available via Epic secure chat 7am-7pm After 7 pm, please refer to night coverage provider listed on amion.

## 2023-10-14 NOTE — Progress Notes (Signed)
 OT Note Pt seen for OT eval. Pt able to progress OOB to chair with +2 assist. Pt will benefit from post acute rehab. Full note to follow.  Kreg Sink, OT/L   Acute OT Clinical Specialist Acute Rehabilitation Services Pager [phone] Office [phone]

## 2023-10-14 NOTE — Progress Notes (Signed)
   10/14/23 0541  What Happened  Was fall witnessed? Yes  Who witnessed fall? tele sitter  Patients activity before fall to/from bed, chair, or stretcher  Point of contact buttocks  Was patient injured? No  Patient found on floor  Found by Staff-comment ([place])  Stated prior activity to/from bed, chair, or stretcher  Provider Notification  Provider Name/Title Lynwood Kipper, NP  Date Provider Notified 10/14/23  Time Provider Notified [phone]  Method of Notification Page (secure chat)  Notification Reason Fall  Provider response See new orders  Date of Provider Response 10/14/23  Time of Provider Response 0546  Adult Fall Risk Assessment  Risk Factor Category (scoring not indicated) Fall has occurred during this admission (document High fall risk)  Patient Fall Risk Level High fall risk  Adult Fall Risk Interventions  Required Bundle Interventions *See Row Information* High fall risk - low, moderate, and high requirements implemented  Additional Interventions Safety Sitter/Safety Rounder;Use of appropriate toileting equipment (bedpan, BSC, etc.)  Screening for Fall Injury Risk (To be completed on HIGH fall risk patients) - Assessing Need for Floor Mats  Risk For Fall Injury- Criteria for Floor Mats Previous fall this admission;Confusion/dementia (+NuDESC, CIWA, TBI, etc.);Noncompliant with safety precautions  Will Implement Floor Mats Yes  Vitals  Temp 98.1 F (36.7 C)  Temp Source Oral  BP (!) 158/93  MAP (mmHg) 110  BP Location Left Arm  BP Method Automatic  Patient Position (if appropriate) Lying  Pulse Rate 97  Pulse Rate Source Monitor  Resp 19  Oxygen Therapy  SpO2 100 %  O2 Device Room Air  Pain Assessment  Pain Scale 0-10  Pain Score 0  PCA/Epidural/Spinal Assessment  Respiratory Pattern Regular;Unlabored  Neurological  Neuro (WDL) X  Level of Consciousness Alert  Orientation Level Oriented to person;Disoriented to place;Disoriented to time;Disoriented to  situation  Cognition Poor attention/concentration;Poor safety awareness;Unable to follow commands  Speech Delayed responses  Neuro Symptoms Agitation  Glasgow Coma Scale  Eye Opening 4  Best Verbal Response (NON-intubated) 4  Best Motor Response 6  Musculoskeletal  Musculoskeletal (WDL) X  Assistive Device None  Generalized Weakness Yes  Weight Bearing Restrictions Per Provider Order No  Musculoskeletal Details  Right Knee Below knee amputation  Integumentary  Integumentary (WDL) X  Skin Color Appropriate for ethnicity  Skin Condition Dry;Flaky  Skin Integrity Other (Comment) (see LDA)  Skin Turgor Non-tenting  Pain Assessment  Work-Related Injury No   Patient was found sitting on the floor mat. Tele sitter witnessed the fall. Bed is in the lowest position, side rails up, bed alarm on, call bell within reach, patient is dry, sheets and primofit were changed at the time of the event.  No Injuries noted. Vital sign stable. On call provider made aware.

## 2023-10-15 ENCOUNTER — Encounter (HOSPITAL_COMMUNITY): Payer: Self-pay | Admitting: Internal Medicine

## 2023-10-15 DIAGNOSIS — R569 Unspecified convulsions: Secondary | ICD-10-CM | POA: Diagnosis not present

## 2023-10-15 DIAGNOSIS — Z89511 Acquired absence of right leg below knee: Secondary | ICD-10-CM

## 2023-10-15 DIAGNOSIS — F10930 Alcohol use, unspecified with withdrawal, uncomplicated: Secondary | ICD-10-CM | POA: Diagnosis not present

## 2023-10-15 DIAGNOSIS — Z8673 Personal history of transient ischemic attack (TIA), and cerebral infarction without residual deficits: Secondary | ICD-10-CM | POA: Diagnosis not present

## 2023-10-15 DIAGNOSIS — E785 Hyperlipidemia, unspecified: Secondary | ICD-10-CM | POA: Diagnosis not present

## 2023-10-15 LAB — BASIC METABOLIC PANEL WITH GFR
Anion gap: 10 (ref 5–15)
BUN: 9 mg/dL (ref 8–23)
CO2: 21 mmol/L — ABNORMAL LOW (ref 22–32)
Calcium: 8.5 mg/dL — ABNORMAL LOW (ref 8.9–10.3)
Chloride: 100 mmol/L (ref 98–111)
Creatinine, Ser: 0.75 mg/dL (ref 0.61–1.24)
GFR, Estimated: >60 mL/min (ref >60)
Glucose, Bld: 93 mg/dL (ref 70–99)
Potassium: 3.8 mmol/L (ref 3.5–5.1)
Sodium: 131 mmol/L — ABNORMAL LOW (ref 135–145)

## 2023-10-15 LAB — SODIUM, URINE, RANDOM: Sodium, Ur: 48 mmol/L

## 2023-10-15 LAB — OSMOLALITY, URINE: Osmolality, Ur: 457 mosm/kg (ref 300–900)

## 2023-10-15 MED ORDER — LACTATED RINGERS IV BOLUS
1000.0000 mL | Freq: Once | INTRAVENOUS | Status: AC
  Administered 2023-10-15: 1000 mL via INTRAVENOUS

## 2023-10-15 MED ORDER — NICOTINE 14 MG/24HR TD PT24
14.0000 mg | MEDICATED_PATCH | Freq: Every day | TRANSDERMAL | Status: DC
  Administered 2023-10-15 – 2023-10-19 (×5): 14 mg via TRANSDERMAL
  Filled 2023-10-15 (×5): qty 1

## 2023-10-15 NOTE — Progress Notes (Signed)
 [facility]                                                                               Dominic Carter, is a 68 y.o. male, DOB - 08/21/1955, FMW:[id number] Admit date - 10/11/2023    Outpatient Primary MD for the patient is Leron Millman, NP  LOS - 4  days    Brief summary   HPI: Dominic Carter is a 68 y.o. 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 Family reported patient is using alcohol and concern for other substance use as well.  Last drink of alcohol yesterday. Per chart neurology from [facility] impression was patient has alcohol withdrawal related seizure.  Patient was admitted for recurrent seizures probably secondary to alcohol withdrawal   Assessment & Plan    Assessment and Plan: * Seizure due to alcohol withdrawal (HCC)/acute metabolic encephalopathy secondary to alcohol withdrawals and seizures EEG showed cortical dysfunction of the right temporal region secondary to underlying stroke Continue with oral Keppra  Continue with CIWA, as per RN patient received up to 9 mg of IV ativan  overnight.  Continue with safety observation and soft restraints.    History of CVA (cerebrovascular accident) Continue with aspirin  Hyperlipidemia Continue with Pravachol   Essential hypertension BP parameters are sub optimal this afternoon, cozaar  held.  Recheck BP parameters this afternoon.    Hx of BKA, right [facility]) Therapy evaluations recommending SNF. Once patient is off the restraints will plan for short-term SNF on discharge   Mild hyponatremia Sodium is down to 131 . Continue to monitor.  Check Thyroid panel. AM cortisol level pending.  Serum osmo, urine osmo and urine sodium     Estimated body mass index is 18.39 kg/m as  calculated from the following:   Height as of this encounter: 5' 11 (1.803 m).   Weight as of this encounter: 59.8 kg.  Code Status: Full code DVT Prophylaxis:  enoxaparin  (LOVENOX ) injection 40 mg Start: 10/12/23 1000 SCDs Start: 10/11/23 2223 Place TED hose Start: 10/11/23 2223   Level of Care: Level of care: Progressive Family Communication: None at bedside  Disposition Plan:     Remains inpatient appropriate: Pending clinical improvement  Procedures:  None  Consultants:   None  Antimicrobials:   Anti-infectives (From admission, onward)    None        Medications  Scheduled Meds:  aspirin  EC  81 mg Oral Daily   enoxaparin  (LOVENOX ) injection  40 mg Subcutaneous Q24H   folic acid   1 mg Oral Daily   levETIRAcetam   500 mg Oral BID   losartan   25 mg Oral Daily   multivitamin with minerals  1 tablet Oral Daily   nicotine   14 mg Transdermal Daily   pravastatin   20 mg Oral Daily   sodium chloride  flush  3 mL Intravenous Q12H   sodium chloride  flush  3 mL Intravenous Q12H   thiamine   100 mg Oral Daily   Or   thiamine   100 mg Intravenous  Daily   Continuous Infusions: PRN Meds:.acetaminophen  **OR** acetaminophen , hydrALAZINE , LORazepam  **OR** LORazepam , LORazepam , ondansetron  **OR** ondansetron  (ZOFRAN ) IV, prochlorperazine , sodium chloride  flush, sodium chloride  flush    Subjective:   Dominic Carter was seen and examined today.  Pt denies any  new complaints. No chest pain or sob. No nausea or vomiting.   Objective:   Vitals:   10/14/23 0541 10/14/23 0754 10/14/23 2145 10/15/23 1253  BP: (!) 158/93 (!) 143/87 (!) 154/88 (!) 88/58  Pulse: 97 81 80 94  Resp: 19 16 20 16   Temp: 98.1 F (36.7 C)  97.7 F (36.5 C) 97.7 F (36.5 C)  TempSrc: Oral  Oral Oral  SpO2: 100% 98% 98% 100%  Weight:      Height:        Intake/Output Summary (Last 24 hours) at 10/15/2023 1519 Last data filed at 10/15/2023 1015 Gross per 24 hour  Intake 3 ml  Output 700 ml  Net  -697 ml   Filed Weights   10/11/23 1534 10/12/23 0211  Weight: 65.8 kg 59.8 kg     Exam General exam: Appears calm and comfortable  Respiratory system: Clear to auscultation. Respiratory effort normal. Cardiovascular system: S1 & S2 heard, RRR. No JVD, Gastrointestinal system: Abdomen is nondistended, soft and nontender. Central nervous system: Alert and oriented to person and place only.  Extremities: no pedal edema.  Skin: No rashes,  Psychiatry: mood is appropriate.     Data Reviewed:  I have personally reviewed following labs and imaging studies   CBC Lab Results  Component Value Date   WBC 7.7 10/12/2023   RBC 4.26 10/12/2023   HGB 12.2 (L) 10/12/2023   HCT 37.6 (L) 10/12/2023   MCV 88.3 10/12/2023   MCH 28.6 10/12/2023   PLT 147 (L) 10/12/2023   MCHC 32.4 10/12/2023   RDW 18.6 (H) 10/12/2023   LYMPHSABS 1.5 07/13/2023   MONOABS 1.1 (H) 07/13/2023   EOSABS 0.0 07/13/2023   BASOSABS 0.1 07/13/2023     Last metabolic panel Lab Results  Component Value Date   NA 131 (L) 10/15/2023   K 3.8 10/15/2023   CL 100 10/15/2023   CO2 21 (L) 10/15/2023   BUN 9 10/15/2023   CREATININE 0.75 10/15/2023   GLUCOSE 93 10/15/2023   GFRNONAA >60 10/15/2023   GFRAA 90 02/03/2020   CALCIUM  8.5 (L) 10/15/2023   PHOS 4.5 09/06/2021   PROT 7.4 10/12/2023   ALBUMIN 3.0 (L) 10/12/2023   BILITOT 1.3 (H) 10/12/2023   ALKPHOS 75 10/12/2023   AST 38 10/12/2023   ALT 24 10/12/2023   ANIONGAP 10 10/15/2023    CBG (last 3)  No results for input(s): GLUCAP in the last 72 hours.     Coagulation Profile: No results for input(s): INR, PROTIME in the last 168 hours.   Radiology Studies: No results found.     Elgie Butter M.D. [facility] 10/15/2023, 3:19 PM  Available via Epic secure chat 7am-7pm After 7 pm, please refer to night coverage provider listed on amion.

## 2023-10-15 NOTE — Progress Notes (Signed)
 Physical Therapy Treatment Patient Details Name: Dominic Carter MRN: [id number] DOB: [birth year] Today's Date: 10/15/2023   History of Present Illness Dominic Carter is a 68 y.o. male admittd for seizure secondary to alcohol withdrawal and chronic alcohol use; acute metabolic encephalopathy PMH: HTN, hyperlipidemia, CVA, right BKA, seizure disorder.    PT Comments   Cognition improving.  AxO 2 and following all directions.  He knows he is in the hospital but unable to recall why or past couple of days. Assisted OOB to amb was difficult.  General bed mobility comments: Pt was self able to transition to EOB with slight unsteadiness. Static sitting balance is fair.  Pt was able to partially [facility] his prosthesis requiring 50% assist. General transfer comment: Pt was able to rise from elevated bed at [facility] Assist  with caution to forward LOB as Pt uses momentum and appears impulsive.  50% VC's on safety to have walker before standing.  50% VC's on safety with turn completion prior to sit as Pt tends to sit quickly/uncontrolled. General Gait Details: Pt was able to amb 55 feet with walker and his Prosthetic Leg but required Mod/Max Assist for balance and 50% VC's on proper walker to self distance as he tends to poush walker too far front.  Pt also ehibits R toe drag in which he compensates with increased hip/knee flexion.  50% VC's to decrease gait speed due to impulsiveness.  HIGH FALL RISK. Positioned in recliner with safety sitter in room. Pt will need ST Rehab at SNF to address mobility and functional decline prior to safely returning home.    If plan is discharge home, recommend the following: A lot of help with walking and/or transfers;A little help with bathing/dressing/bathroom;Assistance with cooking/housework;Assist for transportation;Help with stairs or ramp for entrance   Can travel by private vehicle     No  Equipment Recommendations  None recommended by PT    Recommendations for  Other Services       Precautions / Restrictions Precautions Precautions: Fall Precaution/Restrictions Comments: seizure Restrictions Weight Bearing Restrictions Per Provider Order: No     Mobility  Bed Mobility Overal bed mobility: Needs Assistance Bed Mobility: Supine to Sit     Supine to sit: Supervision, Contact guard     General bed mobility comments: Pt was self able to transition to EOB with slight unsteadiness. Static sitting balance is fair.  Pt was able to partially [facility] his prosthesis requiring 50% assist.    Transfers Overall transfer level: Needs assistance Equipment used: Rolling walker (2 wheels) Transfers: Sit to/from Stand Sit to Stand: Min assist           General transfer comment: Pt was able to rise from elevated bed at [facility] Assist  with caution to forward LOB as Pt uses momentum and appears impulsive.  50% VC's on safety to have walker before standing.  50% VC's on safety with turn completion prior to sit as Pt tends to sit quickly/uncontrolled.    Ambulation/Gait Ambulation/Gait assistance: Mod assist, Max assist Gait Distance (Feet): 55 Feet Assistive device: Rolling walker (2 wheels) Gait Pattern/deviations: Step-to pattern, Decreased stance time - right, Trunk flexed Gait velocity: too quick     General Gait Details: Pt was able to amb 55 feet with walker and his Prosthetic Leg but required Mod/Max Assist for balance and 50% VC's on proper walker to self distance as he tends to poush walker too far front.  Pt also ehibits R toe drag in which he  compensates with increased hip/knee flexion.  50% VC's to decrease gait speed due to impulsiveness.  HIGH FALL RISK.   Stairs             Wheelchair Mobility     Tilt Bed    Modified Rankin (Stroke Patients Only)       Balance                                            Communication    Cognition Arousal: Alert Behavior During Therapy: WFL for tasks  assessed/performed   PT - Cognitive impairments: No apparent impairments, Memory, Orientation, Problem solving, Safety/Judgement                       PT - Cognition Comments: Cognition improving.  AxO 2 and following all directions.  He knows he is in the hospital but unable to recall why or past couple of days.        Cueing    Exercises      General Comments        Pertinent Vitals/Pain Pain Assessment Pain Assessment: No/denies pain    Home Living                          Prior Function            PT Goals (current goals can now be found in the care plan section) Progress towards PT goals: Progressing toward goals    Frequency    Min 2X/week      PT Plan      Co-evaluation              AM-PAC PT 6 Clicks Mobility   Outcome Measure  Help needed turning from your back to your side while in a flat bed without using bedrails?: A Little Help needed moving from lying on your back to sitting on the side of a flat bed without using bedrails?: A Little Help needed moving to and from a bed to a chair (including a wheelchair)?: A Little Help needed standing up from a chair using your arms (e.g., wheelchair or bedside chair)?: A Lot Help needed to walk in hospital room?: A Lot Help needed climbing 3-5 steps with a railing? : Total 6 Click Score: 14    End of Session Equipment Utilized During Treatment: Gait belt Activity Tolerance: Patient tolerated treatment well Patient left: in chair;with call bell/phone within reach;with restraints reapplied Nurse Communication: Mobility status PT Visit Diagnosis: Unsteadiness on feet (R26.81);Muscle weakness (generalized) (M62.81);History of falling (Z91.81);Difficulty in walking, not elsewhere classified (R26.2)     Time: 8445-8378 PT Time Calculation (min) (ACUTE ONLY): 27 min  Charges:    $Gait Training: 8-22 mins $Therapeutic Activity: 8-22 mins PT General Charges $$ ACUTE PT VISIT:  1 Visit                     Katheryn Leap  PTA Acute  Rehabilitation Services Office M-F          [phone]

## 2023-10-15 NOTE — Plan of Care (Signed)
  Problem: Clinical Measurements: Goal: Will remain free from infection Outcome: Progressing   Problem: Activity: Goal: Risk for activity intolerance will decrease Outcome: Progressing   Problem: Elimination: Goal: Will not experience complications related to urinary retention Outcome: Progressing   Problem: Elimination: Goal: Will not experience complications related to bowel motility Outcome: Progressing

## 2023-10-15 NOTE — Progress Notes (Signed)
   10/15/23 1250  Safety Observation   Observer at bedside Yes

## 2023-10-16 DIAGNOSIS — Z8673 Personal history of transient ischemic attack (TIA), and cerebral infarction without residual deficits: Secondary | ICD-10-CM | POA: Diagnosis not present

## 2023-10-16 DIAGNOSIS — F10930 Alcohol use, unspecified with withdrawal, uncomplicated: Secondary | ICD-10-CM | POA: Diagnosis not present

## 2023-10-16 DIAGNOSIS — R569 Unspecified convulsions: Secondary | ICD-10-CM | POA: Diagnosis not present

## 2023-10-16 DIAGNOSIS — E785 Hyperlipidemia, unspecified: Secondary | ICD-10-CM | POA: Diagnosis not present

## 2023-10-16 LAB — TSH: TSH: 2.831 u[IU]/mL (ref 0.350–4.500)

## 2023-10-16 LAB — OSMOLALITY: Osmolality: 293 mosm/kg (ref 275–295)

## 2023-10-16 LAB — CORTISOL: Cortisol, Plasma: 3.7 ug/dL

## 2023-10-16 NOTE — Plan of Care (Signed)
  Problem: Clinical Measurements: Goal: Will remain free from infection Outcome: Progressing   Problem: Clinical Measurements: Goal: Diagnostic test results will improve Outcome: Progressing   Problem: Nutrition: Goal: Adequate nutrition will be maintained Outcome: Progressing   Problem: Elimination: Goal: Will not experience complications related to bowel motility Outcome: Progressing   Problem: Elimination: Goal: Will not experience complications related to urinary retention Outcome: Progressing

## 2023-10-16 NOTE — Progress Notes (Signed)
 Occupational Therapy Treatment Patient Details Name: Dominic Carter MRN: [id number] DOB: 1955/05/06 Today's Date: 10/16/2023   History of present illness Dominic Carter is a 68 y.o. male admittd for seizure secondary to alcohol withdrawal and chronic alcohol use; acute metabolic encephalopathy PMH: HTN, hyperlipidemia, CVA, right BKA, seizure disorder.   OT comments  Pt making progress with functional goals. Pt in bed upon arrival, sitter present. Pt agreeable to work with OT. Pt sat EOB with Sup and participated in UB ADLs CGA, LB ADLs mod A leaning side to side. Pt impulsive; pt instructed to lean side to side for LB bathing, but pt attempting to stand and squat. Required min verbal cues for safety with hand placement and to wait for therapist to assist before sit - stand in prep for SPT. Pt pleasant and cooperative. OT will continue to follow acutely to maximize level of function and safety      If plan is discharge home, recommend the following:  Two people to help with walking and/or transfers;A lot of help with bathing/dressing/bathroom   Equipment Recommendations  None recommended by OT    Recommendations for Other Services      Precautions / Restrictions Precautions Precautions: Fall;Other (comment) Precaution/Restrictions Comments: seizure Restrictions Weight Bearing Restrictions Per Provider Order: No       Mobility Bed Mobility Overal bed mobility: Needs Assistance Bed Mobility: Supine to Sit, Sit to Supine     Supine to sit: Supervision Sit to supine: Supervision   General bed mobility comments: mod A to don R prosthetic    Transfers Overall transfer level: Needs assistance Equipment used: Rolling walker (2 wheels) Transfers: Sit to/from Stand Sit to Stand: Min assist Stand pivot transfers: Min assist         General transfer comment: Pt impulsive; pt instructed to lean side to side for LB bathing, but pt attempting to stand and squat. Required min  verbal cues for safety with hand placement and to wait for therapist to assist before sit - stand.     Balance Overall balance assessment: Needs assistance, History of Falls Sitting-balance support: Feet supported, Bilateral upper extremity supported Sitting balance-Leahy Scale: Fair     Standing balance support: Reliant on assistive device for balance, During functional activity, Bilateral upper extremity supported Standing balance-Leahy Scale: Poor                             ADL either performed or assessed with clinical judgement   ADL Overall ADL's : Needs assistance/impaired     Grooming: Wash/dry hands;Wash/dry face;Contact guard assist;Sitting   Upper Body Bathing: Supervision/ safety;Set up;Sitting   Lower Body Bathing: Moderate assistance;Sitting/lateral leans Lower Body Bathing Details (indicate cue type and reason): Pt impulsive; pt instructed to lean side to side for LB bathing, but pt attempting to stand and squat Upper Body Dressing : Contact guard assist;Sitting       Toilet Transfer: Minimal assistance;Stand-pivot;Rolling walker (2 wheels);Cueing for safety           Functional mobility during ADLs: Minimal assistance;Rolling walker (2 wheels);Cueing for safety General ADL Comments: Pt impulsive; pt instructed to lean side to side for LB bathing, but pt attempting to stand and squat. Required min verbal cues for safety with hand placement and to wait for therapist to assist before sit - stand in prep for SPT    Extremity/Trunk Assessment Upper Extremity Assessment Upper Extremity Assessment: Generalized weakness   Lower Extremity  Assessment Lower Extremity Assessment: Defer to PT evaluation        Vision Ability to See in Adequate Light: 0 Adequate Patient Visual Report: No change from baseline     Perception     Praxis     Communication Communication Communication: Impaired Factors Affecting Communication: Reduced clarity of speech    Cognition Arousal: Alert Behavior During Therapy: [facility] for tasks assessed/performed                                 Following commands: Impaired Following commands impaired: Follows one step commands with increased time, Follows one step commands inconsistently      Cueing   Cueing Techniques: Verbal cues, Tactile cues, Gestural cues  Exercises      Shoulder Instructions       General Comments      Pertinent Vitals/ Pain       Pain Assessment Pain Assessment: No/denies pain Faces Pain Scale: No hurt Pain Intervention(s): Monitored during session, Repositioned  Home Living                                          Prior Functioning/Environment              Frequency  Min 2X/week        Progress Toward Goals  OT Goals(current goals can now be found in the care plan section)  Progress towards OT goals: Progressing toward goals     Plan      Co-evaluation                 AM-PAC OT 6 Clicks Daily Activity     Outcome Measure   Help from another person eating meals?: None Help from another person taking care of personal grooming?: A Little Help from another person toileting, which includes using toliet, bedpan, or urinal?: A Lot Help from another person bathing (including washing, rinsing, drying)?: A Lot Help from another person to put on and taking off regular upper body clothing?: A Little Help from another person to put on and taking off regular lower body clothing?: A Lot 6 Click Score: 16    End of Session Equipment Utilized During Treatment: Gait belt;Rolling walker (2 wheels)  OT Visit Diagnosis: Unsteadiness on feet (R26.81);Other abnormalities of gait and mobility (R26.89);Muscle weakness (generalized) (M62.81);Other symptoms and signs involving cognitive function   Activity Tolerance Patient tolerated treatment well   Patient Left with call bell/phone within reach;with nursing/sitter in room;in bed    Nurse Communication Mobility status        Time: 9040-8975 OT Time Calculation (min): 25 min  Charges: OT General Charges $OT Visit: 1 Visit OT Treatments $Self Care/Home Management : 8-22 mins $Therapeutic Activity: 8-22 mins    Jacques Karna Loose 10/16/2023, 1:40 PM

## 2023-10-16 NOTE — TOC Progression Note (Signed)
 Transition of Care [facility]) - Progression Note   Patient Details  Name: Dominic Carter MRN: [id number] Date of Birth: 1956-03-13  [facility] CM/SW Contact  Duwaine GORMAN Aran, LCSW Phone Number: 10/16/2023, 1:08 PM  Clinical Narrative: Patient continues to have a sitter. TOC to start insurance authorization when closer to being medically ready for discharge.  Expected Discharge Plan: Skilled Nursing Facility Barriers to Discharge: SNF Pending bed offer, Insurance Authorization  Expected Discharge Plan and Services In-house Referral: Clinical Social Work Post Acute Care Choice: Skilled Nursing Facility Living arrangements for the past 2 months: Single Family Home          DME Arranged: N/A DME Agency: NA  Social Determinants of Health (SDOH) Interventions SDOH Screenings   Food Insecurity: No Food Insecurity (10/12/2023)  Housing: Low Risk  (10/12/2023)  Transportation Needs: No Transportation Needs (10/12/2023)  Utilities: Not At Risk (10/12/2023)  Financial Resource Strain: Medium Risk (04/04/2022)   Received from [facility]  Physical Activity: Unknown (04/04/2022)   Received from [facility]  Social Connections: Socially Isolated (10/12/2023)  Stress: No Stress Concern Present (04/04/2022)   Received from [facility]  Tobacco Use: High Risk (10/11/2023)   Readmission Risk Interventions     No data to display

## 2023-10-16 NOTE — Progress Notes (Signed)
 [facility]                                                                               Dominic Carter, is a 68 y.o. male, DOB - 05/27/1955, FMW:[id number] Admit date - 10/11/2023    Outpatient Primary MD for the patient is Leron Millman, NP  LOS - 5  days    Brief summary   HPI: Dominic Carter is a 68 y.o. 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 Family reported patient is using alcohol and concern for other substance use as well.  Last drink of alcohol yesterday. Per chart neurology from [facility] impression was patient has alcohol withdrawal related seizure.  Patient was admitted for recurrent seizures probably secondary to alcohol withdrawal   Assessment & Plan    Assessment and Plan: * Seizure due to alcohol withdrawal (HCC)/acute metabolic encephalopathy secondary to alcohol withdrawals and seizures EEG showed cortical dysfunction of the right temporal region secondary to underlying stroke Continue with oral Keppra  Continue with CIWA, restraints are off. We will monitor him off restraints.  He has required only 2 mg of ativan  so far.     History of CVA (cerebrovascular accident) Continue with aspirin  Hyperlipidemia Continue with Pravachol   Essential hypertension BP parameters are sub optimal this afternoon, cozaar  held.  Recheck BP parameters have improved. He is asymptomatic.    Hx of BKA, right [facility]) Therapy evaluations recommending SNF. Once patient is off the restraints will plan for short-term SNF on discharge   Mild hyponatremia Sodium is down to 131 . Continue to monitor.  Tsh wnl,  Serum osmo is 293, cortisol is 3.7 , urine sodium 48, urine osmo is 457.    Mild thrombocytopenia 147,000. Monitor.       Estimated body mass index is 18.39 kg/m as calculated from the following:   Height as of this encounter: 5' 11 (1.803 m).   Weight as of this encounter: 59.8 kg.  Code Status: Full code DVT Prophylaxis:  enoxaparin  (LOVENOX ) injection 40 mg Start: 10/12/23 1000 SCDs Start: 10/11/23 2223 Place TED hose Start: 10/11/23 2223   Level of Care: Level of care: Progressive Family Communication: None at bedside  Disposition Plan:     Remains inpatient appropriate: possible d/c on Monday.   Procedures:  None  Consultants:   None  Antimicrobials:   Anti-infectives (From admission, onward)    None        Medications  Scheduled Meds:  aspirin  EC  81 mg Oral Daily   enoxaparin  (LOVENOX ) injection  40 mg Subcutaneous Q24H   folic acid   1 mg Oral Daily   levETIRAcetam   500 mg Oral BID   multivitamin with minerals  1 tablet Oral Daily   nicotine   14 mg Transdermal Daily   pravastatin   20 mg Oral Daily   sodium chloride  flush  3 mL Intravenous Q12H   sodium chloride  flush  3 mL Intravenous Q12H   thiamine   100 mg Oral Daily  Or   thiamine   100 mg Intravenous Daily   Continuous Infusions: PRN Meds:.acetaminophen  **OR** acetaminophen , hydrALAZINE , LORazepam  **OR** LORazepam , LORazepam , ondansetron  **OR** ondansetron  (ZOFRAN ) IV, prochlorperazine , sodium chloride  flush, sodium chloride  flush    Subjective:   Dominic Carter was seen and examined today.  He appears comfortable. No new complaints.   Objective:   Vitals:   10/15/23 2305 10/16/23 0454 10/16/23 0800 10/16/23 1154  BP: 124/79 124/78 98/68 124/73  Pulse: 90 94 89 (!) 102  Resp:  18 16 18   Temp:  98 F (36.7 C) 97.8 F (36.6 C) 98.1 F (36.7 C)  TempSrc:  Oral Oral Oral  SpO2:  100% 100% 98%  Weight:      Height:        Intake/Output Summary (Last 24 hours) at 10/16/2023 1555 Last data filed at 10/16/2023 1400 Gross per 24 hour  Intake 1446 ml  Output 2550 ml  Net -1104 ml   Filed Weights    10/11/23 1534 10/12/23 0211  Weight: 65.8 kg 59.8 kg     Exam General exam: Appears calm and comfortable  Respiratory system: Clear to auscultation. Respiratory effort normal. Cardiovascular system: S1 & S2 heard, RRR. No JVD,  Gastrointestinal system: Abdomen is nondistended, soft and nontender. Central nervous system: Alert and oriented to person and place.  Extremities: no edema.  Skin: No rashes,  Psychiatry: Mood & affect appropriate.      Data Reviewed:  I have personally reviewed following labs and imaging studies   CBC Lab Results  Component Value Date   WBC 7.7 10/12/2023   RBC 4.26 10/12/2023   HGB 12.2 (L) 10/12/2023   HCT 37.6 (L) 10/12/2023   MCV 88.3 10/12/2023   MCH 28.6 10/12/2023   PLT 147 (L) 10/12/2023   MCHC 32.4 10/12/2023   RDW 18.6 (H) 10/12/2023   LYMPHSABS 1.5 07/13/2023   MONOABS 1.1 (H) 07/13/2023   EOSABS 0.0 07/13/2023   BASOSABS 0.1 07/13/2023     Last metabolic panel Lab Results  Component Value Date   NA 131 (L) 10/15/2023   K 3.8 10/15/2023   CL 100 10/15/2023   CO2 21 (L) 10/15/2023   BUN 9 10/15/2023   CREATININE 0.75 10/15/2023   GLUCOSE 93 10/15/2023   GFRNONAA >60 10/15/2023   GFRAA 90 02/03/2020   CALCIUM  8.5 (L) 10/15/2023   PHOS 4.5 09/06/2021   PROT 7.4 10/12/2023   ALBUMIN 3.0 (L) 10/12/2023   BILITOT 1.3 (H) 10/12/2023   ALKPHOS 75 10/12/2023   AST 38 10/12/2023   ALT 24 10/12/2023   ANIONGAP 10 10/15/2023    CBG (last 3)  No results for input(s): GLUCAP in the last 72 hours.     Coagulation Profile: No results for input(s): INR, PROTIME in the last 168 hours.   Radiology Studies: No results found.     Elgie Butter M.D. [facility] 10/16/2023, 3:55 PM  Available via Epic secure chat 7am-7pm After 7 pm, please refer to night coverage provider listed on amion.

## 2023-10-17 DIAGNOSIS — I10 Essential (primary) hypertension: Secondary | ICD-10-CM | POA: Diagnosis not present

## 2023-10-17 DIAGNOSIS — R569 Unspecified convulsions: Secondary | ICD-10-CM | POA: Diagnosis not present

## 2023-10-17 DIAGNOSIS — F10930 Alcohol use, unspecified with withdrawal, uncomplicated: Secondary | ICD-10-CM | POA: Diagnosis not present

## 2023-10-17 DIAGNOSIS — E785 Hyperlipidemia, unspecified: Secondary | ICD-10-CM | POA: Diagnosis not present

## 2023-10-17 LAB — CBC WITH DIFFERENTIAL/PLATELET
Abs Immature Granulocytes: 0.01 K/uL (ref 0.00–0.07)
Basophils Absolute: 0.1 K/uL (ref 0.0–0.1)
Basophils Relative: 1 %
Eosinophils Absolute: 0.1 K/uL (ref 0.0–0.5)
Eosinophils Relative: 2 %
HCT: 37.7 % — ABNORMAL LOW (ref 39.0–52.0)
Hemoglobin: 11.8 g/dL — ABNORMAL LOW (ref 13.0–17.0)
Immature Granulocytes: 0 %
Lymphocytes Relative: 23 %
Lymphs Abs: 1.6 K/uL (ref 0.7–4.0)
MCH: 28.6 pg (ref 26.0–34.0)
MCHC: 31.3 g/dL (ref 30.0–36.0)
MCV: 91.3 fL (ref 80.0–100.0)
Monocytes Absolute: 1.3 K/uL — ABNORMAL HIGH (ref 0.1–1.0)
Monocytes Relative: 19 %
Neutro Abs: 3.8 K/uL (ref 1.7–7.7)
Neutrophils Relative %: 55 %
Platelets: 245 K/uL (ref 150–400)
RBC: 4.13 MIL/uL — ABNORMAL LOW (ref 4.22–5.81)
RDW: 18.2 % — ABNORMAL HIGH (ref 11.5–15.5)
WBC: 6.8 K/uL (ref 4.0–10.5)
nRBC: 0 % (ref 0.0–0.2)

## 2023-10-17 LAB — BASIC METABOLIC PANEL WITH GFR
Anion gap: 11 (ref 5–15)
BUN: 12 mg/dL (ref 8–23)
CO2: 22 mmol/L (ref 22–32)
Calcium: 9 mg/dL (ref 8.9–10.3)
Chloride: 101 mmol/L (ref 98–111)
Creatinine, Ser: 0.89 mg/dL (ref 0.61–1.24)
GFR, Estimated: >60 mL/min
Glucose, Bld: 89 mg/dL (ref 70–99)
Potassium: 4 mmol/L (ref 3.5–5.1)
Sodium: 134 mmol/L — ABNORMAL LOW (ref 135–145)

## 2023-10-17 MED ORDER — HYDROXYZINE HCL 10 MG PO TABS: 10.0000 mg | ORAL_TABLET | Freq: Three times a day (TID) | ORAL | 0 refills | Status: AC | PRN

## 2023-10-17 MED ORDER — HYDROXYZINE HCL 25 MG PO TABS
25.0000 mg | ORAL_TABLET | Freq: Four times a day (QID) | ORAL | Status: DC | PRN
  Administered 2023-10-17: 25 mg via ORAL
  Filled 2023-10-17: qty 1

## 2023-10-17 MED ORDER — NICOTINE 14 MG/24HR TD PT24
14.0000 mg | MEDICATED_PATCH | Freq: Every day | TRANSDERMAL | 0 refills | Status: DC
Start: 2023-10-18 — End: 2023-11-24

## 2023-10-17 MED ORDER — LEVETIRACETAM 500 MG PO TABS
500.0000 mg | ORAL_TABLET | Freq: Two times a day (BID) | ORAL | 0 refills | Status: AC
Start: 2023-10-17 — End: ?

## 2023-10-17 MED ORDER — FOLIC ACID 1 MG PO TABS: 1.0000 mg | ORAL_TABLET | Freq: Every day | ORAL | 1 refills | Status: AC

## 2023-10-17 MED ORDER — VITAMIN B-1 100 MG PO TABS: 100.0000 mg | ORAL_TABLET | Freq: Every day | ORAL | 1 refills | Status: AC

## 2023-10-17 MED ORDER — HYDROXYZINE HCL 25 MG PO TABS
25.0000 mg | ORAL_TABLET | Freq: Once | ORAL | Status: AC
  Administered 2023-10-17: 25 mg via ORAL
  Filled 2023-10-17: qty 1

## 2023-10-17 MED ORDER — ADULT MULTIVITAMIN W/MINERALS CH: 1.0000 | ORAL_TABLET | Freq: Every day | ORAL | Status: AC

## 2023-10-17 NOTE — TOC Progression Note (Signed)
 Transition of Care [facility]) - Progression Note    Patient Details  Name: Dominic Carter MRN: [id number] Date of Birth: 20-Oct-1955  Transition of [facility]) CM/SW Contact  Sonda Manuella Quill, RN Phone Number: 10/17/2023, 4:48 PM  Clinical Narrative:    [employer] for ins auth; Auth ID # [id number]; awaiting auth.   Expected Discharge Plan: Skilled Nursing Facility Barriers to Discharge: SNF Pending bed offer, Insurance Authorization  Expected Discharge Plan and Services In-house Referral: Clinical Social Work   Post Acute Care Choice: Skilled Nursing Facility Living arrangements for the past 2 months: Single Family Home Expected Discharge Date: 10/17/23               DME Arranged: N/A DME Agency: NA                   Social Determinants of Health (SDOH) Interventions SDOH Screenings   Food Insecurity: No Food Insecurity (10/12/2023)  Housing: Low Risk  (10/12/2023)  Transportation Needs: No Transportation Needs (10/12/2023)  Utilities: Not At Risk (10/12/2023)  Financial Resource Strain: Medium Risk (04/04/2022)   Received from [facility]  Physical Activity: Unknown (04/04/2022)   Received from [facility]  Social Connections: Socially Isolated (10/12/2023)  Stress: No Stress Concern Present (04/04/2022)   Received from [facility]  Tobacco Use: High Risk (10/11/2023)    Readmission Risk Interventions     No data to display

## 2023-10-17 NOTE — Plan of Care (Signed)
  Problem: Education: Goal: Knowledge of General Education information will improve Description: Including pain rating scale, medication(s)/side effects and non-pharmacologic comfort measures Outcome: Progressing   Problem: Health Behavior/Discharge Planning: Goal: Ability to manage health-related needs will improve Outcome: Progressing   Problem: Clinical Measurements: Goal: Ability to maintain clinical measurements within normal limits will improve Outcome: Progressing Goal: Will remain free from infection Outcome: Progressing Goal: Diagnostic test results will improve Outcome: Progressing   Problem: Activity: Goal: Risk for activity intolerance will decrease Outcome: Progressing   Problem: Nutrition: Goal: Adequate nutrition will be maintained Outcome: Progressing   Problem: Coping: Goal: Level of anxiety will decrease Outcome: Progressing   Problem: Elimination: Goal: Will not experience complications related to bowel motility Outcome: Progressing Goal: Will not experience complications related to urinary retention Outcome: Progressing   Problem: Pain Managment: Goal: General experience of comfort will improve and/or be controlled Outcome: Progressing   Problem: Safety: Goal: Ability to remain free from injury will improve Outcome: Progressing   Problem: Skin Integrity: Goal: Risk for impaired skin integrity will decrease Outcome: Progressing   Problem: Safety: Goal: Non-violent Restraint(s) Outcome: Progressing

## 2023-10-17 NOTE — Discharge Summary (Signed)
 Physician Discharge Summary   Patient: Dominic Carter MRN: [id number] DOB: [birth year]  Admit date:     10/11/2023  Discharge date: 10/17/23  Discharge Physician: Elgie Butter   PCP: Leron Millman, NP   Recommendations at discharge:  Please follow up with PCP in one week.  Please follow up with a repeat am cortisol levels in one week.    Discharge Diagnoses: Principal Problem:   Seizure due to alcohol withdrawal (HCC) Active Problems:   Seizure (HCC)   Alcohol withdrawal (HCC)   Altered mental status-secondary to seizure and alcohol withdrawal   Essential hypertension   Hyperlipidemia   History of CVA (cerebrovascular accident)   Hx of BKA, right (HCC)  Resolved Problems:   * No resolved hospital problems. *  Hospital Course:   Dominic Carter is a 68 y.o. 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 Family reported patient is using alcohol and concern for other substance use as well.  Last drink of alcohol yesterday. Per chart neurology from [facility] impression was patient has alcohol withdrawal related seizure.   Patient was admitted for recurrent seizures probably secondary to alcohol withdrawal.    Assessment and Plan: * Seizure due to alcohol withdrawal (HCC)/acute metabolic encephalopathy secondary to alcohol withdrawals and seizures EEG showed cortical dysfunction of the right temporal region secondary to underlying stroke Continue with oral Keppra  No signs of agitation today.  Added atarax  for anxiety.        History of CVA (cerebrovascular accident) Continue with aspirin  Hyperlipidemia Continue with Pravachol    Essential hypertension BP parameters are sub optimal this afternoon, cozaar  held.  Recheck BP parameters have improved. He is  asymptomatic.      Hx of BKA, right [facility]) Therapy evaluations recommending SNF. Once patient is off the restraints will plan for short-term SNF on discharge     Mild hyponatremia Sodium is down to 131 improved to 134 with IV fluids.  Tsh wnl,  Serum osmo is 293, cortisol is 3.7 , urine sodium 48, urine osmo is 457.      Mild thrombocytopenia 147,000. Monitor.          Estimated body mass index is 18.39 kg/m as calculated from the following:   Height as of this encounter: 5' 11 (1.803 m).   Weight as of this encounter: 59.8 kg.  Consultants: none.  Procedures performed: none.   Disposition: Skilled nursing facility Diet recommendation:  Discharge Diet Orders (From admission, onward)     Start     Ordered   10/17/23 0000  Diet - low sodium heart healthy        10/17/23 1157           Regular diet DISCHARGE MEDICATION: Allergies as of 10/17/2023       Reactions   Sulfa  Antibiotics Rash   Castor Oil Nausea And Vomiting   Other Nausea And Vomiting, Other (See Comments)   Boiled Fat back meat  - can't chew         Medication List     STOP taking these medications    losartan  25 MG tablet Commonly known as: COZAAR        TAKE these medications    aspirin  EC 81 MG tablet Take 81 mg by mouth daily.   famotidine  40 MG tablet Commonly known  as: PEPCID  Take 40 mg by mouth daily.   folic acid  1 MG tablet Commonly known as: FOLVITE  Take 1 tablet (1 mg total) by mouth daily. Start taking on: October 18, 2023   hydrOXYzine  10 MG tablet Commonly known as: ATARAX  Take 1 tablet (10 mg total) by mouth 3 (three) times daily as needed.   levETIRAcetam  500 MG tablet Commonly known as: KEPPRA  Take 1 tablet (500 mg total) by mouth 2 (two) times daily.   multivitamin with minerals Tabs tablet Take 1 tablet by mouth daily. Start taking on: October 18, 2023   nicotine  14 mg/24hr patch Commonly known as: NICODERM CQ  - dosed in mg/24 hours Place 1 patch (14 mg  total) onto the skin daily. Start taking on: October 18, 2023   pravastatin  20 MG tablet Commonly known as: PRAVACHOL  Take 1 tablet (20 mg total) by mouth daily.   thiamine  100 MG tablet Commonly known as: Vitamin B-1 Take 1 tablet (100 mg total) by mouth daily. Start taking on: October 18, 2023        Contact information for after-discharge care     Destination     [facility] .   Service: Skilled Nursing Contact information: [address] [place]  [address] [phone]                    Discharge Exam: Dominic Carter   10/11/23 1534 10/12/23 0211  Weight: 65.8 kg 59.8 kg   General exam: Appears calm and comfortable  Respiratory system: Clear to auscultation. Respiratory effort normal. Cardiovascular system: S1 & S2 heard, RRR. No JVD, Gastrointestinal system: Abdomen is nondistended, soft and nontender.  Central nervous system: Alert and oriented. No focal neurological deficits. Extremities: right BKA Skin: No rashes,  Psychiatry: Mood & affect appropriate.    Condition at discharge: fair  The results of significant diagnostics from this hospitalization (including imaging, microbiology, ancillary and laboratory) are listed below for reference.   Imaging Studies: EEG adult Result Date: 10/12/2023 Shelton Arlin KIDD, MD     10/12/2023 12:40 PM Patient Name: Dominic Carter MRN: [id number] Epilepsy Attending: Arlin KIDD Shelton Referring Physician/Provider: Sundil, Subrina, MD Date: 10/12/2023 Duration: 24.31 mins Patient history:  68 y.o. male with PMH significant for previous right posterior MCA cortical infarct (apparently unknown to patient), EtOh use who presents with two seizures. EEG to evaluate for seizure Level of alertness: Awake,  asleep AEDs during EEG study: None Technical aspects: This EEG study was done with scalp electrodes positioned according to the 10-20 International system of electrode placement. Electrical activity was  reviewed with band pass filter of 1-70Hz , sensitivity of 7 uV/mm, display speed of 18mm/sec with a 60Hz  notched filter applied as appropriate. EEG data were recorded continuously and digitally stored.  Video monitoring was available and reviewed as appropriate. Description: The posterior dominant rhythm consists of 8-9 Hz activity of moderate voltage (25-35 uV) seen predominantly in posterior head regions, symmetric and reactive to eye opening and eye closing. Sleep was characterized by vertex waves, sleep spindles (12 to 14 Hz), maximal frontocentral region. EEG showed intermittent generalized  3 to 6 Hz theta-delta slowing in right temporal region. Hyperventilation and photic stimulation were not performed.    ABNORMALITY - Intermittent slow, right temporal region  IMPRESSION: This study is suggestive of cortical dysfunction in right temporal region likely secondary to underlying stroke.  No seizures or epileptiform discharges were seen throughout the recording.  Priyanka O Yadav    CT HEAD WO  CONTRAST ( ) Result Date: 10/12/2023 EXAM: CT HEAD WITHOUT CONTRAST 10/12/2023 05:46:35 AM TECHNIQUE: CT of the head was performed without the administration of intravenous contrast. Automated exposure control, iterative reconstruction, and/or weight based adjustment of the mA/kV was utilized to reduce the radiation dose to as low as reasonably achievable. COMPARISON: None available. CLINICAL HISTORY: Fall - unwitnessed. FINDINGS: BRAIN AND VENTRICLES: No acute intracranial abnormalities present. Chronic right parietal occipital encephalomalacia is stable. Ex vacuo dilation of the right lateral ventricle is noted. Moderate white matter changes are stable. A remote small lacunar infarct is present in the left PICA (posterior inferior cerebellar artery) territory. ORBITS: No acute abnormality. SINUSES: A fluid level was present within the left sphenoid sinus and a posterior left ethmoid air cell. Minimal fluid is present  in the right sphenoid sinus. The paranasal sinuses are otherwise clear. SOFT TISSUES AND SKULL: No acute soft tissue abnormality. No skull fracture. IMPRESSION: 1. No acute intracranial abnormality related to the unwitnessed fall. 2. Stable chronic right parietal occipital encephalomalacia with ex vacuo dilation of the right lateral ventricle and moderate white matter changes. 3. Rule out small lacunar infarct in the left PICA territory. 4. Fluid level in the left sphenoid sinus and posterior left ethmoid air cell, with minimal fluid in the right sphenoid sinus. Electronically signed by: Lonni Necessary MD 10/12/2023 06:29 AM EDT RP Workstation: [id number]   CT HEAD WO CONTRAST ( ) Result Date: 10/11/2023 CLINICAL DATA:  Seizure disorder, clinical change EXAM: CT HEAD WITHOUT CONTRAST TECHNIQUE: Contiguous axial images were obtained from the base of the skull through the vertex without intravenous contrast. RADIATION DOSE REDUCTION: This exam was performed according to the departmental dose-optimization program which includes automated exposure control, adjustment of the mA and/or kV according to patient size and/or use of iterative reconstruction technique. COMPARISON:  07/13/2023 FINDINGS: Brain: Area of encephalomalacia within the posterior right temporal and parietal lobes again noted, unchanged. There is atrophy and chronic small vessel disease changes. No acute intracranial abnormality. Specifically, no hemorrhage, hydrocephalus, mass lesion, acute infarction, or significant intracranial injury. Vascular: No hyperdense vessel or unexpected calcification. Skull: No acute calvarial abnormality. Sinuses/Orbits: No acute findings Other: None IMPRESSION: Atrophy, chronic microvascular disease. No acute intracranial abnormality. Stable right temporoparietal encephalomalacia. Electronically Signed   By: Franky Crease M.D.   On: 10/11/2023 21:25   DG Chest Portable 1 View Result Date: 10/11/2023 CLINICAL  DATA:  ams EXAM: PORTABLE CHEST 1 VIEW COMPARISON:  None Available. FINDINGS: The heart and mediastinal contours are within normal limits. No focal consolidation. No pulmonary edema. No pleural effusion. No pneumothorax. No acute osseous abnormality. IMPRESSION: No active disease. Electronically Signed   By: Morgane  Naveau M.D.   On: 10/11/2023 21:12    Microbiology: Results for orders placed or performed during the hospital encounter of 08/03/23  Aerobic/Anaerobic Culture w Gram Stain (surgical/deep wound)     Status: None   Collection Time: 08/03/23  8:00 AM   Specimen: Path fluid; Body Fluid  Result Value Ref Range Status   Specimen Description FLUID  Final   Special Requests NONE  Final   Gram Stain   Final    FEW WBC PRESENT, PREDOMINANTLY PMN RARE GRAM POSITIVE COCCI IN PAIRS    Culture   Final    RARE PROTEUS MIRABILIS FEW STREPTOCOCCUS GROUP C Beta hemolytic streptococci are predictably susceptible to penicillin and other beta lactams. Susceptibility testing not routinely performed. FEW BACTEROIDES THETAIOTAOMICRON BETA LACTAMASE POSITIVE Performed at [facility], [address] [place], [place] [address]    Report Status 08/06/2023 FINAL  Final   Organism ID, Bacteria PROTEUS MIRABILIS  Final      Susceptibility   Proteus mirabilis - MIC*    AMPICILLIN <=2 SENSITIVE Sensitive     CEFEPIME <=0.12 SENSITIVE Sensitive     CEFTAZIDIME <=1 SENSITIVE Sensitive     CEFTRIAXONE  <=0.25 SENSITIVE Sensitive     CIPROFLOXACIN <=0.25 SENSITIVE Sensitive     GENTAMICIN <=1 SENSITIVE Sensitive     IMIPENEM 2 SENSITIVE Sensitive     TRIMETH /SULFA  <=20 SENSITIVE Sensitive     AMPICILLIN/SULBACTAM <=2 SENSITIVE Sensitive     PIP/TAZO <=4 SENSITIVE Sensitive ug/mL    * RARE PROTEUS MIRABILIS    Labs: CBC: Recent Labs  Lab 10/11/23 1603 10/12/23 1349 10/17/23 0544  WBC 6.5 7.7 6.8  NEUTROABS  --   --  3.8  HGB 11.1* 12.2* 11.8*  HCT 34.5* 37.6* 37.7*  MCV 88.9 88.3  91.3  PLT 156 147* 245   Basic Metabolic Panel: Recent Labs  Lab 10/11/23 1603 10/12/23 1349 10/15/23 0510 10/17/23 0544  NA 138 133* 131* 134*  K 3.8 3.5 3.8 4.0  CL 104 99 100 101  CO2 23 23 21* 22  GLUCOSE 103* 91 93 89  BUN 9 6* 9 12  CREATININE 0.70 0.75 0.75 0.89  CALCIUM  8.3* 8.3* 8.5* 9.0   Liver Function Tests: Recent Labs  Lab 10/11/23 1603 10/12/23 1349  AST 41 38  ALT 26 24  ALKPHOS 83 75  BILITOT 1.0 1.3*  PROT 7.6 7.4  ALBUMIN 3.2* 3.0*   CBG: Recent Labs  Lab 10/11/23 2049  GLUCAP 125*    Discharge time spent: 39 MINUTES.   Signed: Elgie Butter, MD [facility] 10/17/2023

## 2023-10-17 NOTE — Plan of Care (Signed)
  Problem: Activity: Goal: Risk for activity intolerance will decrease Outcome: Progressing   Problem: Nutrition: Goal: Adequate nutrition will be maintained Outcome: Progressing   Problem: Clinical Measurements: Goal: Will remain free from infection Outcome: Progressing   Problem: Clinical Measurements: Goal: Diagnostic test results will improve Outcome: Progressing

## 2023-10-18 ENCOUNTER — Inpatient Hospital Stay (HOSPITAL_COMMUNITY)

## 2023-10-18 DIAGNOSIS — Z8673 Personal history of transient ischemic attack (TIA), and cerebral infarction without residual deficits: Secondary | ICD-10-CM | POA: Diagnosis not present

## 2023-10-18 DIAGNOSIS — E785 Hyperlipidemia, unspecified: Secondary | ICD-10-CM | POA: Diagnosis not present

## 2023-10-18 DIAGNOSIS — F10930 Alcohol use, unspecified with withdrawal, uncomplicated: Secondary | ICD-10-CM | POA: Diagnosis not present

## 2023-10-18 DIAGNOSIS — R569 Unspecified convulsions: Secondary | ICD-10-CM | POA: Diagnosis not present

## 2023-10-18 LAB — URINALYSIS, ROUTINE W REFLEX MICROSCOPIC
Bilirubin Urine: NEGATIVE
Glucose, UA: NEGATIVE mg/dL
Hgb urine dipstick: NEGATIVE
Ketones, ur: NEGATIVE mg/dL
Leukocytes,Ua: NEGATIVE
Nitrite: NEGATIVE
Protein, ur: NEGATIVE mg/dL
Specific Gravity, Urine: 1.008 (ref 1.005–1.030)
pH: 7 (ref 5.0–8.0)

## 2023-10-18 LAB — CBC WITH DIFFERENTIAL/PLATELET
Abs Immature Granulocytes: 0.02 K/uL (ref 0.00–0.07)
Basophils Absolute: 0.1 K/uL (ref 0.0–0.1)
Basophils Relative: 1 %
Eosinophils Absolute: 0.1 K/uL (ref 0.0–0.5)
Eosinophils Relative: 2 %
HCT: 41 % (ref 39.0–52.0)
Hemoglobin: 12.6 g/dL — ABNORMAL LOW (ref 13.0–17.0)
Immature Granulocytes: 0 %
Lymphocytes Relative: 21 %
Lymphs Abs: 1.4 K/uL (ref 0.7–4.0)
MCH: 28.4 pg (ref 26.0–34.0)
MCHC: 30.7 g/dL (ref 30.0–36.0)
MCV: 92.3 fL (ref 80.0–100.0)
Monocytes Absolute: 1.5 K/uL — ABNORMAL HIGH (ref 0.1–1.0)
Monocytes Relative: 22 %
Neutro Abs: 3.5 K/uL (ref 1.7–7.7)
Neutrophils Relative %: 54 %
Platelets: 262 K/uL (ref 150–400)
RBC: 4.44 MIL/uL (ref 4.22–5.81)
RDW: 18 % — ABNORMAL HIGH (ref 11.5–15.5)
WBC: 6.6 K/uL (ref 4.0–10.5)
nRBC: 0 % (ref 0.0–0.2)

## 2023-10-18 NOTE — Plan of Care (Signed)
  Problem: Coping: Goal: Level of anxiety will decrease Outcome: Not Progressing   

## 2023-10-18 NOTE — Progress Notes (Signed)
 Mobility Specialist - Progress Note   10/18/23 1453  Mobility  Activity Ambulated with assistance to bathroom;Ambulated with assistance in hallway  Level of Assistance Contact guard assist, steadying assist  Assistive Device Front wheel walker  Distance Ambulated (ft) 500 ft  Range of Motion/Exercises Active  Activity Response Tolerated well  Mobility Referral Yes  Mobility visit 1 Mobility  Mobility Specialist Start Time (ACUTE ONLY) 1430  Mobility Specialist Stop Time (ACUTE ONLY) 1453  Mobility Specialist Time Calculation (min) (ACUTE ONLY) 23 min   Pt was found on recliner chair and agreeable to ambulate. No complaints with session. At EOS returned to bed with all needs met. Call bell in reach.  Erminio Leos,  Mobility Specialist Can be reached via Secure Chat

## 2023-10-18 NOTE — Progress Notes (Signed)
 [facility]                                                                               Dominic Carter, is a 68 y.o. male, DOB - 26-Aug-1955, FMW:[id number] Admit date - 10/11/2023    Outpatient Primary MD for the patient is Leron Millman, NP  LOS - 7  days    Brief summary   HPI: Dominic Carter is a 68 y.o. 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 Family reported patient is using alcohol and concern for other substance use as well.  Last drink of alcohol yesterday. Per chart neurology from [facility] impression was patient has alcohol withdrawal related seizure.  Patient was admitted for recurrent seizures probably secondary to alcohol withdrawal   Assessment & Plan    Assessment and Plan: * Seizure due to alcohol withdrawal (HCC)/acute metabolic encephalopathy secondary to alcohol withdrawals and seizures EEG showed cortical dysfunction of the right temporal region secondary to underlying stroke Continue with oral Keppra  Off all restraints . Waiting for SNF.     History of CVA (cerebrovascular accident) Continue with aspirin  Hyperlipidemia Continue with Pravachol   Essential hypertension Optimal.    Hx of BKA, right (HCC) Therapy evaluations recommending SNF. Once patient is off the restraints will plan for short-term SNF on discharge   Mild hyponatremia Sodium has improved to 134.  Tsh wnl,  Serum osmo is 293, cortisol is 3.7 , urine sodium 48, urine osmo is 457.    Mild thrombocytopenia Resolved.   Low grade fever of unclear etiology UA AND cxr are negative.  Wbc count wnl.  Monitor.    Estimated body mass index is 18.39 kg/m as calculated from the following:   Height as of this encounter: 5' 11 (1.803 m).   Weight  as of this encounter: 59.8 kg.  Code Status: Full code DVT Prophylaxis:  enoxaparin  (LOVENOX ) injection 40 mg Start: 10/12/23 1000 SCDs Start: 10/11/23 2223 Place TED hose Start: 10/11/23 2223   Level of Care: Level of care: Progressive Family Communication: None at bedside  Disposition Plan:     Remains inpatient appropriate: possible d/c on Monday.   Procedures:  None  Consultants:   None  Antimicrobials:   Anti-infectives (From admission, onward)    None        Medications  Scheduled Meds:  aspirin  EC  81 mg Oral Daily   enoxaparin  (LOVENOX ) injection  40 mg Subcutaneous Q24H   folic acid   1 mg Oral Daily   levETIRAcetam   500 mg Oral BID   multivitamin with minerals  1 tablet Oral Daily   nicotine   14 mg Transdermal Daily   pravastatin   20 mg Oral Daily   sodium chloride  flush  3 mL Intravenous Q12H   sodium chloride  flush  3 mL Intravenous Q12H   thiamine   100 mg Oral Daily   Or   thiamine   100 mg Intravenous Daily   Continuous Infusions: PRN Meds:.acetaminophen  **OR** acetaminophen , hydrALAZINE , hydrOXYzine , LORazepam , ondansetron  **  OR** ondansetron  (ZOFRAN ) IV, prochlorperazine , sodium chloride  flush, sodium chloride  flush    Subjective:   Dominic Carter was seen and examined today. No new complaints.   Objective:   Vitals:   10/17/23 1202 10/17/23 2025 10/18/23 0406 10/18/23 1314  BP: 122/76 119/84 122/69 113/80  Pulse: 91 94 70 88  Resp: 18   17  Temp: 97.8 F (36.6 C) (!) 100.4 F (38 C) 98.1 F (36.7 C) (!) 97.4 F (36.3 C)  TempSrc: Oral Oral Oral Oral  SpO2: 100% 100% 100% 100%  Weight:      Height:        Intake/Output Summary (Last 24 hours) at 10/18/2023 1800 Last data filed at 10/18/2023 1318 Gross per 24 hour  Intake 600 ml  Output 1325 ml  Net -725 ml   Filed Weights   10/11/23 1534 10/12/23 0211  Weight: 65.8 kg 59.8 kg     Exam General exam: Appears calm and comfortable  Respiratory system: Clear to  auscultation. Respiratory effort normal. Cardiovascular system: S1 & S2 heard, RRR. No JVD,  Gastrointestinal system: Abdomen is nondistended, soft and nontender.  Central nervous system: Alert and oriented. No focal neurological deficits. Extremities: Symmetric 5 x 5 power. Skin: No rashes,  Psychiatry:  Mood & affect appropriate.       Data Reviewed:  I have personally reviewed following labs and imaging studies   CBC Lab Results  Component Value Date   WBC 6.6 10/18/2023   RBC 4.44 10/18/2023   HGB 12.6 (L) 10/18/2023   HCT 41.0 10/18/2023   MCV 92.3 10/18/2023   MCH 28.4 10/18/2023   PLT 262 10/18/2023   MCHC 30.7 10/18/2023   RDW 18.0 (H) 10/18/2023   LYMPHSABS 1.4 10/18/2023   MONOABS 1.5 (H) 10/18/2023   EOSABS 0.1 10/18/2023   BASOSABS 0.1 10/18/2023     Last metabolic panel Lab Results  Component Value Date   NA 134 (L) 10/17/2023   K 4.0 10/17/2023   CL 101 10/17/2023   CO2 22 10/17/2023   BUN 12 10/17/2023   CREATININE 0.89 10/17/2023   GLUCOSE 89 10/17/2023   GFRNONAA >60 10/17/2023   GFRAA 90 02/03/2020   CALCIUM  9.0 10/17/2023   PHOS 4.5 09/06/2021   PROT 7.4 10/12/2023   ALBUMIN 3.0 (L) 10/12/2023   BILITOT 1.3 (H) 10/12/2023   ALKPHOS 75 10/12/2023   AST 38 10/12/2023   ALT 24 10/12/2023   ANIONGAP 11 10/17/2023    CBG (last 3)  No results for input(s): GLUCAP in the last 72 hours.     Coagulation Profile: No results for input(s): INR, PROTIME in the last 168 hours.   Radiology Studies: DG CHEST PORT 1 VIEW Result Date: 10/18/2023 CLINICAL DATA:  Fever EXAM: PORTABLE CHEST 1 VIEW COMPARISON:  October 11, 2023 FINDINGS: Both lungs are clear. Cardiomediastinal silhouette is unchanged. The visualized skeletal structures are unremarkable. IMPRESSION: No active disease. Electronically Signed   By: Michaeline Blanch M.D.   On: 10/18/2023 15:50       Elgie Butter M.D. [facility] 10/18/2023, 6:00 PM  Available via Epic secure  chat 7am-7pm After 7 pm, please refer to night coverage provider listed on amion.

## 2023-10-18 NOTE — TOC Progression Note (Signed)
 Transition of Care [facility]) - Progression Note    Patient Details  Name: Dominic Carter MRN: [id number] Date of Birth: 10/29/1955  [facility]) CM/SW Contact  Sonda Manuella Quill, RN Phone Number: 10/18/2023, 5:13 PM  Clinical Narrative:    Received Plan Auth ID # [id number], Auth ID # [id number], status pending; spoke w/ Rep Harlene at [facility]; she says auth still pending but sent for physician review; left main TOC # for updates; will pass on to oncoming TOC for follow up tomorrow.   Expected Discharge Plan: Skilled Nursing Facility Barriers to Discharge: SNF Pending bed offer, Insurance Authorization  Expected Discharge Plan and Services In-house Referral: Clinical Social Work   Post Acute Care Choice: Skilled Nursing Facility Living arrangements for the past 2 months: Single Family Home Expected Discharge Date: 10/17/23               DME Arranged: N/A DME Agency: NA                   Social Determinants of Health (SDOH) Interventions SDOH Screenings   Food Insecurity: No Food Insecurity (10/12/2023)  Housing: Low Risk  (10/12/2023)  Transportation Needs: No Transportation Needs (10/12/2023)  Utilities: Not At Risk (10/12/2023)  Financial Resource Strain: Medium Risk (04/04/2022)   Received from [facility]  Physical Activity: Unknown (04/04/2022)   Received from [facility]  Social Connections: Socially Isolated (10/12/2023)  Stress: No Stress Concern Present (04/04/2022)   Received from [facility]  Tobacco Use: High Risk (10/11/2023)    Readmission Risk Interventions     No data to display

## 2023-10-18 NOTE — Plan of Care (Signed)
  Problem: Education: Goal: Knowledge of General Education information will improve Description: Including pain rating scale, medication(s)/side effects and non-pharmacologic comfort measures Outcome: Progressing   Problem: Health Behavior/Discharge Planning: Goal: Ability to manage health-related needs will improve Outcome: Progressing   Problem: Clinical Measurements: Goal: Ability to maintain clinical measurements within normal limits will improve Outcome: Progressing Goal: Will remain free from infection Outcome: Progressing Goal: Diagnostic test results will improve Outcome: Progressing   Problem: Activity: Goal: Risk for activity intolerance will decrease Outcome: Progressing   Problem: Nutrition: Goal: Adequate nutrition will be maintained Outcome: Progressing   Problem: Coping: Goal: Level of anxiety will decrease Outcome: Progressing   Problem: Elimination: Goal: Will not experience complications related to bowel motility Outcome: Progressing Goal: Will not experience complications related to urinary retention Outcome: Progressing   Problem: Pain Managment: Goal: General experience of comfort will improve and/or be controlled Outcome: Progressing   Problem: Safety: Goal: Ability to remain free from injury will improve Outcome: Progressing   Problem: Skin Integrity: Goal: Risk for impaired skin integrity will decrease Outcome: Progressing   Problem: Safety: Goal: Non-violent Restraint(s) Outcome: Progressing

## 2023-10-19 DIAGNOSIS — E785 Hyperlipidemia, unspecified: Secondary | ICD-10-CM | POA: Diagnosis not present

## 2023-10-19 DIAGNOSIS — F10930 Alcohol use, unspecified with withdrawal, uncomplicated: Secondary | ICD-10-CM | POA: Diagnosis not present

## 2023-10-19 DIAGNOSIS — I10 Essential (primary) hypertension: Secondary | ICD-10-CM | POA: Diagnosis not present

## 2023-10-19 DIAGNOSIS — R569 Unspecified convulsions: Secondary | ICD-10-CM | POA: Diagnosis not present

## 2023-10-19 NOTE — Care Management Important Message (Signed)
 Important Message  Patient Details IM Letter given. Name: Dominic Carter MRN: [id number] Date of Birth: April 05, 1956   Important Message Given:  Yes - Medicare IM     [name] 10/19/2023, 12:54 PM

## 2023-10-19 NOTE — TOC Transition Note (Signed)
 Transition of Care [facility]) - Discharge Note  Patient Details  Name: Dominic Carter MRN: [id number] Date of Birth: 10-01-1955  Transition [facility]) CM/SW Contact:  Duwaine GORMAN Aran, LCSW Phone Number: 10/19/2023, 12:40 PM  Clinical Narrative: Patient has insurance approval for SNF. Plan auth ID # is: [id number]. Reference ID # is: [id number]. Patient is approved for 10/19/2023-10/21/2023. CSW confirmed bed with [facility] in admissions. Patient will go to room 105 and the number for report is [phone]. Discharge summary, discharge orders, and SNF transfer report faxed to facility in hub. Medical necessity form done; PTAR scheduled. Discharge packet completed. Niece notified of transportation being set up. RN updated. TOC signing off.  Final next level of care: Skilled Nursing Facility Barriers to Discharge: Barriers Resolved  Patient Goals and CMS Choice Patient states their goals for this hospitalization and ongoing recovery are:: Go to rehab CMS Medicare.gov Compare Post Acute Care list provided to:: Patient Represenative (must comment) [name] (niece)) Choice offered to / list presented to :  (Niece)  Discharge Placement Existing PASRR number confirmed : 10/13/23          Patient to be transferred to facility by: PTAR Name of family member notified: [name] (niece) Patient and family notified of of transfer: 10/19/23  Discharge Plan and Services Additional resources added to the After Visit Summary for   In-house Referral: Clinical Social Work Post Acute Care Choice: Skilled Nursing Facility          DME Arranged: N/A DME Agency: NA  Social Drivers of Health (SDOH) Interventions SDOH Screenings   Food Insecurity: No Food Insecurity (10/12/2023)  Housing: Low Risk  (10/12/2023)  Transportation Needs: No Transportation Needs (10/12/2023)  Utilities: Not At Risk (10/12/2023)  Financial Resource Strain: Medium Risk (04/04/2022)   Received from [facility]  Physical  Activity: Unknown (04/04/2022)   Received from [facility]  Social Connections: Socially Isolated (10/12/2023)  Stress: No Stress Concern Present (04/04/2022)   Received from [facility]  Tobacco Use: High Risk (10/11/2023)   Readmission Risk Interventions     No data to display

## 2023-10-19 NOTE — Discharge Summary (Signed)
 Physician Discharge Summary   Patient: Dominic Carter MRN: [id number] DOB: Jun 09, 1955  Admit date:     10/11/2023  Discharge date: 10/19/23  Discharge Physician: Elgie Butter   PCP: Leron Millman, NP   Recommendations at discharge:  Please follow up with PCP in one week.  Please follow up with a repeat am cortisol levels in one week.    Discharge Diagnoses: Principal Problem:   Seizure due to alcohol withdrawal (HCC) Active Problems:   Seizure (HCC)   Alcohol withdrawal (HCC)   Altered mental status-secondary to seizure and alcohol withdrawal   Essential hypertension   Hyperlipidemia   History of CVA (cerebrovascular accident)   Hx of BKA, right (HCC)  Resolved Problems:   * No resolved hospital problems. *  Hospital Course:   Dominic Carter is a 68 y.o. 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 Family reported patient is using alcohol and concern for other substance use as well.  Last drink of alcohol yesterday. Per chart neurology from [facility] impression was patient has alcohol withdrawal related seizure.   Patient was admitted for recurrent seizures probably secondary to alcohol withdrawal.    Assessment and Plan: * Seizure due to alcohol withdrawal (HCC)/acute metabolic encephalopathy secondary to alcohol withdrawals and seizures EEG showed cortical dysfunction of the right temporal region secondary to underlying stroke Continue with oral Keppra  No signs of agitation today.  Added atarax  for anxiety.        History of CVA (cerebrovascular accident) Continue with aspirin  Hyperlipidemia Continue with Pravachol    Essential hypertension BP parameters are sub optimal this afternoon, cozaar  held.  Recheck BP parameters have improved. He is  asymptomatic.      Hx of BKA, right [facility]) Therapy evaluations recommending SNF. Once patient is off the restraints will plan for short-term SNF on discharge     Mild hyponatremia Sodium is down to 131 improved to 134 with IV fluids.  Tsh wnl,  Serum osmo is 293, cortisol is 3.7 , urine sodium 48, urine osmo is 457.      Mild thrombocytopenia 147,000. Monitor.          Estimated body mass index is 18.39 kg/m as calculated from the following:   Height as of this encounter: 5' 11 (1.803 m).   Weight as of this encounter: 59.8 kg.  Consultants: none.  Procedures performed: none.   Disposition: Skilled nursing facility Diet recommendation:  Discharge Diet Orders (From admission, onward)     Start     Ordered   10/17/23 0000  Diet - low sodium heart healthy        10/17/23 1157           Regular diet DISCHARGE MEDICATION: Allergies as of 10/19/2023       Reactions   Sulfa  Antibiotics Rash   Castor Oil Nausea And Vomiting   Other Nausea And Vomiting, Other (See Comments)   Boiled Fat back meat  - can't chew         Medication List     STOP taking these medications    losartan  25 MG tablet Commonly known as: COZAAR        TAKE these medications    aspirin  EC 81 MG tablet Take 81 mg by mouth daily.   famotidine  40 MG tablet Commonly known  as: PEPCID  Take 40 mg by mouth daily.   folic acid  1 MG tablet Commonly known as: FOLVITE  Take 1 tablet (1 mg total) by mouth daily.   hydrOXYzine  10 MG tablet Commonly known as: ATARAX  Take 1 tablet (10 mg total) by mouth 3 (three) times daily as needed.   levETIRAcetam  500 MG tablet Commonly known as: KEPPRA  Take 1 tablet (500 mg total) by mouth 2 (two) times daily.   multivitamin with minerals Tabs tablet Take 1 tablet by mouth daily.   nicotine  14 mg/24hr patch Commonly known as: NICODERM CQ  - dosed in mg/24 hours Place 1 patch (14 mg total) onto the skin daily.   pravastatin  20 MG  tablet Commonly known as: PRAVACHOL  Take 1 tablet (20 mg total) by mouth daily.   thiamine  100 MG tablet Commonly known as: Vitamin B-1 Take 1 tablet (100 mg total) by mouth daily.        Contact information for after-discharge care     Destination     [facility] .   Service: Skilled Nursing Contact information: [address] [place]  [address] [phone]                    Discharge Exam: Dominic Carter   10/11/23 1534 10/12/23 0211  Weight: 65.8 kg 59.8 kg   General exam: Appears calm and comfortable  Respiratory system: Clear to auscultation. Respiratory effort normal. Cardiovascular system: S1 & S2 heard, RRR. No JVD, Gastrointestinal system: Abdomen is nondistended, soft and nontender.  Central nervous system: Alert and oriented. No focal neurological deficits. Extremities: right BKA Skin: No rashes,  Psychiatry: Mood & affect appropriate.    Condition at discharge: fair  The results of significant diagnostics from this hospitalization (including imaging, microbiology, ancillary and laboratory) are listed below for reference.   Imaging Studies: DG CHEST PORT 1 VIEW Result Date: 10/18/2023 CLINICAL DATA:  Fever EXAM: PORTABLE CHEST 1 VIEW COMPARISON:  October 11, 2023 FINDINGS: Both lungs are clear. Cardiomediastinal silhouette is unchanged. The visualized skeletal structures are unremarkable. IMPRESSION: No active disease. Electronically Signed   By: Michaeline Blanch M.D.   On: 10/18/2023 15:50   EEG adult Result Date: 10/12/2023 Shelton Arlin KIDD, MD     10/12/2023 12:40 PM Patient Name: Dominic Carter MRN: [id number] Epilepsy Attending: Arlin KIDD Shelton Referring Physician/Provider: Sundil, Subrina, MD Date: 10/12/2023 Duration: 24.31 mins Patient history:  68 y.o. male with PMH significant for previous right posterior MCA cortical infarct (apparently unknown to patient), EtOh use who presents with two seizures. EEG to evaluate for  seizure Level of alertness: Awake,  asleep AEDs during EEG study: None Technical aspects: This EEG study was done with scalp electrodes positioned according to the 10-20 International system of electrode placement. Electrical activity was reviewed with band pass filter of 1-70Hz , sensitivity of 7 uV/mm, display speed of 93mm/sec with a 60Hz  notched filter applied as appropriate. EEG data were recorded continuously and digitally stored.  Video monitoring was available and reviewed as appropriate. Description: The posterior dominant rhythm consists of 8-9 Hz activity of moderate voltage (25-35 uV) seen predominantly in posterior head regions, symmetric and reactive to eye opening and eye closing. Sleep was characterized by vertex waves, sleep spindles (12 to 14 Hz), maximal frontocentral region. EEG showed intermittent generalized  3 to 6 Hz theta-delta slowing in right temporal region. Hyperventilation and photic stimulation were not performed.    ABNORMALITY - Intermittent slow, right temporal region  IMPRESSION: This study is suggestive  of cortical dysfunction in right temporal region likely secondary to underlying stroke.  No seizures or epileptiform discharges were seen throughout the recording.  Dominic Carter    CT HEAD WO CONTRAST ( ) Result Date: 10/12/2023 EXAM: CT HEAD WITHOUT CONTRAST 10/12/2023 05:46:35 AM TECHNIQUE: CT of the head was performed without the administration of intravenous contrast. Automated exposure control, iterative reconstruction, and/or weight based adjustment of the mA/kV was utilized to reduce the radiation dose to as low as reasonably achievable. COMPARISON: None available. CLINICAL HISTORY: Fall - unwitnessed. FINDINGS: BRAIN AND VENTRICLES: No acute intracranial abnormalities present. Chronic right parietal occipital encephalomalacia is stable. Ex vacuo dilation of the right lateral ventricle is noted. Moderate white matter changes are stable. A remote small lacunar infarct  is present in the left PICA (posterior inferior cerebellar artery) territory. ORBITS: No acute abnormality. SINUSES: A fluid level was present within the left sphenoid sinus and a posterior left ethmoid air cell. Minimal fluid is present in the right sphenoid sinus. The paranasal sinuses are otherwise clear. SOFT TISSUES AND SKULL: No acute soft tissue abnormality. No skull fracture. IMPRESSION: 1. No acute intracranial abnormality related to the unwitnessed fall. 2. Stable chronic right parietal occipital encephalomalacia with ex vacuo dilation of the right lateral ventricle and moderate white matter changes. 3. Rule out small lacunar infarct in the left PICA territory. 4. Fluid level in the left sphenoid sinus and posterior left ethmoid air cell, with minimal fluid in the right sphenoid sinus. Electronically signed by: Lonni Necessary MD 10/12/2023 06:29 AM EDT RP Workstation: [id number]   CT HEAD WO CONTRAST ( ) Result Date: 10/11/2023 CLINICAL DATA:  Seizure disorder, clinical change EXAM: CT HEAD WITHOUT CONTRAST TECHNIQUE: Contiguous axial images were obtained from the base of the skull through the vertex without intravenous contrast. RADIATION DOSE REDUCTION: This exam was performed according to the departmental dose-optimization program which includes automated exposure control, adjustment of the mA and/or kV according to patient size and/or use of iterative reconstruction technique. COMPARISON:  07/13/2023 FINDINGS: Brain: Area of encephalomalacia within the posterior right temporal and parietal lobes again noted, unchanged. There is atrophy and chronic small vessel disease changes. No acute intracranial abnormality. Specifically, no hemorrhage, hydrocephalus, mass lesion, acute infarction, or significant intracranial injury. Vascular: No hyperdense vessel or unexpected calcification. Skull: No acute calvarial abnormality. Sinuses/Orbits: No acute findings Other: None IMPRESSION: Atrophy, chronic  microvascular disease. No acute intracranial abnormality. Stable right temporoparietal encephalomalacia. Electronically Signed   By: Franky Crease M.D.   On: 10/11/2023 21:25   DG Chest Portable 1 View Result Date: 10/11/2023 CLINICAL DATA:  ams EXAM: PORTABLE CHEST 1 VIEW COMPARISON:  None Available. FINDINGS: The heart and mediastinal contours are within normal limits. No focal consolidation. No pulmonary edema. No pleural effusion. No pneumothorax. No acute osseous abnormality. IMPRESSION: No active disease. Electronically Signed   By: Morgane  Naveau M.D.   On: 10/11/2023 21:12    Microbiology: Results for orders placed or performed during the hospital encounter of 08/03/23  Aerobic/Anaerobic Culture w Gram Stain (surgical/deep wound)     Status: None   Collection Time: 08/03/23  8:00 AM   Specimen: Path fluid; Body Fluid  Result Value Ref Range Status   Specimen Description FLUID  Final   Special Requests NONE  Final   Gram Stain   Final    FEW WBC PRESENT, PREDOMINANTLY PMN RARE GRAM POSITIVE COCCI IN PAIRS    Culture   Final    RARE PROTEUS MIRABILIS FEW STREPTOCOCCUS GROUP  C Beta hemolytic streptococci are predictably susceptible to penicillin and other beta lactams. Susceptibility testing not routinely performed. FEW BACTEROIDES THETAIOTAOMICRON BETA LACTAMASE POSITIVE Performed at [facility], [address], [place], [place] [address]    Report Status 08/06/2023 FINAL  Final   Organism ID, Bacteria PROTEUS MIRABILIS  Final      Susceptibility   Proteus mirabilis - MIC*    AMPICILLIN <=2 SENSITIVE Sensitive     CEFEPIME <=0.12 SENSITIVE Sensitive     CEFTAZIDIME <=1 SENSITIVE Sensitive     CEFTRIAXONE  <=0.25 SENSITIVE Sensitive     CIPROFLOXACIN <=0.25 SENSITIVE Sensitive     GENTAMICIN <=1 SENSITIVE Sensitive     IMIPENEM 2 SENSITIVE Sensitive     TRIMETH /SULFA  <=20 SENSITIVE Sensitive     AMPICILLIN/SULBACTAM <=2 SENSITIVE Sensitive     PIP/TAZO <=4 SENSITIVE  Sensitive ug/mL    * RARE PROTEUS MIRABILIS    Labs: CBC: Recent Labs  Lab 10/12/23 1349 10/17/23 0544 10/18/23 1433  WBC 7.7 6.8 6.6  NEUTROABS  --  3.8 3.5  HGB 12.2* 11.8* 12.6*  HCT 37.6* 37.7* 41.0  MCV 88.3 91.3 92.3  PLT 147* 245 262   Basic Metabolic Panel: Recent Labs  Lab 10/12/23 1349 10/15/23 0510 10/17/23 0544  NA 133* 131* 134*  K 3.5 3.8 4.0  CL 99 100 101  CO2 23 21* 22  GLUCOSE 91 93 89  BUN 6* 9 12  CREATININE 0.75 0.75 0.89  CALCIUM  8.3* 8.5* 9.0   Liver Function Tests: Recent Labs  Lab 10/12/23 1349  AST 38  ALT 24  ALKPHOS 75  BILITOT 1.3*  PROT 7.4  ALBUMIN 3.0*   CBG: No results for input(s): GLUCAP in the last 168 hours.   Discharge time spent: 39 MINUTES.   Signed: Elgie Butter, MD [facility] 10/19/2023

## 2023-11-06 ENCOUNTER — Other Ambulatory Visit: Payer: Self-pay | Admitting: General Surgery

## 2023-11-06 DIAGNOSIS — L02214 Cutaneous abscess of groin: Secondary | ICD-10-CM

## 2023-11-17 ENCOUNTER — Inpatient Hospital Stay: Admission: RE | Admit: 2023-11-17 | Source: Ambulatory Visit

## 2023-11-23 ENCOUNTER — Other Ambulatory Visit: Payer: Self-pay

## 2023-11-23 ENCOUNTER — Ambulatory Visit (HOSPITAL_COMMUNITY): Admission: EM | Admit: 2023-11-23 | Discharge: 2023-11-23 | Disposition: A | Discharge status: Home / Self Care

## 2023-11-23 ENCOUNTER — Encounter (HOSPITAL_COMMUNITY): Payer: Self-pay

## 2023-11-23 ENCOUNTER — Inpatient Hospital Stay (HOSPITAL_COMMUNITY)
Admission: EM | Admit: 2023-11-23 | Discharge: 2023-11-26 | DRG: 856 | Disposition: A | Discharge status: Home Health | Attending: Family Medicine | Admitting: Family Medicine

## 2023-11-23 DIAGNOSIS — T8579XA Infection and inflammatory reaction due to other internal prosthetic devices, implants and grafts, initial encounter: Secondary | ICD-10-CM | POA: Diagnosis present

## 2023-11-23 DIAGNOSIS — B964 Proteus (mirabilis) (morganii) as the cause of diseases classified elsewhere: Secondary | ICD-10-CM | POA: Diagnosis present

## 2023-11-23 DIAGNOSIS — Z6820 Body mass index (BMI) 20.0-20.9, adult: Secondary | ICD-10-CM

## 2023-11-23 DIAGNOSIS — F17210 Nicotine dependence, cigarettes, uncomplicated: Secondary | ICD-10-CM | POA: Diagnosis present

## 2023-11-23 DIAGNOSIS — Z79899 Other long term (current) drug therapy: Secondary | ICD-10-CM

## 2023-11-23 DIAGNOSIS — Z7982 Long term (current) use of aspirin: Secondary | ICD-10-CM

## 2023-11-23 DIAGNOSIS — E785 Hyperlipidemia, unspecified: Secondary | ICD-10-CM | POA: Diagnosis present

## 2023-11-23 DIAGNOSIS — S31104A Unspecified open wound of abdominal wall, left lower quadrant without penetration into peritoneal cavity, initial encounter: Secondary | ICD-10-CM | POA: Diagnosis present

## 2023-11-23 DIAGNOSIS — T8149XA Infection following a procedure, other surgical site, initial encounter: Principal | ICD-10-CM | POA: Diagnosis present

## 2023-11-23 DIAGNOSIS — F17290 Nicotine dependence, other tobacco product, uncomplicated: Secondary | ICD-10-CM | POA: Diagnosis present

## 2023-11-23 DIAGNOSIS — K219 Gastro-esophageal reflux disease without esophagitis: Secondary | ICD-10-CM | POA: Diagnosis present

## 2023-11-23 DIAGNOSIS — L02214 Cutaneous abscess of groin: Principal | ICD-10-CM | POA: Diagnosis present

## 2023-11-23 DIAGNOSIS — G40909 Epilepsy, unspecified, not intractable, without status epilepticus: Secondary | ICD-10-CM | POA: Diagnosis present

## 2023-11-23 DIAGNOSIS — D72829 Elevated white blood cell count, unspecified: Secondary | ICD-10-CM | POA: Diagnosis present

## 2023-11-23 DIAGNOSIS — Z8673 Personal history of transient ischemic attack (TIA), and cerebral infarction without residual deficits: Secondary | ICD-10-CM

## 2023-11-23 DIAGNOSIS — I745 Embolism and thrombosis of iliac artery: Secondary | ICD-10-CM | POA: Diagnosis present

## 2023-11-23 DIAGNOSIS — S31103A Unspecified open wound of abdominal wall, right lower quadrant without penetration into peritoneal cavity, initial encounter: Secondary | ICD-10-CM | POA: Diagnosis present

## 2023-11-23 DIAGNOSIS — Z882 Allergy status to sulfonamides status: Secondary | ICD-10-CM

## 2023-11-23 DIAGNOSIS — Z8249 Family history of ischemic heart disease and other diseases of the circulatory system: Secondary | ICD-10-CM

## 2023-11-23 DIAGNOSIS — Y832 Surgical operation with anastomosis, bypass or graft as the cause of abnormal reaction of the patient, or of later complication, without mention of misadventure at the time of the procedure: Secondary | ICD-10-CM | POA: Diagnosis present

## 2023-11-23 DIAGNOSIS — I10 Essential (primary) hypertension: Secondary | ICD-10-CM | POA: Diagnosis present

## 2023-11-23 DIAGNOSIS — S31109A Unspecified open wound of abdominal wall, unspecified quadrant without penetration into peritoneal cavity, initial encounter: Secondary | ICD-10-CM | POA: Diagnosis present

## 2023-11-23 DIAGNOSIS — L0889 Other specified local infections of the skin and subcutaneous tissue: Secondary | ICD-10-CM | POA: Diagnosis present

## 2023-11-23 DIAGNOSIS — Z89511 Acquired absence of right leg below knee: Secondary | ICD-10-CM

## 2023-11-23 DIAGNOSIS — E43 Unspecified severe protein-calorie malnutrition: Secondary | ICD-10-CM | POA: Diagnosis present

## 2023-11-23 LAB — CBC WITH DIFFERENTIAL/PLATELET
Abs Immature Granulocytes: 0.02 K/uL (ref 0.00–0.07)
Basophils Absolute: 0.1 K/uL (ref 0.0–0.1)
Basophils Relative: 1 %
Eosinophils Absolute: 0 K/uL (ref 0.0–0.5)
Eosinophils Relative: 1 %
HCT: 42 % (ref 39.0–52.0)
Hemoglobin: 13.7 g/dL (ref 13.0–17.0)
Immature Granulocytes: 0 %
Lymphocytes Relative: 19 %
Lymphs Abs: 1.2 K/uL (ref 0.7–4.0)
MCH: 28.2 pg (ref 26.0–34.0)
MCHC: 32.6 g/dL (ref 30.0–36.0)
MCV: 86.4 fL (ref 80.0–100.0)
Monocytes Absolute: 0.8 K/uL (ref 0.1–1.0)
Monocytes Relative: 12 %
Neutro Abs: 4.3 K/uL (ref 1.7–7.7)
Neutrophils Relative %: 67 %
Platelets: 281 K/uL (ref 150–400)
RBC: 4.86 MIL/uL (ref 4.22–5.81)
RDW: 16.5 % — ABNORMAL HIGH (ref 11.5–15.5)
WBC: 6.4 K/uL (ref 4.0–10.5)
nRBC: 0 % (ref 0.0–0.2)

## 2023-11-23 LAB — COMPREHENSIVE METABOLIC PANEL WITH GFR
ALT: 20 U/L (ref 0–44)
AST: 34 U/L (ref 15–41)
Albumin: 3.4 g/dL — ABNORMAL LOW (ref 3.5–5.0)
Alkaline Phosphatase: 91 U/L (ref 38–126)
Anion gap: 13 (ref 5–15)
BUN: 8 mg/dL (ref 8–23)
CO2: 20 mmol/L — ABNORMAL LOW (ref 22–32)
Calcium: 8.8 mg/dL — ABNORMAL LOW (ref 8.9–10.3)
Chloride: 103 mmol/L (ref 98–111)
Creatinine, Ser: 0.89 mg/dL (ref 0.61–1.24)
GFR, Estimated: >60 mL/min (ref >60)
Glucose, Bld: 224 mg/dL — ABNORMAL HIGH (ref 70–99)
Potassium: 3.7 mmol/L (ref 3.5–5.1)
Sodium: 136 mmol/L (ref 135–145)
Total Bilirubin: 0.5 mg/dL (ref 0.0–1.2)
Total Protein: 8.5 g/dL — ABNORMAL HIGH (ref 6.5–8.1)

## 2023-11-23 NOTE — ED Triage Notes (Signed)
 Patient presents for a wound check. Patient states he has two open wound near his groin area x 1 month. Patient reports drainage and discomfort.  Caregiver is is not sure how patient develop the wounds.

## 2023-11-23 NOTE — Discharge Instructions (Addendum)
 Significant redness, fluctuance and tenderness in the left groin.  This appears to be an acute on chronic infection. Given the history of chronic groin abscesses with history of excision of infected mesh on the right and residual draining sinus on the right recommend further evaluation at the emergency room where more advanced imaging can be performed.

## 2023-11-23 NOTE — ED Provider Notes (Addendum)
 [facility]    CSN: [id number] Arrival date & time: 11/23/23  8147      History   Chief Complaint Chief Complaint  Patient presents with   Wound Check    HPI Dominic Carter is a 68 y.o. male.   68 year old male who presents urgent care with complaints of a worsening left groin abscess.  The patient has an extensive history with chronic groin abscesses.  He had a inguinal hernia repair in 2016 at an outside hospital and presented to [facility] in 2023 with a right groin abscess.  Imaging showed that he had chronic fluid collections on both sides but the right side was the only actively symptomatic.  He had an incision and drainage done at that time.  He continued to have issues with draining wounds in the area and in April of this year he underwent mesh removal and clean up on the right.  This had been doing well but a recent appointment with his surgeon he was noted to have draining from the left groin as well as some tenderness.  He continued to have some chronic drainage from the right but no signs of active infection there.  He was scheduled to have a CT of the pelvis done but he missed this appointment.  He presents today as the area in the left groin has gotten much worse and he is having significant pain.  His CT done in 2024 December showed chronic fluid collections bilateral with attenuating linear structures consistent with mesh but I had them unable to ascertain if the patient had a repair on the left from him or his care giver.   Wound Check Pertinent negatives include no chest pain, no abdominal pain and no shortness of breath.    Past Medical History:  Diagnosis Date   Chronic ischemic right MCA stroke 01/20/2020   DVT (deep venous thrombosis) (HCC)    Dyslipidemia    ETOH abuse    Hx of BKA, right (HCC)    Hypertension    Seizure disorder (HCC)    Stroke (HCC) 2022    Patient Active Problem List   Diagnosis Date Noted   Hx of BKA, right (HCC)     Seizure due to alcohol withdrawal (HCC) 10/11/2023   Altered mental status-secondary to seizure and alcohol withdrawal 10/11/2023   Tobacco abuse 09/01/2021   History of CVA (cerebrovascular accident) 09/01/2021   Alcohol withdrawal (HCC)    Hyperlipidemia 01/30/2020   Seizure (HCC) 01/20/2020   ETOH abuse 01/20/2020   Essential hypertension 01/20/2020    Past Surgical History:  Procedure Laterality Date   BELOW KNEE LEG AMPUTATION Right    HERNIA REPAIR Right 2022   INCISION AND DRAINAGE ABSCESS Right 08/03/2023   Procedure: INCISION AND DRAINAGE, ABSCESS;  Surgeon: Ebbie Cough, MD;  Location: [facility] OR;  Service: General;  Laterality: Right;  TAP BLOCK excision right groin wound and abscess drainage   IRRIGATION AND DEBRIDEMENT ABSCESS Right 01/06/2022   Procedure: IRRIGATION AND DEBRIDEMENT ABSCESS;  Surgeon: Ebbie Cough, MD;  Location: [facility] OR;  Service: General;  Laterality: Right;       Home Medications    Prior to Admission medications   Medication Sig Start Date End Date Taking? Authorizing Provider  aspirin  EC 81 MG tablet Take 81 mg by mouth daily. 10/08/22  Yes [provider]  famotidine  (PEPCID ) 40 MG tablet Take 40 mg by mouth daily. 08/14/23  Yes [provider]  folic acid  (FOLVITE ) 1 MG  tablet Take 1 tablet (1 mg total) by mouth daily. 10/18/23  Yes Akula, Vijaya, MD  hydrOXYzine  (ATARAX ) 10 MG tablet Take 1 tablet (10 mg total) by mouth 3 (three) times daily as needed. 10/17/23  Yes Akula, Vijaya, MD  levETIRAcetam  (KEPPRA ) 500 MG tablet Take 1 tablet (500 mg total) by mouth 2 (two) times daily. 10/17/23  Yes Akula, Vijaya, MD  Multiple Vitamin (MULTIVITAMIN WITH MINERALS) TABS tablet Take 1 tablet by mouth daily. 10/18/23  Yes Akula, Vijaya, MD  pravastatin  (PRAVACHOL ) 20 MG tablet Take 1 tablet (20 mg total) by mouth daily. 01/10/22  Yes Danford, Lonni SQUIBB, MD  thiamine  (VITAMIN B-1) 100 MG tablet Take 1 tablet (100 mg total) by mouth daily.  10/18/23  Yes Akula, Vijaya, MD  nicotine  (NICODERM CQ  - DOSED IN MG/24 HOURS) 14 mg/24hr patch Place 1 patch (14 mg total) onto the skin daily. 10/18/23   Cherlyn Labella, MD    Family History Family History  Problem Relation Age of Onset   Hypertension Mother    Hypertension Sister    Hypertension Brother    Hypertension Maternal Grandmother    Hypertension Maternal Grandfather     Social History Social History   Tobacco Use   Smoking status: Every Day    Current packs/day: 1.00    Types: Cigarettes   Smokeless tobacco: Never  Vaping Use   Vaping status: Some Days  Substance Use Topics   Alcohol use: Yes    Alcohol/week: 3.0 standard drinks of alcohol    Types: 3 Cans of beer per week    Comment: 07/13/23 last drink   Drug use: No     Allergies   Sulfa  antibiotics, Castor oil, and Other   Review of Systems Review of Systems  Constitutional:  Negative for chills and fever.  HENT:  Negative for ear pain and sore throat.   Eyes:  Negative for pain and visual disturbance.  Respiratory:  Negative for cough and shortness of breath.   Cardiovascular:  Negative for chest pain and palpitations.  Gastrointestinal:  Negative for abdominal pain and vomiting.  Genitourinary:  Negative for dysuria and hematuria.  Musculoskeletal:  Negative for arthralgias and back pain.  Skin:  Positive for wound. Negative for color change and rash.       Left groin and right groin  Neurological:  Negative for seizures and syncope.  All other systems reviewed and are negative.    Physical Exam Triage Vital Signs ED Triage Vitals [11/23/23 1955]  Encounter Vitals Group     BP 134/85     Girls Systolic BP Percentile      Girls Diastolic BP Percentile      Boys Systolic BP Percentile      Boys Diastolic BP Percentile      Pulse Rate (!) 108     Resp 18     Temp 97.7 F (36.5 C)     Temp Source Oral     SpO2 96 %     Weight      Height      Head Circumference      Peak Flow       Pain Score      Pain Loc      Pain Education      Exclude from Growth Chart    No data found.  Updated Vital Signs BP 134/85 (BP Location: Left Arm)   Pulse (!) 108   Temp 97.7 F (36.5 C) (Oral)   Resp 18  SpO2 96%   Visual Acuity Right Eye Distance:   Left Eye Distance:   Bilateral Distance:    Right Eye Near:   Left Eye Near:    Bilateral Near:     Physical Exam Vitals and nursing note reviewed.  Constitutional:      General: He is not in acute distress.    Appearance: He is well-developed. He is not ill-appearing or toxic-appearing.     Comments: Very thin  HENT:     Head: Normocephalic and atraumatic.  Eyes:     Conjunctiva/sclera: Conjunctivae normal.  Cardiovascular:     Rate and Rhythm: Normal rate.     Heart sounds: No murmur heard. Pulmonary:     Effort: Pulmonary effort is normal. No respiratory distress.     Breath sounds: Normal breath sounds.  Abdominal:     Palpations: Abdomen is soft.     Tenderness: There is no abdominal tenderness.  Musculoskeletal:        General: No swelling.     Cervical back: Neck supple.  Skin:    General: Skin is warm and dry.     Capillary Refill: Capillary refill takes less than 2 seconds.      Neurological:     Mental Status: He is alert.  Psychiatric:        Mood and Affect: Mood normal.      UC Treatments / Results  Labs (all labs ordered are listed, but only abnormal results are displayed) Labs Reviewed - No data to display  EKG   Radiology No results found.  Procedures Procedures (including critical care time)  Medications Ordered in UC Medications - No data to display  Initial Impression / Assessment and Plan / UC Course  I have reviewed the triage vital signs and the nursing notes.  Pertinent labs & imaging results that were available during my care of the patient were reviewed by me and considered in my medical decision making (see chart for details).     Abscess of left groin   The  patient has a large left groin abscess with a known chronic fluid collection in the area likely secondary to infected mesh.  He has already had infected mesh removed from the right groin.  At his surgical appointment recently they had recommended a CT of the pelvis to further evaluate the left side however the patient missed this appointment.  The area has worsened significantly since that time.  Given this history and the physical exam findings we recommend further evaluation in the emergency room where more advanced imaging and possible surgical intervention can be performed  Final Clinical Impressions(s) / UC Diagnoses   Final diagnoses:  Abscess of left groin     Discharge Instructions      Significant redness, fluctuance and tenderness in the left groin.  This appears to be an acute on chronic infection. Given the history of chronic groin abscesses with history of excision of infected mesh on the right and residual draining sinus on the right recommend further evaluation at the emergency room where more advanced imaging can be performed.    ED Prescriptions   None    PDMP not reviewed this encounter.   Teresa Almarie DELENA DEVONNA 11/23/23 2038    Teresa Almarie DELENA, PA-C 11/23/23 2039

## 2023-11-23 NOTE — ED Triage Notes (Signed)
 Patient sent by UC for CT eval of open groin wound, hx of infected mesh removal on right side, now has similar presentation on the L side with known infection. 9/10

## 2023-11-24 ENCOUNTER — Encounter (HOSPITAL_COMMUNITY): Payer: Self-pay | Admitting: Hospitalist

## 2023-11-24 ENCOUNTER — Inpatient Hospital Stay (HOSPITAL_COMMUNITY): Admitting: Registered Nurse

## 2023-11-24 ENCOUNTER — Encounter (HOSPITAL_COMMUNITY)
Admission: EM | Disposition: A | Discharge status: Home Health | Payer: Self-pay | Source: Home / Self Care | Attending: Family Medicine

## 2023-11-24 ENCOUNTER — Emergency Department (HOSPITAL_COMMUNITY)

## 2023-11-24 ENCOUNTER — Other Ambulatory Visit: Payer: Self-pay

## 2023-11-24 DIAGNOSIS — E43 Unspecified severe protein-calorie malnutrition: Secondary | ICD-10-CM | POA: Diagnosis present

## 2023-11-24 DIAGNOSIS — Z7982 Long term (current) use of aspirin: Secondary | ICD-10-CM | POA: Diagnosis not present

## 2023-11-24 DIAGNOSIS — K219 Gastro-esophageal reflux disease without esophagitis: Secondary | ICD-10-CM | POA: Diagnosis present

## 2023-11-24 DIAGNOSIS — I10 Essential (primary) hypertension: Secondary | ICD-10-CM | POA: Diagnosis present

## 2023-11-24 DIAGNOSIS — T8142XA Infection following a procedure, deep incisional surgical site, initial encounter: Secondary | ICD-10-CM | POA: Diagnosis not present

## 2023-11-24 DIAGNOSIS — T8149XA Infection following a procedure, other surgical site, initial encounter: Secondary | ICD-10-CM | POA: Diagnosis present

## 2023-11-24 DIAGNOSIS — Z89511 Acquired absence of right leg below knee: Secondary | ICD-10-CM | POA: Diagnosis not present

## 2023-11-24 DIAGNOSIS — B999 Unspecified infectious disease: Secondary | ICD-10-CM | POA: Diagnosis not present

## 2023-11-24 DIAGNOSIS — L0889 Other specified local infections of the skin and subcutaneous tissue: Secondary | ICD-10-CM | POA: Diagnosis present

## 2023-11-24 DIAGNOSIS — D72829 Elevated white blood cell count, unspecified: Secondary | ICD-10-CM | POA: Diagnosis present

## 2023-11-24 DIAGNOSIS — S31104A Unspecified open wound of abdominal wall, left lower quadrant without penetration into peritoneal cavity, initial encounter: Secondary | ICD-10-CM | POA: Diagnosis present

## 2023-11-24 DIAGNOSIS — F17210 Nicotine dependence, cigarettes, uncomplicated: Secondary | ICD-10-CM

## 2023-11-24 DIAGNOSIS — L02214 Cutaneous abscess of groin: Secondary | ICD-10-CM | POA: Diagnosis present

## 2023-11-24 DIAGNOSIS — G40909 Epilepsy, unspecified, not intractable, without status epilepticus: Secondary | ICD-10-CM | POA: Diagnosis present

## 2023-11-24 DIAGNOSIS — B964 Proteus (mirabilis) (morganii) as the cause of diseases classified elsewhere: Secondary | ICD-10-CM | POA: Diagnosis present

## 2023-11-24 DIAGNOSIS — Z8673 Personal history of transient ischemic attack (TIA), and cerebral infarction without residual deficits: Secondary | ICD-10-CM | POA: Diagnosis not present

## 2023-11-24 DIAGNOSIS — Y832 Surgical operation with anastomosis, bypass or graft as the cause of abnormal reaction of the patient, or of later complication, without mention of misadventure at the time of the procedure: Secondary | ICD-10-CM | POA: Diagnosis present

## 2023-11-24 DIAGNOSIS — Z6820 Body mass index (BMI) 20.0-20.9, adult: Secondary | ICD-10-CM | POA: Diagnosis not present

## 2023-11-24 DIAGNOSIS — S31109A Unspecified open wound of abdominal wall, unspecified quadrant without penetration into peritoneal cavity, initial encounter: Secondary | ICD-10-CM | POA: Diagnosis not present

## 2023-11-24 DIAGNOSIS — T8579XA Infection and inflammatory reaction due to other internal prosthetic devices, implants and grafts, initial encounter: Secondary | ICD-10-CM | POA: Diagnosis present

## 2023-11-24 DIAGNOSIS — F17290 Nicotine dependence, other tobacco product, uncomplicated: Secondary | ICD-10-CM | POA: Diagnosis present

## 2023-11-24 DIAGNOSIS — Z79899 Other long term (current) drug therapy: Secondary | ICD-10-CM | POA: Diagnosis not present

## 2023-11-24 DIAGNOSIS — E785 Hyperlipidemia, unspecified: Secondary | ICD-10-CM | POA: Diagnosis present

## 2023-11-24 DIAGNOSIS — I745 Embolism and thrombosis of iliac artery: Secondary | ICD-10-CM | POA: Diagnosis present

## 2023-11-24 DIAGNOSIS — Z882 Allergy status to sulfonamides status: Secondary | ICD-10-CM | POA: Diagnosis not present

## 2023-11-24 DIAGNOSIS — S31103A Unspecified open wound of abdominal wall, right lower quadrant without penetration into peritoneal cavity, initial encounter: Secondary | ICD-10-CM | POA: Diagnosis present

## 2023-11-24 DIAGNOSIS — Z8249 Family history of ischemic heart disease and other diseases of the circulatory system: Secondary | ICD-10-CM | POA: Diagnosis not present

## 2023-11-24 HISTORY — PX: INGUINAL HERNIA REPAIR: SHX194

## 2023-11-24 LAB — URINALYSIS, ROUTINE W REFLEX MICROSCOPIC
Bacteria, UA: NONE SEEN
Bilirubin Urine: NEGATIVE
Glucose, UA: NEGATIVE mg/dL
Hgb urine dipstick: NEGATIVE
Ketones, ur: NEGATIVE mg/dL
Leukocytes,Ua: NEGATIVE
Nitrite: NEGATIVE
Protein, ur: 30 mg/dL — AB
Specific Gravity, Urine: >1.046 — ABNORMAL HIGH (ref 1.005–1.030)
pH: 6 (ref 5.0–8.0)

## 2023-11-24 LAB — HEMOGLOBIN A1C
Hgb A1c MFr Bld: 5 % (ref 4.8–5.6)
Mean Plasma Glucose: 96.8 mg/dL

## 2023-11-24 SURGERY — REPAIR, HERNIA, INGUINAL, ADULT
Anesthesia: General | Site: Groin | Laterality: Bilateral

## 2023-11-24 MED ORDER — SUGAMMADEX SODIUM 200 MG/2ML IV SOLN
INTRAVENOUS | Status: DC | PRN
  Administered 2023-11-24: 263.2 mg via INTRAVENOUS

## 2023-11-24 MED ORDER — ACETAMINOPHEN 500 MG PO TABS
1000.0000 mg | ORAL_TABLET | Freq: Three times a day (TID) | ORAL | Status: DC
  Administered 2023-11-24 – 2023-11-26 (×7): 1000 mg via ORAL
  Filled 2023-11-24 (×7): qty 2

## 2023-11-24 MED ORDER — LIDOCAINE 2% (20 MG/ML) 5 ML SYRINGE
INTRAMUSCULAR | Status: DC | PRN
  Administered 2023-11-24: 60 mg via INTRAVENOUS

## 2023-11-24 MED ORDER — PIPERACILLIN-TAZOBACTAM 3.375 G IVPB
3.3750 g | Freq: Three times a day (TID) | INTRAVENOUS | Status: DC
  Administered 2023-11-24 – 2023-11-26 (×6): 3.375 g via INTRAVENOUS
  Filled 2023-11-24 (×6): qty 50

## 2023-11-24 MED ORDER — AMISULPRIDE (ANTIEMETIC) 5 MG/2ML IV SOLN
INTRAVENOUS | Status: AC
  Filled 2023-11-24: qty 4

## 2023-11-24 MED ORDER — DEXMEDETOMIDINE HCL IN NACL 80 MCG/20ML IV SOLN
INTRAVENOUS | Status: DC | PRN
  Administered 2023-11-24: 8 ug via INTRAVENOUS

## 2023-11-24 MED ORDER — PHENYLEPHRINE 80 MCG/ML (10ML) SYRINGE FOR IV PUSH (FOR BLOOD PRESSURE SUPPORT)
PREFILLED_SYRINGE | INTRAVENOUS | Status: DC | PRN
  Administered 2023-11-24 (×2): 160 ug via INTRAVENOUS

## 2023-11-24 MED ORDER — AMLODIPINE BESYLATE 10 MG PO TABS
10.0000 mg | ORAL_TABLET | Freq: Every day | ORAL | Status: DC
  Administered 2023-11-25 – 2023-11-26 (×2): 10 mg via ORAL
  Filled 2023-11-24 (×2): qty 1

## 2023-11-24 MED ORDER — ONDANSETRON HCL 4 MG/2ML IJ SOLN
4.0000 mg | Freq: Once | INTRAMUSCULAR | Status: AC
  Administered 2023-11-24: 4 mg via INTRAVENOUS
  Filled 2023-11-24: qty 2

## 2023-11-24 MED ORDER — ACETAMINOPHEN 10 MG/ML IV SOLN
INTRAVENOUS | Status: AC
  Filled 2023-11-24: qty 100

## 2023-11-24 MED ORDER — CEFAZOLIN SODIUM-DEXTROSE 2-4 GM/100ML-% IV SOLN
INTRAVENOUS | Status: AC
  Filled 2023-11-24: qty 100

## 2023-11-24 MED ORDER — IOHEXOL 350 MG/ML SOLN
75.0000 mL | Freq: Once | INTRAVENOUS | Status: AC | PRN
  Administered 2023-11-24: 75 mL via INTRAVENOUS

## 2023-11-24 MED ORDER — OXYCODONE HCL 5 MG PO TABS: 5.0000 mg | ORAL_TABLET | Freq: Once | ORAL | Status: DC | PRN

## 2023-11-24 MED ORDER — MEPERIDINE HCL 25 MG/ML IJ SOLN: 6.2500 mg | INTRAMUSCULAR | Status: DC | PRN

## 2023-11-24 MED ORDER — HYDROXYZINE HCL 10 MG PO TABS: 10.0000 mg | ORAL_TABLET | Freq: Three times a day (TID) | ORAL | Status: DC | PRN

## 2023-11-24 MED ORDER — ORAL CARE MOUTH RINSE: 15.0000 mL | Freq: Once | OROMUCOSAL | Status: AC

## 2023-11-24 MED ORDER — LACTATED RINGERS IV SOLN: INTRAVENOUS | Status: DC

## 2023-11-24 MED ORDER — PROPOFOL 10 MG/ML IV BOLUS
INTRAVENOUS | Status: AC
  Filled 2023-11-24: qty 20

## 2023-11-24 MED ORDER — PROPOFOL 10 MG/ML IV BOLUS
INTRAVENOUS | Status: DC | PRN
  Administered 2023-11-24: 110 mg via INTRAVENOUS

## 2023-11-24 MED ORDER — DEXAMETHASONE SODIUM PHOSPHATE 10 MG/ML IJ SOLN
INTRAMUSCULAR | Status: DC | PRN
  Administered 2023-11-24: 5 mg via INTRAVENOUS

## 2023-11-24 MED ORDER — CHLORHEXIDINE GLUCONATE CLOTH 2 % EX PADS: 6.0000 | MEDICATED_PAD | Freq: Once | CUTANEOUS | Status: DC

## 2023-11-24 MED ORDER — OXYCODONE HCL 5 MG PO TABS
5.0000 mg | ORAL_TABLET | ORAL | Status: DC | PRN
  Administered 2023-11-25 – 2023-11-26 (×4): 5 mg via ORAL
  Filled 2023-11-24 (×4): qty 1

## 2023-11-24 MED ORDER — ONDANSETRON HCL 4 MG/2ML IJ SOLN
INTRAMUSCULAR | Status: DC | PRN
  Administered 2023-11-24: 4 mg via INTRAVENOUS

## 2023-11-24 MED ORDER — SODIUM CHLORIDE 0.9 % IV SOLN: 12.5000 mg | INTRAVENOUS | Status: DC | PRN

## 2023-11-24 MED ORDER — PIPERACILLIN-TAZOBACTAM 3.375 G IVPB 30 MIN
3.3750 g | Freq: Once | INTRAVENOUS | Status: AC
  Administered 2023-11-24: 3.375 g via INTRAVENOUS

## 2023-11-24 MED ORDER — AMISULPRIDE (ANTIEMETIC) 5 MG/2ML IV SOLN
10.0000 mg | Freq: Once | INTRAVENOUS | Status: AC | PRN
  Administered 2023-11-24: 10 mg via INTRAVENOUS

## 2023-11-24 MED ORDER — ROCURONIUM BROMIDE 10 MG/ML (PF) SYRINGE
PREFILLED_SYRINGE | INTRAVENOUS | Status: DC | PRN
  Administered 2023-11-24: 70 mg via INTRAVENOUS

## 2023-11-24 MED ORDER — OXYCODONE HCL 5 MG/5ML PO SOLN: 5.0000 mg | Freq: Once | ORAL | Status: DC | PRN

## 2023-11-24 MED ORDER — BUPIVACAINE-EPINEPHRINE (PF) 0.25% -1:200000 IJ SOLN
INTRAMUSCULAR | Status: AC
  Filled 2023-11-24: qty 30

## 2023-11-24 MED ORDER — HYDROMORPHONE HCL 1 MG/ML IJ SOLN
INTRAMUSCULAR | Status: AC
  Filled 2023-11-24: qty 1

## 2023-11-24 MED ORDER — CHLORHEXIDINE GLUCONATE 0.12 % MT SOLN
15.0000 mL | Freq: Once | OROMUCOSAL | Status: AC
  Administered 2023-11-24: 15 mL via OROMUCOSAL
  Filled 2023-11-24: qty 15

## 2023-11-24 MED ORDER — FENTANYL CITRATE (PF) 250 MCG/5ML IJ SOLN
INTRAMUSCULAR | Status: AC
  Filled 2023-11-24: qty 5

## 2023-11-24 MED ORDER — ACETAMINOPHEN 10 MG/ML IV SOLN
INTRAVENOUS | Status: DC | PRN
Start: 2023-11-24 — End: 2023-11-24
  Administered 2023-11-24: 1000 mg via INTRAVENOUS

## 2023-11-24 MED ORDER — HYDROMORPHONE HCL 1 MG/ML IJ SOLN
0.2500 mg | INTRAMUSCULAR | Status: DC | PRN
  Administered 2023-11-24 (×2): 0.25 mg via INTRAVENOUS

## 2023-11-24 MED ORDER — PRAVASTATIN SODIUM 10 MG PO TABS
20.0000 mg | ORAL_TABLET | Freq: Every day | ORAL | Status: DC
  Administered 2023-11-25 – 2023-11-26 (×2): 20 mg via ORAL
  Filled 2023-11-24 (×2): qty 2

## 2023-11-24 MED ORDER — ADULT MULTIVITAMIN W/MINERALS CH
1.0000 | ORAL_TABLET | Freq: Every day | ORAL | Status: DC
  Administered 2023-11-25 – 2023-11-26 (×2): 1 via ORAL
  Filled 2023-11-24 (×3): qty 1

## 2023-11-24 MED ORDER — MORPHINE SULFATE (PF) 4 MG/ML IV SOLN
4.0000 mg | Freq: Once | INTRAVENOUS | Status: AC
  Administered 2023-11-24: 4 mg via INTRAVENOUS
  Filled 2023-11-24: qty 1

## 2023-11-24 MED ORDER — LEVETIRACETAM 500 MG PO TABS
500.0000 mg | ORAL_TABLET | Freq: Two times a day (BID) | ORAL | Status: DC
  Administered 2023-11-24 – 2023-11-26 (×5): 500 mg via ORAL
  Filled 2023-11-24 (×5): qty 1

## 2023-11-24 MED ORDER — FENTANYL CITRATE (PF) 250 MCG/5ML IJ SOLN
INTRAMUSCULAR | Status: DC | PRN
  Administered 2023-11-24 (×2): 50 ug via INTRAVENOUS

## 2023-11-24 MED ORDER — PHENYLEPHRINE HCL-NACL 20-0.9 MG/250ML-% IV SOLN
INTRAVENOUS | Status: DC | PRN
  Administered 2023-11-24: 50 ug/min via INTRAVENOUS

## 2023-11-24 MED ORDER — FOLIC ACID 1 MG PO TABS
1.0000 mg | ORAL_TABLET | Freq: Every day | ORAL | Status: DC
  Administered 2023-11-25 – 2023-11-26 (×2): 1 mg via ORAL
  Filled 2023-11-24 (×3): qty 1

## 2023-11-24 MED ORDER — NICOTINE 14 MG/24HR TD PT24
14.0000 mg | MEDICATED_PATCH | Freq: Every day | TRANSDERMAL | Status: DC
  Administered 2023-11-24 – 2023-11-26 (×3): 14 mg via TRANSDERMAL
  Filled 2023-11-24 (×3): qty 1

## 2023-11-24 MED ORDER — BUPIVACAINE-EPINEPHRINE 0.25% -1:200000 IJ SOLN: INTRAMUSCULAR | Status: DC | PRN

## 2023-11-24 MED ORDER — 0.9 % SODIUM CHLORIDE (POUR BTL) OPTIME
TOPICAL | Status: DC | PRN
  Administered 2023-11-24: 1000 mL

## 2023-11-24 MED ORDER — FAMOTIDINE 20 MG PO TABS
40.0000 mg | ORAL_TABLET | Freq: Every day | ORAL | Status: DC
  Administered 2023-11-25 – 2023-11-26 (×2): 40 mg via ORAL
  Filled 2023-11-24 (×3): qty 2

## 2023-11-24 MED ORDER — THIAMINE MONONITRATE 100 MG PO TABS
100.0000 mg | ORAL_TABLET | Freq: Every day | ORAL | Status: DC
  Administered 2023-11-25 – 2023-11-26 (×2): 100 mg via ORAL
  Filled 2023-11-24 (×2): qty 1

## 2023-11-24 SURGICAL SUPPLY — 27 items
BAG COUNTER SPONGE SURGICOUNT (BAG) ×1 IMPLANT
BLADE CLIPPER SURG (BLADE) IMPLANT
BNDG GAUZE DERMACEA FLUFF 4 (GAUZE/BANDAGES/DRESSINGS) IMPLANT
CHLORAPREP W/TINT 26 (MISCELLANEOUS) ×1 IMPLANT
COVER SURGICAL LIGHT HANDLE (MISCELLANEOUS) ×1 IMPLANT
DERMABOND ADVANCED .7 DNX12 (GAUZE/BANDAGES/DRESSINGS) ×1 IMPLANT
DRAIN PENROSE .5X12 LATEX STL (DRAIN) ×1 IMPLANT
DRAPE LAPAROTOMY TRNSV 102X78 (DRAPES) ×1 IMPLANT
ELECTRODE REM PT RTRN 9FT ADLT (ELECTROSURGICAL) ×1 IMPLANT
GAUZE 4X4 16PLY ~~LOC~~+RFID DBL (SPONGE) ×1 IMPLANT
GAUZE SPONGE 4X4 12PLY STRL (GAUZE/BANDAGES/DRESSINGS) IMPLANT
GLOVE BIO SURGEON STRL SZ7 (GLOVE) ×1 IMPLANT
GOWN STRL REUS W/ TWL LRG LVL3 (GOWN DISPOSABLE) ×2 IMPLANT
KIT BASIN OR (CUSTOM PROCEDURE TRAY) ×1 IMPLANT
KIT TURNOVER KIT B (KITS) ×1 IMPLANT
MARKER SKIN DUAL TIP RULER LAB (MISCELLANEOUS) ×1 IMPLANT
NDL HYPO 25GX1X1/2 BEV (NEEDLE) ×1 IMPLANT
NEEDLE HYPO 25GX1X1/2 BEV (NEEDLE) ×1 IMPLANT
NS IRRIG 1000ML POUR BTL (IV SOLUTION) ×1 IMPLANT
PACK GENERAL/GYN (CUSTOM PROCEDURE TRAY) ×1 IMPLANT
PAD ARMBOARD POSITIONER FOAM (MISCELLANEOUS) ×1 IMPLANT
SUT MNCRL AB 4-0 PS2 18 (SUTURE) ×1 IMPLANT
SUT PROLENE 2 0 SH DA (SUTURE) ×1 IMPLANT
SUT VIC AB 3-0 SH 27X BRD (SUTURE) ×1 IMPLANT
SYR CONTROL 10ML LL (SYRINGE) ×1 IMPLANT
TOWEL GREEN STERILE (TOWEL DISPOSABLE) ×1 IMPLANT
TOWEL GREEN STERILE FF (TOWEL DISPOSABLE) ×1 IMPLANT

## 2023-11-24 NOTE — Progress Notes (Signed)
 Subjective: Resting in bed. Reports being thirsty but denies new complaints  ROS: See above, otherwise other systems negative  Objective: Vital signs in last 24 hours: Temp:  [97.7 F (36.5 C)-98.6 F (37 C)] 97.8 F (36.6 C) (08/19 0553) Pulse Rate:  [77-108] 86 (08/19 0500) Resp:  [11-19] 11 (08/19 0500) BP: (118-151)/(68-89) 118/69 (08/19 0500) SpO2:  [96 %-100 %] 96 % (08/19 0500)    Intake/Output from previous day: No intake/output data recorded. Intake/Output this shift: No intake/output data recorded.  PE: Gen: male, NAD GU: multiple scars along the right groin without fluctuance or erythema, left groin with swelling/fluctuance and erythema, no drainage  Lab Results:  Recent Labs    11/23/23 2130  WBC 6.4  HGB 13.7  HCT 42.0  PLT 281   BMET Recent Labs    11/23/23 2130  NA 136  K 3.7  CL 103  CO2 20*  GLUCOSE 224*  BUN 8  CREATININE 0.89  CALCIUM  8.8*   PT/INR No results for input(s): LABPROT, INR in the last 72 hours. CMP     Component Value Date/Time   NA 136 11/23/2023 2130   NA 141 02/03/2020 0000   NA 142 07/29/2011 1632   K 3.7 11/23/2023 2130   K 3.7 07/29/2011 1632   CL 103 11/23/2023 2130   CL 106 07/29/2011 1632   CO2 20 (L) 11/23/2023 2130   CO2 27 07/29/2011 1632   GLUCOSE 224 (H) 11/23/2023 2130   GLUCOSE 108 (H) 07/29/2011 1632   BUN 8 11/23/2023 2130   BUN 8 02/03/2020 0000   BUN 5 (L) 07/29/2011 1632   CREATININE 0.89 11/23/2023 2130   CREATININE 0.94 07/29/2011 1632   CALCIUM  8.8 (L) 11/23/2023 2130   CALCIUM  8.0 (L) 07/29/2011 1632   PROT 8.5 (H) 11/23/2023 2130   PROT 8.0 07/29/2011 1632   ALBUMIN 3.4 (L) 11/23/2023 2130   ALBUMIN 3.6 07/29/2011 1632   AST 34 11/23/2023 2130   AST 50 (H) 07/29/2011 1632   ALT 20 11/23/2023 2130   ALT 47 07/29/2011 1632   ALKPHOS 91 11/23/2023 2130   ALKPHOS 102 07/29/2011 1632   BILITOT 0.5 11/23/2023 2130   BILITOT 0.1 (L) 07/29/2011 1632   GFRNONAA >60  11/23/2023 2130   GFRNONAA >60 07/29/2011 1632   GFRAA 90 02/03/2020 0000   GFRAA >60 07/29/2011 1632   Lipase     Component Value Date/Time   LIPASE 24 05/30/2023 1300    Studies/Results: CT PELVIS W CONTRAST Result Date: 11/24/2023 CLINICAL DATA:  Perianal abscess or fistula 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 RADIATION DOSE REDUCTION: This exam was performed according to the departmental dose-optimization program which includes automated exposure control, adjustment of the mA and/or kV according to patient size and/or use of iterative reconstruction technique. CONTRAST:  75mL OMNIPAQUE  IOHEXOL  350 MG/ML SOLN COMPARISON:  03/31/2023 FINDINGS: Urinary Tract: Ureters are decompressed. Again demonstrated is irregular bladder wall thickening most pronounced anteriorly where the anterior bladder wall abuts the chronic anterior abdominal wall soft tissue/fluid collection. Anterior bladder wall thickening may have worsened slightly since prior study. Bowel:  Unremarkable visualized pelvic bowel loops. Vascular/Lymphatic: Chronic occlusion of the left common iliac artery. Stent noted in the right common iliac artery with chronic occlusion of the right external iliac artery, common femoral artery, and visualized proximal  superficial femoral artery. Reproductive:  Prostate enlargement. Other: Again noted is extensive abnormal soft tissue and fluid in the lower anterior abdominal wall in the suprapubic region which extends to the skin surface in the inguinal regions bilaterally and extends posteriorly to abut the thickened anterior abdominal wall. Overall the abnormal fluid/soft tissue has increased since prior study. Musculoskeletal: No acute bony abnormality or suspicious focal osseous abnormality. Degenerative changes in  the visualized lumbar spine. IMPRESSION: Abnormal fluid/soft tissue attenuation in the lower anterior abdominal wall extending to the skin surface in both inguinal regions and deep to abut the anterior bladder wall which is increasingly thickened. This is concerning for a worsening infectious process in the anterior abdominal wall with secondary infection/involvement of the anterior bladder wall. 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 Electronically Signed   By: Franky Crease M.D.   On: 11/24/2023 01:27    Anti-infectives: Anti-infectives (From admission, onward)    None       Assessment/Plan  68 y/o M w/ a hx of bilateral inguinal hernia repairs presenting with bilateral groin infections  - Will proceed to the OR for bilateral groin exploration, possible mesh explantation, and possible drain placement. We discussed the alternatives and potential risks of surgery, including but not limited to: bleeding, infection, recurrent infection, damage to surrounding structures, and need for additional procedures. All questions were addressed and consent was obtained.     LOS: 0 days   Dominic Carter [name] Surgery 11/24/2023, 8:16 AM Please see Amion for pager number during day hours 7:00am-4:30pm or 7:00am -11:30am on weekends

## 2023-11-24 NOTE — ED Provider Notes (Signed)
 [place] [facility] AT [facility] Provider Note   CSN: [id number] Arrival date & time: 11/23/23  2117     Patient presents with: Abscess   ARIUS Dominic Carter is a 68 y.o. male.   HPI     This is a 68 year old male who presents with concern for groin infection.  Patient with chronic fluid collections of the bilateral groin.  He has remote inguinal hernia repair with mesh placement.  He has been seen multiple times and ultimately had mesh removed on the right side.  He was seen in follow-up by general surgery and had a CT scan ordered but has been unable to get this.  He has noted increased pain and drainage from the left groin.  He has not had any fevers.  General surgery notes reviewed.  Seen Dr. Ebbie.  Prior to Admission medications   Medication Sig Start Date End Date Taking? Authorizing Provider  aspirin  EC 81 MG tablet Take 81 mg by mouth daily. 10/08/22   [provider]  famotidine  (PEPCID ) 40 MG tablet Take 40 mg by mouth daily. 08/14/23   [provider]  folic acid  (FOLVITE ) 1 MG tablet Take 1 tablet (1 mg total) by mouth daily. 10/18/23   Akula, Vijaya, MD  hydrOXYzine  (ATARAX ) 10 MG tablet Take 1 tablet (10 mg total) by mouth 3 (three) times daily as needed. 10/17/23   Akula, Vijaya, MD  levETIRAcetam  (KEPPRA ) 500 MG tablet Take 1 tablet (500 mg total) by mouth 2 (two) times daily. 10/17/23   Akula, Vijaya, MD  Multiple Vitamin (MULTIVITAMIN WITH MINERALS) TABS tablet Take 1 tablet by mouth daily. 10/18/23   Akula, Vijaya, MD  nicotine  (NICODERM CQ  - DOSED IN MG/24 HOURS) 14 mg/24hr patch Place 1 patch (14 mg total) onto the skin daily. 10/18/23   Akula, Vijaya, MD  pravastatin  (PRAVACHOL ) 20 MG tablet Take 1 tablet (20 mg total) by mouth daily. 01/10/22   Danford, Lonni SQUIBB, MD  thiamine  (VITAMIN B-1) 100 MG tablet Take 1 tablet (100 mg total) by mouth daily. 10/18/23   Akula, Vijaya, MD    Allergies: Sulfa  antibiotics, Castor oil,  and Other    Review of Systems  Constitutional:  Negative for fever.  Genitourinary:        Groin abscess  All other systems reviewed and are negative.   Updated Vital Signs BP 120/68   Pulse 77   Temp 98.1 F (36.7 C) (Oral)   Resp 18   SpO2 100%   Physical Exam Vitals and nursing note reviewed.  Constitutional:      Appearance: He is well-developed.     Comments: Thin, chronically ill-appearing  HENT:     Head: Normocephalic and atraumatic.  Eyes:     Pupils: Pupils are equal, round, and reactive to light.  Cardiovascular:     Rate and Rhythm: Normal rate and regular rhythm.  Pulmonary:     Effort: Pulmonary effort is normal. No respiratory distress.  Abdominal:     Palpations: Abdomen is soft.     Tenderness: There is no abdominal tenderness. There is no rebound.     Comments: Fluctuance and induration noted in the left groin with obvious abscess formation, no active drainage, right groin with drainage without significant fluctuance, no overlying erythema  Musculoskeletal:     Cervical back: Neck supple.  Lymphadenopathy:     Cervical: No cervical adenopathy.  Skin:    General: Skin is warm and dry.  Neurological:  Mental Status: He is alert and oriented to person, place, and time.  Psychiatric:        Mood and Affect: Mood normal.     (all labs ordered are listed, but only abnormal results are displayed) Labs Reviewed  CBC WITH DIFFERENTIAL/PLATELET - Abnormal; Notable for the following components:      Result Value   RDW 16.5 (*)    All other components within normal limits  COMPREHENSIVE METABOLIC PANEL WITH GFR - Abnormal; Notable for the following components:   CO2 20 (*)    Glucose, Bld 224 (*)    Calcium  8.8 (*)    Total Protein 8.5 (*)    Albumin 3.4 (*)    All other components within normal limits  URINALYSIS, ROUTINE W REFLEX MICROSCOPIC    EKG: None  Radiology: CT PELVIS W CONTRAST Result Date: 11/24/2023 CLINICAL DATA:  Perianal  abscess or fistula 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 RADIATION DOSE REDUCTION: This exam was performed according to the departmental dose-optimization program which includes automated exposure control, adjustment of the mA and/or kV according to patient size and/or use of iterative reconstruction technique. CONTRAST:  75mL OMNIPAQUE  IOHEXOL  350 MG/ML SOLN COMPARISON:  03/31/2023 FINDINGS: Urinary Tract: Ureters are decompressed. Again demonstrated is irregular bladder wall thickening most pronounced anteriorly where the anterior bladder wall abuts the chronic anterior abdominal wall soft tissue/fluid collection. Anterior bladder wall thickening may have worsened slightly since prior study. Bowel:  Unremarkable visualized pelvic bowel loops. Vascular/Lymphatic: Chronic occlusion of the left common iliac artery. Stent noted in the right common iliac artery with chronic occlusion of the right external iliac artery, common femoral artery, and visualized proximal superficial femoral artery. Reproductive:  Prostate enlargement. Other: Again noted is extensive abnormal soft tissue and fluid in the lower anterior abdominal wall in the suprapubic region which extends to the skin surface in the inguinal regions bilaterally and extends posteriorly to abut the thickened anterior abdominal wall. Overall the abnormal fluid/soft tissue has increased since prior study. Musculoskeletal: No acute bony abnormality or suspicious focal osseous abnormality. Degenerative changes in the visualized lumbar spine. IMPRESSION: Abnormal fluid/soft tissue attenuation in the lower anterior abdominal wall extending to the skin surface in both inguinal regions and deep to abut the anterior bladder wall which is increasingly thickened. This is concerning for a  worsening infectious process in the anterior abdominal wall with secondary infection/involvement of the anterior bladder wall. 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 Electronically Signed   By: Franky Crease M.D.   On: 11/24/2023 01:27     Procedures   Medications Ordered in the ED  morphine  (PF) 4 MG/ML injection 4 mg (4 mg Intravenous Given 11/24/23 0050)  ondansetron  (ZOFRAN ) injection 4 mg (4 mg Intravenous Given 11/24/23 0050)  iohexol  (OMNIPAQUE ) 350 MG/ML injection 75 mL (75 mLs Intravenous Contrast Given 11/24/23 0114)                                    Medical Decision Making Amount and/or Complexity of Data Reviewed Labs: ordered. Radiology: ordered.  Risk Prescription drug management.   This patient presents to the ED for concern of draining groin abscess, this involves an extensive number of treatment options, and is  a complaint that carries with it a high risk of complications and morbidity.  I considered the following differential and admission for this acute, potentially life threatening condition.  The differential diagnosis includes acute abscess, chronic infection, infected mesh  MDM:    This is a 68 year old male who presents with concern for draining abscess of the left groin.  History of the same on the right and required mesh removal.  On clinical exam he is chronically ill-appearing but nontoxic and vital signs are reassuring.  He has some drainage from the right groin as well as palpable fluctuance and induration from the left groin.  Surgery notes reviewed.  CT imaging obtained.  No leukocytosis.  CT shows extensive fluid collection in the bilateral inguinal regions that abuts the bladder wall.  Concern for worsening infectious process.  Will consult with general surgery.  Patient made NPO.  (Labs, imaging, consults)  Labs: I Ordered, and personally interpreted labs.  The  pertinent results include: CBC, CMP  Imaging Studies ordered: I ordered imaging studies including CT I independently visualized and interpreted imaging. I agree with the radiologist interpretation  Additional history obtained from chart review.  External records from outside source obtained and reviewed including prior evaluations  Cardiac Monitoring: The patient was maintained on a cardiac monitor.  If on the cardiac monitor, I personally viewed and interpreted the cardiac monitored which showed an underlying rhythm of: Sinus  Reevaluation: After the interventions noted above, I reevaluated the patient and found that they have :stayed the same  Social Determinants of Health:  lives independently  Disposition: Per surgery  Co morbidities that complicate the patient evaluation  Past Medical History:  Diagnosis Date   Chronic ischemic right MCA stroke 01/20/2020   DVT (deep venous thrombosis) (HCC)    Dyslipidemia    ETOH abuse    Hx of BKA, right (HCC)    Hypertension    Seizure disorder (HCC)    Stroke (HCC) 2022     Medicines Meds ordered this encounter  Medications   morphine  (PF) 4 MG/ML injection 4 mg   ondansetron  (ZOFRAN ) injection 4 mg   iohexol  (OMNIPAQUE ) 350 MG/ML injection 75 mL    I have reviewed the patients home medicines and have made adjustments as needed  Problem List / ED Course: Problem List Items Addressed This Visit   None Visit Diagnoses       Groin abscess    -  Primary                Final diagnoses:  Groin abscess    ED Discharge Orders     None          Bari Charmaine FALCON, MD 11/24/23 [phone]

## 2023-11-24 NOTE — H&P (Signed)
 History and Physical    Patient: Dominic Carter FMW:[id number] DOB: 04/12/1955 DOA: 11/23/2023 DOS: the patient was seen and examined on 11/24/2023 PCP: Leron Millman, NP  Patient coming from: Home  Chief Complaint:  Chief Complaint  Patient presents with   Abscess   HPI: Dominic Carter is a 68 y.o. male with medical history significant of CVA, seizure disorder, DVT, s/p R BKA, and HTN p/w b/l inguinal abscess s/p I&D per EGS on 8/19.  Pt is a poor historian and unable to provide coherent medical history. From what I can gather per Epic review, pt underwent b/l inguinal hernia repair w/ mesh in 2016 c/b infection of R mesh s/p I&D in 01/2022 and 07/2023 who now presents with c/f infection/abscess of L mesh and smaller area on R groin for which EGS is willing to offer surgical I&D.  In the ED, pt AFVSS. Labs unremarkable. CT pelvis showed abnormal fluid/soft tissue attenuation in the lower anterior abdominal wall extending to the skin surface in both inguinal regions and deep to abut the anterior bladder wall which is increasingly thickened for which EGS was consulted who offered surgical I&D and requested medical admission.  Review of Systems: As mentioned in the history of present illness. All other systems reviewed and are negative. Past Medical History:  Diagnosis Date   Chronic ischemic right MCA stroke 01/20/2020   DVT (deep venous thrombosis) (HCC)    Dyslipidemia    ETOH abuse    Hx of BKA, right (HCC)    Hypertension    Seizure disorder (HCC)    Stroke (HCC) 2022   Past Surgical History:  Procedure Laterality Date   BELOW KNEE LEG AMPUTATION Right    HERNIA REPAIR Right 2022   INCISION AND DRAINAGE ABSCESS Right 08/03/2023   Procedure: INCISION AND DRAINAGE, ABSCESS;  Surgeon: Ebbie Cough, MD;  Location: [facility] OR;  Service: General;  Laterality: Right;  TAP BLOCK excision right groin wound and abscess drainage   IRRIGATION AND DEBRIDEMENT ABSCESS Right  01/06/2022   Procedure: IRRIGATION AND DEBRIDEMENT ABSCESS;  Surgeon: Ebbie Cough, MD;  Location: [facility] OR;  Service: General;  Laterality: Right;   Social History:  reports that he has been smoking cigarettes. He has never used smokeless tobacco. He reports current alcohol use of about 3.0 standard drinks of alcohol per week. He reports that he does not use drugs.  Allergies  Allergen Reactions   Sulfa  Antibiotics Rash    Made my heart race   Castor Oil Nausea And Vomiting   Other Nausea And Vomiting and Other (See Comments)    Boiled Fat back meat  - can't chew     Family History  Problem Relation Age of Onset   Hypertension Mother    Hypertension Sister    Hypertension Brother    Hypertension Maternal Grandmother    Hypertension Maternal Grandfather     Prior to Admission medications   Medication Sig Start Date End Date Taking? Authorizing Provider  amLODipine  (NORVASC ) 10 MG tablet Take 10 mg by mouth daily. 10/15/23  Yes [provider]  aspirin  EC 81 MG tablet Take 81 mg by mouth daily. 10/08/22  Yes [provider]  doxycycline  (VIBRA -TABS) 100 MG tablet Take 100 mg by mouth 2 (two) times daily. 11/12/23  Yes [provider]  famotidine  (PEPCID ) 40 MG tablet Take 40 mg by mouth daily. 08/14/23  Yes [provider]  folic acid  (FOLVITE ) 1 MG tablet Take 1 tablet (1 mg total) by  mouth daily. 10/18/23  Yes Akula, Vijaya, MD  hydrOXYzine  (ATARAX ) 10 MG tablet Take 1 tablet (10 mg total) by mouth 3 (three) times daily as needed. Patient taking differently: Take 10 mg by mouth 3 (three) times daily as needed for itching. 10/17/23  Yes Akula, Vijaya, MD  levETIRAcetam  (KEPPRA ) 500 MG tablet Take 1 tablet (500 mg total) by mouth 2 (two) times daily. 10/17/23  Yes Akula, Vijaya, MD  Multiple Vitamin (MULTIVITAMIN WITH MINERALS) TABS tablet Take 1 tablet by mouth daily. 10/18/23  Yes Akula, Vijaya, MD  pravastatin  (PRAVACHOL ) 20 MG tablet Take 1 tablet  (20 mg total) by mouth daily. 01/10/22  Yes Danford, Lonni SQUIBB, MD  thiamine  (VITAMIN B-1) 100 MG tablet Take 1 tablet (100 mg total) by mouth daily. 10/18/23  Yes Cherlyn Labella, MD    Physical Exam: Vitals:   11/24/23 1100 11/24/23 1115 11/24/23 1130 11/24/23 1158  BP: (!) 126/91 127/85 127/72 (!) 146/89  Pulse: 75 76 72 76  Resp: 11 11 12 16   Temp:   (!) 97.4 F (36.3 C) 97.7 F (36.5 C)  TempSrc:    Oral  SpO2: 97% 94% 95% 99%  Weight:      Height:       General: Alert, oriented x3, resting comfortably in no acute distress Respiratory: Lungs clear to auscultation bilaterally with normal respiratory effort; no w/r/r Cardiovascular: Regular rate and rhythm w/o m/r/g   Data Reviewed:  Lab Results  Component Value Date   WBC 6.4 11/23/2023   HGB 13.7 11/23/2023   HCT 42.0 11/23/2023   MCV 86.4 11/23/2023   PLT 281 11/23/2023   Lab Results  Component Value Date   GLUCOSE 224 (H) 11/23/2023   CALCIUM  8.8 (L) 11/23/2023   NA 136 11/23/2023   K 3.7 11/23/2023   CO2 20 (L) 11/23/2023   CL 103 11/23/2023   BUN 8 11/23/2023   CREATININE 0.89 11/23/2023   Lab Results  Component Value Date   ALT 20 11/23/2023   AST 34 11/23/2023   ALKPHOS 91 11/23/2023   BILITOT 0.5 11/23/2023   Lab Results  Component Value Date   INR 1.1 04/02/2023   INR 1.2 01/09/2022   INR 1.1 08/28/2020    Radiology: CT PELVIS W CONTRAST Result Date: 11/24/2023 CLINICAL DATA:  Perianal abscess or fistula 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 RADIATION DOSE REDUCTION: This exam was performed according to the departmental dose-optimization program which includes automated exposure control, adjustment of the mA and/or kV according to patient size and/or use of iterative reconstruction technique. CONTRAST:  75mL  OMNIPAQUE  IOHEXOL  350 MG/ML SOLN COMPARISON:  03/31/2023 FINDINGS: Urinary Tract: Ureters are decompressed. Again demonstrated is irregular bladder wall thickening most pronounced anteriorly where the anterior bladder wall abuts the chronic anterior abdominal wall soft tissue/fluid collection. Anterior bladder wall thickening may have worsened slightly since prior study. Bowel:  Unremarkable visualized pelvic bowel loops. Vascular/Lymphatic: Chronic occlusion of the left common iliac artery. Stent noted in the right common iliac artery with chronic occlusion of the right external iliac artery, common femoral artery, and visualized proximal superficial femoral artery. Reproductive:  Prostate enlargement. Other: Again noted is extensive abnormal soft tissue and fluid in the lower anterior abdominal wall in the suprapubic region which extends to the skin surface in the inguinal regions bilaterally and extends posteriorly to abut the  thickened anterior abdominal wall. Overall the abnormal fluid/soft tissue has increased since prior study. Musculoskeletal: No acute bony abnormality or suspicious focal osseous abnormality. Degenerative changes in the visualized lumbar spine. IMPRESSION: Abnormal fluid/soft tissue attenuation in the lower anterior abdominal wall extending to the skin surface in both inguinal regions and deep to abut the anterior bladder wall which is increasingly thickened. This is concerning for a worsening infectious process in the anterior abdominal wall with secondary infection/involvement of the anterior bladder wall. 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 Electronically Signed   By: Franky Crease M.D.   On: 11/24/2023 01:27    Assessment and Plan: 26M h/o CVA, seizure disorder, DVT, s/p R BKA, HTN, and alcohol abuse p/w b/l inguinal abscess s/p I&D per EGS on 8/19.  B/l inguinal abscess s/p I&D on  8/19 -EGS consulted; apprec eval/recs -ID consulted; apprec eval given chronic infection -IV Zosyn  for now  -Tylenol  1g TID -Oxycodone  5mg  q4h prn  HTN -PTA amlodipine  10mg  daily   H/o seizure -PTA Keppra  500mg  BID   Advance Care Planning:   Code Status: Full Code   Consults: EGS  Family Communication: N/A  Severity of Illness: The appropriate patient status for this patient is INPATIENT. Inpatient status is judged to be reasonable and necessary in order to provide the required intensity of service to ensure the patient's safety. The patient's presenting symptoms, physical exam findings, and initial radiographic and laboratory data in the context of their chronic comorbidities is felt to place them at high risk for further clinical deterioration. Furthermore, it is not anticipated that the patient will be medically stable for discharge from the hospital within 2 midnights of admission.   * I certify that at the point of admission it is my clinical judgment that the patient will require inpatient hospital care spanning beyond 2 midnights from the point of admission due to high intensity of service, high risk for further deterioration and high frequency of surveillance required.*   ------- I spent 55 minutes reviewing previous notes, at the bedside counseling/discussing the treatment plan, and performing clinical documentation.  Author: Marsha Ada, MD 11/24/2023 3:43 PM  For on call review [email or web address].

## 2023-11-24 NOTE — H&P (Signed)
 Dominic Carter is an 68 y.o. male.   Chief Complaint: Drainage right groin, pain left groin HPI: 68 year old male with past medical history as below is status post bilateral inguinal hernia repair with mesh at an outside hospital.  He developed some infections in the right side and has been followed by Dr. Ebbie at our practice.  He is status post incision and drainage and then subsequent removal of the mesh from his right groin 4/25.  On follow-up with Dr. Ebbie earlier this month, he had developed some drainage from the left inguinal region.  CT scan of the pelvis was ordered but he had not gotten that done yet.  He developed increasing pain in the left groin and came to the emergency room last night.  CT scan was done showing what appears to be abscess involving both inguinal regions and extending anterior to the bladder.  I was asked to see him for surgical management.  Past Medical History:  Diagnosis Date   Chronic ischemic right MCA stroke 01/20/2020   DVT (deep venous thrombosis) (HCC)    Dyslipidemia    ETOH abuse    Hx of BKA, right (HCC)    Hypertension    Seizure disorder (HCC)    Stroke (HCC) 2022    Past Surgical History:  Procedure Laterality Date   BELOW KNEE LEG AMPUTATION Right    HERNIA REPAIR Right 2022   INCISION AND DRAINAGE ABSCESS Right 08/03/2023   Procedure: INCISION AND DRAINAGE, ABSCESS;  Surgeon: Ebbie Cough, MD;  Location: [facility] OR;  Service: General;  Laterality: Right;  TAP BLOCK excision right groin wound and abscess drainage   IRRIGATION AND DEBRIDEMENT ABSCESS Right 01/06/2022   Procedure: IRRIGATION AND DEBRIDEMENT ABSCESS;  Surgeon: Ebbie Cough, MD;  Location: [facility] OR;  Service: General;  Laterality: Right;    Family History  Problem Relation Age of Onset   Hypertension Mother    Hypertension Sister    Hypertension Brother    Hypertension Maternal Grandmother    Hypertension Maternal Grandfather    Social History:  reports  that he has been smoking cigarettes. He has never used smokeless tobacco. He reports current alcohol use of about 3.0 standard drinks of alcohol per week. He reports that he does not use drugs.  Allergies:  Allergies  Allergen Reactions   Sulfa  Antibiotics Rash   Castor Oil Nausea And Vomiting   Other Nausea And Vomiting and Other (See Comments)    Boiled Fat back meat  - can't chew     (Not in a hospital admission)   Results for orders placed or performed during the hospital encounter of 11/23/23 (from the past 48 hours)  CBC with Differential     Status: Abnormal   Collection Time: 11/23/23  9:30 PM  Result Value Ref Range   WBC 6.4 4.0 - 10.5 K/uL   RBC 4.86 4.22 - 5.81 MIL/uL   Hemoglobin 13.7 13.0 - 17.0 g/dL   HCT 57.9 60.9 - 47.9 %   MCV 86.4 80.0 - 100.0 fL   MCH 28.2 26.0 - 34.0 pg   MCHC 32.6 30.0 - 36.0 g/dL   RDW 83.4 (H) 88.4 - 84.4 %   Platelets 281 150 - 400 K/uL   nRBC 0.0 0.0 - 0.2 %   Neutrophils Relative % 67 %   Neutro Abs 4.3 1.7 - 7.7 K/uL   Lymphocytes Relative 19 %   Lymphs Abs 1.2 0.7 - 4.0 K/uL   Monocytes Relative 12 %  Monocytes Absolute 0.8 0.1 - 1.0 K/uL   Eosinophils Relative 1 %   Eosinophils Absolute 0.0 0.0 - 0.5 K/uL   Basophils Relative 1 %   Basophils Absolute 0.1 0.0 - 0.1 K/uL   Immature Granulocytes 0 %   Abs Immature Granulocytes 0.02 0.00 - 0.07 K/uL    Comment: Performed at [facility], [address] [place], [place] [address]  Comprehensive metabolic panel     Status: Abnormal   Collection Time: 11/23/23  9:30 PM  Result Value Ref Range   Sodium 136 135 - 145 mmol/L   Potassium 3.7 3.5 - 5.1 mmol/L   Chloride 103 98 - 111 mmol/L   CO2 20 (L) 22 - 32 mmol/L   Glucose, Bld 224 (H) 70 - 99 mg/dL    Comment: Glucose reference range applies only to samples taken after fasting for at least 8 hours.   BUN 8 8 - 23 mg/dL   Creatinine, Ser 9.10 0.61 - 1.24 mg/dL   Calcium  8.8 (L) 8.9 - 10.3 mg/dL   Total Protein 8.5 (H)  6.5 - 8.1 g/dL   Albumin 3.4 (L) 3.5 - 5.0 g/dL   AST 34 15 - 41 U/L   ALT 20 0 - 44 U/L   Alkaline Phosphatase 91 38 - 126 U/L   Total Bilirubin 0.5 0.0 - 1.2 mg/dL   GFR, Estimated >39 >39 mL/min    Comment: (NOTE) Calculated using the CKD-EPI Creatinine Equation (2021)    Anion gap 13 5 - 15    Comment: Performed at [facility], [address], [place], [place] [address]  Urinalysis, Routine w reflex microscopic -Urine, Catheterized     Status: Abnormal   Collection Time: 11/24/23  5:23 AM  Result Value Ref Range   Color, Urine YELLOW YELLOW   APPearance CLEAR CLEAR   Specific Gravity, Urine >1.046 (H) 1.005 - 1.030   pH 6.0 5.0 - 8.0   Glucose, UA NEGATIVE NEGATIVE mg/dL   Hgb urine dipstick NEGATIVE NEGATIVE   Bilirubin Urine NEGATIVE NEGATIVE   Ketones, ur NEGATIVE NEGATIVE mg/dL   Protein, ur 30 (A) NEGATIVE mg/dL   Nitrite NEGATIVE NEGATIVE   Leukocytes,Ua NEGATIVE NEGATIVE   RBC / HPF 0-5 0 - 5 RBC/hpf   WBC, UA 0-5 0 - 5 WBC/hpf   Bacteria, UA NONE SEEN NONE SEEN   Squamous Epithelial / HPF 0-5 0 - 5 /HPF    Comment: Performed at [facility], 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 RADIATION DOSE REDUCTION: This exam was performed according to the departmental dose-optimization program which includes automated exposure control, adjustment of the mA and/or kV according to patient size and/or use of iterative reconstruction technique. CONTRAST:  75mL OMNIPAQUE  IOHEXOL  350 MG/ML SOLN COMPARISON:  03/31/2023 FINDINGS: Urinary Tract: Ureters are decompressed. Again demonstrated is irregular bladder wall thickening most pronounced anteriorly where the  anterior bladder wall abuts the chronic anterior abdominal wall soft tissue/fluid collection. Anterior bladder wall thickening may have worsened slightly since prior study. Bowel:  Unremarkable visualized pelvic bowel loops. Vascular/Lymphatic: Chronic occlusion of the left common iliac artery. Stent noted in the right common iliac artery with chronic occlusion of the right external  iliac artery, common femoral artery, and visualized proximal superficial femoral artery. Reproductive:  Prostate enlargement. Other: Again noted is extensive abnormal soft tissue and fluid in the lower anterior abdominal wall in the suprapubic region which extends to the skin surface in the inguinal regions bilaterally and extends posteriorly to abut the thickened anterior abdominal wall. Overall the abnormal fluid/soft tissue has increased since prior study. Musculoskeletal: No acute bony abnormality or suspicious focal osseous abnormality. Degenerative changes in the visualized lumbar spine. IMPRESSION: Abnormal fluid/soft tissue attenuation in the lower anterior abdominal wall extending to the skin surface in both inguinal regions and deep to abut the anterior bladder wall which is increasingly thickened. This is concerning for a worsening infectious process in the anterior abdominal wall with secondary infection/involvement of the anterior bladder wall. 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 Electronically Signed   By: Franky Crease M.D.   On: 11/24/2023 01:27    Review of Systems  Constitutional:  Positive for activity change.  HENT: Negative.    Eyes: Negative.   Respiratory: Negative.    Cardiovascular: Negative.   Gastrointestinal:        See HPI  Genitourinary: Negative.   Musculoskeletal: Negative.   Allergic/Immunologic: Negative.   Neurological: Negative.   Hematological: Negative.   Psychiatric/Behavioral: Negative.       Blood pressure 118/69, pulse 86, temperature 97.8 F (36.6 C), temperature source Oral, resp. rate 11, SpO2 96%. Physical Exam Cardiovascular:     Rate and Rhythm: Normal rate and regular rhythm.  Pulmonary:     Effort: Pulmonary effort is normal.     Breath sounds: Normal breath sounds.  Abdominal:     General: Abdomen is flat.     Palpations: Abdomen is soft.     Tenderness: There is no guarding or rebound.     Comments: Right inguinal region draining purulence from a small opening on his surgical scar Left inguinal region has multiple fluctuant areas without frank drainage, very tender  Musculoskeletal:     Cervical back: Neck supple.     Comments: Right BKA  Neurological:     Mental Status: He is alert and oriented to person, place, and time.  Psychiatric:        Mood and Affect: Mood normal.      Assessment/Plan History of bilateral inguinal hernia repair with mesh at outside hospital  Status post incision and drainage followed by removal of mesh from right groin by Dr. Ebbie 4/25  Now has infection involving bilateral inguinal regions and extending over his bladder.  I feel he will need incision and drainage of both areas.  I will discuss further with my partners and make plans for later today.  Dann FORBES Hummer, MD 11/24/2023, 6:39 AM

## 2023-11-24 NOTE — Plan of Care (Signed)
   Problem: Education: Goal: Knowledge of General Education information will improve Description: Including pain rating scale, medication(s)/side effects and non-pharmacologic comfort measures Outcome: Progressing   Problem: Pain Managment: Goal: General experience of comfort will improve and/or be controlled Outcome: Progressing   Problem: Safety: Goal: Ability to remain free from injury will improve Outcome: Progressing

## 2023-11-24 NOTE — Anesthesia Procedure Notes (Signed)
 Procedure Name: Intubation Date/Time: 11/24/2023 9:36 AM  Performed by: Virgil Ee, CRNAPre-anesthesia Checklist: Patient identified, Patient being monitored, Timeout performed, Emergency Drugs available and Suction available Patient Re-evaluated:Patient Re-evaluated prior to induction Oxygen Delivery Method: Circle system utilized Preoxygenation: Pre-oxygenation with 100% oxygen Induction Type: IV induction Ventilation: Mask ventilation without difficulty Laryngoscope Size: Mac and 4 Grade View: Grade I Tube type: Oral Tube size: 7.0 mm Number of attempts: 1 Airway Equipment and Method: Stylet Placement Confirmation: ETT inserted through vocal cords under direct vision, positive ETCO2 and breath sounds checked- equal and bilateral Secured at: 22 cm Tube secured with: Tape Dental Injury: Teeth and Oropharynx as per pre-operative assessment

## 2023-11-24 NOTE — Transfer of Care (Signed)
 Immediate Anesthesia Transfer of Care Note  Patient: Dominic Carter  Procedure(s) Performed: BILATERAL GROIN EXPLORATION W/I&D (Bilateral: Groin)  Patient Location: PACU  Anesthesia Type:General  Level of Consciousness: drowsy and responds to stimulation  Airway & Oxygen Therapy: Patient Spontanous Breathing and Patient connected to nasal cannula oxygen  Post-op Assessment: Report given to RN and Post -op Vital signs reviewed and stable  Post vital signs: Reviewed and stable  Last Vitals:  Vitals Value Taken Time  BP 149/88 11/24/23 10:50  Temp 36.3 C 11/24/23 10:50  Pulse 74 11/24/23 10:52  Resp 15 11/24/23 10:52  SpO2 97 % 11/24/23 10:52  Vitals shown include unfiled device data.  Last Pain:  Vitals:   11/24/23 0858  TempSrc:   PainSc: 8          Complications: No notable events documented.

## 2023-11-24 NOTE — Anesthesia Postprocedure Evaluation (Signed)
 Anesthesia Post Note  Patient: Dominic Carter  Procedure(s) Performed: BILATERAL GROIN EXPLORATION W/I&D (Bilateral: Groin)     Patient location during evaluation: PACU Anesthesia Type: General Level of consciousness: awake and alert Pain management: pain level controlled Vital Signs Assessment: post-procedure vital signs reviewed and stable Respiratory status: spontaneous breathing, nonlabored ventilation and respiratory function stable Cardiovascular status: blood pressure returned to baseline and stable Postop Assessment: no apparent nausea or vomiting Anesthetic complications: no   No notable events documented.  Last Vitals:  Vitals:   11/24/23 1130 11/24/23 1158  BP: 127/72 (!) 146/89  Pulse: 72 76  Resp: 12 16  Temp: (!) 36.3 C 36.5 C  SpO2: 95% 99%    Last Pain:  Vitals:   11/24/23 1158  TempSrc: Oral  PainSc:    Pain Goal: Patients Stated Pain Goal: 3 (11/24/23 1115)                 Butler Levander P[place]

## 2023-11-24 NOTE — TOC Initial Note (Signed)
 Transition of Care [facility]) - Initial/Assessment Note    Patient Details  Name: Dominic Carter MRN: [id number] Date of Birth: 09/05/1955  [facility]) CM/SW Contact:    Rosalva Jon Bloch, RN Phone Number: 11/24/2023, 12:41 PM  Clinical Narrative:                  - s/p bilateral groin exploration, excisional debridement of infected tissue, 8/19  From home with niece, [name]. PTA independent with ADL's. DME @ home, 2 RW and R prosthetic leg. Active with [facility], RN,PT,OT. Pt would like to continue utilizing [facility] services if @ d/c. Orders will be needed from MD for the resumption of home health services. Niece helped with wound care PTA, and transportation to provider visits.  TOC team following and will assist with needs...      Expected Discharge Plan: Home w Home Health Services (resides with niece) Barriers to Discharge: Continued Medical Work up   Patient Goals and CMS Choice            Expected Discharge Plan and Services   Discharge Planning Services: CM Consult   Living arrangements for the past 2 months: Single Family Home                           HH Arranged: RN, PT, OT HH Agency: [facility]        Prior Living Arrangements/Services Living arrangements for the past 2 months: Single Family Home Lives with:: Other (Comment) ([name] ( niece)) Patient language and need for interpreter reviewed:: Yes Do you feel safe going back to the place where you live?: Yes      Need for Family Participation in Patient Care: Yes (Comment) Care giver support system in place?: Yes (comment) Current home services: DME (2 RW, prosthetic leg ( r)) Criminal Activity/Legal Involvement Pertinent to Current Situation/Hospitalization: No - Comment as needed  Activities of Daily Living      Permission Sought/Granted      Share Information with NAME: Laymon Louder  Niece  [phone]           Emotional Assessment        Orientation: : Oriented to Self, Oriented to Place, Oriented to  Time, Oriented to Situation Alcohol / Substance Use: Tobacco Use, Alcohol Use (Smokes pk/day , cigarette.) Psych Involvement: No (comment)  Admission diagnosis:  Groin abscess [L02.214] Open wound of inguinal region, initial encounter [S31.109A] Patient Active Problem List   Diagnosis Date Noted   Open wound of inguinal region, initial encounter 11/24/2023   Hx of BKA, right (HCC)    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 - [place], [place] - [address] [place] [address] Phone: [phone] Fax: [phone]  [facility] [address] [place] [address] Phone: [phone] Fax: [phone]     Social Drivers of Health (SDOH) Social History: SDOH Screenings   Food Insecurity: No Food Insecurity (10/12/2023)  Housing: Low Risk  (10/12/2023)  Transportation Needs: No Transportation Needs (10/12/2023)  Utilities: Not At  Risk (10/12/2023)  Financial Resource Strain: Medium Risk (04/04/2022)   Received from [facility]  Physical Activity: Unknown (04/04/2022)   Received from [facility]  Social Connections: Socially Isolated (10/12/2023)  Stress: No Stress Concern Present (04/04/2022)   Received from [facility]  Tobacco Use: High Risk (11/24/2023)   SDOH Interventions:     Readmission Risk Interventions     No data to display

## 2023-11-24 NOTE — Anesthesia Preprocedure Evaluation (Signed)
 Anesthesia Evaluation  Patient identified by MRN, date of birth, ID band Patient awake    Reviewed: Allergy & Precautions, NPO status , Patient's Chart, lab work & pertinent test results  History of Anesthesia Complications Negative for: history of anesthetic complications  Airway Mallampati: I  TM Distance: >3 FB Neck ROM: Full    Dental  (+) Edentulous Lower, Edentulous Upper   Pulmonary Current Smoker and Patient abstained from smoking.   Pulmonary exam normal        Cardiovascular hypertension, Pt. on medications + DVT  Normal cardiovascular exam     Neuro/Psych Seizures -,  CVA  negative psych ROS   GI/Hepatic negative GI ROS,,,(+)     substance abuse  alcohol use  Endo/Other  negative endocrine ROS    Renal/GU negative Renal ROS  negative genitourinary   Musculoskeletal negative musculoskeletal ROS (+)    Abdominal   Peds  Hematology  (+) Blood dyscrasia (Hgb 11.3), anemia   Anesthesia Other Findings right inguinal abscess   Reproductive/Obstetrics negative OB ROS                              Anesthesia Physical Anesthesia Plan  ASA: 3  Anesthesia Plan: General   Post-op Pain Management:    Induction: Intravenous  PONV Risk Score and Plan: 1 and Treatment may vary due to age or medical condition and Ondansetron   Airway Management Planned: Oral ETT  Additional Equipment: None  Intra-op Plan:   Post-operative Plan: Extubation in OR  Informed Consent: I have reviewed the patients History and Physical, chart, labs and discussed the procedure including the risks, benefits and alternatives for the proposed anesthesia with the patient or authorized representative who has indicated his/her understanding and acceptance.     Dental advisory given  Plan Discussed with: CRNA  Anesthesia Plan Comments: (See PAT note written 07/31/2023 by Allison Zelenak, PA-C. Alcohol  abuse with history of seizures, at least some in part due to alcohol withdrawal but also with prior CVA history. No longer taking Keppra .   )         Anesthesia Quick Evaluation

## 2023-11-24 NOTE — Op Note (Signed)
 11/24/2023  10:34 AM  PATIENT:  Dominic Carter  68 y.o. male  Patient Care Team: Leron Millman, NP as PCP - General (Nurse Practitioner)  PRE-OPERATIVE DIAGNOSIS:  Bilateral groin infection  POST-OPERATIVE DIAGNOSIS:  Same  PROCEDURE:   Bilateral groin exploration Excisional debridement of infected tissue (8cm x 4cm x 3cm)  SURGEON:  Cordella RONAL Idler, MD  ASSISTANT: None  ANESTHESIA:   general  COUNTS:  Sponge, needle and instrument counts were reported correct x2 at the conclusion of the operation.  EBL: Minimal  DRAINS: None  SPECIMEN: None  COMPLICATIONS: None  FINDINGS: Significant amount of indurated tissue in the left groin with an obvious abscess. Right groin with a chronic appearing sinus that tracked down to a small subcutaneous cavity without purulence. No mesh visualized within the operative field  DISPOSITION: PACU in satisfactory condition  INDICATION: [name] is a 68 y/o M who underwent bilateral inguinal hernia repairs with mesh. He developed an infection on the right side and underwent incision and drainage with mesh explantation. He developed swelling of the left groin and a CT showed concern for a infection of the left groin with a possible smaller area in the right groin. I offered to take him to the OR for exploration and treatment of any infection. Written consent was obtained.  DESCRIPTION: The patient was identified in preop holding and taken to the OR where he was placed on the operating room table. SCDs were placed. General endotracheal anesthesia was induced without difficulty. His bilateral groins were then prepped and draped in the usual sterile fashion. A surgical timeout was performed indicating the correct patient, procedure, positioning and need for preoperative antibiotics.   There was some swelling in the left groin with 2 chronic appearing wounds presents. The larger of the wounds was inferior to the ligament. I made an elliptical  incision overlying this with a 15 blade and carried it down through the subcutaneous tissue with electrocautery. I encountered a cavity but no abscess. The surrounding tissues appeared indurated. I used a finder needle to attempt to locate an abscess but never found any purulence. There was a small wound superior to my incision that was also overlying the cavity. I excised this and carried it down to connect to the cavity. I turned my attention to the right groin. There was no obvious fluctuance but there was a chronic wound along the right groin crease. I excised the tract with electrocautery and found that it opened up in a small subcutaneous cavity adjacent to the pubic bone. There was no purulence and I could not visualize any mesh within the operative field. At this time, I felt that further debridement would not be beneficial. The wounds were irrigated with saline and inspected for hemostasis. The wounds were packed with kerlix and covered with a dressing.

## 2023-11-25 ENCOUNTER — Encounter (HOSPITAL_COMMUNITY): Payer: Self-pay | Admitting: General Surgery

## 2023-11-25 DIAGNOSIS — T8142XA Infection following a procedure, deep incisional surgical site, initial encounter: Secondary | ICD-10-CM

## 2023-11-25 DIAGNOSIS — D72829 Elevated white blood cell count, unspecified: Secondary | ICD-10-CM

## 2023-11-25 DIAGNOSIS — S31109A Unspecified open wound of abdominal wall, unspecified quadrant without penetration into peritoneal cavity, initial encounter: Secondary | ICD-10-CM | POA: Diagnosis not present

## 2023-11-25 DIAGNOSIS — B999 Unspecified infectious disease: Secondary | ICD-10-CM

## 2023-11-25 DIAGNOSIS — E43 Unspecified severe protein-calorie malnutrition: Secondary | ICD-10-CM

## 2023-11-25 LAB — BASIC METABOLIC PANEL WITH GFR
Anion gap: 12 (ref 5–15)
BUN: 11 mg/dL (ref 8–23)
CO2: 20 mmol/L — ABNORMAL LOW (ref 22–32)
Calcium: 8.7 mg/dL — ABNORMAL LOW (ref 8.9–10.3)
Chloride: 102 mmol/L (ref 98–111)
Creatinine, Ser: 0.83 mg/dL (ref 0.61–1.24)
GFR, Estimated: >60 mL/min (ref >60)
Glucose, Bld: 116 mg/dL — ABNORMAL HIGH (ref 70–99)
Potassium: 4.5 mmol/L (ref 3.5–5.1)
Sodium: 134 mmol/L — ABNORMAL LOW (ref 135–145)

## 2023-11-25 LAB — CBC
HCT: 39.1 % (ref 39.0–52.0)
Hemoglobin: 12.7 g/dL — ABNORMAL LOW (ref 13.0–17.0)
MCH: 28.3 pg (ref 26.0–34.0)
MCHC: 32.5 g/dL (ref 30.0–36.0)
MCV: 87.3 fL (ref 80.0–100.0)
Platelets: 218 K/uL (ref 150–400)
RBC: 4.48 MIL/uL (ref 4.22–5.81)
RDW: 16.2 % — ABNORMAL HIGH (ref 11.5–15.5)
WBC: 9.6 K/uL (ref 4.0–10.5)
nRBC: 0 % (ref 0.0–0.2)

## 2023-11-25 LAB — C-REACTIVE PROTEIN: CRP: 1.9 mg/dL — ABNORMAL HIGH (ref <1.0)

## 2023-11-25 LAB — SEDIMENTATION RATE: Sed Rate: 35 mm/h — ABNORMAL HIGH (ref 0–16)

## 2023-11-25 NOTE — Progress Notes (Signed)
 PROGRESS NOTE    Dominic Carter  FMW:[id number] DOB: [birth year] DOA: 11/23/2023 PCP: Leron Millman, NP  Chief Complaint  Patient presents with   Abscess    Brief Narrative:   [phone] h/o CVA, seizure disorder, DVT, s/p R BKA, HTN, and alcohol abuse p/w b/l inguinal abscess s/p I&D per general surgery on 8/19.   Assessment & Plan:   Principal Problem:   Open wound of inguinal region, initial encounter  B/l inguinal abscess s/p I&D on 8/19 -s/p bilateral groin exploration, excisional debridement of infected tissue  -ID consulted; they'll see him today - appreciate assistance -IV Zosyn  for now  -Tylenol  1g TID -Oxycodone  5mg  q4h prn   HTN amlodipine  10mg  daily    H/o seizure  Keppra  500mg  BID  Dyslipidemia Statin  GERD pepcid      DVT prophylaxis: SCD Code Status: full Family Communication: none Disposition:   Status is: Inpatient Remains inpatient appropriate because: need for continued inpatient care   Consultants:  ID surgery  Procedures:   8/19 Bilateral groin exploration Excisional debridement of infected tissue (8cm x 4cm x 3cm)  Antimicrobials:  Anti-infectives (From admission, onward)    Start     Dose/Rate Route Frequency Ordered Stop   11/24/23 1600  piperacillin -tazobactam (ZOSYN ) IVPB 3.375 g        3.375 g 12.5 mL/hr over 240 Minutes Intravenous Every 8 hours 11/24/23 1225     11/24/23 1000  piperacillin -tazobactam (ZOSYN ) IVPB 3.375 g        3.375 g 100 mL/hr over 30 Minutes Intravenous  Once 11/24/23 0953 11/24/23 0954   11/24/23 0919  ceFAZolin  (ANCEF ) 2-4 GM/100ML-% IVPB       Note to Pharmacy: Virgil Ee: cabinet override      11/24/23 0919 11/24/23 2129       Subjective: No new complaints  Objective: Vitals:   11/24/23 1158 11/24/23 1946 11/25/23 0511 11/25/23 0723  BP: (!) 146/89 120/70 132/84 127/86  Pulse: 76 84 72 80  Resp: 16 17 16 18   Temp: 97.7 F (36.5 C) 98.4 F (36.9 C) 97.6 F (36.4 C) 98 F (36.7 C)   TempSrc: Oral Oral    SpO2: 99% 100% 100% 97%  Weight:      Height:        Intake/Output Summary (Last 24 hours) at 11/25/2023 1353 Last data filed at 11/25/2023 0900 Gross per 24 hour  Intake 607.57 ml  Output 400 ml  Net 207.57 ml   Filed Weights   11/24/23 0831  Weight: 65.8 kg    Examination:  General exam: Appears calm and comfortable  Respiratory system: unlabored Cardiovascular system: RRR Gastrointestinal system: dressing to lower abdomen Central nervous system: Alert and oriented. No focal neurological deficits. Extremities: no lee    Data Reviewed: I have personally reviewed following labs and imaging studies  CBC: Recent Labs  Lab 11/23/23 2130 11/25/23 0504  WBC 6.4 9.6  NEUTROABS 4.3  --   HGB 13.7 12.7*  HCT 42.0 39.1  MCV 86.4 87.3  PLT 281 218    Basic Metabolic Panel: Recent Labs  Lab 11/23/23 2130 11/25/23 0504  NA 136 134*  K 3.7 4.5  CL 103 102  CO2 20* 20*  GLUCOSE 224* 116*  BUN 8 11  CREATININE 0.89 0.83  CALCIUM  8.8* 8.7*    GFR: Estimated Creatinine Clearance: 79.3 mL/min (by C-G formula based on SCr of 0.83 mg/dL).  Liver Function Tests: Recent Labs  Lab 11/23/23 2130  AST 34  ALT  20  ALKPHOS 91  BILITOT 0.5  PROT 8.5*  ALBUMIN 3.4*    CBG: No results for input(s): GLUCAP in the last 168 hours.   No results found for this or any previous visit (from the past 240 hours).       Radiology Studies: CT PELVIS W CONTRAST Result Date: 11/24/2023 CLINICAL DATA:  Perianal abscess or fistula 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 RADIATION DOSE REDUCTION: This exam was performed according to the departmental dose-optimization program which includes automated exposure control, adjustment of the mA and/or kV according to patient  size and/or use of iterative reconstruction technique. CONTRAST:  75mL OMNIPAQUE  IOHEXOL  350 MG/ML SOLN COMPARISON:  03/31/2023 FINDINGS: Urinary Tract: Ureters are decompressed. Again demonstrated is irregular bladder wall thickening most pronounced anteriorly where the anterior bladder wall abuts the chronic anterior abdominal wall soft tissue/fluid collection. Anterior bladder wall thickening may have worsened slightly since prior study. Bowel:  Unremarkable visualized pelvic bowel loops. Vascular/Lymphatic: Chronic occlusion of the left common iliac artery. Stent noted in the right common iliac artery with chronic occlusion of the right external iliac artery, common femoral artery, and visualized proximal superficial femoral artery. Reproductive:  Prostate enlargement. Other: Again noted is extensive abnormal soft tissue and fluid in the lower anterior abdominal wall in the suprapubic region which extends to the skin surface in the inguinal regions bilaterally and extends posteriorly to abut the thickened anterior abdominal wall. Overall the abnormal fluid/soft tissue has increased since prior study. Musculoskeletal: No acute bony abnormality or suspicious focal osseous abnormality. Degenerative changes in the visualized lumbar spine. IMPRESSION: Abnormal fluid/soft tissue attenuation in the lower anterior abdominal wall extending to the skin surface in both inguinal regions and deep to abut the anterior bladder wall which is increasingly thickened. This is concerning for Julia Kulzer worsening infectious process in the anterior abdominal wall with secondary infection/involvement of the anterior bladder wall. 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 Electronically Signed   By: Franky Crease M.D.   On: 11/24/2023 01:27        Scheduled Meds:  acetaminophen   1,000 mg Oral TID   amLODipine   10 mg Oral Daily   famotidine   40 mg  Oral Daily   folic acid   1 mg Oral Daily   levETIRAcetam   500 mg Oral BID   multivitamin with minerals  1 tablet Oral Daily   nicotine   14 mg Transdermal Daily   pravastatin   20 mg Oral Daily   thiamine   100 mg Oral Daily   Continuous Infusions:  piperacillin -tazobactam (ZOSYN )  IV 3.375 g (11/25/23 0857)     LOS: 1 day    Time spent: over 30 min     Meliton Monte, MD [facility]   To contact the attending provider between 7A-7P or the covering provider during after hours 7P-7A, please log into the web site [email or web address] and access using universal [place] password for that web site. If you do not have the password, please call the hospital operator.  11/25/2023, 1:53 PM

## 2023-11-25 NOTE — Consult Note (Signed)
 [facility]    Date of Admission:  11/23/2023     Total days of antibiotics 2               Reason for Consult: Bilateral Inguinal abscess  Referring Provider: Dr. Meliton  Primary Care Provider: Leron Millman, NP   ASSESSMENT:  Dominic Carter is a 68 y/o AA male with history of hernia repair in 2018 and complicated by development of chronic bilateral groin wounds with previous cultures growing E. Coli, Proteus mirabilis, Group C Streptococcus, and Bacteroides.   Dominic Carter is currently POD #1 from exploration of groin wounds with findings of significant induration without obvious abscess cavity. Discussed recommended plan of care to continue with current dose of piperacillin -tazobactam which will treat his most recent cultures despite the E. Coli not growing again in April. Reminded of importance of optimizing nutritional intake with protein and tobacco cessation to reduce risk of further complicated healing or infection. Continue post-operative wound care per General Surgery. Standard/universal precautions. Remaining medical and supportive care per Internal Medicine.   PLAN:  Continue current dose of piperacillin -tazobactam.  Post-operative wound care per General Surgery Optimize nutrition and encourage tobacco cessation. Standard / Universal precautions.  Remaining medical and supportive care per Internal Medicine.    Principal Problem:   Open wound of inguinal region, initial encounter    acetaminophen   1,000 mg Oral TID   amLODipine   10 mg Oral Daily   famotidine   40 mg Oral Daily   folic acid   1 mg Oral Daily   levETIRAcetam   500 mg Oral BID   multivitamin with minerals  1 tablet Oral Daily   nicotine   14 mg Transdermal Daily   pravastatin   20 mg Oral Daily   thiamine   100 mg Oral Daily     HPI: Dominic Carter is a 68 y.o. male with previous medical history of stroke, seizure disorder, DVT, alcohol abuse, and hypertension presenting  with drainage and discomfort from bilateral open groin wounds.   Dominic Carter initially presented to General Surgery in October 2023 with right groin pain following a previous hernia repair in 2018. CT imaging showed a large right inguinal abscess that was associated with the mesh. Brought to the OR on 01/06/22 for right inguinal abscess incision and drainage. Blood cultures were without growth and Surgical specimens grew E. Coli, Streptococcus anginosis and Bacteroides fragilis. Completed 1 week of piperacillin -tazobactam while int he hospital and discharged with 2 weeks of cephalexin  and metronidazole .   Seen by Family Medicine on 04/04/22 and had concerns about drainage from his surgical wound site that started a few days following discharge. He had not followed up with General Surgery since leaving the hospital. Was then seen on 08/03/22 in the ED at [facility] with inguinal pain and drainage. Found to have purulent drainage from a tract in his inguinal area. Vascular Surgery recommended follow up outpatinet as well as with General surgery. Prescribed a 7 day course of doxycycline . Seen by General surgery with recommendation to follow up with Dr. Ebbie with no clear sign of infection or deeper abscess. He returned to General Surgery on 11/14/22 with worsening pain and drainage. Referred to ID for antibiotic recommendations and seen by Dr. Efrain with recommendation for 2 weeks of Augmentin  and referral to General Surgery for repeat incision and drainage.   CT pelvis on 03/31/23  with concern for worsening chronic residual or recurrent abscess. IR performed CT guided aspiration of the  abdominal wall fluid in the right pelvis.  Cultures grew E. Coli (R-ampicillin, ampicillin/sulbactam, gentamicin, and sulfamethoxazole /trimethorpim) and Morganella mroganii. Was prescribed doxycycline  and cephalexin  on 04/02/23 for 7 day course.    Abscess persistent and was brought to the OR on 08/03/23 by Dr. Ebbie for  incision and drainage of right groin abscess and removal of mesh. Surgical specimens grew Proteus mirabilis, Group C Streptococcus and Bacteroides. Once again prescribed a 7 day course of doxycycline .   Dominic Carter presents to the ED on 11/23/23 with draining wounds and groin pain. Afebrile on arrival with no leukocytosis. CT pelvis with abnormal fluid/soft tissue attenuation in the lower abdominal wall concerning for worsening infection in the anterior abdominal wall. Brought to the OR on 11/24/22 by Dr. Polly for bilateral groin exploration and excisional debridement of infected tissue. Dominic Carter was prescribed doxycycline  prior to presentation to the hospital, however it upset his stomach and self-discontinued it after 2 doses. Started on piperacillin -tazobactam. No cultures were obtained during surgery with no purulence encountered in the right groin.   Dominic Carter is POD #1 and tolerating antibiotics as prescribed. Lives with his niece who is his caretaker. Has home health come out and change his dressings a couple of times a week.  Review of Systems: Review of Systems  Constitutional:  Negative for chills, fever and weight loss.  Respiratory:  Negative for cough, shortness of breath and wheezing.   Cardiovascular:  Negative for chest pain and leg swelling.  Gastrointestinal:  Negative for abdominal pain, constipation, diarrhea, nausea and vomiting.  Skin:  Negative for rash.     Past Medical History:  Diagnosis Date   Chronic ischemic right MCA stroke 01/20/2020   DVT (deep venous thrombosis) (HCC)    Dyslipidemia    ETOH abuse    Hx of BKA, right (HCC)    Hypertension    Seizure disorder (HCC)    Stroke (HCC) 2022    Social History   Tobacco Use   Smoking status: Every Day    Current packs/day: 1.00    Types: Cigarettes   Smokeless tobacco: Never  Vaping Use   Vaping status: Some Days  Substance Use Topics   Alcohol use: Yes    Alcohol/week: 3.0 standard drinks of  alcohol    Types: 3 Cans of beer per week    Comment: 07/13/23 last drink   Drug use: No    Family History  Problem Relation Age of Onset   Hypertension Mother    Hypertension Sister    Hypertension Brother    Hypertension Maternal Grandmother    Hypertension Maternal Grandfather     Allergies  Allergen Reactions   Sulfa  Antibiotics Rash    Made my heart race   Castor Oil Nausea And Vomiting   Other Nausea And Vomiting and Other (See Comments)    Boiled Fat back meat  - can't chew     OBJECTIVE: Blood pressure 127/86, pulse 80, temperature 98 F (36.7 C), resp. rate 18, height 5' 11 (1.803 m), weight 65.8 kg, SpO2 97%.  Physical Exam Constitutional:      General: He is not in acute distress.    Appearance: He is well-developed.  Cardiovascular:     Rate and Rhythm: Normal rate and regular rhythm.     Heart sounds: Normal heart sounds.  Pulmonary:     Effort: Pulmonary effort is normal.     Breath sounds: Normal breath sounds.  Skin:    General: Skin is warm and  dry.     Comments: Surgical dressings in place.   Neurological:     Mental Status: He is alert and oriented to person, place, and time.  Psychiatric:        Mood and Affect: Mood normal.     Lab Results Lab Results  Component Value Date   WBC 9.6 11/25/2023   HGB 12.7 (L) 11/25/2023   HCT 39.1 11/25/2023   MCV 87.3 11/25/2023   PLT 218 11/25/2023    Lab Results  Component Value Date   CREATININE 0.83 11/25/2023   BUN 11 11/25/2023   NA 134 (L) 11/25/2023   K 4.5 11/25/2023   CL 102 11/25/2023   CO2 20 (L) 11/25/2023    Lab Results  Component Value Date   ALT 20 11/23/2023   AST 34 11/23/2023   ALKPHOS 91 11/23/2023   BILITOT 0.5 11/23/2023     Microbiology: No results found for this or any previous visit (from the past 240 hours).   I have personally spent 40 minutes involved in face-to-face and non-face-to-face activities for this patient on the day of the visit. Professional time  spent includes the following activities: preparing to see the patient (review of tests), obtaining and reviewing separately obtained history (admission/discharge record), performing a medically appropriate examination, ordering medications, communicating with other health care professionals, documenting clinical information in the EMR, communicating results and counseling patient  regarding medication and plan of care, and care coordination.   Greg Achillies Buehl, NP [facility]  11/25/2023  2:30 PM

## 2023-11-25 NOTE — Progress Notes (Addendum)
 1 Day Post-Op  Subjective: CC: Pain at b/l groins well controlled. Tolerating diet.   Objective: Vital signs in last 24 hours: Temp:  [97.4 F (36.3 C)-98.4 F (36.9 C)] 98 F (36.7 C) (08/20 0723) Pulse Rate:  [72-84] 80 (08/20 0723) Resp:  [6-18] 18 (08/20 0723) BP: (120-149)/(70-91) 127/86 (08/20 0723) SpO2:  [94 %-100 %] 97 % (08/20 0723)    Intake/Output from previous day: 08/19 0701 - 08/20 0700 In: 917.6 [P.O.:360; I.V.:400; IV Piggyback:157.6] Out: 605 [Urine:600; Blood:5] Intake/Output this shift: No intake/output data recorded.  PE: Gen:  Alert, NAD, pleasant  Abd: Soft, ND, NT Wounds: As noted below. All with mostly granulation tissue with some mixed fibrinous and cauterized tissue. No significant drainage. No purulent drainage. Some surrounding induration without erythema, heat or fluctuance. L inferior groin wound tracts 1cm laterally and superiorly, otherwise wounds do not tract in any direction. The 2 wounds on the left are not connected.         Lab Results:  Recent Labs    11/23/23 2130 11/25/23 0504  WBC 6.4 9.6  HGB 13.7 12.7*  HCT 42.0 39.1  PLT 281 218   BMET Recent Labs    11/23/23 2130 11/25/23 0504  NA 136 134*  K 3.7 4.5  CL 103 102  CO2 20* 20*  GLUCOSE 224* 116*  BUN 8 11  CREATININE 0.89 0.83  CALCIUM  8.8* 8.7*   PT/INR No results for input(s): LABPROT, INR in the last 72 hours. CMP     Component Value Date/Time   NA 134 (L) 11/25/2023 0504   NA 141 02/03/2020 0000   NA 142 07/29/2011 1632   K 4.5 11/25/2023 0504   K 3.7 07/29/2011 1632   CL 102 11/25/2023 0504   CL 106 07/29/2011 1632   CO2 20 (L) 11/25/2023 0504   CO2 27 07/29/2011 1632   GLUCOSE 116 (H) 11/25/2023 0504   GLUCOSE 108 (H) 07/29/2011 1632   BUN 11 11/25/2023 0504   BUN 8 02/03/2020 0000   BUN 5 (L) 07/29/2011 1632   CREATININE 0.83 11/25/2023 0504   CREATININE 0.94 07/29/2011 1632   CALCIUM  8.7 (L) 11/25/2023 0504   CALCIUM  8.0 (L)  07/29/2011 1632   PROT 8.5 (H) 11/23/2023 2130   PROT 8.0 07/29/2011 1632   ALBUMIN 3.4 (L) 11/23/2023 2130   ALBUMIN 3.6 07/29/2011 1632   AST 34 11/23/2023 2130   AST 50 (H) 07/29/2011 1632   ALT 20 11/23/2023 2130   ALT 47 07/29/2011 1632   ALKPHOS 91 11/23/2023 2130   ALKPHOS 102 07/29/2011 1632   BILITOT 0.5 11/23/2023 2130   BILITOT 0.1 (L) 07/29/2011 1632   GFRNONAA >60 11/25/2023 0504   GFRNONAA >60 07/29/2011 1632   GFRAA 90 02/03/2020 0000   GFRAA >60 07/29/2011 1632   Lipase     Component Value Date/Time   LIPASE 24 05/30/2023 1300    Studies/Results: CT PELVIS W CONTRAST Result Date: 11/24/2023 CLINICAL DATA:  Perianal abscess or fistula 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 RADIATION DOSE REDUCTION: This exam was performed according to the departmental dose-optimization program which includes automated exposure control, adjustment of the mA and/or kV according to patient size and/or use of iterative reconstruction technique. CONTRAST:  75mL OMNIPAQUE  IOHEXOL  350 MG/ML SOLN COMPARISON:  03/31/2023 FINDINGS: Urinary Tract: Ureters are decompressed. Again demonstrated  is irregular bladder wall thickening most pronounced anteriorly where the anterior bladder wall abuts the chronic anterior abdominal wall soft tissue/fluid collection. Anterior bladder wall thickening may have worsened slightly since prior study. Bowel:  Unremarkable visualized pelvic bowel loops. Vascular/Lymphatic: Chronic occlusion of the left common iliac artery. Stent noted in the right common iliac artery with chronic occlusion of the right external iliac artery, common femoral artery, and visualized proximal superficial femoral artery. Reproductive:  Prostate enlargement. Other: Again noted is extensive abnormal soft tissue  and fluid in the lower anterior abdominal wall in the suprapubic region which extends to the skin surface in the inguinal regions bilaterally and extends posteriorly to abut the thickened anterior abdominal wall. Overall the abnormal fluid/soft tissue has increased since prior study. Musculoskeletal: No acute bony abnormality or suspicious focal osseous abnormality. Degenerative changes in the visualized lumbar spine. IMPRESSION: Abnormal fluid/soft tissue attenuation in the lower anterior abdominal wall extending to the skin surface in both inguinal regions and deep to abut the anterior bladder wall which is increasingly thickened. This is concerning for a worsening infectious process in the anterior abdominal wall with secondary infection/involvement of the anterior bladder wall. 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 Electronically Signed   By: Franky Crease M.D.   On: 11/24/2023 01:27    Anti-infectives: Anti-infectives (From admission, onward)    Start     Dose/Rate Route Frequency Ordered Stop   11/24/23 1600  piperacillin -tazobactam (ZOSYN ) IVPB 3.375 g        3.375 g 12.5 mL/hr over 240 Minutes Intravenous Every 8 hours 11/24/23 1225     11/24/23 1000  piperacillin -tazobactam (ZOSYN ) IVPB 3.375 g        3.375 g 100 mL/hr over 30 Minutes Intravenous  Once 11/24/23 0953 11/24/23 0954   11/24/23 0919  ceFAZolin  (ANCEF ) 2-4 GM/100ML-% IVPB       Note to Pharmacy: Virgil Ee: cabinet override      11/24/23 0919 11/24/23 2129        Assessment/Plan POD 1 s/p Bilateral groin exploration and Excisional debridement of infected tissue (8cm x 4cm x 3cm) by Dr. Polly on 11/24/23 for bilateral groin infection  - Hx of bilateral inguinal hernia repair with mesh at an outside hospital in 2016. 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  - Intra-op findings: Significant amount of indurated tissue in the left groin with an obvious abscess. Right groin with a chronic appearing sinus that tracked down to a small subcutaneous cavity without purulence. No mesh visualized within the operative field  - Afebrile. WBC wnl. Wounds clean  - No indication for further debridement  - Cont abx, consider ID consult for abx direction (selection and duration) given no obvious abscess was found - Cont BID WTD to wounds. He is going to see if his niece can help with this at d/c.  - We will follow with you  FEN - Reg VTE - SCDs, okay for chem ppx from a general surgery standpoint ID - [email or web address]    LOS: 1 day    Ozell CHRISTELLA Shaper, [facility] Surgery 11/25/2023, 9:51 AM Please see Amion for pager number during day hours 7:00am-4:30pm

## 2023-11-25 NOTE — Discharge Instructions (Signed)
Wet to Dry WOUND CARE: - Change dressing twice daily - Supplies: sterile saline, kerlex, scissors, ABD pads, tape  Remove dressing and all packing carefully, moistening with sterile saline as needed to avoid packing/internal dressing sticking to the wound. 2.   Clean edges of skin around the wound with water/gauze, making sure there is no tape debris or leakage left on skin that could cause skin irritation or breakdown. 3.   Dampen and clean kerlex with sterile saline and pack wound from wound base to skin level, making sure to take note of any possible areas of wound tracking, tunneling and packing appropriately. Wound can be packed loosely. Trim kerlex to size if a whole kerlex is not required. 4.   Cover wound with a dry ABD pad and secure with tape.  5.   Write the date/time on the dry dressing/tape to better track when the last dressing change occurred. - apply any skin protectant/powder if recommended by clinician to protect skin/skin folds. - change dressing as needed if leakage occurs, wound gets contaminated, or patient requests to shower. - You may shower daily with wound open and following the shower the wound should be dried and a clean dressing placed.  - Medical grade tape as well as packing supplies can be found at [employer] on [address] or [employer] on [address]. The remaining supplies can be found at your local drug store, [employer] etc.

## 2023-11-26 ENCOUNTER — Telehealth (HOSPITAL_COMMUNITY): Payer: Self-pay | Admitting: Pharmacy Technician

## 2023-11-26 ENCOUNTER — Other Ambulatory Visit (HOSPITAL_COMMUNITY): Payer: Self-pay

## 2023-11-26 DIAGNOSIS — S31109A Unspecified open wound of abdominal wall, unspecified quadrant without penetration into peritoneal cavity, initial encounter: Secondary | ICD-10-CM | POA: Diagnosis not present

## 2023-11-26 LAB — MAGNESIUM: Magnesium: 1.7 mg/dL (ref 1.7–2.4)

## 2023-11-26 LAB — CBC
HCT: 38.1 % — ABNORMAL LOW (ref 39.0–52.0)
Hemoglobin: 12.3 g/dL — ABNORMAL LOW (ref 13.0–17.0)
MCH: 28.1 pg (ref 26.0–34.0)
MCHC: 32.3 g/dL (ref 30.0–36.0)
MCV: 87 fL (ref 80.0–100.0)
Platelets: 236 K/uL (ref 150–400)
RBC: 4.38 MIL/uL (ref 4.22–5.81)
RDW: 16.2 % — ABNORMAL HIGH (ref 11.5–15.5)
WBC: 7.2 K/uL (ref 4.0–10.5)
nRBC: 0 % (ref 0.0–0.2)

## 2023-11-26 LAB — BASIC METABOLIC PANEL WITH GFR
Anion gap: 10 (ref 5–15)
BUN: 10 mg/dL (ref 8–23)
CO2: 23 mmol/L (ref 22–32)
Calcium: 8.5 mg/dL — ABNORMAL LOW (ref 8.9–10.3)
Chloride: 102 mmol/L (ref 98–111)
Creatinine, Ser: 1.1 mg/dL (ref 0.61–1.24)
GFR, Estimated: >60 mL/min (ref >60)
Glucose, Bld: 84 mg/dL (ref 70–99)
Potassium: 4 mmol/L (ref 3.5–5.1)
Sodium: 135 mmol/L (ref 135–145)

## 2023-11-26 LAB — PHOSPHORUS: Phosphorus: 3.2 mg/dL (ref 2.5–4.6)

## 2023-11-26 MED ORDER — ASPIRIN 81 MG PO TBEC
81.0000 mg | DELAYED_RELEASE_TABLET | Freq: Every day | ORAL | 1 refills | Status: AC
  Filled 2023-11-26: qty 30, 30d supply, fill #0

## 2023-11-26 MED ORDER — OXYCODONE HCL 5 MG PO TABS
5.0000 mg | ORAL_TABLET | Freq: Four times a day (QID) | ORAL | 0 refills | Status: AC | PRN
  Filled 2023-11-26: qty 15, 4d supply, fill #0

## 2023-11-26 MED ORDER — CIPROFLOXACIN HCL 500 MG PO TABS
500.0000 mg | ORAL_TABLET | Freq: Two times a day (BID) | ORAL | 0 refills | Status: AC
  Filled 2023-11-26: qty 30, 15d supply, fill #0

## 2023-11-26 MED ORDER — NICOTINE 14 MG/24HR TD PT24: 14.0000 mg | MEDICATED_PATCH | Freq: Every day | TRANSDERMAL | Status: AC

## 2023-11-26 MED ORDER — AMOXICILLIN-POT CLAVULANATE 875-125 MG PO TABS
1.0000 | ORAL_TABLET | Freq: Two times a day (BID) | ORAL | 0 refills | Status: AC
  Filled 2023-11-26: qty 30, 15d supply, fill #0

## 2023-11-26 NOTE — Discharge Summary (Signed)
 Physician Discharge Summary  Dominic Carter FMW:[id number] DOB: January 23, 1956 DOA: 11/23/2023  PCP: Leron Millman, NP  Admit date: 11/23/2023 Discharge date: 11/26/2023  Time spent: 40 minutes  Recommendations for Outpatient Follow-up:  Follow outpatient CBC/CMP  Follow with general surgery outpatient Follow with infectious disease outpatient Continue wound care per surgery Needs outpatient vascular follow up   Discharge Diagnoses:  Principal Problem:   Open wound of inguinal region, initial encounter   Discharge Condition: stable  Diet recommendation: heart healthy   Filed Weights   11/24/23 0831  Weight: 65.8 kg    History of present illness:   54M h/o CVA, seizure disorder, DVT, s/p R BKA, HTN, and alcohol abuse p/w b/l inguinal abscess s/p bilateral groin exploration and excisional debridement of infected tissue per general surgery on 8/19.  Seen by ID who has provided abx recs.  Plan at this time is to discharge home, family will help with dressing changes.  Stable for discharge on 8/21, see below for additional details.  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 Sheree 08/2022, at the time recommended ASA, pravastatin , smoking cessation and yearly follow up)  R BKA Noted     Procedures:  PROCEDURE:   Bilateral groin exploration Excisional debridement of infected tissue (8cm x  4cm x 3cm)  Consultations: Surgery ID  Discharge Exam: Vitals:   11/26/23 0430 11/26/23 0730  BP: 110/76 127/85  Pulse: 65 80  Resp: 16 19  Temp: 97.8 F (36.6 C) 97.8 F (36.6 C)  SpO2: 98% 100%   No new complaints  General: No acute distress. Cardiovascular: RRR Lungs: unlabored Abdomen: dressing in place, nt/nd Neurological: Alert and oriented 3. Moves all extremities 4 with equal strength. Cranial nerves II through XII grossly intact. Extremities: No clubbing or cyanosis. No edema.  Discharge Instructions   Discharge Instructions     Call MD for:  difficulty breathing, headache or visual disturbances   Complete by: As directed    Call MD for:  extreme fatigue   Complete by: As directed    Call MD for:  hives   Complete by: As directed    Call MD for:  persistant dizziness or light-headedness   Complete by: As directed    Call MD for:  persistant nausea and vomiting   Complete by: As directed    Call MD for:  redness, tenderness, or signs of infection (pain, swelling, redness, odor or green/yellow discharge around incision site)   Complete by: As directed    Call MD for:  severe uncontrolled pain   Complete by: As directed    Call MD for:  temperature >100.4   Complete by: As directed    Diet - low sodium heart healthy   Complete by: As directed    Discharge instructions   Complete by: As directed    You were seen for infection in your groin.    You've now had this surgically  managed and you've been started on antibiotics.  Continue dressing changes as recommended by general surgery.    You should follow with general surgery as an outpatient and infectious disease as scheduled.  You should follow up with vascular surgery for your chronic left common iliac artery occlusion and right external iliac/common femoral/superficial femoral artery occlusions.   Return for new, recurrent, or worsening symptoms.  Please ask your PCP to request records from this  hospitalization so they know what was done and what the next steps will be.   Discharge wound care:   Complete by: As directed    Cleanse with normal saline, pat gently dry. Fill defects with saline moistened roll gauze, top with dry gauze, ABD pads and secure with tape or mesh underwear. Change as needed for soiling, otherwise change twice daily.   Increase activity slowly   Complete by: As directed       Allergies as of 11/26/2023       Reactions   Sulfa  Antibiotics Rash   Made my heart race   Castor Oil Nausea And Vomiting   Other Nausea And Vomiting, Other (See Comments)   Boiled Fat back meat  - can't chew         Medication List     STOP taking these medications    doxycycline  100 MG tablet Commonly known as: VIBRA -TABS       TAKE these medications    amLODipine  10 MG tablet Commonly known as: NORVASC  Take 10 mg by mouth daily.   amoxicillin -clavulanate 875-125 MG tablet Commonly known as: AUGMENTIN  Take 1 tablet by mouth 2 (two) times daily for 15 days.   aspirin  EC 81 MG tablet Take 1 tablet (81 mg total) by mouth daily.   ciprofloxacin  500 MG tablet Commonly known as: Cipro  Take 1 tablet (500 mg total) by mouth 2 (two) times daily for 15 days.   famotidine  40 MG tablet Commonly known as: PEPCID  Take 40 mg by mouth daily.   folic acid  1 MG tablet Commonly known as: FOLVITE  Take 1 tablet (1 mg total) by mouth daily.   hydrOXYzine  10 MG tablet Commonly known as: ATARAX  Take 1 tablet (10 mg total) by mouth 3 (three) times daily as needed. What changed: reasons to take this   levETIRAcetam  500 MG tablet Commonly known as: KEPPRA  Take 1 tablet (500 mg total) by mouth 2 (two) times daily.   multivitamin with minerals Tabs tablet Take 1 tablet by mouth daily.   nicotine  14 mg/24hr patch Commonly known as: NICODERM CQ  - dosed in mg/24 hours Place 1 patch (14 mg total) onto the skin daily.   oxyCODONE  5 MG immediate release tablet Commonly  known as: Oxy IR/ROXICODONE  Take 1 tablet (5 mg total) by mouth every 6 (six) hours as needed for breakthrough pain.   pravastatin  20 MG tablet Commonly known as: PRAVACHOL  Take 1 tablet (20 mg total) by mouth daily.   thiamine  100 MG tablet Commonly known as: Vitamin B-1 Take 1 tablet (100 mg total) by mouth daily.               Durable Medical Equipment  (From admission, onward)           Start     Ordered   11/26/23 1413  For home use only DME Hospital bed  Once       Question Answer Comment  Length of Need 6 Months   Patient has (list medical condition): Hx of right BKA, HTN, dyslipidemia,  DVT, ETOH abuse, R MCA CVA, seizure disorder, chronic groin abscesses   The above medical condition requires: Patient requires the ability to reposition frequently   Head must be elevated greater than: 30 degrees   Bed type Semi-electric      11/26/23 1415              Discharge Care Instructions  (From admission, onward)           Start     Ordered   11/26/23 0000  Discharge wound care:       Comments: Cleanse with normal saline, pat gently dry. Fill defects with saline moistened roll gauze, top with dry gauze, ABD pads and secure with tape or mesh underwear. Change as needed for soiling, otherwise change twice daily.   11/26/23 1508           Allergies  Allergen Reactions   Sulfa  Antibiotics Rash    Made my heart race   Castor Oil Nausea And Vomiting   Other Nausea And Vomiting and Other (See Comments)    Boiled Fat back meat  - can't chew     Follow-up Information     Leron Millman, NP Follow up.   Specialty: Nurse Practitioner Contact information: [address] [place] [address] [phone]         Ebbie Cough, MD Follow up on 12/24/2023.   Specialty: General Surgery Why: 11:10am. Please bring Conya Ellinwood copy of your photo ID and insurance card. Please arrive 30 minutes prior to your appointment for paperwork. Contact  information: [address] [place] [address] [phone]         [facility] [place]. [facility] Follow up on 12/10/2023.   Specialty: Infectious Diseases Why: ID will arrange an appointment for you on 12/10/2023 - if you don't hear from them, please call to schedule an appointment. Contact information: [address] [place], [address] [place]  [address] [phone]                 The results of significant diagnostics from this hospitalization (including imaging, microbiology, ancillary and laboratory) are listed below for reference.    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 RADIATION DOSE REDUCTION: This exam was performed according to the departmental dose-optimization program which includes automated exposure control, adjustment of the mA and/or kV according to patient size and/or use of iterative reconstruction technique. CONTRAST:  75mL OMNIPAQUE  IOHEXOL  350 MG/ML SOLN COMPARISON:  03/31/2023 FINDINGS: Urinary Tract: Ureters are decompressed. Again demonstrated is irregular bladder wall thickening most pronounced anteriorly where the anterior bladder wall abuts the chronic anterior abdominal wall soft tissue/fluid collection. Anterior bladder wall thickening may have worsened slightly since prior study. Bowel:  Unremarkable visualized pelvic bowel loops. Vascular/Lymphatic: Chronic occlusion of the left common iliac artery. Stent noted in the right common iliac artery with chronic occlusion of the right external iliac artery, common femoral artery, and visualized proximal superficial femoral artery.  Reproductive:  Prostate enlargement. Other: Again noted is extensive abnormal soft tissue and fluid in the lower anterior abdominal wall in the suprapubic region which extends to the skin surface in the inguinal regions bilaterally and extends posteriorly to  abut the thickened anterior abdominal wall. Overall the abnormal fluid/soft tissue has increased since prior study. Musculoskeletal: No acute bony abnormality or suspicious focal osseous abnormality. Degenerative changes in the visualized lumbar spine. IMPRESSION: Abnormal fluid/soft tissue attenuation in the lower anterior abdominal wall extending to the skin surface in both inguinal regions and deep to abut the anterior bladder wall which is increasingly thickened. This is concerning for Casper Pagliuca worsening infectious process in the anterior abdominal wall with secondary infection/involvement of the anterior bladder wall. 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 Electronically Signed   By: Franky Crease M.D.   On: 11/24/2023 01:27    Microbiology: No results found for this or any previous visit (from the past 240 hours).   Labs: Basic Metabolic Panel: Recent Labs  Lab 11/23/23 2130 11/25/23 0504 11/26/23 0448  NA 136 134* 135  K 3.7 4.5 4.0  CL 103 102 102  CO2 20* 20* 23  GLUCOSE 224* 116* 84  BUN 8 11 10   CREATININE 0.89 0.83 1.10  CALCIUM  8.8* 8.7* 8.5*  MG  --   --  1.7  PHOS  --   --  3.2   Liver Function Tests: Recent Labs  Lab 11/23/23 2130  AST 34  ALT 20  ALKPHOS 91  BILITOT 0.5  PROT 8.5*  ALBUMIN 3.4*   No results for input(s): LIPASE, AMYLASE in the last 168 hours. No results for input(s): AMMONIA in the last 168 hours. CBC: Recent Labs  Lab 11/23/23 2130 11/25/23 0504 11/26/23 0448  WBC 6.4 9.6 7.2  NEUTROABS 4.3  --   --   HGB 13.7 12.7* 12.3*  HCT 42.0 39.1 38.1*  MCV 86.4 87.3 87.0  PLT 281 218 236   Cardiac  Enzymes: No results for input(s): CKTOTAL, CKMB, CKMBINDEX, TROPONINI in the last 168 hours. BNP: BNP (last 3 results) No results for input(s): BNP in the last 8760 hours.  ProBNP (last 3 results) No results for input(s): PROBNP in the last 8760 hours.  CBG: No results for input(s): GLUCAP in the last 168 hours.     Signed:  Meliton Monte MD.  [facility] 11/26/2023, 3:14 PM

## 2023-11-26 NOTE — Progress Notes (Signed)
    Durable Medical Equipment  (From admission, onward)           Start     Ordered   11/26/23 1413  For home use only DME Hospital bed  Once       Question Answer Comment  Length of Need 6 Months   Patient has (list medical condition): Hx of right BKA, HTN, dyslipidemia, DVT, ETOH abuse, R MCA CVA, seizure disorder, chronic groin abscesses   The above medical condition requires: Patient requires the ability to reposition frequently   Head must be elevated greater than: 30 degrees   Bed type Semi-electric      11/26/23 1415

## 2023-11-26 NOTE — Telephone Encounter (Signed)
 Patient Product/process development scientist completed.    The patient is insured through [facility]. Patient has Medicare and is not eligible for a copay card, but may be able to apply for patient assistance or Medicare RX Payment Plan (Patient Must reach out to their plan, if eligible for payment plan), if available.    Ran test claim for ciprofloxacin  (Cipro ) 500 mg and the current 14 day co-pay is $0.00.  Ran test claim for Augmentin  (amoxicillin -clavulante) 875-125 mg and the current 14 day co-pay is $0.00.  This test claim was processed through [facility]- copay amounts may vary at other pharmacies due to pharmacy/plan contracts, or as the patient moves through the different stages of their insurance plan.     Reyes Sharps, CPHT Pharmacy Technician III Certified Patient Advocate [facility] Patient Advocate Team Direct Number: [phone]  Fax: [phone]

## 2023-11-26 NOTE — Evaluation (Signed)
 Physical Therapy Evaluation & Discharge Patient Details Name: Dominic Carter MRN: [id number] DOB: 1956/02/11 Today's Date: 11/26/2023  History of Present Illness  Pt is a 68 y.o. male who presented 11/23/23 with worsening L groin abscess. The patient has an extensive history with chronic groin abscesses. S/p bil groin exploration and excisional debridement of infected tissue 8/19. PMH includes right BKA, HTN, dyslipidemia, DVT, ETOH abuse, R MCA CVA, seizure disorder, chronic groin abscesses   Clinical Impression  Pt presents with condition above. PTA, he was mod I utilizing his RW and intermittently his QC and rollator for functional mobility. He lives with his niece and nephew in a 1-level house with 3 STE. Currently, pt reports and appears to be functioning near his baseline, not needing any physical assistance to ambulate or navigate stairs while using his RW to ambulate and bil handrails for support on stairs. He reports he will request a PT referral from his MD once he has healed and feels ready. All education completed and questions answered. No further PT services needed. Will defer further mobility to nursing and mobility specialists. PT will sign off. Thank you for this referral.        If plan is discharge home, recommend the following: Assistance with cooking/housework;Assist for transportation;Help with stairs or ramp for entrance   Can travel by private vehicle        Equipment Recommendations Hospital bed (pt requesting hospital bed as his current bed rests on the floor)  Recommendations for Other Services       Functional Status Assessment Patient has not had a recent decline in their functional status     Precautions / Restrictions Precautions Precautions: Other (comment);Fall Precaution/Restrictions Comments: hx of R BKA, prosthesis in room Restrictions Weight Bearing Restrictions Per Provider Order: No      Mobility  Bed Mobility Overal bed mobility: Modified  Independent             General bed mobility comments: HOB elevated, no assistance needed    Transfers Overall transfer level: Needs assistance Equipment used: Rolling walker (2 wheels) Transfers: Sit to/from Stand Sit to Stand: Supervision           General transfer comment: Pt able to stand from EOB with supervision for safety, no LOB    Ambulation/Gait Ambulation/Gait assistance: Supervision Gait Distance (Feet): 350 Feet Assistive device: Rolling walker (2 wheels) Gait Pattern/deviations: Step-through pattern, Trunk flexed Gait velocity: reduced Gait velocity interpretation: 1.31 - 2.62 ft/sec, indicative of limited community ambulator   General Gait Details: Pt ambulates with a slow step-through gait pattern, intermittently displaying decreased feet clearance during swing phase, likely due to his flexed posture and pushing his RW distally anteriorly to him. No LOB, supervision for safety  Stairs Stairs: Yes Stairs assistance: Contact guard assist Stair Management: Two rails, Step to pattern, Forwards Number of Stairs: 2 General stair comments: Ascends and descends stairs with step-to pattern and no LOB, CGA for safety  Wheelchair Mobility     Tilt Bed    Modified Rankin (Stroke Patients Only)       Balance Overall balance assessment: Needs assistance Sitting-balance support: No upper extremity supported, Feet supported Sitting balance-Leahy Scale: Good Sitting balance - Comments: able to reach off COG to donn shoe and prosthesis without LOB   Standing balance support: Bilateral upper extremity supported, No upper extremity supported, During functional activity Standing balance-Leahy Scale: Fair Standing balance comment: able to stand statically without UE support, reliant on RW to ambulate  Pertinent Vitals/Pain Pain Assessment Pain Assessment: Faces Faces Pain Scale: Hurts little more Pain Location: bil  groins Pain Descriptors / Indicators: Sore Pain Intervention(s): Limited activity within patient's tolerance, Monitored during session, Repositioned    Home Living Family/patient expects to be discharged to:: Private residence Living Arrangements: Spouse/significant other (niece and nephew) Available Help at Discharge: Family;Available 24 hours/day (nephew) Type of Home: House Home Access: Stairs to enter Entrance Stairs-Rails: Right;Left;Can reach both Entrance Stairs-Number of Steps: 3   Home Layout: One level Home Equipment: Agricultural consultant (2 wheels);Rollator (4 wheels);Cane - quad;BSC/3in1;Other (comment) (bed is on floor)      Prior Function Prior Level of Function : Independent/Modified Independent             Mobility Comments: Uses RW primarily and intermittently uses rollator when in community. Intermittently uses his QC. No falls in past 6 months ADLs Comments: Mod I; niece cooks and cleans when able     Extremity/Trunk Assessment   Upper Extremity Assessment Upper Extremity Assessment: Overall WFL for tasks assessed    Lower Extremity Assessment Lower Extremity Assessment: RLE deficits/detail RLE Deficits / Details: hx of R BKA; denied sensory deficits bil    Cervical / Trunk Assessment Cervical / Trunk Assessment: Normal  Communication   Communication Communication: Impaired Factors Affecting Communication: Reduced clarity of speech    Cognition Arousal: Alert Behavior During Therapy: WFL for tasks assessed/performed   PT - Cognitive impairments: No family/caregiver present to determine baseline                       PT - Cognition Comments: Pt mumbles and can be difficult to understand at times. Follows cues though. Likely at baseline Following commands: Intact       Cueing Cueing Techniques: Verbal cues     General Comments General comments (skin integrity, edema, etc.): encouraged OOB with nursing and mobility specialists daily  while admitted    Exercises     Assessment/Plan    PT Assessment Patient does not need any further PT services  PT Problem List         PT Treatment Interventions      PT Goals (Current goals can be found in the Care Plan section)  Acute Rehab PT Goals Patient Stated Goal: to recover PT Goal Formulation: All assessment and education complete, DC therapy Time For Goal Achievement: 11/27/23 Potential to Achieve Goals: Good    Frequency       Co-evaluation               AM-PAC PT 6 Clicks Mobility  Outcome Measure Help needed turning from your back to your side while in a flat bed without using bedrails?: None Help needed moving from lying on your back to sitting on the side of a flat bed without using bedrails?: None Help needed moving to and from a bed to a chair (including a wheelchair)?: A Little Help needed standing up from a chair using your arms (e.g., wheelchair or bedside chair)?: A Little Help needed to walk in hospital room?: A Little Help needed climbing 3-5 steps with a railing? : A Little 6 Click Score: 20    End of Session   Activity Tolerance: Patient tolerated treatment well Patient left: in bed;with call bell/phone within reach;with bed alarm set   PT Visit Diagnosis: Unsteadiness on feet (R26.81);Other abnormalities of gait and mobility (R26.89);Pain Pain - Right/Left:  (bil) Pain - part of body:  (groins)  Time: 8672-8650 PT Time Calculation (min) (ACUTE ONLY): 22 min   Charges:   PT Evaluation $PT Eval Low Complexity: 1 Low   PT General Charges $$ ACUTE PT VISIT: 1 Visit         Theo Ferretti, PT, DPT Acute Rehabilitation Services  Office: [phone]   Theo CHRISTELLA Ferretti 11/26/2023, 2:00 PM

## 2023-11-26 NOTE — Progress Notes (Signed)
 DC order noted per MD. DC RN at bedside. Per discussion with primary RN, patient pending arrival of family around 5pm to review wound care instructions. AVS printed and placed with chart. Primary RN informed.

## 2023-11-26 NOTE — Progress Notes (Signed)
    [facility]    Date of Admission:  11/23/2023     ID: Dominic Carter is a 68 y.o. male with   Principal Problem:   Open wound of inguinal region, initial encounter    Subjective: Afebrile. Overall doing well  Medications:   acetaminophen   1,000 mg Oral TID   amLODipine   10 mg Oral Daily   famotidine   40 mg Oral Daily   folic acid   1 mg Oral Daily   levETIRAcetam   500 mg Oral BID   multivitamin with minerals  1 tablet Oral Daily   nicotine   14 mg Transdermal Daily   pravastatin   20 mg Oral Daily   thiamine   100 mg Oral Daily    Objective: Vital signs in last 24 hours: Temp:  [97.6 F (36.4 C)-98 F (36.7 C)] 97.6 F (36.4 C) (08/21 1551) Pulse Rate:  [65-80] 77 (08/21 1551) Resp:  [16-19] 18 (08/21 1551) BP: (110-143)/(76-85) 143/84 (08/21 1551) SpO2:  [98 %-100 %] 100 % (08/21 1551)  Physical Exam  Constitutional: He is oriented to person, place, and time. He appears well-developed and well-nourished. No distress.  HENT:  Mouth/Throat: Oropharynx is clear and moist. No oropharyngeal exudate.  Cardiovascular: Normal rate, regular rhythm and normal heart sounds. Exam reveals no gallop and no friction rub.  No murmur heard.  Pulmonary/Chest: Effort normal and breath sounds normal. No respiratory distress. He has no wheezes.  Abdominal: Soft. Bowel sounds are normal. He exhibits no distension. There is no tenderness. Wound dressed. Lymphadenopathy:  He has no cervical adenopathy.  Neurological: He is alert and oriented to person, place, and time.  Skin: Skin is warm and dry. No rash noted. No erythema.  Psychiatric: He has a normal mood and affect. His behavior is normal.     Lab Results Recent Labs    11/25/23 0504 11/26/23 0448  WBC 9.6 7.2  HGB 12.7* 12.3*  HCT 39.1 38.1*  NA 134* 135  K 4.5 4.0  CL 102 102  CO2 20* 23  BUN 11 10  CREATININE 0.83 1.10   Liver Panel Recent Labs    11/23/23 2130  PROT 8.5*  ALBUMIN  3.4*  AST 34  ALT 20  ALKPHOS 91  BILITOT 0.5   Sedimentation Rate Recent Labs    11/25/23 0504  ESRSEDRATE 35*   C-Reactive Protein Recent Labs    11/25/23 0504  CRP 1.9*    Microbiology: No cultures sent Studies/Results: No results found.   Assessment/Plan: Inguinal wound/ abscess s/p I x D = based on previous cultures we recommend to change iv abtx to augmentin  875mg  bid plus cipro  500mg  po bid through 9/4  Dressing wound care per general surgery    [facility] Infectious Diseases Pager: [phone]  11/26/2023, 5:38 PM

## 2023-11-26 NOTE — Progress Notes (Signed)
 Mobility Specialist Progress Note:    11/26/23 1153  Mobility  Activity Ambulated with assistance  Level of Assistance Contact guard assist, steadying assist  Assistive Device Front wheel walker  Distance Ambulated (ft) 150 ft  Activity Response Tolerated well  Mobility Referral Yes  Mobility visit 1 Mobility  Mobility Specialist Start Time (ACUTE ONLY) 0945  Mobility Specialist Stop Time (ACUTE ONLY) 1000  Mobility Specialist Time Calculation (min) (ACUTE ONLY) 15 min   Pt received in bed agreeable to mobility. No c/o throughout. Able to don RLE prosthesis independently. No physical assistance needed throughout. Left in chair w/ call bell and personal belongings in reach. All needs met. Chair alarm on.  Thersia Minder Mobility Specialist  Please contact vis Secure Chat or  Rehab Office [phone]

## 2023-11-26 NOTE — Progress Notes (Signed)
 2 Days Post-Op  Subjective: CC: Pain at b/l groins well controlled. Tolerating diet. Voiding. BM this AM.  Reports he has not called his niece yet to see if she can help with dressing changes. He will today.   Objective: Vital signs in last 24 hours: Temp:  [97.8 F (36.6 C)-98 F (36.7 C)] 97.8 F (36.6 C) (08/21 0730) Pulse Rate:  [65-80] 80 (08/21 0730) Resp:  [16-19] 19 (08/21 0730) BP: (110-127)/(75-85) 127/85 (08/21 0730) SpO2:  [98 %-100 %] 100 % (08/21 0730)    Intake/Output from previous day: 08/20 0701 - 08/21 0700 In: 480 [P.O.:480] Out: 600 [Urine:600] Intake/Output this shift: Total I/O In: -  Out: 600 [Urine:600]  PE: Gen:  Alert, NAD, pleasant Abd: Soft, ND, NT Wounds: Stable from picture 8/20. 2 left groin wounds and 1 right groin wound all with mostly granulation tissue with some mixed fibrinous and cauterized tissue. No significant drainage. No purulent drainage. Some surrounding induration without erythema or heat. The R groin has some edema below the incision but no areas of tunneling or tracking from the wound. No obvious fluctuance and no overlying skin changes. L inferior groin wound tracts 1cm laterally and superiorly, otherwise wounds do not tract in any direction. The 2 wounds on the left are not connected.   Photo from 8/20     Lab Results:  Recent Labs    11/25/23 0504 11/26/23 0448  WBC 9.6 7.2  HGB 12.7* 12.3*  HCT 39.1 38.1*  PLT 218 236   BMET Recent Labs    11/25/23 0504 11/26/23 0448  NA 134* 135  K 4.5 4.0  CL 102 102  CO2 20* 23  GLUCOSE 116* 84  BUN 11 10  CREATININE 0.83 1.10  CALCIUM  8.7* 8.5*   PT/INR No results for input(s): LABPROT, INR in the last 72 hours. CMP     Component Value Date/Time   NA 135 11/26/2023 0448   NA 141 02/03/2020 0000   NA 142 07/29/2011 1632   K 4.0 11/26/2023 0448   K 3.7 07/29/2011 1632   CL 102 11/26/2023 0448   CL 106 07/29/2011 1632   CO2 23 11/26/2023 0448   CO2  27 07/29/2011 1632   GLUCOSE 84 11/26/2023 0448   GLUCOSE 108 (H) 07/29/2011 1632   BUN 10 11/26/2023 0448   BUN 8 02/03/2020 0000   BUN 5 (L) 07/29/2011 1632   CREATININE 1.10 11/26/2023 0448   CREATININE 0.94 07/29/2011 1632   CALCIUM  8.5 (L) 11/26/2023 0448   CALCIUM  8.0 (L) 07/29/2011 1632   PROT 8.5 (H) 11/23/2023 2130   PROT 8.0 07/29/2011 1632   ALBUMIN 3.4 (L) 11/23/2023 2130   ALBUMIN 3.6 07/29/2011 1632   AST 34 11/23/2023 2130   AST 50 (H) 07/29/2011 1632   ALT 20 11/23/2023 2130   ALT 47 07/29/2011 1632   ALKPHOS 91 11/23/2023 2130   ALKPHOS 102 07/29/2011 1632   BILITOT 0.5 11/23/2023 2130   BILITOT 0.1 (L) 07/29/2011 1632   GFRNONAA >60 11/26/2023 0448   GFRNONAA >60 07/29/2011 1632   GFRAA 90 02/03/2020 0000   GFRAA >60 07/29/2011 1632   Lipase     Component Value Date/Time   LIPASE 24 05/30/2023 1300    Studies/Results: No results found.   Anti-infectives: Anti-infectives (From admission, onward)    Start     Dose/Rate Route Frequency Ordered Stop   11/24/23 1600  piperacillin -tazobactam (ZOSYN ) IVPB 3.375 g  3.375 g 12.5 mL/hr over 240 Minutes Intravenous Every 8 hours 11/24/23 1225     11/24/23 1000  piperacillin -tazobactam (ZOSYN ) IVPB 3.375 g        3.375 g 100 mL/hr over 30 Minutes Intravenous  Once 11/24/23 0953 11/24/23 0954   11/24/23 0919  ceFAZolin  (ANCEF ) 2-4 GM/100ML-% IVPB       Note to Pharmacy: Virgil Ee: cabinet override      11/24/23 0919 11/24/23 2129        Assessment/Plan POD 2 s/p Bilateral groin exploration and Excisional debridement of infected tissue (8cm x 4cm x 3cm) by Dr. Polly on 11/24/23 for bilateral groin infection  - Hx of bilateral inguinal hernia repair with mesh at an outside hospital in 2016. 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  - Intra-op findings: Significant amount of indurated tissue  in the left groin with an obvious abscess. Right groin with a chronic appearing sinus that tracked down to a small subcutaneous cavity without purulence. No mesh visualized within the operative field  - Afebrile. WBC wnl. Wounds clean.  - No indication for further debridement  - Cont abx per ID - Cont BID WTD to wounds. He is going to see if his niece can help with this at d/c.  - Will arrange follow up in our office  FEN - Reg VTE - SCDs, okay for chem ppx from a general surgery standpoint ID - [email or web address]    LOS: 2 days    Ozell CHRISTELLA Shaper, [facility] Surgery 11/26/2023, 9:20 AM Please see Amion for pager number during day hours 7:00am-4:30pm

## 2023-11-26 NOTE — TOC Progression Note (Signed)
 Transition of Care [facility]) - Progression Note    Patient Details  Name: Dominic Carter MRN: [id number] Date of Birth: June 05, 1955  Transition of Care [facility]) CM/SW Contact  Rosalva Jon Bloch, RN Phone Number: 11/26/2023, 2:21 PM  Clinical Narrative:    Referral made for DME : hospital bed with [facility]. Equipment will be delivered to pt's residence by tomorrow.  IPCM FOLLOWING....  Expected Discharge Plan: Home w Home Health Services (resides with niece) Barriers to Discharge: Continued Medical Work up               Expected Discharge Plan and Services   Discharge Planning Services: CM Consult   Living arrangements for the past 2 months: Single Family Home                 DME Arranged: Hospital bed   Date DME Agency Contacted: 11/26/23 Time DME Agency Contacted: 1420 Representative spoke with at DME Agency: [facility] HH Arranged: RN, PT, OT HH Agency: [facility]         Social Drivers of Health (SDOH) Interventions SDOH Screenings   Food Insecurity: No Food Insecurity (11/24/2023)  Housing: Low Risk  (11/24/2023)  Transportation Needs: No Transportation Needs (11/24/2023)  Utilities: Not At Risk (11/24/2023)  Financial Resource Strain: Medium Risk (04/04/2022)   Received from [facility]  Physical Activity: Unknown (04/04/2022)   Received from [facility]  Social Connections: Socially Isolated (11/24/2023)  Stress: No Stress Concern Present (04/04/2022)   Received from [facility]  Tobacco Use: High Risk (11/24/2023)    Readmission Risk Interventions     No data to display

## 2023-12-10 ENCOUNTER — Inpatient Hospital Stay: Admitting: Family

## 2023-12-15 ENCOUNTER — Telehealth: Payer: Self-pay

## 2023-12-15 ENCOUNTER — Inpatient Hospital Stay: Admitting: Family

## 2023-12-15 NOTE — Telephone Encounter (Signed)
Error Dung Prien M Saman Giddens, RMA  

## 2023-12-16 ENCOUNTER — Telehealth: Payer: Self-pay

## 2023-12-16 ENCOUNTER — Inpatient Hospital Stay: Admitting: Internal Medicine

## 2023-12-16 NOTE — Telephone Encounter (Signed)
 Called patient to reschedule missed appointment. no answer left voice message to contact office.

## 2023-12-22 ENCOUNTER — Emergency Department (HOSPITAL_COMMUNITY)
Admission: EM | Admit: 2023-12-22 | Discharge: 2023-12-23 | Disposition: A | Discharge status: Home / Self Care | Attending: Emergency Medicine | Admitting: Emergency Medicine

## 2023-12-22 DIAGNOSIS — R569 Unspecified convulsions: Secondary | ICD-10-CM | POA: Insufficient documentation

## 2023-12-22 LAB — COMPREHENSIVE METABOLIC PANEL WITH GFR
ALT: 19 U/L (ref 0–44)
AST: 34 U/L (ref 15–41)
Albumin: 3.4 g/dL — ABNORMAL LOW (ref 3.5–5.0)
Alkaline Phosphatase: 65 U/L (ref 38–126)
Anion gap: 12 (ref 5–15)
BUN: 8 mg/dL (ref 8–23)
CO2: 22 mmol/L (ref 22–32)
Calcium: 8.5 mg/dL — ABNORMAL LOW (ref 8.9–10.3)
Chloride: 106 mmol/L (ref 98–111)
Creatinine, Ser: 0.88 mg/dL (ref 0.61–1.24)
GFR, Estimated: >60 mL/min (ref >60)
Glucose, Bld: 86 mg/dL (ref 70–99)
Potassium: 4.1 mmol/L (ref 3.5–5.1)
Sodium: 140 mmol/L (ref 135–145)
Total Bilirubin: <0.2 mg/dL (ref 0.0–1.2)
Total Protein: 7 g/dL (ref 6.5–8.1)

## 2023-12-22 LAB — CBC WITH DIFFERENTIAL/PLATELET
Abs Immature Granulocytes: 0.02 K/uL (ref 0.00–0.07)
Basophils Absolute: 0 K/uL (ref 0.0–0.1)
Basophils Relative: 1 %
Eosinophils Absolute: 0 K/uL (ref 0.0–0.5)
Eosinophils Relative: 0 %
HCT: 37.9 % — ABNORMAL LOW (ref 39.0–52.0)
Hemoglobin: 12 g/dL — ABNORMAL LOW (ref 13.0–17.0)
Immature Granulocytes: 0 %
Lymphocytes Relative: 12 %
Lymphs Abs: 0.7 K/uL (ref 0.7–4.0)
MCH: 28.2 pg (ref 26.0–34.0)
MCHC: 31.7 g/dL (ref 30.0–36.0)
MCV: 89 fL (ref 80.0–100.0)
Monocytes Absolute: 0.9 K/uL (ref 0.1–1.0)
Monocytes Relative: 14 %
Neutro Abs: 4.5 K/uL (ref 1.7–7.7)
Neutrophils Relative %: 73 %
Platelets: 127 K/uL — ABNORMAL LOW (ref 150–400)
RBC: 4.26 MIL/uL (ref 4.22–5.81)
RDW: 16.8 % — ABNORMAL HIGH (ref 11.5–15.5)
WBC: 6.1 K/uL (ref 4.0–10.5)
nRBC: 0 % (ref 0.0–0.2)

## 2023-12-22 LAB — CBG MONITORING, ED: Glucose-Capillary: 91 mg/dL (ref 70–99)

## 2023-12-22 LAB — RAPID URINE DRUG SCREEN, HOSP PERFORMED
Amphetamines: NOT DETECTED
Barbiturates: NOT DETECTED
Benzodiazepines: NOT DETECTED
Cocaine: NOT DETECTED
Opiates: NOT DETECTED
Tetrahydrocannabinol: NOT DETECTED

## 2023-12-22 LAB — URINALYSIS, ROUTINE W REFLEX MICROSCOPIC
Bacteria, UA: NONE SEEN
Bilirubin Urine: NEGATIVE
Glucose, UA: NEGATIVE mg/dL
Hgb urine dipstick: NEGATIVE
Ketones, ur: NEGATIVE mg/dL
Leukocytes,Ua: NEGATIVE
Nitrite: NEGATIVE
Protein, ur: 30 mg/dL — AB
Specific Gravity, Urine: 1.014 (ref 1.005–1.030)
pH: 6 (ref 5.0–8.0)

## 2023-12-22 LAB — MAGNESIUM: Magnesium: 1.9 mg/dL (ref 1.7–2.4)

## 2023-12-22 MED ORDER — LEVETIRACETAM 500 MG PO TABS
500.0000 mg | ORAL_TABLET | Freq: Once | ORAL | Status: AC
  Administered 2023-12-22: 500 mg via ORAL
  Filled 2023-12-22: qty 1

## 2023-12-22 NOTE — ED Notes (Signed)
 PT ASKING FOR WATER. INFORMED PT THAT THIS RN IS UNABLE TO GIVE FLUIDS BC PT HAS NOT BEEN SEEN AND IS NPO AT THIS TIME

## 2023-12-22 NOTE — ED Provider Triage Note (Signed)
 Emergency Medicine Provider Triage Evaluation Note  YASH CACCIOLA , a 68 y.o. male  was evaluated in triage.  Pt complains of felt like he was going to have a seizure but did not.  Now asymptomatic.  Reports he has been taking his medicine at home..  Review of Systems  Positive: None Negative: Cp  Physical Exam  BP 136/84   Pulse 84   Temp 98.3 F (36.8 C) (Oral)   Resp 16   SpO2 98%  Gen:   Awake, no distress   Resp:  Normal effort  MSK:   Moves extremities without difficulty  Other:    Medical Decision Making  Medically screening exam initiated at 1:15 PM.  Appropriate orders placed.  Eudell MARLA Lay was informed that the remainder of the evaluation will be completed by another provider, this initial triage assessment does not replace that evaluation, and the importance of remaining in the ED until their evaluation is complete.     Shermon Warren SAILOR, PA-C 12/22/23 1316

## 2023-12-22 NOTE — ED Triage Notes (Signed)
 Pt BIB EMS from home hx of seizures w aura, nervousness, nausea. This morning started feeling those feelings about 1 hour ago. Pt has not had seizure but was worried one was coming on. Pt last took his meds yesterday but someone else organizes his meds so he is not sure what seizure meds he is on.

## 2023-12-22 NOTE — ED Provider Notes (Signed)
 [facility] Emergency Department Provider Note MRN:  [id number]  Arrival date & time: 12/22/23     Chief Complaint   Seizures   History of Present Illness   Dominic Carter is a 68 y.o. year-old male presents to the ED with chief complaint of seizure.  States that the seizure occurred around 8 am this morning.  States that he feels well now.  States that he has hx of seizures.  States that he normally has a seizure about once every 2 months.  States that he hasn't had any of his seizure meds since being at the hospital.  States that he did take his morning dose.  He takes Keppra  500mg  BID.  SABRA  History provided by patient.   Review of Systems  Pertinent positive and negative review of systems noted in HPI.    Physical Exam   Vitals:   12/22/23 1555 12/22/23 2042  BP: (!) 148/83 (!) 128/59  Pulse: 76 79  Resp: 16 16  Temp: 98.5 F (36.9 C) 99.2 F (37.3 C)  SpO2: 98% 100%    CONSTITUTIONAL:  non toxic-appearing, NAD NEURO:  Alert and oriented x 3, CN 3-12 grossly intact EYES:  eyes equal and reactive ENT/NECK:  Supple, no stridor  CARDIO:  normal rate, regular rhythm, appears well-perfused  PULM:  No respiratory distress, CTAB GI/GU:  non-distended,  MSK/SPINE:  No gross deformities, no edema, moves all extremities  SKIN:  no rash, atraumatic   *Additional and/or pertinent findings included in MDM below  Diagnostic and Interventional Summary    EKG Interpretation Date/Time:    Ventricular Rate:    PR Interval:    QRS Duration:    QT Interval:    QTC Calculation:   R Axis:      Text Interpretation:         Labs Reviewed  COMPREHENSIVE METABOLIC PANEL WITH GFR - Abnormal; Notable for the following components:      Result Value   Calcium  8.5 (*)    Albumin 3.4 (*)    All other components within normal limits  CBC WITH DIFFERENTIAL/PLATELET - Abnormal; Notable for the following components:   Hemoglobin 12.0 (*)    HCT 37.9 (*)     RDW 16.8 (*)    Platelets 127 (*)    All other components within normal limits  URINALYSIS, ROUTINE W REFLEX MICROSCOPIC - Abnormal; Notable for the following components:   Protein, ur 30 (*)    All other components within normal limits  MAGNESIUM   RAPID URINE DRUG SCREEN, HOSP PERFORMED  CBG MONITORING, ED    No orders to display    Medications  levETIRAcetam  (KEPPRA ) tablet 500 mg (has no administration in time range)     Procedures  /  Critical Care Procedures  ED Course and Medical Decision Making  I have reviewed the triage vital signs, the nursing notes, and pertinent available records from the EMR.  Social Determinants Affecting Complexity of Care: Patient .   ED Course:    Medical Decision Making Patient came to the emergency department this morning after having had a seizure.  He has known history of seizures.  He takes Keppra .  He states that he only had a single seizure.  He states that he normally has about 1 seizure every other month.  He states that he is back to his baseline now.  States that he feels fine.  Labs and workup are reassuring.  Vital signs are stable.  Patient  is nontoxic-appearing.  I have treated patient with a dose of his evening dose of home Keppra .  Will discharge home with PCP follow-up.  Risk Prescription drug management.         Consultants: No consultations were needed in caring for this patient.   Treatment and Plan: I considered admission due to patient's initial presentation, but after considering the examination and diagnostic results, patient will not require admission and can be discharged with outpatient follow-up.    Final Clinical Impressions(s) / ED Diagnoses     ICD-10-CM   1. Seizure (HCC)  R56.9       ED Discharge Orders     None         Discharge Instructions Discussed with and Provided to Patient:   Discharge Instructions   None      Vicky Charleston, PA-C 12/22/23 2234    Jerrol Agent,  MD 12/23/23 0120

## 2023-12-23 NOTE — ED Notes (Signed)
 Pt provided buss pass to return home and waiting in lobby upon dc to return home. A&Ox4
